# Patient Record
Sex: Female | Born: 1935 | Race: White | Hispanic: No | Smoking: Former smoker
Health system: Southern US, Community
[De-identification: ages and names within clinical notes are randomized; demographics above are authoritative.]

## PROBLEM LIST (undated history)

## (undated) DIAGNOSIS — Z860100 Personal history of colon polyps, unspecified: Secondary | ICD-10-CM

## (undated) DIAGNOSIS — I25810 Atherosclerosis of coronary artery bypass graft(s) without angina pectoris: Secondary | ICD-10-CM

## (undated) DIAGNOSIS — I10 Essential (primary) hypertension: Secondary | ICD-10-CM

## (undated) DIAGNOSIS — E039 Hypothyroidism, unspecified: Secondary | ICD-10-CM

## (undated) DIAGNOSIS — E785 Hyperlipidemia, unspecified: Secondary | ICD-10-CM

## (undated) DIAGNOSIS — K644 Residual hemorrhoidal skin tags: Secondary | ICD-10-CM

## (undated) DIAGNOSIS — K449 Diaphragmatic hernia without obstruction or gangrene: Secondary | ICD-10-CM

## (undated) DIAGNOSIS — K219 Gastro-esophageal reflux disease without esophagitis: Secondary | ICD-10-CM

## (undated) DIAGNOSIS — I4891 Unspecified atrial fibrillation: Secondary | ICD-10-CM

## (undated) DIAGNOSIS — F419 Anxiety disorder, unspecified: Secondary | ICD-10-CM

## (undated) DIAGNOSIS — G4701 Insomnia due to medical condition: Secondary | ICD-10-CM

## (undated) DIAGNOSIS — K2270 Barrett's esophagus without dysplasia: Secondary | ICD-10-CM

## (undated) HISTORY — DX: Essential (primary) hypertension: I10

## (undated) HISTORY — DX: Atherosclerosis of coronary artery bypass graft(s) without angina pectoris: I25.810

## (undated) HISTORY — PX: CAROTID ENDARTERECTOMY: SUR193

## (undated) HISTORY — DX: Unspecified atrial fibrillation: I48.91

## (undated) HISTORY — DX: Gastro-esophageal reflux disease without esophagitis: K21.9

## (undated) HISTORY — DX: Hyperlipidemia, unspecified: E78.5

## (undated) HISTORY — DX: Personal history of colonic polyps: Z86.010

## (undated) HISTORY — DX: Diaphragmatic hernia without obstruction or gangrene: K44.9

## (undated) HISTORY — DX: Insomnia due to medical condition: G47.01

## (undated) HISTORY — DX: Personal history of colon polyps, unspecified: Z86.0100

## (undated) HISTORY — DX: Hypothyroidism, unspecified: E03.9

## (undated) HISTORY — PX: CATARACT EXTRACTION: SUR2

## (undated) HISTORY — DX: Barrett's esophagus without dysplasia: K22.70

## (undated) HISTORY — DX: Anxiety disorder, unspecified: F41.9

## (undated) HISTORY — DX: Residual hemorrhoidal skin tags: K64.4

---

## 1993-04-20 DIAGNOSIS — I25810 Atherosclerosis of coronary artery bypass graft(s) without angina pectoris: Secondary | ICD-10-CM

## 1993-04-20 HISTORY — DX: Atherosclerosis of coronary artery bypass graft(s) without angina pectoris: I25.810

## 1994-01-05 ENCOUNTER — Encounter: Payer: Self-pay | Admitting: Gastroenterology

## 1995-04-21 HISTORY — PX: CORONARY ARTERY BYPASS GRAFT: SHX141

## 1996-02-25 HISTORY — PX: CARDIAC CATHETERIZATION: SHX172

## 1997-12-19 ENCOUNTER — Other Ambulatory Visit: Admission: RE | Admit: 1997-12-19 | Discharge: 1997-12-19 | Payer: Self-pay | Admitting: Gastroenterology

## 1998-04-20 ENCOUNTER — Encounter: Payer: Self-pay | Admitting: Emergency Medicine

## 1998-04-20 ENCOUNTER — Inpatient Hospital Stay (HOSPITAL_COMMUNITY): Admission: EM | Admit: 1998-04-20 | Discharge: 1998-04-26 | Payer: Self-pay | Admitting: Emergency Medicine

## 1998-04-20 ENCOUNTER — Encounter: Payer: Self-pay | Admitting: Internal Medicine

## 1998-10-08 ENCOUNTER — Encounter: Admission: RE | Admit: 1998-10-08 | Discharge: 1998-10-28 | Payer: Self-pay | Admitting: Internal Medicine

## 1998-11-26 ENCOUNTER — Ambulatory Visit (HOSPITAL_COMMUNITY): Admission: RE | Admit: 1998-11-26 | Discharge: 1998-11-26 | Payer: Self-pay | Admitting: Internal Medicine

## 2000-05-14 ENCOUNTER — Emergency Department (HOSPITAL_COMMUNITY): Admission: EM | Admit: 2000-05-14 | Discharge: 2000-05-15 | Payer: Self-pay | Admitting: Internal Medicine

## 2000-06-05 ENCOUNTER — Encounter: Payer: Self-pay | Admitting: Emergency Medicine

## 2000-06-05 ENCOUNTER — Inpatient Hospital Stay (HOSPITAL_COMMUNITY): Admission: EM | Admit: 2000-06-05 | Discharge: 2000-06-09 | Payer: Self-pay | Admitting: Emergency Medicine

## 2000-06-06 ENCOUNTER — Encounter: Payer: Self-pay | Admitting: Pulmonary Disease

## 2000-06-08 ENCOUNTER — Encounter: Payer: Self-pay | Admitting: Endocrinology

## 2000-12-23 ENCOUNTER — Encounter: Admission: RE | Admit: 2000-12-23 | Discharge: 2000-12-23 | Payer: Self-pay | Admitting: Family Medicine

## 2000-12-23 ENCOUNTER — Encounter: Payer: Self-pay | Admitting: Family Medicine

## 2002-06-26 ENCOUNTER — Encounter: Payer: Self-pay | Admitting: Internal Medicine

## 2002-06-26 ENCOUNTER — Encounter: Admission: RE | Admit: 2002-06-26 | Discharge: 2002-06-26 | Payer: Self-pay | Admitting: Internal Medicine

## 2003-05-11 ENCOUNTER — Encounter: Admission: RE | Admit: 2003-05-11 | Discharge: 2003-05-11 | Payer: Self-pay | Admitting: Internal Medicine

## 2003-05-21 ENCOUNTER — Inpatient Hospital Stay (HOSPITAL_COMMUNITY): Admission: EM | Admit: 2003-05-21 | Discharge: 2003-05-22 | Payer: Self-pay | Admitting: Emergency Medicine

## 2003-05-21 HISTORY — PX: CARDIAC CATHETERIZATION: SHX172

## 2004-02-19 ENCOUNTER — Ambulatory Visit: Payer: Self-pay | Admitting: Internal Medicine

## 2004-03-12 ENCOUNTER — Ambulatory Visit: Payer: Self-pay | Admitting: Internal Medicine

## 2004-04-04 ENCOUNTER — Ambulatory Visit: Payer: Self-pay | Admitting: Internal Medicine

## 2004-05-09 ENCOUNTER — Ambulatory Visit: Payer: Self-pay | Admitting: Internal Medicine

## 2004-08-11 ENCOUNTER — Ambulatory Visit: Payer: Self-pay | Admitting: Internal Medicine

## 2004-08-22 ENCOUNTER — Ambulatory Visit: Payer: Self-pay | Admitting: Internal Medicine

## 2004-11-20 ENCOUNTER — Emergency Department (HOSPITAL_COMMUNITY): Admission: EM | Admit: 2004-11-20 | Discharge: 2004-11-20 | Payer: Self-pay | Admitting: Emergency Medicine

## 2004-12-01 ENCOUNTER — Ambulatory Visit: Payer: Self-pay | Admitting: Internal Medicine

## 2005-02-26 ENCOUNTER — Ambulatory Visit: Payer: Self-pay | Admitting: Internal Medicine

## 2005-03-03 ENCOUNTER — Ambulatory Visit: Payer: Self-pay | Admitting: Internal Medicine

## 2005-05-14 ENCOUNTER — Ambulatory Visit: Payer: Self-pay | Admitting: Internal Medicine

## 2005-05-28 ENCOUNTER — Emergency Department (HOSPITAL_COMMUNITY): Admission: EM | Admit: 2005-05-28 | Discharge: 2005-05-28 | Payer: Self-pay | Admitting: Emergency Medicine

## 2005-06-18 HISTORY — PX: COLONOSCOPY W/ POLYPECTOMY: SHX1380

## 2005-06-22 ENCOUNTER — Ambulatory Visit: Payer: Self-pay | Admitting: Gastroenterology

## 2005-07-13 ENCOUNTER — Ambulatory Visit: Payer: Self-pay | Admitting: Gastroenterology

## 2005-07-13 ENCOUNTER — Encounter (INDEPENDENT_AMBULATORY_CARE_PROVIDER_SITE_OTHER): Payer: Self-pay | Admitting: *Deleted

## 2005-08-13 ENCOUNTER — Ambulatory Visit: Payer: Self-pay | Admitting: Internal Medicine

## 2005-11-12 ENCOUNTER — Ambulatory Visit: Payer: Self-pay | Admitting: Internal Medicine

## 2006-01-01 ENCOUNTER — Ambulatory Visit: Payer: Self-pay | Admitting: Internal Medicine

## 2006-01-08 ENCOUNTER — Ambulatory Visit: Payer: Self-pay | Admitting: Internal Medicine

## 2006-02-11 ENCOUNTER — Ambulatory Visit: Payer: Self-pay | Admitting: Internal Medicine

## 2006-03-15 ENCOUNTER — Ambulatory Visit: Payer: Self-pay | Admitting: Internal Medicine

## 2006-04-08 ENCOUNTER — Ambulatory Visit: Payer: Self-pay | Admitting: Internal Medicine

## 2006-05-13 ENCOUNTER — Ambulatory Visit: Payer: Self-pay | Admitting: Internal Medicine

## 2006-05-17 ENCOUNTER — Emergency Department (HOSPITAL_COMMUNITY): Admission: EM | Admit: 2006-05-17 | Discharge: 2006-05-18 | Payer: Self-pay | Admitting: Emergency Medicine

## 2006-06-15 ENCOUNTER — Ambulatory Visit: Payer: Self-pay | Admitting: Internal Medicine

## 2006-08-09 ENCOUNTER — Ambulatory Visit: Payer: Self-pay | Admitting: Internal Medicine

## 2006-08-09 LAB — CONVERTED CEMR LAB
Total Bilirubin: 0.8 mg/dL (ref 0.3–1.2)
Triglycerides: 122 mg/dL (ref 0–149)
VLDL: 24 mg/dL (ref 0–40)

## 2006-08-16 ENCOUNTER — Ambulatory Visit: Payer: Self-pay | Admitting: Internal Medicine

## 2006-10-18 ENCOUNTER — Ambulatory Visit: Payer: Self-pay | Admitting: Internal Medicine

## 2007-01-12 DIAGNOSIS — F411 Generalized anxiety disorder: Secondary | ICD-10-CM | POA: Insufficient documentation

## 2007-01-12 DIAGNOSIS — E785 Hyperlipidemia, unspecified: Secondary | ICD-10-CM | POA: Insufficient documentation

## 2007-01-12 DIAGNOSIS — E039 Hypothyroidism, unspecified: Secondary | ICD-10-CM | POA: Insufficient documentation

## 2007-01-17 ENCOUNTER — Ambulatory Visit: Payer: Self-pay | Admitting: Internal Medicine

## 2007-01-17 DIAGNOSIS — G4700 Insomnia, unspecified: Secondary | ICD-10-CM | POA: Insufficient documentation

## 2007-01-17 DIAGNOSIS — M542 Cervicalgia: Secondary | ICD-10-CM

## 2007-02-16 ENCOUNTER — Ambulatory Visit: Payer: Self-pay | Admitting: Internal Medicine

## 2007-03-15 ENCOUNTER — Ambulatory Visit: Payer: Self-pay | Admitting: Internal Medicine

## 2007-03-15 DIAGNOSIS — M719 Bursopathy, unspecified: Secondary | ICD-10-CM

## 2007-03-15 DIAGNOSIS — M67919 Unspecified disorder of synovium and tendon, unspecified shoulder: Secondary | ICD-10-CM | POA: Insufficient documentation

## 2007-04-05 ENCOUNTER — Encounter: Payer: Self-pay | Admitting: Internal Medicine

## 2007-04-08 ENCOUNTER — Telehealth: Payer: Self-pay | Admitting: Internal Medicine

## 2007-04-22 ENCOUNTER — Encounter: Payer: Self-pay | Admitting: Internal Medicine

## 2007-04-27 ENCOUNTER — Encounter: Payer: Self-pay | Admitting: Internal Medicine

## 2007-05-04 ENCOUNTER — Encounter: Payer: Self-pay | Admitting: Internal Medicine

## 2007-06-13 ENCOUNTER — Ambulatory Visit: Payer: Self-pay | Admitting: Internal Medicine

## 2007-06-13 DIAGNOSIS — M545 Low back pain: Secondary | ICD-10-CM

## 2007-06-22 ENCOUNTER — Encounter: Payer: Self-pay | Admitting: Internal Medicine

## 2007-08-01 ENCOUNTER — Ambulatory Visit: Payer: Self-pay | Admitting: Gastroenterology

## 2007-08-12 ENCOUNTER — Telehealth: Payer: Self-pay | Admitting: Internal Medicine

## 2007-09-08 ENCOUNTER — Ambulatory Visit: Payer: Self-pay | Admitting: Internal Medicine

## 2007-09-08 LAB — CONVERTED CEMR LAB
Free T4: 1.2 ng/dL (ref 0.6–1.6)
T3 Uptake Ratio: 39.1 % — ABNORMAL HIGH (ref 22.5–37.0)
TSH: 0.71 microintl units/mL (ref 0.35–5.50)

## 2007-09-19 ENCOUNTER — Encounter: Payer: Self-pay | Admitting: Internal Medicine

## 2007-09-22 ENCOUNTER — Encounter: Payer: Self-pay | Admitting: Internal Medicine

## 2007-10-05 ENCOUNTER — Telehealth: Payer: Self-pay | Admitting: *Deleted

## 2007-10-20 ENCOUNTER — Encounter: Payer: Self-pay | Admitting: Internal Medicine

## 2007-11-30 ENCOUNTER — Telehealth: Payer: Self-pay | Admitting: Internal Medicine

## 2007-12-08 ENCOUNTER — Ambulatory Visit: Payer: Self-pay | Admitting: Internal Medicine

## 2008-01-20 ENCOUNTER — Ambulatory Visit: Payer: Self-pay | Admitting: Internal Medicine

## 2008-02-12 ENCOUNTER — Emergency Department (HOSPITAL_COMMUNITY): Admission: EM | Admit: 2008-02-12 | Discharge: 2008-02-12 | Payer: Self-pay | Admitting: Emergency Medicine

## 2008-03-07 ENCOUNTER — Ambulatory Visit: Payer: Self-pay | Admitting: Internal Medicine

## 2008-03-07 DIAGNOSIS — S42209A Unspecified fracture of upper end of unspecified humerus, initial encounter for closed fracture: Secondary | ICD-10-CM | POA: Insufficient documentation

## 2008-04-03 ENCOUNTER — Inpatient Hospital Stay (HOSPITAL_COMMUNITY): Admission: EM | Admit: 2008-04-03 | Discharge: 2008-04-06 | Payer: Self-pay | Admitting: Emergency Medicine

## 2008-04-03 ENCOUNTER — Ambulatory Visit: Payer: Self-pay | Admitting: Internal Medicine

## 2008-04-26 ENCOUNTER — Ambulatory Visit: Payer: Self-pay | Admitting: Internal Medicine

## 2008-05-01 ENCOUNTER — Ambulatory Visit: Payer: Self-pay | Admitting: Internal Medicine

## 2008-05-01 DIAGNOSIS — R300 Dysuria: Secondary | ICD-10-CM

## 2008-05-03 ENCOUNTER — Encounter: Payer: Self-pay | Admitting: Internal Medicine

## 2008-05-07 LAB — CONVERTED CEMR LAB
T3, Free: 2.8 pg/mL (ref 2.3–4.2)
TSH: 30.37 microintl units/mL — ABNORMAL HIGH (ref 0.35–5.50)

## 2008-05-15 ENCOUNTER — Encounter: Payer: Self-pay | Admitting: Internal Medicine

## 2008-05-30 ENCOUNTER — Ambulatory Visit: Payer: Self-pay | Admitting: Internal Medicine

## 2008-05-30 LAB — CONVERTED CEMR LAB
Free T4: 0.9 ng/dL (ref 0.6–1.6)
T3, Free: 3.5 pg/mL (ref 2.3–4.2)

## 2008-06-13 ENCOUNTER — Ambulatory Visit: Payer: Self-pay | Admitting: Gastroenterology

## 2008-06-13 DIAGNOSIS — K2270 Barrett's esophagus without dysplasia: Secondary | ICD-10-CM

## 2008-06-20 ENCOUNTER — Ambulatory Visit: Payer: Self-pay | Admitting: Gastroenterology

## 2008-06-20 ENCOUNTER — Encounter: Payer: Self-pay | Admitting: Gastroenterology

## 2008-06-24 ENCOUNTER — Encounter: Payer: Self-pay | Admitting: Gastroenterology

## 2008-06-27 ENCOUNTER — Telehealth: Payer: Self-pay | Admitting: Gastroenterology

## 2008-08-01 ENCOUNTER — Ambulatory Visit: Payer: Self-pay | Admitting: Internal Medicine

## 2008-09-07 ENCOUNTER — Telehealth: Payer: Self-pay | Admitting: Internal Medicine

## 2008-10-29 ENCOUNTER — Ambulatory Visit: Payer: Self-pay | Admitting: Internal Medicine

## 2008-10-29 DIAGNOSIS — R000 Tachycardia, unspecified: Secondary | ICD-10-CM

## 2008-11-16 ENCOUNTER — Ambulatory Visit: Payer: Self-pay | Admitting: Family Medicine

## 2008-11-22 ENCOUNTER — Encounter: Payer: Self-pay | Admitting: Internal Medicine

## 2009-01-02 ENCOUNTER — Encounter: Payer: Self-pay | Admitting: Internal Medicine

## 2009-01-10 ENCOUNTER — Encounter: Payer: Self-pay | Admitting: Internal Medicine

## 2009-01-29 ENCOUNTER — Ambulatory Visit: Payer: Self-pay | Admitting: Internal Medicine

## 2009-01-31 ENCOUNTER — Telehealth: Payer: Self-pay | Admitting: Internal Medicine

## 2009-02-20 ENCOUNTER — Telehealth: Payer: Self-pay | Admitting: Internal Medicine

## 2009-02-25 ENCOUNTER — Ambulatory Visit: Payer: Self-pay | Admitting: Internal Medicine

## 2009-03-11 ENCOUNTER — Telehealth: Payer: Self-pay | Admitting: Internal Medicine

## 2009-04-08 ENCOUNTER — Ambulatory Visit: Payer: Self-pay | Admitting: Internal Medicine

## 2009-05-10 ENCOUNTER — Telehealth: Payer: Self-pay | Admitting: Internal Medicine

## 2009-05-13 ENCOUNTER — Encounter: Payer: Self-pay | Admitting: Internal Medicine

## 2009-06-04 ENCOUNTER — Encounter: Payer: Self-pay | Admitting: Internal Medicine

## 2009-07-08 ENCOUNTER — Ambulatory Visit: Payer: Self-pay | Admitting: Internal Medicine

## 2009-07-08 DIAGNOSIS — B029 Zoster without complications: Secondary | ICD-10-CM | POA: Insufficient documentation

## 2009-08-08 ENCOUNTER — Emergency Department (HOSPITAL_COMMUNITY): Admission: EM | Admit: 2009-08-08 | Discharge: 2009-08-08 | Payer: Self-pay | Admitting: Family Medicine

## 2009-08-08 ENCOUNTER — Telehealth: Payer: Self-pay | Admitting: Internal Medicine

## 2009-08-23 ENCOUNTER — Telehealth: Payer: Self-pay | Admitting: Internal Medicine

## 2009-10-07 ENCOUNTER — Ambulatory Visit: Payer: Self-pay | Admitting: Internal Medicine

## 2009-10-07 DIAGNOSIS — R350 Frequency of micturition: Secondary | ICD-10-CM

## 2009-10-07 LAB — CONVERTED CEMR LAB
Cholesterol, target level: 200 mg/dL
Cholesterol: 139 mg/dL (ref 0–200)
LDL Goal: 100 mg/dL

## 2009-10-08 ENCOUNTER — Encounter: Payer: Self-pay | Admitting: Internal Medicine

## 2009-10-11 ENCOUNTER — Telehealth: Payer: Self-pay | Admitting: Internal Medicine

## 2009-10-14 ENCOUNTER — Encounter: Payer: Self-pay | Admitting: Internal Medicine

## 2009-10-17 ENCOUNTER — Telehealth: Payer: Self-pay | Admitting: Internal Medicine

## 2009-11-14 ENCOUNTER — Telehealth: Payer: Self-pay | Admitting: Internal Medicine

## 2009-12-04 ENCOUNTER — Encounter: Payer: Self-pay | Admitting: Internal Medicine

## 2009-12-11 ENCOUNTER — Telehealth: Payer: Self-pay | Admitting: Internal Medicine

## 2010-01-08 ENCOUNTER — Ambulatory Visit: Payer: Self-pay | Admitting: Internal Medicine

## 2010-01-16 ENCOUNTER — Telehealth: Payer: Self-pay | Admitting: Internal Medicine

## 2010-01-23 ENCOUNTER — Telehealth: Payer: Self-pay | Admitting: Internal Medicine

## 2010-01-27 ENCOUNTER — Encounter: Payer: Self-pay | Admitting: Internal Medicine

## 2010-01-28 ENCOUNTER — Ambulatory Visit: Payer: Self-pay | Admitting: Family Medicine

## 2010-01-28 DIAGNOSIS — R05 Cough: Secondary | ICD-10-CM | POA: Insufficient documentation

## 2010-01-28 DIAGNOSIS — R059 Cough, unspecified: Secondary | ICD-10-CM | POA: Insufficient documentation

## 2010-01-28 DIAGNOSIS — J209 Acute bronchitis, unspecified: Secondary | ICD-10-CM | POA: Insufficient documentation

## 2010-01-29 ENCOUNTER — Telehealth (INDEPENDENT_AMBULATORY_CARE_PROVIDER_SITE_OTHER): Payer: Self-pay | Admitting: *Deleted

## 2010-05-06 ENCOUNTER — Ambulatory Visit
Admission: RE | Admit: 2010-05-06 | Discharge: 2010-05-06 | Payer: Self-pay | Source: Home / Self Care | Attending: Internal Medicine | Admitting: Internal Medicine

## 2010-05-06 ENCOUNTER — Encounter: Payer: Self-pay | Admitting: Internal Medicine

## 2010-05-06 ENCOUNTER — Telehealth: Payer: Self-pay | Admitting: Internal Medicine

## 2010-05-06 DIAGNOSIS — N390 Urinary tract infection, site not specified: Secondary | ICD-10-CM | POA: Insufficient documentation

## 2010-05-06 LAB — CONVERTED CEMR LAB
Bilirubin Urine: NEGATIVE
Ketones, urine, test strip: NEGATIVE
Protein, U semiquant: NEGATIVE
pH: 6

## 2010-05-07 ENCOUNTER — Encounter: Payer: Self-pay | Admitting: Internal Medicine

## 2010-05-12 ENCOUNTER — Ambulatory Visit
Admission: RE | Admit: 2010-05-12 | Discharge: 2010-05-12 | Payer: Self-pay | Source: Home / Self Care | Attending: Internal Medicine | Admitting: Internal Medicine

## 2010-05-15 ENCOUNTER — Encounter: Payer: Self-pay | Admitting: Internal Medicine

## 2010-05-15 HISTORY — PX: OTHER SURGICAL HISTORY: SHX169

## 2010-05-22 NOTE — Assessment & Plan Note (Signed)
Summary: 3 month fup/ccm   Vital Signs:  Patient profile:   77 year old female Height:      64 inches Weight:      150 pounds BMI:     25.84 Temp:     98.2 degrees F oral Pulse rate:   72 / minute Resp:     14 per minute BP sitting:   134 / 80  (left arm)  Vitals Entered By: Willy Eddy, LPN (October 07, 2009 2:58 PM) CC: roa, Hypertension Management, Lipid Management   Primary Care Provider:  Darryll Capers, MD  CC:  roa, Hypertension Management, and Lipid Management.  History of Present Illness: no chest pains or dizziness blood pressure is stable arthritis is stable but has chronic low back pain mild abdominal pain with bend increased urgency and nocturia 2-3 times at night  Hypertension History:      She denies headache, chest pain, palpitations, dyspnea with exertion, orthopnea, PND, peripheral edema, visual symptoms, neurologic problems, syncope, and side effects from treatment.        Positive major cardiovascular risk factors include female age 21 years old or older, hyperlipidemia, and hypertension.  Negative major cardiovascular risk factors include non-tobacco-user status.        Positive history for target organ damage include ASHD (either angina/prior MI/prior CABG).  Further assessment for target organ damage reveals no history of stroke/TIA or peripheral vascular disease.    Lipid Management History:      Positive NCEP/ATP III risk factors include female age 65 years old or older, HDL cholesterol less than 40, hypertension, and ASHD (either angina/prior MI/prior CABG).  Negative NCEP/ATP III risk factors include non-tobacco-user status, no prior stroke/TIA, no peripheral vascular disease, and no history of aortic aneurysm.      Preventive Screening-Counseling & Management  Alcohol-Tobacco     Smoking Status: never  Problems Prior to Update: 1)  Shingles, Recurrent  (ICD-053.9) 2)  Tachycardia  (ICD-785.0) 3)  Barrett's Esophagus  (ICD-530.85) 4)   Dysuria  (ICD-788.1) 5)  Closed Fracture Unspec Part Upper End Humerus  (ICD-812.00) 6)  Low Back Pain  (ICD-724.2) 7)  Bursitis, Acromioclavicular, Right  (ICD-726.10) 8)  Insomnia, Chronic  (ICD-307.42) 9)  Neck Pain, Right  (ICD-723.1) 10)  Hypothyroidism  (ICD-244.9) 11)  Hypertension  (ICD-401.9) 12)  Hyperlipidemia  (ICD-272.4) 13)  Anxiety  (ICD-300.00)  Current Problems (verified): 1)  Shingles, Recurrent  (ICD-053.9) 2)  Tachycardia  (ICD-785.0) 3)  Barrett's Esophagus  (ICD-530.85) 4)  Dysuria  (ICD-788.1) 5)  Closed Fracture Unspec Part Upper End Humerus  (ICD-812.00) 6)  Low Back Pain  (ICD-724.2) 7)  Bursitis, Acromioclavicular, Right  (ICD-726.10) 8)  Insomnia, Chronic  (ICD-307.42) 9)  Neck Pain, Right  (ICD-723.1) 10)  Hypothyroidism  (ICD-244.9) 11)  Hypertension  (ICD-401.9) 12)  Hyperlipidemia  (ICD-272.4) 13)  Anxiety  (ICD-300.00)  Medications Prior to Update: 1)  Prilosec 20 Mg  Cpdr (Omeprazole) .... Once Daily 2)  Lipitor 80 Mg  Tabs (Atorvastatin Calcium) .... At Bedtime 3)  Synthroid 88 Mcg  Tabs (Levothyroxine Sodium) .Marland Kitchen.. 1 Tab Before Breakfast 4)  Adult Aspirin Ec Low Strength 81 Mg  Tbec (Aspirin) .... Once Daily 5)  Klonopin 1 Mg  Tabs (Clonazepam) .... 1/2 Po Q 6 Pm and Then One Whole Klonopin At Bedtime 6)  Azor 10-40 Mg  Tabs (Amlodipine-Olmesartan) .... 1/2 By Mouth Daily 7)  Calcium 600/vitamin D 600-200 Mg-Unit  Tabs (Calcium Carbonate-Vitamin D) .... Once Daily 8)  Vitamin B-12  500 Mcg  Tabs (Cyanocobalamin) .... Once Daily 9)  Bl Vitamin E 400 Unit  Caps (Vitamin E) .... Once Daily 10)  Therapeutic Multivitamin   Tabs (Multiple Vitamin) .... Once Daily 11)  Remeron 30 Mg Tabs (Mirtazapine) .... One By Mouth Q Hs 12)  Bisoprolol Fumarate 5 Mg Tabs (Bisoprolol Fumarate) .... One By Mouth Daily 13)  Seroquel 50 Mg Tabs (Quetiapine Fumarate) .... One By Mouth Q Hs  Current Medications (verified): 1)  Prilosec 20 Mg  Cpdr (Omeprazole) ....  Once Daily 2)  Lipitor 80 Mg  Tabs (Atorvastatin Calcium) .... At Bedtime 3)  Synthroid 88 Mcg  Tabs (Levothyroxine Sodium) .Marland Kitchen.. 1 Tab Before Breakfast 4)  Adult Aspirin Ec Low Strength 81 Mg  Tbec (Aspirin) .... Once Daily 5)  Klonopin 1 Mg  Tabs (Clonazepam) .... 1/2 Po Q 6 Pm and Then One Whole Klonopin At Bedtime 6)  Azor 10-40 Mg  Tabs (Amlodipine-Olmesartan) .... 1/2 By Mouth Daily 7)  Calcium 600/vitamin D 600-200 Mg-Unit  Tabs (Calcium Carbonate-Vitamin D) .... Once Daily 8)  Vitamin B-12 500 Mcg  Tabs (Cyanocobalamin) .... Once Daily 9)  Bl Vitamin E 400 Unit  Caps (Vitamin E) .... Once Daily 10)  Therapeutic Multivitamin   Tabs (Multiple Vitamin) .... Once Daily 11)  Remeron 30 Mg Tabs (Mirtazapine) .... One By Mouth Q Hs 12)  Bisoprolol Fumarate 5 Mg Tabs (Bisoprolol Fumarate) .... One By Mouth Daily 13)  Seroquel 50 Mg Tabs (Quetiapine Fumarate) .... One By Mouth Q Hs  Rx Number [id number]  Allergies (verified): 1)  ! Demerol 2)  ! Tylox 3)  ! Codeine  Past History:  Family History: Last updated: 06/13/2008 Family History High cholesterol Family History Hypertension No FH of Colon Cancer: Family History of Diabetes: Son  Social History: Last updated: 06/13/2008 Married Never Smoked widowed Alcohol Use - no Daily Caffeine Use 1 can Pepsi in AM Illicit Drug Use - no  Risk Factors: Smoking Status: never (10/07/2009)  Past medical, surgical, family and social histories (including risk factors) reviewed, and no changes noted (except as noted below).  Past Medical History: Reviewed history from 06/13/2008 and no changes required. Insomnia Anxiety Hyperlipidemia Hypertension Hypothyroidism Low back pain L wrist fracture, 09/2007 R shoulder fracture, 02/2008 GERD Barretts Esophagus Hemorrhoids Coronary Artery Disease Hiatal hernia Hyperplastic colon polyps 06/2005  Past Surgical History: Reviewed history from 01/17/2007 and no changes required. Coronary  artery bypass graft  1995 Carotid endarterectomy Cataract extraction  Family History: Reviewed history from 06/13/2008 and no changes required. Family History High cholesterol Family History Hypertension No FH of Colon Cancer: Family History of Diabetes: Son  Social History: Reviewed history from 06/13/2008 and no changes required. Married Never Smoked widowed Alcohol Use - no Daily Caffeine Use 1 can Pepsi in AM Illicit Drug Use - no  Review of Systems  The patient denies anorexia, fever, weight loss, weight gain, vision loss, decreased hearing, hoarseness, chest pain, syncope, dyspnea on exertion, peripheral edema, prolonged cough, headaches, hemoptysis, abdominal pain, melena, hematochezia, severe indigestion/heartburn, hematuria, incontinence, genital sores, muscle weakness, suspicious skin lesions, transient blindness, difficulty walking, depression, unusual weight change, abnormal bleeding, enlarged lymph nodes, angioedema, and breast masses.    Physical Exam  General:  patient is alert and cooperative and in no distress Head:  Normocephalic and atraumatic. Eyes:  pupils equal and pupils round.   Ears:  R ear normal and L ear normal.   Nose:  minimal clear rhinorrhea Mouth:  Oral mucosa and oropharynx without  lesions or exudates.  Teeth in good repair. Neck:  supple Lungs:  clear to auscultationno wheezes.   Heart:  normal rate and regular rhythm.   Abdomen:  soft and non-tender.     Impression & Recommendations:  Problem # 1:  DYSURIA (ICD-788.1)  Encouraged to push clear liquids, get enough rest, and take acetaminophen as needed. To be seen in 10 days if no improvement, sooner if worse.  Orders: Specimen Handling (16109) UA Dipstick w/o Micro (automated)  (81003)  Problem # 2:  SHINGLES, RECURRENT (ICD-053.9) review of blood work  Problem # 3:  HYPERTENSION (ICD-401.9)  Her updated medication list for this problem includes:    Azor 10-40 Mg Tabs  (Amlodipine-olmesartan) .Marland Kitchen... 1/2 by mouth daily    Bisoprolol Fumarate 5 Mg Tabs (Bisoprolol fumarate) ..... One by mouth daily  BP today: 134/80 Prior BP: 130/64 (07/08/2009)  Prior 10 Yr Risk Heart Disease: N/A (08/01/2008)  Labs Reviewed: Chol: 124 (08/09/2006)   HDL: 36.5 (08/09/2006)   LDL: 63 (08/09/2006)   TG: 122 (08/09/2006)  Problem # 4:  HYPOTHYROIDISM (ICD-244.9)  Her updated medication list for this problem includes:    Synthroid 88 Mcg Tabs (Levothyroxine sodium) .Marland Kitchen... 1 tab before breakfast  Labs Reviewed: TSH: 5.62 (05/30/2008)   Free T4: 0.9 (05/30/2008)    Chol: 124 (08/09/2006)   HDL: 36.5 (08/09/2006)   LDL: 63 (08/09/2006)   TG: 122 (08/09/2006)  Orders: TLB-TSH (Thyroid Stimulating Hormone) (84443-TSH) Venipuncture (60454)  Problem # 5:  HYPERLIPIDEMIA (ICD-272.4) Assessment: Unchanged  Her updated medication list for this problem includes:    Lipitor 80 Mg Tabs (Atorvastatin calcium) .Marland Kitchen... At bedtime  Orders: TLB-Cholesterol, HDL (83718-HDL) TLB-Cholesterol, Direct LDL (83721-DIRLDL) TLB-Cholesterol, Total (82465-CHO)  Labs Reviewed: SGOT: 26 (08/09/2006)   SGPT: 12 (08/09/2006)  Lipid Goals: Chol Goal: 200 (10/07/2009)   HDL Goal: 40 (10/07/2009)   LDL Goal: 100 (10/07/2009)   TG Goal: 150 (10/07/2009)  Prior 10 Yr Risk Heart Disease: N/A (08/01/2008)   HDL:36.5 (08/09/2006)  LDL:63 (08/09/2006)  Chol:124 (08/09/2006)  Trig:122 (08/09/2006)  Problem # 6:  INSOMNIA, CHRONIC (ICD-307.42)  Complete Medication List: 1)  Prilosec 20 Mg Cpdr (Omeprazole) .... Once daily 2)  Lipitor 80 Mg Tabs (Atorvastatin calcium) .... At bedtime 3)  Synthroid 88 Mcg Tabs (Levothyroxine sodium) .Marland Kitchen.. 1 tab before breakfast 4)  Adult Aspirin Ec Low Strength 81 Mg Tbec (Aspirin) .... Once daily 5)  Klonopin 1 Mg Tabs (Clonazepam) .... 1/2 po q 6 pm and then one whole klonopin at bedtime 6)  Azor 10-40 Mg Tabs (Amlodipine-olmesartan) .... 1/2 by mouth daily 7)   Calcium 600/vitamin D 600-200 Mg-unit Tabs (Calcium carbonate-vitamin d) .... Once daily 8)  Vitamin B-12 500 Mcg Tabs (Cyanocobalamin) .... Once daily 9)  Bl Vitamin E 400 Unit Caps (Vitamin e) .... Once daily 10)  Therapeutic Multivitamin Tabs (Multiple vitamin) .... Once daily 11)  Remeron 30 Mg Tabs (Mirtazapine) .... One by mouth q hs 12)  Bisoprolol Fumarate 5 Mg Tabs (Bisoprolol fumarate) .... One by mouth daily 13)  Seroquel 50 Mg Tabs (Quetiapine fumarate) .... One by mouth q hs  rx number [id number]  Hypertension Assessment/Plan:      The patient's hypertensive risk group is category C: Target organ damage and/or diabetes.  Today's blood pressure is 134/80.  Her blood pressure goal is < 140/90.  Lipid Assessment/Plan:      Based on NCEP/ATP III, the patient's risk factor category is "history of coronary disease, peripheral vascular disease, cerebrovascular disease,  or aortic aneurysm".  The patient's lipid goals are as follows: Total cholesterol goal is 200; LDL cholesterol goal is 100; HDL cholesterol goal is 40; Triglyceride goal is 150.    Patient Instructions: 1)  Please schedule a follow-up appointment in 3 months. Prescriptions: SEROQUEL 50 MG TABS (QUETIAPINE FUMARATE) one by mouth q HS  rx number [id number]  #90 x 3   Entered and Authorized by:   Stacie Glaze MD   Signed by:   Stacie Glaze MD on 10/07/2009   Method used:   Faxed to ...       [facility])       [address] [address]       [place], [place]  [address]       Ph: [phone]       Fax: [phone]   RxID:   [id number]   Appended Document: Orders Update    Clinical Lists Changes  Medications: Added new medication of CIPRO 250 MG TABS (CIPROFLOXACIN HCL) 1 two times a day for 10 days - Signed Rx of CIPRO 250 MG TABS (CIPROFLOXACIN HCL) 1 two times a day for 10 days;  #20 x 0;  Signed;  Entered by: Willy Eddy, LPN;  Authorized by: Stacie Glaze MD;  Method used: Electronically to [facility]  [facility] [id number] [place], [place], [place]  [address], Ph: [phone], Fax: [phone] Orders: Added new Test order of T-Culture, Urine [id number]) - Signed Observations: Added new observation of COMMENTS: Wynona Canes, CMA  October 07, 2009 3:42 PM  (10/07/2009 15:41) Added new observation of PH URINE: 5.0  (10/07/2009 15:41) Added new observation of SPEC GR URIN: <1.005  (10/07/2009 15:41) Added new observation of APPEARANCE U: Clear  (10/07/2009 15:41) Added new observation of UA COLOR: yellow  (10/07/2009 15:41) Added new observation of WBC DIPSTK U: 2+  (10/07/2009 15:41) Added new observation of NITRITE URN: positive  (10/07/2009 15:41) Added new observation of UROBILINOGEN: 0.2  (10/07/2009 15:41) Added new observation of PROTEIN, URN: negative  (10/07/2009 15:41) Added new observation of BLOOD UR DIP: trace-lysed  (10/07/2009 15:41) Added new observation of KETONES URN: negative  (10/07/2009 15:41) Added new observation of BILIRUBIN UR: negative  (10/07/2009 15:41) Added new observation of GLUCOSE, URN: negative  (10/07/2009 15:41)    Prescriptions: CIPRO 250 MG TABS (CIPROFLOXACIN HCL) 1 two times a day for 10 days  #20 x 0   Entered by:   Willy Eddy, LPN   Authorized by:   Stacie Glaze MD   Signed by:   Willy Eddy, LPN on 01/60/1093   Method used:   Electronically to        [name] [id number] (retail)       [address]       [place], [place]  [address]       Ph: [phone]       Fax: [phone]   RxID:   [id number]    Laboratory Results   Urine Tests  Date/Time Recieved: October 07, 2009 3:42 PM  Date/Time Reported: October 07, 2009 3:42 PM   Routine Urinalysis   Color: yellow Appearance: Clear Glucose: negative   (Normal Range: Negative) Bilirubin: negative   (Normal Range: Negative) Ketone: negative   (Normal Range: Negative) Spec. Gravity: <1.005   (Normal Range:  1.003-1.035) Blood: trace-lysed   (Normal Range: Negative) pH: 5.0   (Normal Range:  5.0-8.0) Protein: negative   (Normal Range: Negative) Urobilinogen: 0.2   (Normal Range: 0-1) Nitrite: positive   (Normal Range: Negative) Leukocyte Esterace: 2+   (Normal Range: Negative)    Comments: Wynona Canes, CMA  October 07, 2009 3:42 PM

## 2010-05-22 NOTE — Progress Notes (Signed)
Summary: refill  Phone Note Refill Request Call back at Home Phone [phone] Message from:  daughter---live call  Refills Requested: Medication #1:  AZOR 10-40 MG  TABS 1/2 by mouth daily [facility]  Initial call taken by: Warnell Forester,  January 23, 2010 1:02 PM    Prescriptions: AZOR 10-40 MG  TABS (AMLODIPINE-OLMESARTAN) 1/2 by mouth daily  #45 x 3   Entered by:   Willy Eddy, LPN   Authorized by:   Stacie Glaze MD   Signed by:   Willy Eddy, LPN on 14/78/2956   Method used:   Electronically to        [facility] [address] (mail-order)       [facility]       [address]       [place], [place]  [address]       Ph: [phone]       Fax: [phone]   RxID:   [id number]

## 2010-05-22 NOTE — Medication Information (Signed)
Summary: Formulary Alternative Request Form-Azor  Formulary Alternative Request Form-Azor   Imported By: Maryln Gottron 01/30/2010 10:58:17  _____________________________________________________________________  External Attachment:    Type:   Image     Comment:   External Document

## 2010-05-22 NOTE — Progress Notes (Signed)
Summary: refill today please  Phone Note Refill Request Call back at Home Phone [phone] Message from:  Patient--live call  Refills Requested: Medication #1:  KLONOPIN 1 MG  TABS 1/2 po q 6 pm and then one whole klonopin at bedtime   Brand Name Necessary? No Need 30 day supply until mail order comes in. [name]  Initial call taken by: Warnell Forester,  October 17, 2009 2:31 PM    Prescriptions: KLONOPIN 1 MG  TABS (CLONAZEPAM) 1/2 po q 6 pm and then one whole klonopin at bedtime  #45 x 3   Entered by:   Willy Eddy, LPN   Authorized by:   Stacie Glaze MD   Signed by:   Willy Eddy, LPN on 09/81/1914   Method used:   Telephoned to ...       [name] [id number] (retail)       [address] [place], [place]  [address]       Ph: [phone]       Fax: [phone]   RxID:   [id number]

## 2010-05-22 NOTE — Progress Notes (Signed)
Summary: refill  Phone Note Call from Patient Call back at Home Phone [phone]   Caller: Patient-live call Reason for Call: Refill Medication Complaint: Breathing Problems Summary of Call: refill generic remeron. [facility]. Initial call taken by: Warnell Forester,  May 10, 2009 2:48 PM    Prescriptions: REMERON 30 MG TABS (MIRTAZAPINE) one by mouth q HS  #90 x 3   Entered by:   Willy Eddy, LPN   Authorized by:   Stacie Glaze MD   Signed by:   Willy Eddy, LPN on 09/81/1914   Method used:   Faxed to ...       [facility]             , [place]         Ph: [phone]       Fax: [phone]   RxID:   [id number]

## 2010-05-22 NOTE — Medication Information (Signed)
Summary: Clonazepam Approved  Clonazepam Approved   Imported By: Maryln Gottron 10/24/2009 15:46:27  _____________________________________________________________________  External Attachment:    Type:   Image     Comment:   External Document

## 2010-05-22 NOTE — Assessment & Plan Note (Signed)
Summary: 3 MTH ROV // RS   Vital Signs:  Patient profile:   77 year old female Height:      64 inches Weight:      152 pounds BMI:     26.19 Temp:     98.2 degrees F oral Pulse rate:   721 / minute Resp:     14 per minute BP sitting:   122 / 74  (left arm)  Vitals Entered By: Willy Eddy, LPN (January 08, 2010 2:22 PM) CC: roa, Hypertension Management, Lipid Management Is Patient Diabetic? No   Primary Care Provider:  Darryll Capers, MD  CC:  roa, Hypertension Management, and Lipid Management.  History of Present Illness: the pt was seen by cardiology at [facility] and has an EKG and review of medications  Got flu shot and pneumonia shot today review of HTN and Cad AS WELL AS LIPIDS lIPIDS ARE AT GOAL  Hypertension History:      She denies headache, chest pain, palpitations, dyspnea with exertion, orthopnea, PND, peripheral edema, visual symptoms, neurologic problems, syncope, and side effects from treatment.        Positive major cardiovascular risk factors include female age 15 years old or older, hyperlipidemia, and hypertension.  Negative major cardiovascular risk factors include non-tobacco-user status.        Positive history for target organ damage include ASHD (either angina/prior MI/prior CABG).  Further assessment for target organ damage reveals no history of stroke/TIA or peripheral vascular disease.    Lipid Management History:      Positive NCEP/ATP III risk factors include female age 15 years old or older, hypertension, and ASHD (either angina/prior MI/prior CABG).  Negative NCEP/ATP III risk factors include non-tobacco-user status, no prior stroke/TIA, no peripheral vascular disease, and no history of aortic aneurysm.      Preventive Screening-Counseling & Management  Alcohol-Tobacco     Smoking Status: never     Tobacco Counseling: not indicated; no tobacco use  Problems Prior to Update: 1)  Frequency, Urinary  (ICD-788.41) 2)  Shingles,  Recurrent  (ICD-053.9) 3)  Tachycardia  (ICD-785.0) 4)  Barrett's Esophagus  (ICD-530.85) 5)  Dysuria  (ICD-788.1) 6)  Closed Fracture Unspec Part Upper End Humerus  (ICD-812.00) 7)  Low Back Pain  (ICD-724.2) 8)  Bursitis, Acromioclavicular, Right  (ICD-726.10) 9)  Insomnia, Chronic  (ICD-307.42) 10)  Neck Pain, Right  (ICD-723.1) 11)  Hypothyroidism  (ICD-244.9) 12)  Hypertension  (ICD-401.9) 13)  Hyperlipidemia  (ICD-272.4) 14)  Anxiety  (ICD-300.00)  Current Problems (verified): 1)  Frequency, Urinary  (ICD-788.41) 2)  Shingles, Recurrent  (ICD-053.9) 3)  Tachycardia  (ICD-785.0) 4)  Barrett's Esophagus  (ICD-530.85) 5)  Dysuria  (ICD-788.1) 6)  Closed Fracture Unspec Part Upper End Humerus  (ICD-812.00) 7)  Low Back Pain  (ICD-724.2) 8)  Bursitis, Acromioclavicular, Right  (ICD-726.10) 9)  Insomnia, Chronic  (ICD-307.42) 10)  Neck Pain, Right  (ICD-723.1) 11)  Hypothyroidism  (ICD-244.9) 12)  Hypertension  (ICD-401.9) 13)  Hyperlipidemia  (ICD-272.4) 14)  Anxiety  (ICD-300.00)  Medications Prior to Update: 1)  Prilosec 20 Mg  Cpdr (Omeprazole) .... Once Daily 2)  Lipitor 80 Mg  Tabs (Atorvastatin Calcium) .... At Bedtime 3)  Synthroid 88 Mcg  Tabs (Levothyroxine Sodium) .Marland Kitchen.. 1 Tab Before Breakfast 4)  Adult Aspirin Ec Low Strength 81 Mg  Tbec (Aspirin) .... Once Daily 5)  Klonopin 1 Mg  Tabs (Clonazepam) .... 1/2 Po Q 6 Pm and Then One Whole Klonopin At Bedtime 6)  Azor 10-40 Mg  Tabs (Amlodipine-Olmesartan) .... 1/2 By Mouth Daily 7)  Calcium 600/vitamin D 600-200 Mg-Unit  Tabs (Calcium Carbonate-Vitamin D) .... Once Daily 8)  Vitamin B-12 500 Mcg  Tabs (Cyanocobalamin) .... Once Daily 9)  Bl Vitamin E 400 Unit  Caps (Vitamin E) .... Once Daily 10)  Therapeutic Multivitamin   Tabs (Multiple Vitamin) .... Once Daily 11)  Remeron 30 Mg Tabs (Mirtazapine) .... One By Mouth Q Hs 12)  Bisoprolol Fumarate 5 Mg Tabs (Bisoprolol Fumarate) .... One By Mouth Daily 13)  Seroquel  50 Mg Tabs (Quetiapine Fumarate) .... One By Mouth Q Hs  Rx Number [id number] 14)  Cipro 250 Mg Tabs (Ciprofloxacin Hcl) .Marland Kitchen.. 1 Once Daily  For 30 Days 15)  Zithromax Z-Pak 250 Mg Tabs (Azithromycin) .... Take As Directed 16)  Atuss Ds 30-4-30 Mg/83ml Susp (Pseudoephed Hcl-Cpm-Dm Hbr Tan) .... 2 Tsp Every 12 Hours  Current Medications (verified): 1)  Prilosec 20 Mg  Cpdr (Omeprazole) .... Once Daily 2)  Lipitor 80 Mg  Tabs (Atorvastatin Calcium) .... At Bedtime 3)  Synthroid 88 Mcg  Tabs (Levothyroxine Sodium) .Marland Kitchen.. 1 Tab Before Breakfast 4)  Adult Aspirin Ec Low Strength 81 Mg  Tbec (Aspirin) .... Once Daily 5)  Klonopin 1 Mg  Tabs (Clonazepam) .... 1/2 Po Q 6 Pm and Then One Whole Klonopin At Bedtime 6)  Azor 10-40 Mg  Tabs (Amlodipine-Olmesartan) .... 1/2 By Mouth Daily 7)  Calcium 600/vitamin D 600-200 Mg-Unit  Tabs (Calcium Carbonate-Vitamin D) .... Once Daily 8)  Vitamin B-12 500 Mcg  Tabs (Cyanocobalamin) .... Once Daily 9)  Bl Vitamin E 400 Unit  Caps (Vitamin E) .... Once Daily 10)  Therapeutic Multivitamin   Tabs (Multiple Vitamin) .... Once Daily 11)  Remeron 30 Mg Tabs (Mirtazapine) .... One By Mouth Q Hs 12)  Bisoprolol Fumarate 5 Mg Tabs (Bisoprolol Fumarate) .... One By Mouth Daily 13)  Seroquel 50 Mg Tabs (Quetiapine Fumarate) .... One By Mouth Q Hs  Rx Number [id number]  Allergies (verified): 1)  ! Demerol 2)  ! Tylox 3)  ! Codeine  Past History:  Family History: Last updated: 06/13/2008 Family History High cholesterol Family History Hypertension No FH of Colon Cancer: Family History of Diabetes: Son  Social History: Last updated: 06/13/2008 Married Never Smoked widowed Alcohol Use - no Daily Caffeine Use 1 can Pepsi in AM Illicit Drug Use - no  Risk Factors: Smoking Status: never (01/08/2010)  Past medical, surgical, family and social histories (including risk factors) reviewed, and no changes noted (except as noted below).  Past Medical  History: Reviewed history from 06/13/2008 and no changes required. Insomnia Anxiety Hyperlipidemia Hypertension Hypothyroidism Low back pain L wrist fracture, 09/2007 R shoulder fracture, 02/2008 GERD Barretts Esophagus Hemorrhoids Coronary Artery Disease Hiatal hernia Hyperplastic colon polyps 06/2005  Past Surgical History: Reviewed history from 01/17/2007 and no changes required. Coronary artery bypass graft  1995 Carotid endarterectomy Cataract extraction  Family History: Reviewed history from 06/13/2008 and no changes required. Family History High cholesterol Family History Hypertension No FH of Colon Cancer: Family History of Diabetes: Son  Social History: Reviewed history from 06/13/2008 and no changes required. Married Never Smoked widowed Alcohol Use - no Daily Caffeine Use 1 can Pepsi in AM Illicit Drug Use - no  Review of Systems  The patient denies anorexia, fever, weight loss, weight gain, vision loss, decreased hearing, hoarseness, chest pain, syncope, dyspnea on exertion, peripheral edema, prolonged cough, headaches, hemoptysis, abdominal pain, melena, hematochezia, severe indigestion/heartburn, hematuria,  incontinence, genital sores, muscle weakness, suspicious skin lesions, transient blindness, difficulty walking, depression, unusual weight change, abnormal bleeding, enlarged lymph nodes, angioedema, and breast masses.         Flu Vaccine Consent Questions     Do you have a history of severe allergic reactions to this vaccine? no    Any prior history of allergic reactions to egg and/or gelatin? no    Do you have a sensitivity to the preservative Thimersol? no    Do you have a past history of Guillan-Barre Syndrome? no    Do you currently have an acute febrile illness? no    Have you ever had a severe reaction to latex? no    Vaccine information given and explained to patient? yes    Are you currently pregnant? no    Lot Number:[id number]   Exp  Date:10/18/2010   Site Given  Left Deltoid IM   Physical Exam  General:  patient is alert and cooperative and in no distress Head:  Normocephalic and atraumatic. Eyes:  pupils equal and pupils round.   Ears:  R ear normal and L ear normal.   Nose:  minimal clear rhinorrhea Mouth:  Oral mucosa and oropharynx without lesions or exudates.  Teeth in good repair. Neck:  supple Lungs:  clear to auscultationno wheezes.   Heart:  normal rate and regular rhythm.   Abdomen:  soft and non-tender.   Msk:  decreased ROM, joint tenderness, and joint swelling.   Extremities:  No clubbing, cyanosis, edema, or deformity noted with normal full range of motion of all joints.   Neurologic:  alert & oriented X3 and gait normal.     Impression & Recommendations:  Problem # 1:  BARRETT'S ESOPHAGUS (ICD-530.85) Assessment Unchanged stable no dyspahgia  Problem # 2:  HYPOTHYROIDISM (ICD-244.9) Assessment: Unchanged  Her updated medication list for this problem includes:    Synthroid 88 Mcg Tabs (Levothyroxine sodium) .Marland Kitchen... 1 tab before breakfast  Labs Reviewed: TSH: 1.28 (10/07/2009)   Free T4: 0.9 (05/30/2008)    Chol: 139 (10/07/2009)   HDL: 46.50 (10/07/2009)   LDL: 63 (08/09/2006)   TG: 122 (08/09/2006)  Problem # 3:  HYPERTENSION (ICD-401.9) Assessment: Unchanged  Her updated medication list for this problem includes:    Azor 10-40 Mg Tabs (Amlodipine-olmesartan) .Marland Kitchen... 1/2 by mouth daily    Bisoprolol Fumarate 5 Mg Tabs (Bisoprolol fumarate) ..... One by mouth daily  BP today: 122/74 Prior BP: 134/80 (10/07/2009)  Prior 10 Yr Risk Heart Disease: N/A (08/01/2008)  Labs Reviewed: Chol: 139 (10/07/2009)   HDL: 46.50 (10/07/2009)   LDL: 63 (08/09/2006)   TG: 122 (08/09/2006)  Problem # 4:  HYPERLIPIDEMIA (ICD-272.4) Assessment: Unchanged  Her updated medication list for this problem includes:    Lipitor 80 Mg Tabs (Atorvastatin calcium) .Marland Kitchen... At bedtime  Labs Reviewed: SGOT: 26  (08/09/2006)   SGPT: 12 (08/09/2006)  Lipid Goals: Chol Goal: 200 (10/07/2009)   HDL Goal: 40 (10/07/2009)   LDL Goal: 100 (10/07/2009)   TG Goal: 150 (10/07/2009)  Prior 10 Yr Risk Heart Disease: N/A (08/01/2008)   HDL:46.50 (10/07/2009), 36.5 (08/09/2006)  LDL:63 (08/09/2006)  Chol:139 (10/07/2009), 124 (08/09/2006)  Trig:122 (08/09/2006)  Complete Medication List: 1)  Prilosec 20 Mg Cpdr (Omeprazole) .... Once daily 2)  Lipitor 80 Mg Tabs (Atorvastatin calcium) .... At bedtime 3)  Synthroid 88 Mcg Tabs (Levothyroxine sodium) .Marland Kitchen.. 1 tab before breakfast 4)  Adult Aspirin Ec Low Strength 81 Mg Tbec (Aspirin) .... Once daily 5)  Klonopin 1 Mg Tabs (Clonazepam) .... 1/2 po q 6 pm and then one whole klonopin at bedtime 6)  Azor 10-40 Mg Tabs (Amlodipine-olmesartan) .... 1/2 by mouth daily 7)  Calcium 600/vitamin D 600-200 Mg-unit Tabs (Calcium carbonate-vitamin d) .... Once daily 8)  Vitamin B-12 500 Mcg Tabs (Cyanocobalamin) .... Once daily 9)  Bl Vitamin E 400 Unit Caps (Vitamin e) .... Once daily 10)  Therapeutic Multivitamin Tabs (Multiple vitamin) .... Once daily 11)  Remeron 30 Mg Tabs (Mirtazapine) .... One by mouth q hs 12)  Bisoprolol Fumarate 5 Mg Tabs (Bisoprolol fumarate) .... One by mouth daily 13)  Seroquel 50 Mg Tabs (Quetiapine fumarate) .... One by mouth q hs  rx number [id number]  Other Orders: Flu Vaccine 58yrs + MEDICARE PATIENTS [phone]) Administration Flu vaccine - MCR (D6644) Pneumococcal Vaccine (03474) Admin 1st Vaccine (25956)  Hypertension Assessment/Plan:      The patient's hypertensive risk group is category C: Target organ damage and/or diabetes.  Today's blood pressure is 122/74.  Her blood pressure goal is < 140/90.  Lipid Assessment/Plan:      Based on NCEP/ATP III, the patient's risk factor category is "history of coronary disease, peripheral vascular disease, cerebrovascular disease, or aortic aneurysm".  The patient's lipid goals are as follows: Total  cholesterol goal is 200; LDL cholesterol goal is 100; HDL cholesterol goal is 40; Triglyceride goal is 150.  Her LDL cholesterol goal has been met.    Patient Instructions: 1)  Please schedule a follow-up appointment in 4 months. Fasting   Immunizations Administered:  Pneumonia Vaccine:    Vaccine Type: Pneumovax (Medicare)    Site: right deltoid    Mfr: Merck    Dose: 0.5 ml    Route: IM    Given by: Willy Eddy, LPN    Exp. Date: 05/11/2011    Lot #: [id number]    VIS given: 03/25/09 version given January 08, 2010.

## 2010-05-22 NOTE — Assessment & Plan Note (Signed)
Summary: possible uti/bmw   Primary Care Provider:  Darryll Capers, MD   History of Present Illness: 77  -year-old patient with a two day history of burning dysuria and urinary frequency ; denies any fever or chills.  She has a history of hypertension and dyslipidemia which have been stable.  No other concerns or complaints.  Denies any flank pain  Allergies: 1)  ! Demerol 2)  ! Tylox 3)  ! Codeine  Past History:  Past Medical History: Reviewed history from 06/13/2008 and no changes required. Insomnia Anxiety Hyperlipidemia Hypertension Hypothyroidism Low back pain L wrist fracture, 09/2007 R shoulder fracture, 02/2008 GERD Barretts Esophagus Hemorrhoids Coronary Artery Disease Hiatal hernia Hyperplastic colon polyps 06/2005  Review of Systems       The patient complains of anorexia and abdominal pain.  The patient denies fever, weight loss, weight gain, vision loss, decreased hearing, hoarseness, chest pain, syncope, dyspnea on exertion, peripheral edema, prolonged cough, headaches, hemoptysis, melena, hematochezia, severe indigestion/heartburn, hematuria, incontinence, genital sores, muscle weakness, suspicious skin lesions, transient blindness, difficulty walking, depression, unusual weight change, abnormal bleeding, enlarged lymph nodes, angioedema, and breast masses.    Physical Exam  General:  Well-developed,well-nourished,in no acute distress; alert,appropriate and cooperative throughout examination Head:  Normocephalic and atraumatic without obvious abnormalities. No apparent alopecia or balding. Ears:  External ear exam shows no significant lesions or deformities.  Otoscopic examination reveals clear canals, tympanic membranes are intact bilaterally without bulging, retraction, inflammation or discharge. Hearing is grossly normal bilaterally. Mouth:  Oral mucosa and oropharynx without lesions or exudates.  Teeth in good repair. Neck:  No deformities, masses, or  tenderness noted. Lungs:  Normal respiratory effort, chest expands symmetrically. Lungs are clear to auscultation, no crackles or wheezes. Heart:  Normal rate and regular rhythm. S1 and S2 normal without gallop, murmur, click, rub or other extra sounds. Abdomen:  Bowel sounds positive,abdomen soft and non-tender without masses, organomegaly or hernias noted.   Impression & Recommendations:  Problem # 1:  FREQUENCY, URINARY (ICD-788.41)  Problem # 2:  DYSURIA (ICD-788.1)  The following medications were removed from the medication list:    Ceftin 250 Mg Tabs (Cefuroxime axetil) .Marland Kitchen... 1 tab by mouth two times a day x 10 days Her updated medication list for this problem includes:    Ciprofloxacin Hcl 500 Mg Tabs (Ciprofloxacin hcl) ..... One twice daily  Orders: UA Dipstick w/o Micro (automated)  (81003) T-Culture, Urine ([id number]) Specimen Handling (09811)  Problem # 3:  HYPERTENSION (ICD-401.9)  Her updated medication list for this problem includes:    Azor 10-40 Mg Tabs (Amlodipine-olmesartan) .Marland Kitchen... 1/2 by mouth daily    Bisoprolol Fumarate 5 Mg Tabs (Bisoprolol fumarate) ..... One by mouth daily  Problem # 4:  UTI (ICD-599.0)  The following medications were removed from the medication list:    Ceftin 250 Mg Tabs (Cefuroxime axetil) .Marland Kitchen... 1 tab by mouth two times a day x 10 days Her updated medication list for this problem includes:    Ciprofloxacin Hcl 500 Mg Tabs (Ciprofloxacin hcl) ..... One twice daily  Complete Medication List: 1)  Prilosec 20 Mg Cpdr (Omeprazole) .... Once daily 2)  Lipitor 80 Mg Tabs (Atorvastatin calcium) .... At bedtime 3)  Synthroid 88 Mcg Tabs (Levothyroxine sodium) .Marland Kitchen.. 1 tab before breakfast 4)  Adult Aspirin Ec Low Strength 81 Mg Tbec (Aspirin) .... Once daily 5)  Klonopin 1 Mg Tabs (Clonazepam) .... 1/2 po q 6 pm and then one whole klonopin at bedtime 6)  Azor  10-40 Mg Tabs (Amlodipine-olmesartan) .... 1/2 by mouth daily 7)  Calcium  600/vitamin D 600-200 Mg-unit Tabs (Calcium carbonate-vitamin d) .... Once daily 8)  Vitamin B-12 500 Mcg Tabs (Cyanocobalamin) .... Once daily 9)  Bl Vitamin E 400 Unit Caps (Vitamin e) .... Once daily 10)  Therapeutic Multivitamin Tabs (Multiple vitamin) .... Once daily 11)  Remeron 30 Mg Tabs (Mirtazapine) .... One by mouth q hs 12)  Bisoprolol Fumarate 5 Mg Tabs (Bisoprolol fumarate) .... One by mouth daily 13)  Seroquel 50 Mg Tabs (Quetiapine fumarate) .... One by mouth q hs  rx number [id number] 14)  Qvar 80 Mcg/act Aers (Beclomethasone dipropionate) .... 2 puffs by mouth two times a day 15)  Proventil Hfa 108 (90 Base) Mcg/act Aers (Albuterol sulfate) .Marland Kitchen.. 1-2 puffs by mouth q 6 hours as needed sob/cough/wheeze 16)  Tessalon Perles 100 Mg Caps (Benzonatate) .Marland Kitchen.. 1 cap by mouth three times a day as needed cough 17)  Align 4 Mg Caps (Probiotic product) .Marland Kitchen.. 1 cap by mouth once daily for 30 days 18)  Ciprofloxacin Hcl 500 Mg Tabs (Ciprofloxacin hcl) .... One twice daily  Patient Instructions: 1)  Please schedule a follow-up appointment as needed. 2)  Drink as much fluid as you can tolerate for the next few days. 3)  Take your antibiotic as prescribed until ALL of it is gone, but stop if you develop a rash or swelling and contact our office as soon as possible. Prescriptions: CIPROFLOXACIN HCL 500 MG TABS (CIPROFLOXACIN HCL) one twice daily  #14 x 0   Entered and Authorized by:   Gordy Savers  MD   Signed by:   Gordy Savers  MD on 05/06/2010   Method used:   Print then Give to Patient   RxID:   [id number]    Orders Added: 1)  UA Dipstick w/o Micro (automated)  [81003] 2)  T-Culture, Urine [[id number]] 3)  Specimen Handling [99000] 4)  Est. Patient Level III [99213]    Laboratory Results   Urine Tests  Date/Time Recieved: May 06, 2010 3:35 PM  Date/Time Reported: May 06, 2010 3:35 PM   Routine Urinalysis   Color: yellow Appearance:  Clear Glucose: negative   (Normal Range: Negative) Bilirubin: negative   (Normal Range: Negative) Ketone: negative   (Normal Range: Negative) Spec. Gravity: 1.010   (Normal Range: 1.003-1.035) Blood: 1+   (Normal Range: Negative) pH: 6.0   (Normal Range: 5.0-8.0) Protein: negative   (Normal Range: Negative) Urobilinogen: 0.2   (Normal Range: 0-1) Nitrite: negative   (Normal Range: Negative) Leukocyte Esterace: 3+   (Normal Range: Negative)    Comments: Wynona Canes, CMA  May 06, 2010 3:35 PM

## 2010-05-22 NOTE — Assessment & Plan Note (Signed)
Summary: COUGH/CONGESTION/NJR   Vital Signs:  Patient profile:   77 year old female Height:      64 inches (162.56 cm) Weight:      146 pounds (66.36 kg) O2 Sat:      94 % on Room air Temp:     97.5 degrees F (36.39 degrees C) oral Pulse rate:   92 / minute BP sitting:   120 / 78  (left arm) Cuff size:   regular  Vitals Entered By: Josph Macho RMA (January 28, 2010 3:16 PM)  O2 Flow:  Room air  Serial Vital Signs/Assessments:                                PEF    PreRx  PostRx Time      O2 Sat  O2 Type     L/min  L/min  L/min   By           96  %   Room air                          Danise Edge MD  CC: Cough and congestion X2 weeks/ pt took zpak on 01/16/10 so should have been done on the 3rd of October/ CF Is Patient Diabetic? No   History of Present Illness: Patient has been struggling with coughing congestion for several weeks now. And for a routine evaluation in early September received her flu and pneumonia shots and within a few days was struggling with nasal congestion cough malaise fevers and started a Z-Pak on 929 2011 to a 5 day course and the fevers and chills resolved. The rhinorrhea lessened but did not completely resolve it is usually clear and occasionally green. On her cough is persisting keeping her up at night and causing her difficulty throughout the day it is wet and productive. She is wheezing on occasion presently no fevers,struggling with excessive fatigue and weakness no chills chills no edema, heartburn, nausea, vomiting, diarrhea, palpitations, headache. She is  Current Medications (verified): 1)  Prilosec 20 Mg  Cpdr (Omeprazole) .... Once Daily 2)  Lipitor 80 Mg  Tabs (Atorvastatin Calcium) .... At Bedtime 3)  Synthroid 88 Mcg  Tabs (Levothyroxine Sodium) .Marland Kitchen.. 1 Tab Before Breakfast 4)  Adult Aspirin Ec Low Strength 81 Mg  Tbec (Aspirin) .... Once Daily 5)  Klonopin 1 Mg  Tabs (Clonazepam) .... 1/2 Po Q 6 Pm and Then One Whole Klonopin At Bedtime 6)   Azor 10-40 Mg  Tabs (Amlodipine-Olmesartan) .... 1/2 By Mouth Daily 7)  Calcium 600/vitamin D 600-200 Mg-Unit  Tabs (Calcium Carbonate-Vitamin D) .... Once Daily 8)  Vitamin B-12 500 Mcg  Tabs (Cyanocobalamin) .... Once Daily 9)  Bl Vitamin E 400 Unit  Caps (Vitamin E) .... Once Daily 10)  Therapeutic Multivitamin   Tabs (Multiple Vitamin) .... Once Daily 11)  Remeron 30 Mg Tabs (Mirtazapine) .... One By Mouth Q Hs 12)  Bisoprolol Fumarate 5 Mg Tabs (Bisoprolol Fumarate) .... One By Mouth Daily 13)  Seroquel 50 Mg Tabs (Quetiapine Fumarate) .... One By Mouth Q Hs  Rx Number [id number]  Allergies (verified): 1)  ! Demerol 2)  ! Tylox 3)  ! Codeine  Past History:  Past medical history reviewed for relevance to current acute and chronic problems. Social history (including risk factors) reviewed for relevance to current acute and chronic problems.  Past Medical History: Reviewed  history from 06/13/2008 and no changes required. Insomnia Anxiety Hyperlipidemia Hypertension Hypothyroidism Low back pain L wrist fracture, 09/2007 R shoulder fracture, 02/2008 GERD Barretts Esophagus Hemorrhoids Coronary Artery Disease Hiatal hernia Hyperplastic colon polyps 06/2005  Social History: Reviewed history from 06/13/2008 and no changes required. Married Never Smoked widowed Alcohol Use - no Daily Caffeine Use 1 can Pepsi in AM Illicit Drug Use - no  Review of Systems      See HPI  Physical Exam  General:  Well-developed,well-nourished,in no acute distress; alert,appropriate and cooperative throughout examination Head:  Normocephalic and atraumatic without obvious abnormalities. No apparent alopecia or balding. Ears:  External ear exam shows no significant lesions or deformities.  Otoscopic examination reveals clear canals, tympanic membranes are intact bilaterally without bulging, retraction, inflammation or discharge. Hearing is grossly normal bilaterally.  Nose:  mucosal erythema  and mucosal edema.   Mouth:  Oral mucosa and oropharynx without lesions or exudates.  Teeth in good repair. Neck:  No deformities, masses, or tenderness noted. Lungs:  Normal respiratory effort, chest expands symmetrically. Lungs are clear to auscultation, no  wheezes. Slightly decreased BS b/l bases Heart:  Normal rate and regular rhythm. S1 and S2 normal without gallop, murmur, click, rub or other extra sounds. Abdomen:  Bowel sounds positive,abdomen soft and non-tender without masses, organomegaly or hernias noted. Msk:  No deformity or scoliosis noted of thoracic or lumbar spine.   Extremities:  No clubbing, cyanosis, edema, or deformity noted with normal full range of motion of all joints.   Neurologic:  No cranial nerve deficits noted. Station and gait are normal. Plantar reflexes are down-going bilaterally. DTRs are symmetrical throughout. Sensory, motor and coordinative functions appear intact. Cervical Nodes:  No lymphadenopathy noted Psych:  Cognition and judgment appear intact. Alert and cooperative with normal attention span and concentration. No apparent delusions, illusions, hallucinations   Impression & Recommendations:  Problem # 1:  ACUTE BRONCHITIS (ICD-466.0)  The following medications were removed from the medication list:    Zithromax Z-pak 250 Mg Tabs (Azithromycin) .Marland Kitchen... Take as directed    Atuss Ds 30-4-30 Mg/36ml Susp (Pseudoephed hcl-cpm-dm hbr tan) .Marland Kitchen... 2 tsp every 12 horus Her updated medication list for this problem includes:    Qvar 80 Mcg/act Aers (Beclomethasone dipropionate) .Marland Kitchen... 2 puffs by mouth two times a day    Proventil Hfa 108 (90 Base) Mcg/act Aers (Albuterol sulfate) .Marland Kitchen... 1-2 puffs by mouth q 6 hours as needed sob/cough/wheeze    Tessalon Perles 100 Mg Caps (Benzonatate) .Marland Kitchen... 1 cap by mouth three times a day as needed cough    Ceftin 250 Mg Tabs (Cefuroxime axetil) .Marland Kitchen... 1 tab by mouth two times a day x 10 days Report persistent symptoms She does  have a distant h/o 2ppd habit and is exposed to some 2nd hand smoke  Problem # 2:  HYPERTENSION (ICD-401.9)  Her updated medication list for this problem includes:    Azor 10-40 Mg Tabs (Amlodipine-olmesartan) .Marland Kitchen... 1/2 by mouth daily    Bisoprolol Fumarate 5 Mg Tabs (Bisoprolol fumarate) ..... One by mouth daily Well controlled no change in therapy today  Problem # 3:  HYPERLIPIDEMIA (ICD-272.4)  Her updated medication list for this problem includes:    Lipitor 80 Mg Tabs (Atorvastatin calcium) .Marland Kitchen... At bedtime Tolerating meds no change in therapy  Complete Medication List: 1)  Prilosec 20 Mg Cpdr (Omeprazole) .... Once daily 2)  Lipitor 80 Mg Tabs (Atorvastatin calcium) .... At bedtime 3)  Synthroid 88 Mcg Tabs (Levothyroxine  sodium) .Marland Kitchen.. 1 tab before breakfast 4)  Adult Aspirin Ec Low Strength 81 Mg Tbec (Aspirin) .... Once daily 5)  Klonopin 1 Mg Tabs (Clonazepam) .... 1/2 po q 6 pm and then one whole klonopin at bedtime 6)  Azor 10-40 Mg Tabs (Amlodipine-olmesartan) .... 1/2 by mouth daily 7)  Calcium 600/vitamin D 600-200 Mg-unit Tabs (Calcium carbonate-vitamin d) .... Once daily 8)  Vitamin B-12 500 Mcg Tabs (Cyanocobalamin) .... Once daily 9)  Bl Vitamin E 400 Unit Caps (Vitamin e) .... Once daily 10)  Therapeutic Multivitamin Tabs (Multiple vitamin) .... Once daily 11)  Remeron 30 Mg Tabs (Mirtazapine) .... One by mouth q hs 12)  Bisoprolol Fumarate 5 Mg Tabs (Bisoprolol fumarate) .... One by mouth daily 13)  Seroquel 50 Mg Tabs (Quetiapine fumarate) .... One by mouth q hs  rx number [id number] 14)  Qvar 80 Mcg/act Aers (Beclomethasone dipropionate) .... 2 puffs by mouth two times a day 15)  Proventil Hfa 108 (90 Base) Mcg/act Aers (Albuterol sulfate) .Marland Kitchen.. 1-2 puffs by mouth q 6 hours as needed sob/cough/wheeze 16)  Tessalon Perles 100 Mg Caps (Benzonatate) .Marland Kitchen.. 1 cap by mouth three times a day as needed cough 17)  Ceftin 250 Mg Tabs (Cefuroxime axetil) .Marland Kitchen.. 1 tab by mouth two  times a day x 10 days 18)  Align 4 Mg Caps (Probiotic product) .Marland Kitchen.. 1 cap by mouth once daily for 30 days  Other Orders: T-2 View CXR (71020TC) Prescriptions: CEFTIN 250 MG TABS (CEFUROXIME AXETIL) 1 tab by mouth two times a day x 10 days  #20 x 0   Entered and Authorized by:   Danise Edge MD   Signed by:   Danise Edge MD on 01/28/2010   Method used:   Electronically to        [name] [id number] (retail)       [address]       [place], [place]  [address]       Ph: [phone]       Fax: [phone]   RxID:   [id number] TESSALON PERLES 100 MG CAPS (BENZONATATE) 1 cap by mouth three times a day as needed cough  #30 x 0   Entered and Authorized by:   Danise Edge MD   Signed by:   Danise Edge MD on 01/28/2010   Method used:   Electronically to        [name] [address] (retail)       [address] [place], [place]  [address]       Ph: [phone]       Fax: [phone]   RxID:   [id number] PROVENTIL HFA 108 (90 BASE) MCG/ACT AERS (ALBUTEROL SULFATE) 1-2 puffs by mouth q 6 hours as needed sob/cough/wheeze  #1 hfa x 2   Entered and Authorized by:   Danise Edge MD   Signed by:   Danise Edge MD on 01/28/2010   Method used:   Electronically to        [name] [address] (retail)       [address] [place], [place]  [address]       Ph: [phone]       Fax: [phone]   RxID:   [id number] QVAR  80 MCG/ACT AERS (BECLOMETHASONE DIPROPIONATE) 2 puffs by mouth two times a day  #1 hfa x 0   Entered and Authorized by:   Danise Edge MD   Signed by:   Danise Edge MD on 01/28/2010   Method used:   Samples Given   RxID:   [id number]

## 2010-05-22 NOTE — Progress Notes (Signed)
Summary: refill  Phone Note Refill Request Call back at Home Phone [phone] Message from:  Patient--live call  Refills Requested: Medication #1:  KLONOPIN 1 MG  TABS 1/2 po q 6 pm and then one whole klonopin at bedtime   Brand Name Necessary? No Call in #[facility] to [name] [address] until mail arrive.  Initial call taken by: Warnell Forester,  Aug 23, 2009 2:41 PM    Prescriptions: KLONOPIN 1 MG  TABS (CLONAZEPAM) 1/2 po q 6 pm and then one whole klonopin at bedtime  #10 x 0   Entered by:   Willy Eddy, LPN   Authorized by:   Stacie Glaze MD   Signed by:   Willy Eddy, LPN on 14/78/2956   Method used:   Telephoned to ...       [name] [id number] (retail)       [address] [place], [place]  [address]       Ph: [phone]       Fax: [phone]   RxID:   [id number]

## 2010-05-22 NOTE — Progress Notes (Signed)
Summary: cough  Phone Note Call from Patient Call back at Home Phone [phone]   Caller: Patient Call For: Stacie Glaze MD Summary of Call: Pt has cough started last night no fever and sore throat. Please call  cough med and abx  into [facility] [phone] Initial call taken by: Heron Sabins,  January 16, 2010 12:26 PM  Follow-up for Phone Call        per dr Lovell Sheehan- m ay have z pack and atuss- pt informed and sent in Follow-up by: Willy Eddy, LPN,  January 16, 2010 1:30 PM    New/Updated Medications: ZITHROMAX Z-PAK 250 MG TABS (AZITHROMYCIN) take as directed ATUSS DS 30-4-30 MG/5ML SUSP (PSEUDOEPHED HCL-CPM-DM HBR TAN) 2 tsp every 12 horus Prescriptions: ATUSS DS 30-4-30 MG/5ML SUSP (PSEUDOEPHED HCL-CPM-DM HBR TAN) 2 tsp every 12 horus  #6oz x 0   Entered by:   Willy Eddy, LPN   Authorized by:   Stacie Glaze MD   Signed by:   Willy Eddy, LPN on 47/82/9562   Method used:   Electronically to        [name] [address] (retail)       [address]       [place], [place]  [address]       Ph: [phone]       Fax: [phone]   RxID:   [id number] ZITHROMAX Z-PAK 250 MG TABS (AZITHROMYCIN) take as directed  #1 x 0   Entered by:   Willy Eddy, LPN   Authorized by:   Stacie Glaze MD   Signed by:   Willy Eddy, LPN on 59/56/3875   Method used:   Electronically to        [name] [address] (retail)       [address]       [place], [place]  [address]       Ph: [phone]       Fax: [phone]   RxID:   [id number]

## 2010-05-22 NOTE — Letter (Signed)
Summary: [facility]   Imported By: Maryln Gottron 06/11/2009 15:18:27  _____________________________________________________________________  External Attachment:    Type:   Image     Comment:   External Document

## 2010-05-22 NOTE — Assessment & Plan Note (Signed)
Summary: 3 month rov/njr   Vital Signs:  Patient profile:   77 year old female Height:      64 inches Weight:      150 pounds Temp:     98.2 degrees F oral Pulse rate:   76 / minute BP sitting:   130 / 64  (left arm)  Vitals Entered By: Willy Eddy, LPN (July 08, 2009 2:46 PM) CC: roa, Hypertension Management   Primary Care Provider:  Darryll Capers, MD  CC:  roa and Hypertension Management.  History of Present Illness: the pt has blood work at [facility] and we will need to call to see hwha was drawn beore we moniter blood Had shingles in 1995 and has concerns about a shingle vaccine we reviewed the reports from [facility] cardiology and will request the labs that were drawn last month she reports that her lipids were "good" but does not have a copy she has not complaints from her reflus as long as she takes the ppi  Hypertension History:      She denies headache, chest pain, palpitations, dyspnea with exertion, orthopnea, PND, peripheral edema, visual symptoms, neurologic problems, syncope, and side effects from treatment.        Positive major cardiovascular risk factors include female age 71 years old or older, hyperlipidemia, and hypertension.  Negative major cardiovascular risk factors include non-tobacco-user status.        Positive history for target organ damage include ASHD (either angina/prior MI/prior CABG).     Preventive Screening-Counseling & Management  Alcohol-Tobacco     Smoking Status: never  Problems Prior to Update: 1)  Shingles, Recurrent  (ICD-053.9) 2)  Tachycardia  (ICD-785.0) 3)  Barrett's Esophagus  (ICD-530.85) 4)  Dysuria  (ICD-788.1) 5)  Closed Fracture Unspec Part Upper End Humerus  (ICD-812.00) 6)  Low Back Pain  (ICD-724.2) 7)  Bursitis, Acromioclavicular, Right  (ICD-726.10) 8)  Insomnia, Chronic  (ICD-307.42) 9)  Neck Pain, Right  (ICD-723.1) 10)  Hypothyroidism  (ICD-244.9) 11)  Hypertension  (ICD-401.9) 12)  Hyperlipidemia   (ICD-272.4) 13)  Anxiety  (ICD-300.00)  Current Problems (verified): 1)  Tachycardia  (ICD-785.0) 2)  Barrett's Esophagus  (ICD-530.85) 3)  Dysuria  (ICD-788.1) 4)  Closed Fracture Unspec Part Upper End Humerus  (ICD-812.00) 5)  Low Back Pain  (ICD-724.2) 6)  Bursitis, Acromioclavicular, Right  (ICD-726.10) 7)  Insomnia, Chronic  (ICD-307.42) 8)  Neck Pain, Right  (ICD-723.1) 9)  Hypothyroidism  (ICD-244.9) 10)  Hypertension  (ICD-401.9) 11)  Hyperlipidemia  (ICD-272.4) 12)  Anxiety  (ICD-300.00)  Medications Prior to Update: 1)  Prilosec 20 Mg  Cpdr (Omeprazole) .... Once Daily 2)  Lipitor 80 Mg  Tabs (Atorvastatin Calcium) .... At Bedtime 3)  Synthroid 88 Mcg  Tabs (Levothyroxine Sodium) .Marland Kitchen.. 1 Tab Before Breakfast 4)  Adult Aspirin Ec Low Strength 81 Mg  Tbec (Aspirin) .... Once Daily 5)  Klonopin 1 Mg  Tabs (Clonazepam) .... 1/2 Po Q 6 Pm and Then One Whole Klonopin At Bedtime 6)  Azor 10-40 Mg  Tabs (Amlodipine-Olmesartan) .... 1/2 By Mouth Daily 7)  Calcium 600/vitamin D 600-200 Mg-Unit  Tabs (Calcium Carbonate-Vitamin D) .... Once Daily 8)  Vitamin B-12 500 Mcg  Tabs (Cyanocobalamin) .... Once Daily 9)  Bl Vitamin E 400 Unit  Caps (Vitamin E) .... Once Daily 10)  Therapeutic Multivitamin   Tabs (Multiple Vitamin) .... Once Daily 11)  Remeron 30 Mg Tabs (Mirtazapine) .... One By Mouth Q Hs 12)  Bisoprolol Fumarate 5 Mg  Tabs (Bisoprolol Fumarate) .... One By Mouth Daily 13)  Seroquel 50 Mg Tabs (Quetiapine Fumarate) .... One By Mouth Q Hs  Current Medications (verified): 1)  Prilosec 20 Mg  Cpdr (Omeprazole) .... Once Daily 2)  Lipitor 80 Mg  Tabs (Atorvastatin Calcium) .... At Bedtime 3)  Synthroid 88 Mcg  Tabs (Levothyroxine Sodium) .Marland Kitchen.. 1 Tab Before Breakfast 4)  Adult Aspirin Ec Low Strength 81 Mg  Tbec (Aspirin) .... Once Daily 5)  Klonopin 1 Mg  Tabs (Clonazepam) .... 1/2 Po Q 6 Pm and Then One Whole Klonopin At Bedtime 6)  Azor 10-40 Mg  Tabs (Amlodipine-Olmesartan)  .... 1/2 By Mouth Daily 7)  Calcium 600/vitamin D 600-200 Mg-Unit  Tabs (Calcium Carbonate-Vitamin D) .... Once Daily 8)  Vitamin B-12 500 Mcg  Tabs (Cyanocobalamin) .... Once Daily 9)  Bl Vitamin E 400 Unit  Caps (Vitamin E) .... Once Daily 10)  Therapeutic Multivitamin   Tabs (Multiple Vitamin) .... Once Daily 11)  Remeron 30 Mg Tabs (Mirtazapine) .... One By Mouth Q Hs 12)  Bisoprolol Fumarate 5 Mg Tabs (Bisoprolol Fumarate) .... One By Mouth Daily 13)  Seroquel 50 Mg Tabs (Quetiapine Fumarate) .... One By Mouth Q Hs  Allergies (verified): 1)  ! Demerol 2)  ! Tylox 3)  ! Codeine  Past History:  Family History: Last updated: 06/13/2008 Family History High cholesterol Family History Hypertension No FH of Colon Cancer: Family History of Diabetes: Son  Social History: Last updated: 06/13/2008 Married Never Smoked widowed Alcohol Use - no Daily Caffeine Use 1 can Pepsi in AM Illicit Drug Use - no  Risk Factors: Smoking Status: never (07/08/2009)  Past medical, surgical, family and social histories (including risk factors) reviewed, and no changes noted (except as noted below).  Past Medical History: Reviewed history from 06/13/2008 and no changes required. Insomnia Anxiety Hyperlipidemia Hypertension Hypothyroidism Low back pain L wrist fracture, 09/2007 R shoulder fracture, 02/2008 GERD Barretts Esophagus Hemorrhoids Coronary Artery Disease Hiatal hernia Hyperplastic colon polyps 06/2005  Past Surgical History: Reviewed history from 01/17/2007 and no changes required. Coronary artery bypass graft  1995 Carotid endarterectomy Cataract extraction  Family History: Reviewed history from 06/13/2008 and no changes required. Family History High cholesterol Family History Hypertension No FH of Colon Cancer: Family History of Diabetes: Son  Social History: Reviewed history from 06/13/2008 and no changes required. Married Never Smoked widowed Alcohol Use -  no Daily Caffeine Use 1 can Pepsi in AM Illicit Drug Use - no  Review of Systems  The patient denies anorexia, fever, weight loss, weight gain, vision loss, decreased hearing, hoarseness, chest pain, syncope, dyspnea on exertion, peripheral edema, prolonged cough, headaches, hemoptysis, abdominal pain, melena, hematochezia, severe indigestion/heartburn, hematuria, incontinence, genital sores, muscle weakness, suspicious skin lesions, transient blindness, difficulty walking, depression, unusual weight change, abnormal bleeding, enlarged lymph nodes, angioedema, and breast masses.    Physical Exam  General:  patient is alert and cooperative and in no distress Head:  Normocephalic and atraumatic. Eyes:  pupils equal and pupils round.   Ears:  R ear normal and L ear normal.   Neck:  supple Lungs:  clear to auscultationno wheezes.   Heart:  normal rate and regular rhythm.   Abdomen:  soft and non-tender.   Extremities:  No clubbing, cyanosis, edema, or deformity noted with normal full range of motion of all joints.   Neurologic:  alert & oriented X3 and gait normal.   Psych:  normally interactive and slightly anxious.  Impression & Recommendations:  Problem # 1:  HYPERTENSION (ICD-401.9)  Her updated medication list for this problem includes:    Azor 10-40 Mg Tabs (Amlodipine-olmesartan) .Marland Kitchen... 1/2 by mouth daily    Bisoprolol Fumarate 5 Mg Tabs (Bisoprolol fumarate) ..... One by mouth daily  BP today: 130/64 Prior BP: 130/80 (04/08/2009)  Prior 10 Yr Risk Heart Disease: N/A (08/01/2008)  Labs Reviewed: Chol: 124 (08/09/2006)   HDL: 36.5 (08/09/2006)   LDL: 63 (08/09/2006)   TG: 122 (08/09/2006)  Problem # 2:  HYPERLIPIDEMIA (ICD-272.4) Assessment: Unchanged  Her updated medication list for this problem includes:    Lipitor 80 Mg Tabs (Atorvastatin calcium) .Marland Kitchen... At bedtime  Labs Reviewed: SGOT: 26 (08/09/2006)   SGPT: 12 (08/09/2006)  Prior 10 Yr Risk Heart Disease: N/A  (08/01/2008)   HDL:36.5 (08/09/2006)  LDL:63 (08/09/2006)  Chol:124 (08/09/2006)  Trig:122 (08/09/2006)  Problem # 3:  HYPOTHYROIDISM (ICD-244.9)  Her updated medication list for this problem includes:    Synthroid 88 Mcg Tabs (Levothyroxine sodium) .Marland Kitchen... 1 tab before breakfast  Labs Reviewed: TSH: 5.62 (05/30/2008)   Free T4: 0.9 (05/30/2008)    Chol: 124 (08/09/2006)   HDL: 36.5 (08/09/2006)   LDL: 63 (08/09/2006)   TG: 122 (08/09/2006)  Problem # 4:  SHINGLES, RECURRENT (ICD-053.9)  Orders: Venipuncture ([address]) T- * Misc. Laboratory test [id number])  Problem # 5:  BARRETT'S ESOPHAGUS (ICD-530.85) ON ppi  Complete Medication List: 1)  Prilosec 20 Mg Cpdr (Omeprazole) .... Once daily 2)  Lipitor 80 Mg Tabs (Atorvastatin calcium) .... At bedtime 3)  Synthroid 88 Mcg Tabs (Levothyroxine sodium) .Marland Kitchen.. 1 tab before breakfast 4)  Adult Aspirin Ec Low Strength 81 Mg Tbec (Aspirin) .... Once daily 5)  Klonopin 1 Mg Tabs (Clonazepam) .... 1/2 po q 6 pm and then one whole klonopin at bedtime 6)  Azor 10-40 Mg Tabs (Amlodipine-olmesartan) .... 1/2 by mouth daily 7)  Calcium 600/vitamin D 600-200 Mg-unit Tabs (Calcium carbonate-vitamin d) .... Once daily 8)  Vitamin B-12 500 Mcg Tabs (Cyanocobalamin) .... Once daily 9)  Bl Vitamin E 400 Unit Caps (Vitamin e) .... Once daily 10)  Therapeutic Multivitamin Tabs (Multiple vitamin) .... Once daily 11)  Remeron 30 Mg Tabs (Mirtazapine) .... One by mouth q hs 12)  Bisoprolol Fumarate 5 Mg Tabs (Bisoprolol fumarate) .... One by mouth daily 13)  Seroquel 50 Mg Tabs (Quetiapine fumarate) .... One by mouth q hs  Hypertension Assessment/Plan:      The patient's hypertensive risk group is category C: Target organ damage and/or diabetes.  Today's blood pressure is 130/64.  Her blood pressure goal is < 140/90.  Patient Instructions: 1)  Please schedule a follow-up appointment in 3 months. Prescriptions: SYNTHROID 88 MCG  TABS (LEVOTHYROXINE SODIUM) 1 tab  before breakfast  #90 x 3   Entered and Authorized by:   Stacie Glaze MD   Signed by:   Stacie Glaze MD on 07/08/2009   Method used:   Faxed to ...       [facility])       [address] [address]       [place], [place]  [address]       Ph: [phone]       Fax: [phone]   RxID:   [id number] KLONOPIN 1 MG  TABS (CLONAZEPAM) 1/2 po q 6 pm and then one whole klonopin at bedtime  #145 x 3   Entered and Authorized by:   Stacie Glaze MD   Signed by:  Stacie Glaze MD on 07/08/2009   Method used:   Print then Give to Patient   RxID:   [id number] PRILOSEC 20 MG  CPDR (OMEPRAZOLE) once daily  #90 x 3   Entered and Authorized by:   Stacie Glaze MD   Signed by:   Stacie Glaze MD on 07/08/2009   Method used:   Faxed to ...       [facility])       [address] [address]       [place], [place]  [address]       Ph: [phone]       Fax: [phone]   RxID:   [id number] BISOPROLOL FUMARATE 5 MG TABS (BISOPROLOL FUMARATE) one by mouth daily  #90 x 3   Entered and Authorized by:   Stacie Glaze MD   Signed by:   Stacie Glaze MD on 07/08/2009   Method used:   Faxed to ...       [facility])       [address] [address]       [place], [place]  [address]       Ph: [phone]       Fax: [phone]   RxID:   [id number] LIPITOR 80 MG  TABS (ATORVASTATIN CALCIUM) at bedtime  #90 x 3   Entered and Authorized by:   Stacie Glaze MD   Signed by:   Stacie Glaze MD on 07/08/2009   Method used:   Faxed to ...       [facility] education [facility])       [address] [address]       [place], [place]  [address]       Ph: [phone]       Fax: [phone]   RxID:   [id number]

## 2010-05-22 NOTE — Progress Notes (Signed)
Summary: Pt req refill of Clonazepam 1mg  to [facility]  Phone Note Refill Request   Refills Requested: Medication #1:  KLONOPIN 1 MG  TABS 1/2 po q 6 pm and then one whole klonopin at bedtime   Dosage confirmed as above?Dosage Confirmed   Supply Requested: 3 months Pls call in to [facility] mail order pharmacy.    Method Requested: Telephone to [facility] Initial call taken by: Lucy Antigua,  December 11, 2009 12:02 PM    Prescriptions: KLONOPIN 1 MG  TABS (CLONAZEPAM) 1/2 po q 6 pm and then one whole klonopin at bedtime  #45 x 3   Entered by:   Willy Eddy, LPN   Authorized by:   Stacie Glaze MD   Signed by:   Willy Eddy, LPN on 36/64/4034   Method used:   Samples Given   RxID:   [id number]   Appended Document: Pt req refill of Clonazepam 1mg  to [facility] called pharmacy andsent in meds

## 2010-05-22 NOTE — Progress Notes (Signed)
Summary: rtc  Phone Note Call from Patient Call back at Home Phone [phone]   Caller: Daughter Call For: Stacie Glaze MD Summary of Call: Daughter is returning christy call she will get pt to phone. Initial call taken by: Heron Sabins,  January 29, 2010 9:29 AM  Follow-up for Phone Call        Spoke with pt. Follow-up by: Josph Macho RMA,  January 29, 2010 9:45 AM

## 2010-05-22 NOTE — Progress Notes (Signed)
  Phone Note Call from Patient   Caller: Daughter Call For: Stacie Glaze MD Summary of Call: Pt is complaining of UTI, dysuria and urgency. No fever. Needs RX at [facility] [id number] [phone] Initial call taken by: Lynann Beaver CMA AAMA,  May 06, 2010 1:23 PM  Follow-up for Phone Call        appointment with dr Amador Cunas Follow-up by: Willy Eddy, LPN,  May 06, 2010 1:42 PM

## 2010-05-22 NOTE — Progress Notes (Signed)
Summary: early refill request  Phone Note Refill Request Call back at [phone] Message from:  Pharmacy---Jill @ [facility]  Refills Requested: Medication #1:  KLONOPIN 1 MG  TABS 1/2 po q 6 pm and then one whole klonopin at bedtime   Brand Name Necessary? No rx lost in mail. Ok to fill early? please send new rx to [facility]. was shipped out on 08-21-2009  Initial call taken by: Warnell Forester,  October 11, 2009 12:43 PM    Prescriptions: KLONOPIN 1 MG  TABS (CLONAZEPAM) 1/2 po q 6 pm and then one whole klonopin at bedtime  #145 x 3   Entered by:   Willy Eddy, LPN   Authorized by:   Stacie Glaze MD   Signed by:   Willy Eddy, LPN on 57/84/6962   Method used:   Telephoned to ...       [facility])       [address] [address]       [place], [place]  [address]       Ph: [phone]       Fax: [phone]   RxID:   [id number]

## 2010-05-22 NOTE — Letter (Signed)
Summary: [facility]  [facility]   Imported By: Maryln Gottron 12/13/2009 09:41:13  _____________________________________________________________________  External Attachment:    Type:   Image     Comment:   External Document

## 2010-05-22 NOTE — Progress Notes (Signed)
Summary: cough  Phone Note Call from Patient   Caller: Patient Call For: Stacie Glaze MD Summary of Call: Pt's daughter is calling to let us know her Mom is complaining of sore throat with productive cough (clear).  Coughs all night. Wheezing today.  No fever.  Cough x 3 days. [name]) [phone] Initial call taken by: Lynann Beaver CMA,  November 14, 2009 10:03 AM  Follow-up for Phone Call        per d rjenkins- may have z pack and atuss12hrs 2 every 12 hours     New/Updated Medications: ZITHROMAX Z-PAK 250 MG TABS (AZITHROMYCIN) take as directed ATUSS DS 30-4-30 MG/5ML SUSP (PSEUDOEPHED HCL-CPM-DM HBR TAN) 2 tsp every 12 hours Prescriptions: ATUSS DS 30-4-30 MG/5ML SUSP (PSEUDOEPHED HCL-CPM-DM HBR TAN) 2 tsp every 12 hours  #6oz x 0   Entered by:   Willy Eddy, LPN   Authorized by:   Stacie Glaze MD   Signed by:   Willy Eddy, LPN on 45/40/9811   Method used:   Electronically to        [name] [id number] (retail)       [address]       [place], [place]  [address]       Ph: [phone]       Fax: [phone]   RxID:   [id number] ZITHROMAX Z-PAK 250 MG TABS (AZITHROMYCIN) take as directed  #1 x 0   Entered by:   Willy Eddy, LPN   Authorized by:   Stacie Glaze MD   Signed by:   Willy Eddy, LPN on 66/44/0347   Method used:   Electronically to        [name] [address] (retail)       [address]       [place], [place]  [address]       Ph: [phone]       Fax: [phone]   RxID:   [id number]

## 2010-05-22 NOTE — Progress Notes (Signed)
Summary: wheezing  Phone Note Call from Patient   Caller: Patient Call For: Stacie Glaze MD Summary of Call: Pt calls complaining of wheezing and URI symptoms.  Cannot get here today before we close.  Randa S[facility] tonight. Initial call taken by: Lynann Beaver CMA,  August 08, 2009 3:45 PM

## 2010-05-24 ENCOUNTER — Emergency Department (HOSPITAL_COMMUNITY): Payer: Medicare Other

## 2010-05-24 ENCOUNTER — Inpatient Hospital Stay (HOSPITAL_COMMUNITY)
Admission: EM | Admit: 2010-05-24 | Discharge: 2010-05-26 | DRG: 195 | Disposition: A | Discharge status: Home / Self Care | Payer: Medicare Other | Attending: Internal Medicine | Admitting: Internal Medicine

## 2010-05-24 DIAGNOSIS — J189 Pneumonia, unspecified organism: Principal | ICD-10-CM | POA: Diagnosis present

## 2010-05-24 DIAGNOSIS — R319 Hematuria, unspecified: Secondary | ICD-10-CM | POA: Diagnosis present

## 2010-05-24 DIAGNOSIS — E039 Hypothyroidism, unspecified: Secondary | ICD-10-CM | POA: Diagnosis present

## 2010-05-24 DIAGNOSIS — I2510 Atherosclerotic heart disease of native coronary artery without angina pectoris: Secondary | ICD-10-CM | POA: Diagnosis present

## 2010-05-24 DIAGNOSIS — I10 Essential (primary) hypertension: Secondary | ICD-10-CM | POA: Diagnosis present

## 2010-05-24 DIAGNOSIS — Z7982 Long term (current) use of aspirin: Secondary | ICD-10-CM

## 2010-05-24 DIAGNOSIS — F411 Generalized anxiety disorder: Secondary | ICD-10-CM | POA: Diagnosis present

## 2010-05-24 DIAGNOSIS — Z66 Do not resuscitate: Secondary | ICD-10-CM | POA: Diagnosis present

## 2010-05-24 DIAGNOSIS — Z87891 Personal history of nicotine dependence: Secondary | ICD-10-CM

## 2010-05-24 LAB — DIFFERENTIAL
Basophils Absolute: 0 K/uL (ref 0.0–0.1)
Basophils Relative: 0 % (ref 0–1)
Eosinophils Absolute: 0 K/uL (ref 0.0–0.7)
Eosinophils Relative: 0 % (ref 0–5)
Lymphocytes Relative: 7 % — ABNORMAL LOW (ref 12–46)
Lymphs Abs: 0.6 10*3/uL — ABNORMAL LOW (ref 0.7–4.0)
Monocytes Absolute: 1.1 10*3/uL — ABNORMAL HIGH (ref 0.1–1.0)
Monocytes Relative: 13 % — ABNORMAL HIGH (ref 3–12)
Neutro Abs: 7 K/uL (ref 1.7–7.7)
Neutrophils Relative %: 80 % — ABNORMAL HIGH (ref 43–77)

## 2010-05-24 LAB — URINE MICROSCOPIC-ADD ON

## 2010-05-24 LAB — URINALYSIS, ROUTINE W REFLEX MICROSCOPIC
Bilirubin Urine: NEGATIVE
Ketones, ur: NEGATIVE mg/dL
Nitrite: NEGATIVE
Protein, ur: NEGATIVE mg/dL
Specific Gravity, Urine: 1.016 (ref 1.005–1.030)
Urine Glucose, Fasting: NEGATIVE mg/dL
Urobilinogen, UA: 1 mg/dL (ref 0.0–1.0)
pH: 6.5 (ref 5.0–8.0)

## 2010-05-24 LAB — BASIC METABOLIC PANEL
BUN: 12 mg/dL (ref 6–23)
CO2: 26 mEq/L (ref 19–32)
Calcium: 8.3 mg/dL — ABNORMAL LOW (ref 8.4–10.5)
Creatinine, Ser: 1.07 mg/dL (ref 0.4–1.2)
Glucose, Bld: 131 mg/dL — ABNORMAL HIGH (ref 70–99)

## 2010-05-24 LAB — BASIC METABOLIC PANEL WITH GFR
Chloride: 99 meq/L (ref 96–112)
GFR calc Af Amer: 59 mL/min — ABNORMAL LOW (ref >60)
GFR calc non Af Amer: 49 mL/min — ABNORMAL LOW (ref >60)
Potassium: 4.1 meq/L (ref 3.5–5.1)
Sodium: 133 meq/L — ABNORMAL LOW (ref 135–145)

## 2010-05-24 LAB — CBC
HCT: 40.8 % (ref 36.0–46.0)
Hemoglobin: 13.5 g/dL (ref 12.0–15.0)
MCH: 30.4 pg (ref 26.0–34.0)
MCHC: 33.1 g/dL (ref 30.0–36.0)
MCV: 91.9 fL (ref 78.0–100.0)
Platelets: 135 K/uL — ABNORMAL LOW (ref 150–400)
RBC: 4.44 MIL/uL (ref 3.87–5.11)
RDW: 13 % (ref 11.5–15.5)
WBC: 8.7 K/uL (ref 4.0–10.5)

## 2010-05-25 LAB — CARDIAC PANEL(CRET KIN+CKTOT+MB+TROPI)
CK, MB: 2.6 ng/mL (ref 0.3–4.0)
CK, MB: 3.7 ng/mL (ref 0.3–4.0)
Relative Index: 2 (ref 0.0–2.5)
Total CK: 128 U/L (ref 7–177)
Troponin I: 0.01 ng/mL (ref 0.00–0.06)
Troponin I: 0.01 ng/mL (ref 0.00–0.06)

## 2010-05-25 LAB — DIFFERENTIAL
Eosinophils Absolute: 0 10*3/uL (ref 0.0–0.7)
Lymphocytes Relative: 11 % — ABNORMAL LOW (ref 12–46)
Lymphs Abs: 1.1 10*3/uL (ref 0.7–4.0)
Neutrophils Relative %: 78 % — ABNORMAL HIGH (ref 43–77)

## 2010-05-25 LAB — COMPREHENSIVE METABOLIC PANEL
Albumin: 2.9 g/dL — ABNORMAL LOW (ref 3.5–5.2)
BUN: 7 mg/dL (ref 6–23)
Creatinine, Ser: 0.93 mg/dL (ref 0.4–1.2)
Potassium: 3.7 mEq/L (ref 3.5–5.1)
Total Protein: 5.8 g/dL — ABNORMAL LOW (ref 6.0–8.3)

## 2010-05-25 LAB — CBC
MCV: 89.5 fL (ref 78.0–100.0)
Platelets: 138 10*3/uL — ABNORMAL LOW (ref 150–400)
RBC: 4.19 MIL/uL (ref 3.87–5.11)
WBC: 9.4 10*3/uL (ref 4.0–10.5)

## 2010-05-25 LAB — PHOSPHORUS
Phosphorus: 2.5 mg/dL (ref 2.3–4.6)
Phosphorus: 3.3 mg/dL (ref 2.3–4.6)

## 2010-05-25 LAB — CK TOTAL AND CKMB (NOT AT ARMC)
CK, MB: 1.8 ng/mL (ref 0.3–4.0)
Relative Index: INVALID (ref 0.0–2.5)
Total CK: 93 U/L (ref 7–177)

## 2010-05-25 LAB — TSH: TSH: 2.851 u[IU]/mL (ref 0.350–4.500)

## 2010-05-25 LAB — TROPONIN I: Troponin I: <0.01 ng/mL (ref 0.00–0.06)

## 2010-05-25 LAB — MAGNESIUM: Magnesium: 1.8 mg/dL (ref 1.5–2.5)

## 2010-05-31 LAB — CULTURE, BLOOD (ROUTINE X 2)
Culture  Setup Time: 201202051118
Culture: NO GROWTH

## 2010-06-01 NOTE — H&P (Signed)
NAMECORINDA, AMMON                 ACCOUNT NO.:  [id number]  MEDICAL RECORD NO.:  [id number]           PATIENT TYPE:  E  LOCATION:  [facility]                         FACILITY:  [facility]  PHYSICIAN:  Michiel Cowboy, MDDATE OF BIRTH:  May 31, 1922  DATE OF ADMISSION:  05/24/2010 DATE OF DISCHARGE:                             HISTORY & PHYSICAL   PRIMARY CARE PHYSICIAN:  Valerie A. Felicity Coyer, MD  CARDIOLOGIST:  Nanetta Batty, MD  CHIEF COMPLAINT:  Cough, fever, and some shortness of breath.  HISTORY OF PRESENT ILLNESS:  The patient is an 77 year old female with history of coronary artery disease and hypertension.  Since Tuesday she has been having chills, low-grade fever, mild cough, and overall feeling unwell with some worsening shortness of breath lately which brought into the emergency department.  In ER, she was found to have right upper lobe pneumonia and was found to be also febrile at which point triad hospitalist was called for an admission.  The patient otherwise denies any chest pain.  No lower extremity edema.  No nausea.  No vomiting.  No diarrhea.  REVIEW OF SYSTEMS:  Otherwise review of systems is negative.  PAST MEDICAL HISTORY: 1. Significant for coronary artery disease. 2. Hypertension. 3. Hypothyroidism. 4. Anxiety.  SOCIAL HISTORY:  The patient used to be a smoker, but quit about 30 years ago.  Does not drink or abuse drugs.  Family is helpful and support if at the bedside.  FAMILY HISTORY:  Noncontributory.  ALLERGIES:  CODEINE, DEMEROL, and PERCOCET.  MEDICATIONS: 1. Aspirin 81 mg daily. 2. Azor 10/40 mg the patient takes only one half a tablet daily. 3. Klonopin 0.5 mg q.6 h. as needed for anxiety and 1 mg at bedtime     scheduled. 4. Lipitor 80 mg daily. 5. Prilosec 20 mg daily. 6. Synthroid 88 mcg daily. 7. Bisoprolol 5 mg daily. 8. Remeron 30 mg daily. 9. Seroquel 50 mg daily.  PHYSICAL EXAMINATION:  VITAL SIGNS:  Temperature, T max 102.4,  blood pressure 156/62, pulse 107, respirations 27, 92% on room air. GENERAL:  The patient appears to be in no acute distress. HEENT:  Nontraumatic.  Slightly dry mucous membranes. LUNGS:  Clear to auscultation on the left, but on the right I do appreciate bronchial sounds.  No wheezes. GI:  Abdomen is soft, nontender, and nondistended. EXTREMITIES:  lower extremities without clubbing, cyanosis, or edema. SKIN: Clean, dry, and intact. NEUROLOGIC:  Grossly intact.  LABORATORY DATA:  White blood cell count 8.1, hemoglobin is 13.5, sodium 133, potassium 4.1, creatinine of 1.27.  UA showing slight hematuria, but no evidence of infection.  Chest x-ray showing acute right upper lobe pneumonia.  EKG not having some nonspecific changes and particularly T-wave inversion in lead V2.  ASSESSMENT/PLAN:  This is an 77 year old female with community-acquired pneumonia, right upper lobe.  We will start on Rocephin and Zithromax. She has already blood cultures drawn.  Given Mucinex as needed.  Give IV fluids, manage her fever.  History of coronary artery disease as some minor changes on EKG, but for completion we will cycle cardiac enzymes, especially since she has  had some shortness of breath, although I suspect strongly that this is pneumonia related diagnosis.  Hypertension.  Continue bisoprolol and Azor.  Mild dehydration based on her labs.  We will give gentle IV fluids.  Mild hematuria, we will repeat urinalysis.  Prophylaxis, Protonix subcutaneous.  CODE STATUS:  The patient wished to be do not resuscitate, do not intubate.     Michiel Cowboy, MD     AVD/MEDQ  D:  05/24/2010  T:  05/25/2010  Job:  [id number]  cc:   Vikki Ports A. Felicity Coyer, MD  Electronically Signed by Therisa Doyne MD on 05/31/2010 07:26:02 PM

## 2010-06-01 NOTE — Discharge Summary (Signed)
  [name], [name]                 ACCOUNT NO.:  [id number]  MEDICAL RECORD NO.:  [id number]           PATIENT TYPE:  I  LOCATION:  2502                         FACILITY:  [facility]  PHYSICIAN:  Charlestine Massed, MDDATE OF BIRTH:  1922/12/07  DATE OF ADMISSION:  05/24/2010 DATE OF DISCHARGE:  05/26/2010                        DISCHARGE SUMMARY - REFERRING   PRIMARY CARE PHYSICIAN:  Dr. Darryll Capers of [facility].  CARDIOLOGY:  Dr. Nanetta Batty.  REASON FOR ADMISSION:  Cough and fever.  DISCHARGE DIAGNOSES: 1. Right upper lobe community-acquired pneumonia. 2. Shortness of breath secondary to pneumonia, currently resolved. 3. History of coronary artery disease. 4. Hypertension. 5. Hypothyroidism.  DISCHARGE MEDICATIONS: 1. Robitussin-DM syrup 5 mL p.o. every 4 hourly p.r.n. for dry cough. 2. Levofloxacin 500 mg p.o. daily for five more days, last date     May 31, 2010. 3. Enteric coated aspirin 81 mg per daily. 4. Azor 10/40 half tablet p.o. at bedtime. 5. Bisoprolol 5 mg p.o. bedtime. 6. Calcium and vitamin D 1 tablet p.o. daily in a.m. 7. Klonopin 1 mg tablet, half tablet at 6:00 p.m. and 1 tablet at     bedtime. 8. Lipitor 80 mg p.o. at bedtime. 9. Multivitamins 1 tablet every a.m. 10.Prilosec 20 mg p.o. every a.m. 11.Remeron 30 mg p.o. at bedtime. 12.Seroquel 50 mg p.o. at bedtime. 13.Synthroid 88 mcg p.o. every a.m. 14.Vitamin B12, 500 mcg 1 tablet p.o. daily next. 15.Vitamin E 400 units p.o. every a.m.  HOSPITAL COURSE: 1. Pneumonia.  Ms. [name] is an 77 year old [place] Caucasian     female, who is mostly dependent in ADL and AADLs wise and who also     comes to the hospital, brought in by her latest for cough and     shortness of breath.  She did not make much of sputum.  Chest x-ray     revealed a significant right upper lobe pneumonia on the peripheral     aspect.  She was started on antibiotics and improved considerably     within 24  hours.  She is now ambulating without any oxygen support.     In view of this she is being discharged home with p.o. medications     to follow up with Dr. Darryll Capers on Friday or next Monday. 2. Cardiac status, currently stable.  Blood pressures were controlled.     We will continue the same medications at this time.  Labs done during this hospital stay: 1. Blood cultures no growth so far. 2. Cardiac enzymes are negative.  DISPOSITION:  Discharged back home.  FOLLOW UP: 1. Follow up with Dr. Darryll Capers within this week. 2. Follow up with Dr. Nanetta Batty as per prior appointment.  A total of 45 minutes spent on the discharge.     Charlestine Massed, MD     UT/MEDQ  D:  05/26/2010  T:  05/26/2010  Job:  [id number]  cc:   Stacie Glaze, MD Nanetta Batty, M.D.  Electronically Signed by Charlestine Massed MD on 05/31/2010 11:45:34 AM

## 2010-06-05 NOTE — Assessment & Plan Note (Signed)
Summary: 4 mo rov/mm   Vital Signs:  Patient profile:   77 year old female Height:      64 inches Weight:      150 pounds BMI:     25.84 Temp:     98.2 degrees F oral Pulse rate:   80 / minute Resp:     14 per minute BP sitting:   120 / 70  (left arm)  Vitals Entered By: Willy Eddy, LPN (May 12, 2010 3:22 PM) CC: roa- compeltes cipro tomorrow for uti, Hypertension Management Is Patient Diabetic? No   Primary Care Provider:  Darryll Capers, MD  CC:  roa- compeltes cipro tomorrow for uti and Hypertension Management.  History of Present Illness: The pt had a recent uti and the infection is ecoli ( sensitive to  cipro) the pt's symptoms have improved still feels week... no fever but has noted back pain Was treated  for 7 days She  had uri and was given a MDI and she did not use it  Hypertension History:      She denies headache, chest pain, palpitations, dyspnea with exertion, orthopnea, PND, peripheral edema, visual symptoms, neurologic problems, syncope, and side effects from treatment.        Positive major cardiovascular risk factors include female age 28 years old or older, hyperlipidemia, and hypertension.  Negative major cardiovascular risk factors include non-tobacco-user status.        Positive history for target organ damage include ASHD (either angina/prior MI/prior CABG).  Further assessment for target organ damage reveals no history of stroke/TIA or peripheral vascular disease.     Preventive Screening-Counseling & Management  Alcohol-Tobacco     Smoking Status: never     Tobacco Counseling: not indicated; no tobacco use  Problems Prior to Update: 1)  Uti  (ICD-599.0) 2)  Acute Bronchitis  (ICD-466.0) 3)  Cough  (ICD-786.2) 4)  Frequency, Urinary  (ICD-788.41) 5)  Shingles, Recurrent  (ICD-053.9) 6)  Tachycardia  (ICD-785.0) 7)  Barrett's Esophagus  (ICD-530.85) 8)  Dysuria  (ICD-788.1) 9)  Closed Fracture Unspec Part Upper End Humerus   (ICD-812.00) 10)  Low Back Pain  (ICD-724.2) 11)  Bursitis, Acromioclavicular, Right  (ICD-726.10) 12)  Insomnia, Chronic  (ICD-307.42) 13)  Neck Pain, Right  (ICD-723.1) 14)  Hypothyroidism  (ICD-244.9) 15)  Hypertension  (ICD-401.9) 16)  Hyperlipidemia  (ICD-272.4) 17)  Anxiety  (ICD-300.00)  Current Problems (verified): 1)  Uti  (ICD-599.0) 2)  Acute Bronchitis  (ICD-466.0) 3)  Cough  (ICD-786.2) 4)  Frequency, Urinary  (ICD-788.41) 5)  Shingles, Recurrent  (ICD-053.9) 6)  Tachycardia  (ICD-785.0) 7)  Barrett's Esophagus  (ICD-530.85) 8)  Dysuria  (ICD-788.1) 9)  Closed Fracture Unspec Part Upper End Humerus  (ICD-812.00) 10)  Low Back Pain  (ICD-724.2) 11)  Bursitis, Acromioclavicular, Right  (ICD-726.10) 12)  Insomnia, Chronic  (ICD-307.42) 13)  Neck Pain, Right  (ICD-723.1) 14)  Hypothyroidism  (ICD-244.9) 15)  Hypertension  (ICD-401.9) 16)  Hyperlipidemia  (ICD-272.4) 17)  Anxiety  (ICD-300.00)  Medications Prior to Update: 1)  Prilosec 20 Mg  Cpdr (Omeprazole) .... Once Daily 2)  Lipitor 80 Mg  Tabs (Atorvastatin Calcium) .... At Bedtime 3)  Synthroid 88 Mcg  Tabs (Levothyroxine Sodium) .Marland Kitchen.. 1 Tab Before Breakfast 4)  Adult Aspirin Ec Low Strength 81 Mg  Tbec (Aspirin) .... Once Daily 5)  Klonopin 1 Mg  Tabs (Clonazepam) .... 1/2 Po Q 6 Pm and Then One Whole Klonopin At Bedtime 6)  Azor 10-40 Mg  Tabs (Amlodipine-Olmesartan) .... 1/2 By Mouth Daily 7)  Calcium 600/vitamin D 600-200 Mg-Unit  Tabs (Calcium Carbonate-Vitamin D) .... Once Daily 8)  Vitamin B-12 500 Mcg  Tabs (Cyanocobalamin) .... Once Daily 9)  Bl Vitamin E 400 Unit  Caps (Vitamin E) .... Once Daily 10)  Therapeutic Multivitamin   Tabs (Multiple Vitamin) .... Once Daily 11)  Remeron 30 Mg Tabs (Mirtazapine) .... One By Mouth Q Hs 12)  Bisoprolol Fumarate 5 Mg Tabs (Bisoprolol Fumarate) .... One By Mouth Daily 13)  Seroquel 50 Mg Tabs (Quetiapine Fumarate) .... One By Mouth Q Hs  Rx Number [id number] 14)   Qvar 80 Mcg/act Aers (Beclomethasone Dipropionate) .... 2 Puffs By Mouth Two Times A Day 15)  Proventil Hfa 108 (90 Base) Mcg/act Aers (Albuterol Sulfate) .Marland Kitchen.. 1-2 Puffs By Mouth Q 6 Hours As Needed Sob/cough/wheeze 16)  Tessalon Perles 100 Mg Caps (Benzonatate) .Marland Kitchen.. 1 Cap By Mouth Three Times A Day As Needed Cough 17)  Align 4 Mg Caps (Probiotic Product) .Marland Kitchen.. 1 Cap By Mouth Once Daily For 30 Days 18)  Ciprofloxacin Hcl 500 Mg Tabs (Ciprofloxacin Hcl) .... One Twice Daily  Current Medications (verified): 1)  Prilosec 20 Mg  Cpdr (Omeprazole) .... Once Daily 2)  Lipitor 80 Mg  Tabs (Atorvastatin Calcium) .... At Bedtime 3)  Synthroid 88 Mcg  Tabs (Levothyroxine Sodium) .Marland Kitchen.. 1 Tab Before Breakfast 4)  Adult Aspirin Ec Low Strength 81 Mg  Tbec (Aspirin) .... Once Daily 5)  Klonopin 1 Mg  Tabs (Clonazepam) .... 1/2 Po Q 6 Pm and Then One Whole Klonopin At Bedtime 6)  Azor 10-40 Mg  Tabs (Amlodipine-Olmesartan) .... 1/2 By Mouth Daily 7)  Calcium 600/vitamin D 600-200 Mg-Unit  Tabs (Calcium Carbonate-Vitamin D) .... Once Daily 8)  Vitamin B-12 500 Mcg  Tabs (Cyanocobalamin) .... Once Daily 9)  Bl Vitamin E 400 Unit  Caps (Vitamin E) .... Once Daily 10)  Therapeutic Multivitamin   Tabs (Multiple Vitamin) .... Once Daily 11)  Remeron 30 Mg Tabs (Mirtazapine) .... One By Mouth Q Hs 12)  Bisoprolol Fumarate 5 Mg Tabs (Bisoprolol Fumarate) .... One By Mouth Daily 13)  Seroquel 50 Mg Tabs (Quetiapine Fumarate) .... One By Mouth Q Hs  Rx Number [id number]  Allergies (verified): 1)  ! Demerol 2)  ! Tylox 3)  ! Codeine  Past History:  Family History: Last updated: 06/13/2008 Family History High cholesterol Family History Hypertension No FH of Colon Cancer: Family History of Diabetes: Son  Social History: Last updated: 06/13/2008 Married Never Smoked widowed Alcohol Use - no Daily Caffeine Use 1 can Pepsi in AM Illicit Drug Use - no  Risk Factors: Smoking Status: never  (05/12/2010)  Past medical, surgical, family and social histories (including risk factors) reviewed, and no changes noted (except as noted below).  Past Medical History: Reviewed history from 06/13/2008 and no changes required. Insomnia Anxiety Hyperlipidemia Hypertension Hypothyroidism Low back pain L wrist fracture, 09/2007 R shoulder fracture, 02/2008 GERD Barretts Esophagus Hemorrhoids Coronary Artery Disease Hiatal hernia Hyperplastic colon polyps 06/2005  Past Surgical History: Reviewed history from 01/17/2007 and no changes required. Coronary artery bypass graft  1995 Carotid endarterectomy Cataract extraction  Family History: Reviewed history from 06/13/2008 and no changes required. Family History High cholesterol Family History Hypertension No FH of Colon Cancer: Family History of Diabetes: Son  Social History: Reviewed history from 06/13/2008 and no changes required. Married Never Smoked widowed Alcohol Use - no Daily Caffeine Use 1 can Pepsi in AM Illicit Drug  Use - no  Review of Systems  The patient denies anorexia, fever, weight loss, weight gain, vision loss, decreased hearing, hoarseness, chest pain, syncope, dyspnea on exertion, peripheral edema, prolonged cough, headaches, hemoptysis, abdominal pain, melena, hematochezia, severe indigestion/heartburn, hematuria, incontinence, genital sores, muscle weakness, suspicious skin lesions, transient blindness, difficulty walking, depression, unusual weight change, abnormal bleeding, enlarged lymph nodes, angioedema, and breast masses.    Physical Exam  General:  Well-developed,well-nourished,in no acute distress; alert,appropriate and cooperative throughout examination Head:  Normocephalic and atraumatic without obvious abnormalities. No apparent alopecia or balding. Eyes:  pupils equal and pupils round.   Ears:  External ear exam shows no significant lesions or deformities.  Otoscopic examination reveals  clear canals, tympanic membranes are intact bilaterally without bulging, retraction, inflammation or discharge. Hearing is grossly normal bilaterally. Nose:  mucosal erythema and mucosal edema.   Mouth:  Oral mucosa and oropharynx without lesions or exudates.  Teeth in good repair. Neck:  No deformities, masses, or tenderness noted. Lungs:  Normal respiratory effort, chest expands symmetrically. Lungs are clear to auscultation, no crackles or wheezes. Heart:  Normal rate and regular rhythm. S1 and S2 normal without gallop, murmur, click, rub or other extra sounds. Abdomen:  Bowel sounds positive,abdomen soft and non-tender without masses, organomegaly or hernias noted. Msk:  No deformity or scoliosis noted of thoracic or lumbar spine.   Extremities:  No clubbing, cyanosis, edema, or deformity noted with normal full range of motion of all joints.   Neurologic:  No cranial nerve deficits noted. Station and gait are normal. Plantar reflexes are down-going bilaterally. DTRs are symmetrical throughout. Sensory, motor and coordinative functions appear intact.   Impression & Recommendations:  Problem # 1:  FREQUENCY, URINARY (ICD-788.41) Assessment Unchanged  Discussed use of medication.   Problem # 2:  HYPERTENSION (ICD-401.9) Assessment: Improved  Her updated medication list for this problem includes:    Azor 10-40 Mg Tabs (Amlodipine-olmesartan) .Marland Kitchen... 1/2 by mouth daily    Bisoprolol Fumarate 5 Mg Tabs (Bisoprolol fumarate) ..... One by mouth daily  BP today: 120/70 Prior BP: 120/78 (01/28/2010)  Prior 10 Yr Risk Heart Disease: N/A (08/01/2008)  Labs Reviewed: Chol: 139 (10/07/2009)   HDL: 46.50 (10/07/2009)   LDL: 63 (08/09/2006)   TG: 122 (08/09/2006)  Problem # 3:  LOW BACK PAIN (ICD-724.2) Assessment: Unchanged  Her updated medication list for this problem includes:    Adult Aspirin Ec Low Strength 81 Mg Tbec (Aspirin) ..... Once daily  Problem # 4:  HYPOTHYROIDISM  (ICD-244.9)  Her updated medication list for this problem includes:    Synthroid 88 Mcg Tabs (Levothyroxine sodium) .Marland Kitchen... 1 tab before breakfast  Labs Reviewed: TSH: 1.28 (10/07/2009)   Free T4: 0.9 (05/30/2008)    Chol: 139 (10/07/2009)   HDL: 46.50 (10/07/2009)   LDL: 63 (08/09/2006)   TG: 122 (08/09/2006)  Complete Medication List: 1)  Prilosec 20 Mg Cpdr (Omeprazole) .... Once daily 2)  Lipitor 80 Mg Tabs (Atorvastatin calcium) .... At bedtime 3)  Synthroid 88 Mcg Tabs (Levothyroxine sodium) .Marland Kitchen.. 1 tab before breakfast 4)  Adult Aspirin Ec Low Strength 81 Mg Tbec (Aspirin) .... Once daily 5)  Klonopin 1 Mg Tabs (Clonazepam) .... 1/2 po q 6 pm and then one whole klonopin at bedtime 6)  Azor 10-40 Mg Tabs (Amlodipine-olmesartan) .... 1/2 by mouth daily 7)  Calcium 600/vitamin D 600-200 Mg-unit Tabs (Calcium carbonate-vitamin d) .... Once daily 8)  Vitamin B-12 500 Mcg Tabs (Cyanocobalamin) .... Once daily 9)  Bl  Vitamin E 400 Unit Caps (Vitamin e) .... Once daily 10)  Therapeutic Multivitamin Tabs (Multiple vitamin) .... Once daily 11)  Remeron 30 Mg Tabs (Mirtazapine) .... One by mouth q hs 12)  Bisoprolol Fumarate 5 Mg Tabs (Bisoprolol fumarate) .... One by mouth daily 13)  Seroquel 50 Mg Tabs (Quetiapine fumarate) .... One by mouth q hs  rx number [id number]  Hypertension Assessment/Plan:      The patient's hypertensive risk group is category C: Target organ damage and/or diabetes.  Today's blood pressure is 120/70.  Her blood pressure goal is < 140/90.  Patient Instructions: 1)  Please schedule a follow-up appointment in 3 months. Prescriptions: KLONOPIN 1 MG  TABS (CLONAZEPAM) 1/2 po q 6 pm and then one whole klonopin at bedtime  #45 x 5   Entered and Authorized by:   Stacie Glaze MD   Signed by:   Stacie Glaze MD on 05/12/2010   Method used:   Print then Give to Patient   RxID:   [id number] LIPITOR 80 MG  TABS (ATORVASTATIN CALCIUM) at bedtime  #90 x 3   Entered  by:   Willy Eddy, LPN   Authorized by:   Stacie Glaze MD   Signed by:   Willy Eddy, LPN on 60/73/7106   Method used:   Print then Give to Patient   RxID:   [id number] REMERON 30 MG TABS (MIRTAZAPINE) one by mouth q HS  #90 x 3   Entered by:   Willy Eddy, LPN   Authorized by:   Stacie Glaze MD   Signed by:   Willy Eddy, LPN on 38/18/2993   Method used:   Print then Give to Patient   RxID:   [id number] BISOPROLOL FUMARATE 5 MG TABS (BISOPROLOL FUMARATE) one by mouth daily  #90 x 3   Entered by:   Willy Eddy, LPN   Authorized by:   Stacie Glaze MD   Signed by:   Willy Eddy, LPN on 02/58/5277   Method used:   Print then Give to Patient   RxID:   [id number]    Orders Added: 1)  Est. Patient Level IV [08676]

## 2010-06-06 ENCOUNTER — Ambulatory Visit (INDEPENDENT_AMBULATORY_CARE_PROVIDER_SITE_OTHER): Payer: Medicare Other | Admitting: Internal Medicine

## 2010-06-06 ENCOUNTER — Encounter: Payer: Self-pay | Admitting: Internal Medicine

## 2010-06-06 VITALS — BP 130/80 | HR 76 | Temp 98.4°F | Resp 14 | Ht 64.0 in | Wt 148.0 lb

## 2010-06-06 DIAGNOSIS — J159 Unspecified bacterial pneumonia: Secondary | ICD-10-CM

## 2010-06-06 DIAGNOSIS — B999 Unspecified infectious disease: Secondary | ICD-10-CM

## 2010-06-06 DIAGNOSIS — I10 Essential (primary) hypertension: Secondary | ICD-10-CM

## 2010-06-06 DIAGNOSIS — R5381 Other malaise: Secondary | ICD-10-CM

## 2010-06-06 DIAGNOSIS — R5383 Other fatigue: Secondary | ICD-10-CM

## 2010-06-06 DIAGNOSIS — J189 Pneumonia, unspecified organism: Secondary | ICD-10-CM

## 2010-06-06 LAB — CBC WITH DIFFERENTIAL/PLATELET
Basophils Absolute: 0.1 10*3/uL (ref 0.0–0.1)
HCT: 45.3 % (ref 36.0–46.0)
Hemoglobin: 14.4 g/dL (ref 12.0–15.0)
Lymphocytes Relative: 32 % (ref 12–46)
Monocytes Absolute: 0.5 10*3/uL (ref 0.1–1.0)
Monocytes Relative: 9 % (ref 3–12)
Neutro Abs: 3.2 10*3/uL (ref 1.7–7.7)
WBC: 5.6 10*3/uL (ref 4.0–10.5)

## 2010-06-06 MED ORDER — MOXIFLOXACIN HCL 400 MG PO TABS: 400.0000 mg | ORAL_TABLET | Freq: Every day | ORAL | Status: AC

## 2010-06-06 NOTE — Patient Instructions (Signed)
Obtained some Mucinex fast max liguid  Cough and cold  One Full 3 times a day for the next 5 days

## 2010-06-06 NOTE — Progress Notes (Signed)
Subjective:    Patient ID: Jasmine [name], female    DOB: 1923-02-26, 77 y.o.   MRN: [id number]  HPI patient is an 77 year old white female who was recently admitted to the hospital for community-acquired pneumonia and discharged on a course of Avelox she presents today for a post followup visit since her discharge from the hospital she has seen her cardiologist and had a stress test because of some chest pain at that time for pneumonia at stress test did not show any progression of disease today she feels fine but feels weak some weakness is progressive she has no cough or shortness of breath at this time and denies any fever or chills.   her hospital course was reviewed and her condition was reviewed with her daughter.   Her allergies and medications were reviewed with her as well as her chronic problems of coronary artery disease hypertension and gastroesophageal reflux  Past Medical History  Diagnosis Date  . Insomnia due to medical condition   . Anxiety   . Hyperlipidemia   . Hypertension   . Hypothyroidism   . Low back pain   . Left wrist fracture 09/2007  . Fracture of right shoulder 02/2008  . GERD (gastroesophageal reflux disease)   . Barrett's esophagus   . Hemorrhoids, external   . CAD (coronary artery disease) of artery bypass graft 1995  . Hiatal hernia   . History of colon polyps    Past Surgical History  Procedure Date  . Colonoscopy w/ polypectomy 06/2005  . Carotid endarterectomy   . Cataract extraction     reports that she has quit smoking. She has never used smokeless tobacco. She reports that she does not drink alcohol or use illicit drugs. family history includes Diabetes in her son.    I have reviewed this patient's past medical history surgical history social history current medication list and current allergy list and have documented appropriate changes as necessary    Review of Systems     On review of systems she is a pleasant elderly white female in  no apparent distress she does appear pale she is moderately weak  She has no complaints of her upper respiratory tract or her lungs she has no complaints of her GI tract other than her chronic constipation she has no chest pain shortness of breath she denies any rashes she does complain of constitutional weakness she is alert and oriented without any neurologic or psychological complaints she has no rashes the rest of the review of systems is negative Objective:   Physical Exam  on physical examination she is a pale elderly white female who appears her stated age his vital signs included a temperature of 98 for a blood pressure of 130/80 HEENT reveal pupils are equal round reactive to light and accommodation her oropharynx was clear with some postnasal drip her neck was supple without adenopathy her lung fields were clear to auscultation to percussion slight dullness at the bases greater on the left than the right and a slight expiratory wheeze on the left base her heart examination revealed a regular rate and rhythm her abdomen is soft extremity examination revealed trace edema neuro neurological exam showed equal grips and normal gait       Assessment & Plan:   the patient presents for post fall up visit for community-acquired pneumonia she completed her antibiotic course but has some residual symptoms that suggest that the pneumonia may not be completely cleared specifically she does have some  dullness at her left base extreme fatigue no cough no fever at this time we will extend the Avelox for 5 additional days and monitor her we will check a CBC differential a day Cipro white count cell count is elevated consider a chest x-ray in the next 2 weeks should her symptoms not completely abate she was given guidance as to her fatigue at that pneumonias a serious infection in the elderly and the fatigue may persist for up to a month following this infection she is to perform gated exercise and monitor her  recovery and contact us should she seemed to decline in functioning.  Her blood pressure is stable her CAD is stable per her cardiologist we reviewed the results of her recent cardiology testing with her she returned to the office in one month for her scheduled appointment

## 2010-08-11 ENCOUNTER — Encounter: Payer: Self-pay | Admitting: Internal Medicine

## 2010-08-11 ENCOUNTER — Ambulatory Visit (INDEPENDENT_AMBULATORY_CARE_PROVIDER_SITE_OTHER): Payer: Medicare Other | Admitting: Internal Medicine

## 2010-08-11 VITALS — BP 150/80 | HR 76 | Temp 98.8°F | Resp 16 | Ht 64.0 in | Wt 150.0 lb

## 2010-08-11 DIAGNOSIS — R531 Weakness: Secondary | ICD-10-CM

## 2010-08-11 DIAGNOSIS — R5381 Other malaise: Secondary | ICD-10-CM

## 2010-08-11 DIAGNOSIS — I10 Essential (primary) hypertension: Secondary | ICD-10-CM

## 2010-08-11 MED ORDER — OMEPRAZOLE 20 MG PO CPDR: 20.0000 mg | DELAYED_RELEASE_CAPSULE | Freq: Every day | ORAL | Status: DC

## 2010-08-11 MED ORDER — LEVOTHYROXINE SODIUM 88 MCG PO TABS: 88.0000 ug | ORAL_TABLET | Freq: Every day | ORAL | Status: DC

## 2010-08-11 MED ORDER — QUETIAPINE FUMARATE 50 MG PO TABS: 50.0000 mg | ORAL_TABLET | Freq: Every day | ORAL | Status: DC

## 2010-08-11 NOTE — Progress Notes (Signed)
Subjective:    Patient ID: Jasmine [name], female    DOB: 1922/11/06, 77 y.o.   MRN: [id number]  HPI The patient presents for return office visit she states that she has a sense of unwellness today some nausea no fever or chills she has not been constipated she has no recent weight loss. Her symptoms are nonspecific but she does have a sense of ill health. She states that she has felt this way since she had pneumonia in January and February.   Review of Systems  Constitutional: Negative for activity change, appetite change and fatigue.  HENT: Negative for ear pain, congestion, neck pain, postnasal drip and sinus pressure.   Eyes: Negative for redness and visual disturbance.  Respiratory: Negative for cough, shortness of breath and wheezing.   Gastrointestinal: Positive for abdominal pain and abdominal distention.  Genitourinary: Negative for dysuria, frequency and menstrual problem.  Musculoskeletal: Negative for myalgias, joint swelling and arthralgias.  Skin: Negative for rash and wound.  Neurological: Negative for dizziness, weakness and headaches.  Hematological: Negative for adenopathy. Does not bruise/bleed easily.  Psychiatric/Behavioral: Negative for sleep disturbance and decreased concentration.       Past Medical History  Diagnosis Date  . Insomnia due to medical condition   . Anxiety   . Hyperlipidemia   . Hypertension   . Hypothyroidism   . Low back pain   . Left wrist fracture 09/2007  . Fracture of right shoulder 02/2008  . GERD (gastroesophageal reflux disease)   . Barrett's esophagus   . Hemorrhoids, external   . CAD (coronary artery disease) of artery bypass graft 1995  . Hiatal hernia   . History of colon polyps    Past Surgical History  Procedure Date  . Colonoscopy w/ polypectomy 06/2005  . Carotid endarterectomy   . Cataract extraction     reports that she quit smoking about 42 years ago. She has never used smokeless tobacco. She reports that she does  not drink alcohol or use illicit drugs. family history includes Diabetes in her son. Allergies  Allergen Reactions  . Codeine   . Meperidine Hcl   . Oxycodone-Acetaminophen     Objective:   Physical Exam  Constitutional: She is oriented to person, place, and time. She appears well-developed and well-nourished. No distress.  HENT:  Head: Normocephalic and atraumatic.  Right Ear: External ear normal.  Left Ear: External ear normal.  Nose: Nose normal.  Mouth/Throat: Oropharynx is clear and moist.  Eyes: Conjunctivae and EOM are normal. Pupils are equal, round, and reactive to light.  Neck: Normal range of motion. Neck supple. No JVD present. No tracheal deviation present. No thyromegaly present.  Cardiovascular: Normal rate, regular rhythm, normal heart sounds and intact distal pulses.   No murmur heard. Pulmonary/Chest: Effort normal and breath sounds normal. She has no wheezes. She exhibits no tenderness.  Abdominal: Soft. Bowel sounds are normal. She exhibits no mass. There is tenderness. There is no guarding.  Musculoskeletal: Normal range of motion. She exhibits no edema and no tenderness.  Lymphadenopathy:    She has no cervical adenopathy.  Neurological: She is alert and oriented to person, place, and time. She has normal reflexes. No cranial nerve deficit.  Skin: Skin is warm and dry. She is not diaphoretic.  Psychiatric: She has a normal mood and affect. Her behavior is normal.          Assessment & Plan:  The patient has had a CT is recently and has been eating  strawberries I would suggest to her that consumption of too many strawberries without food will cause acidity in the stomach and irritation I think that her recovery from the pneumonia has been slow but is consistent with her age she will need to be patient exercise eat correctly and she should recover fully from her pneumonia.  Her blood pressure slightly elevated today I would suggest that this is associated  with a sense of ill feeling. Her blood pressure repeat was 130/80 and her pulse decreased to 66.  I think this represents deconditioning and the patient needs to begin a regular exercise program she needs to understand that mobility is the key to preventing falls and prolonging life

## 2010-08-19 ENCOUNTER — Encounter: Payer: Self-pay | Admitting: Internal Medicine

## 2010-09-02 NOTE — Assessment & Plan Note (Signed)
[facility]                         GASTROENTEROLOGY OFFICE NOTE   NAME:Depuy, Arvella A                        MRN:          [id number]  DATE:08/01/2007                            DOB:          [birth year]    Mrs.  Dapper had an episode of chest pain for approximately two days in  mid March.  Her symptoms improved with the use of Maalox and Tums.  She  was concerned about a cardiac etiology and she was evaluated by a  Kaydan Wilhoite at Dr. Clayborne Dana office.  Apparently, they did not feel  that her symptoms were cardiac related.  She states she had a negative  stress test in December 2008.  She has had no recurrent chest pain  symptoms since that time and she feels here symptoms were most likely  related to indigestion.  She has rarely had flares of GERD and her  symptoms have remained under good control on Protonix.  She has a  history of GERD.  A subtle area of Barrett's has been noted on prior  endoscopy.  She has no dysphagia, odynophagia, abdominal pain, nausea,  vomiting, change in bowel habits, melena or hematochezia.  She denies  shortness of breath, exertional chest pain or radiation of the chest  pain to her neck, jaw or arms.   CURRENT MEDICATIONS:  Listed on the chart, updated and reviewed.   ALLERGIES:  CODEINE.   PHYSICAL EXAMINATION:  VITAL SIGNS:  Weight 148.2 pounds, blood pressure  98/54, pulse 84 and regular.  GENERAL APPEARANCE:  No acute distress.  HEENT:  Sclerae are anicteric.  Oropharynx clear.  CHEST:  Clear to auscultation bilaterally with no chest wall tenderness.  CARDIOVASCULAR:  Regular rate and rhythm without murmurs appreciated.  ABDOMEN:  Soft, nontender, nondistended, normal active bowel sounds.  No  palpable organomegaly, masses or hernias.   ASSESSMENT/PLAN:  Episode of chest pain.  Possible flare of  gastroesophageal reflux disease. Barrett's esophagus.  She is advised to  maintain a proton pump inhibitor on a  daily basis indefinitely.  She  states that she will be changed to omeprazole 20 mg p.o. q.a.m. due to  insurance coverage and cost reasons.  She is to maintain all standard  antireflux measures.  She may use Tums, Rolaids or Maalox on a p.r.n.  basis for breakthrough symptoms.  Given her possible history of  Barrett's noted on prior biopsies, we will plan to proceed with upper  endoscopy and possible biopsy in March 2010.  Ongoing follow-up with Dr.  Darryll Capers.     Venita Lick. Russella Dar, MD, [facility]  Electronically Signed    MTS/MedQ  DD: 08/01/2007  DT: 08/01/2007  Job #: [id number]   cc:   Stacie Glaze, MD

## 2010-09-02 NOTE — Discharge Summary (Signed)
NAMEJANNINE, Jasmine [name]                 ACCOUNT NO.:  [id number]   MEDICAL RECORD NO.:  [id number]           PATIENT TYPE:   LOCATION:                                 FACILITY:   PHYSICIAN:  Georgina Quint. Plotnikov, MDDATE OF BIRTH:  1922/04/30   DATE OF ADMISSION:  DATE OF DISCHARGE:                               DISCHARGE SUMMARY   DISCHARGE MEDICATIONS:  1. Zithromax 250 mg once daily for 2 days.  2. Ceftin 250 mg 1 twice a day for 10 days and resume other home      medicines.   FOLLOWUP APPOINTMENTS:  Dr. Lovell Sheehan in 2 weeks, Dr. Allyson Sabal as scheduled.  Continue physical therapy at Dr. Ollen Gross office.   Resume activities slowly.   DIET:  Start previous.   SPECIAL INSTRUCTIONS:  Call if problems.   CONSULTATIONS:  Cardiology, Nanetta Batty, MD   SPECIAL TESTS:  CT of the chest.   DISCHARGE DIAGNOSES:  1. Right upper lobe pneumonia.  2. Urinary tract infection.  3. Dark stool which was tested guaiac negative.  4. The urine culture with Escherichia coli.  5. Gastroesophageal reflux disease.  6. Dyslipidemia.  7. Humeral fracture on the right in October 2009.  8. Coronary artery disease.   HISTORY:  The patient is an 77 year old female who was admitted by Dr.  Adela Glimpse on April 03, 2008, with shortness of breath and urinary tract  infection.  For the details, please address to her history and physical  on April 03, 2008.   HOSPITAL COURSE:  During the course of her hospitalization, the patient  was treated with IV antibiotics.  Her condition has gradually improved.  Her CT scan did not show PE.  EKG was normal.  On the day of discharge,  she was feeling well.  No active complaints.   PHYSICAL EXAMINATION:  VITAL SIGNS:  Blood pressure 108/60, heart rate  77, respirations 16, temperature 97.2, O2 sat 94% on room air.  GENERAL:  She is awake, alert, and cooperative, in no acute distress.  Right shoulder in a sling.  HEART:  Regular with occasional irregular beats.  LUNGS:   Clear.  No wheezes.  ABDOMEN:  Soft, nontender.  LOWER EXTREMITIES:  Without edema.  Pulses normal.  SKIN:  Clear.   LABORATORY DATA:  Stool guaiac negative.  Urine culture with E. coli.  Other labs as above.  Her EKG with normal sinus rhythm, low voltage,  nonspecific T-wave abnormality.      Georgina Quint. Plotnikov, MD  Electronically Signed     AVP/MEDQ  D:  04/06/2008  T:  04/07/2008  Job:  [id number]   cc:   Dr. Lovell Sheehan

## 2010-09-02 NOTE — H&P (Signed)
Jasmine [name], Jasmine [name]                 ACCOUNT NO.:  [id number]   MEDICAL RECORD NO.:  [id number]          PATIENT TYPE:  INP   LOCATION:  1824                         FACILITY:  [facility]   PHYSICIAN:  Michiel Cowboy, MDDATE OF BIRTH:  02-22-1923   DATE OF ADMISSION:  04/03/2008  DATE OF DISCHARGE:                              HISTORY & PHYSICAL   PRIMARY CARE Parilee Hally:  Dr. Lovell Sheehan of .   HISTORY OF PRESENT ILLNESS:  The patient is an 77 year old female with a  history of coronary artery disease who was at her baseline of health up  until this morning when she woke up and was not feeling quite well.  She  still could not quite put a finger on it.  Of note, yesterday her  primary care physician's office called her to let her know that she will  need to have a stress test done next week.  That got her very upset and  anxious, and she was having a lot of anxiety since then.  Today  throughout the day, she had been having occasional episodes of shortness  of breath that would come and go, usually occurring at rest, lasting  about 5 minutes each.  She had a few episodes.  No chest pain, just  shortness of breath.  In the past, she had a history of MI with also a  similar sensation but more severe.   REVIEW OF SYSTEMS:  Otherwise, review of systems unremarkable.  No  fevers.  She did suffer a fracture of her arm some time ago and is  supposed to be either a splint or sling; she is not quite sure.  No  diarrhea.  No nausea or vomiting.  No constipation.  No diaphoresis.  She did state that there was a little chill.  Otherwise unremarkable.   SOCIAL HISTORY:  The patient does not smoke or drink and does not use  drugs.  Lives at home.   FAMILY HISTORY:  Noncontributory.   PAST MEDICAL HISTORY:  1. Coronary artery disease.  2. Hypertension.  3. Hypothyroidism.  4. GERD.  5. Anxiety disorder.   ALLERGIES:  1. CODEINE.  2. DEMEROL.  3. PERCOCET.   MEDICATIONS:  1. Lexapro  20 mg per day.  2. Lipitor 80 mg daily.  3. Synthroid 88 mcg p.o. daily.  4. Prilosec 20 mg daily.  5. Klonopin 1.5 mg at night.  6. Azor 10/40 mg daily.  7. Seroquel 50 mg daily.  8. Calcium.  9. Vitamin B complex.  10.Halcion 0.25 mg q.h.s.  11.Aspirin 81 mg a day.  12.Vitamin E.  13.Multivitamins.  14.Hydrocodone/acetaminophen 5/325 mg every 4 hours as needed for      pain.   PHYSICAL EXAMINATION:  VITAL SIGNS:  Temperature 97.9, blood pressure  141/64, pulse 74, respirations 16, satting 97% room air.  GENERAL:  The patient appears to be in no acute distress, pleasant  female sitting in bed.  HEENT:  Head nontraumatic.  Somewhat dryish mucous membranes.  LUNGS:  Clear to auscultation bilaterally.  HEART:  Regular rate and rhythm.  No murmurs,  rubs or gallops.  ABDOMEN:  Soft, nontender, nondistended.  EXTREMITIES:  Lower extremities without clubbing, cyanosis or edema.  NEUROLOGIC:  Strength 5/5 in all four extremities.  Cranial nerves  intact.   LABORATORY DATA:  White blood cell count 5.3, hemoglobin 15, sodium 141,  potassium 3.7, creatinine 1.3.  Cardiac markers - i-STAT within normal  limits.  UA showing numerous white blood cell count and bacteria.  EKG  showed normal sinus rhythm, although the read is atrial fibrillation,  but there is a regular rhythm.  There are small P waves noted.  There is  an overall decreased QRS.  Chest x-ray within normal limits.   ASSESSMENT AND PLAN:  This is an 77 year old female presenting with  transient shortness of breath and urinary tract infection, status post  fracture and also in a situation of increased stress.  1. Shortness of breath.  It could be potentially anginal equivalent.      She was supposed to have a stress done as an outpatient.  Perhaps      we can get it done sooner as an inpatient.  Meanwhile, will cycle      cardiac enzymes, admit to telemetry, obtain serial ECGs, check TSH,      fasting lipid panel, hemoglobin  A1c.  2. Urinary tract infection.  Will cover with Cipro, awaiting results      of urine culture.  3. History of hypertension.  Continue Azor.  4. Hyperlipidemia.  Continue statin.  5. History of gastroesophageal reflux disease.  Will continue      Protonix.  6. Recently arm fracture.  Will need to clarify in the morning who the      patient has been seeing for this and perhaps what are the      continuous goals of care.  Will continue pain management for now      and PT/OT if okayed by orthopedics.  7. Slightly elevated creatinine.  Will see if it improves with gentle      hydration.  8. Prophylaxis with Protonix plus Lovenox.  9. Code status.  The patient wished to be DNR/DNI which was discussed      with her.   Dr. Lovell Sheehan and partners to assume care in the morning.      Michiel Cowboy, MD  Electronically Signed     AVD/MEDQ  D:  04/03/2008  T:  04/03/2008  Job:  [id number]   cc:   Dr. Lovell Sheehan of [facility]

## 2010-09-05 NOTE — H&P (Signed)
[place]. [facility]  Patient:    Jasmine [name], Jasmine [name]                          MRN: [id number] Adm. Date:  06/05/00 Attending:  Justine Null, M.D. [facility]                         History and Physical  REASON FOR ADMISSION:  Angina.  HISTORY OF PRESENT ILLNESS:  The patient is a 77 year old woman who for the past two weeks every morning has had an episode of moderate intensity excessive diaphoresis, associated with nausea and a productive cough.  She is unable to cite a precipitating or a relieving factor.  The symptoms last for about 30 minutes at a time.  Today, 911 was called, and the patient was transported to [facility] Emergency Department.  PAST MEDICAL HISTORY: 1. Coronary artery bypass grafting in 1997. 2. Hypertension. 3. Depression. 4. Hypothyroidism. 5. Anxiety. 6. Gastroesophageal reflux disease. 7. Osteoarthritis of the neck.  CURRENT MEDICATIONS: 1. Prilosec. 2. Norvasc. 3. Xanax. 4. Synthroid. 5. Celexa. 6. Tenormin, all at uncertain dosages.  ALLERGIES:  CODEINE.  SOCIAL HISTORY:  The patient is married.  She is retired.  Her husband and daughter are with her.  FAMILY HISTORY:  No one else at home is ill.  REVIEW OF SYSTEMS:  Denies hematuria, bright red blood per rectum, chest pain, shortness of breath, fever, vomiting, neck pain, abdominal pain, and dysuria.  PHYSICAL EXAMINATION:  VITAL SIGNS:  Blood pressure 176/99, heart rate 63, respirations 12, temperature 97.3 degrees.  GENERAL:  In no distress.  SKIN:  Not diaphoretic.  There is no skin rash.  NODES:  No palpable lymphadenopathy.  HEENT:  Head atraumatic.  Sclerae anicteric.  Pharynx clear.  NECK:  Supple.  CHEST:  Clear to auscultation.  CARDIOVASCULAR:  No jugular venous distention or edema.  A regular rate and rhythm, no murmur.  Pedal pulses intact, and carotids show no bruits.  ABDOMEN:  Soft, nontender.  No hepatosplenomegaly, no  mass.  BREASTS/GYNECOLOGIC/RECTAL:  Examinations not done at this time, due to the patients condition.  EXTREMITIES:  No obvious deformities of the joints.  The extremities are warm to touch.  NEUROLOGIC:  Alert, well-oriented.  Moves all fours.  Cranial nerves are grossly intact.  LABORATORY DATA:  Pending.  Electrocardiogram:  Shows inverted T-waves in some anterior leads.  Chest x-ray:  No acute disease.  IMPRESSION: 1. Possible angina. 2. Hypertension. 3. Other chronic medical problems as noted above.  PLAN: 1. Check CPKs. 2. Cardiolyte. 3. Add ACE inhibitor. 4. Aspirin. 5. Full code, at the request of the patient and her family. DD:  06/05/00 TD:  06/05/00 Job: [id number]

## 2010-09-05 NOTE — Discharge Summary (Signed)
[place]. [facility]  Patient:    Jasmine [name], Jasmine [name]                        MRN: [id number] Adm. Date:  [id number] Disc. Date: [id number] Attending:  Justine Null Dictator:   Cornell Barman, P.A. CC:         Justine Null, M.D. [facility]  Runell Gess, M.D.   Discharge Summary  DISCHARGE DIAGNOSIS:  Atypical chest pain.  BRIEF ADMISSION HISTORY:  [name] is a 77 year old white female who was admitted with complaints of chest discomfort for two weeks prior to this admission.  She had had associated diaphoresis, nausea, and burning in both arms and hands.  She also complained of dizziness.  PAST MEDICAL HISTORY: 1. Coronary artery disease, status post CABG in 1997. 2. Peripheral vascular disease, status post right carotid endarterectomy in    1997. 3. Hypertension. 4. Hypothyroidism. 5. Depression and anxiety. 6. Gastroesophageal reflux disease. 7. Osteoarthritis. 8. Hyperlipidemia.  LABORATORY DATA ON ADMISSION:  Cardiac enzymes were negative.  Hemoglobin 15. CMET was essentially normal.  Chest x-ray showed no active disease.  HOSPITAL COURSE: #1 - CARDIOVASCULAR:  The patient has a history of coronary artery disease. She was admitted to rule out myocardial infarction.  MI was ruled out.  We did ask for cardiology to see the patient and they recommended a treadmill stress test.  This was negative for ischemia and they did not feel that any further work-up was indicated.  No cardiac catheterization was indicated at this time.  #2 - HYPERTENSION:  The patients blood pressure was difficult to control. Her Norvasc has been increased.  We will have her follow up with her primary physician for further monitoring.  #3 - HYPERCHOLESTEROLEMIA:  We will have her resume her Lipitor at discharge.  #4 - HYPOTHYROIDISM:  The patients TSH was normal.  #5 - UPPER RESPIRATORY TRACT INFECTION:  This is likely viral.  As noted, the chest x-ray did  not show any active disease.  She has remained afebrile.  The white count is normal.  She has been instructed to treat her viral infection with Tylenol and over-the-counter medications as needed.  We will also give her some Guaifenesin at discharge.  LABORATORY DATA AT DISCHARGE:  BMET was normal.  Hepatic function profile was normal.  The total cholesterol was elevated at 226, triglycerides 134, HDL 52, and LDL 147.  The TSH was 2.211.  MEDICATIONS AT DISCHARGE: 1. Prilosec 20 mg q.d. 2. Lipitor 20 mg q.d. 3. Synthroid 50 mcg one half tablet q.d. 4. Aspirin 81 mg q.d. 5. Xanax 0.25 mg q.12h. p.r.n. 6. Norvasc 10 mg q.d.  Notice the increased dose. 7. Atenolol 25 mg q.d. 8. Guaifenesin 600 mg two tablets b.i.d.  FOLLOW-UP:  The patient is to follow up with Stacie Glaze, M.D., in two to three weeks and Runell Gess, M.D., as needed. DD:  06/09/00 TD:  06/10/00 Job: [id number]

## 2010-09-05 NOTE — Cardiovascular Report (Signed)
NAMEANAKA, BEAZER                           ACCOUNT NO.:  [id number]   MEDICAL RECORD NO.:  [id number]                   PATIENT TYPE:  INP   LOCATION:  3732                                 FACILITY:  [facility]   PHYSICIAN:  Nanetta Batty, M.D.                DATE OF BIRTH:  [birth year]   DATE OF PROCEDURE:  05/21/2003  DATE OF DISCHARGE:                              CARDIAC CATHETERIZATION   PROCEDURE:  Cardiac catheterization.   CARDIOLOGIST:  Nanetta Batty, M.D.   INDICATIONS:  Ms. Stopher is a 77 year old female who is 8 years status post  coronary artery bypass grafting.  She also has PVOD.  She has complained of  several days of chest pain radiating to her left upper extremity with  nausea.  Her other problems include hypertension and hyperlipidemia.  She  presented to the [facility] with chest pain.  There are no  acute EKG changes.  Her first set of cardiac markers were negative.  She  presented for diagnostic coronary arteriography.   DESCRIPTION OF PROCEDURE:  The patient was brought to the second floor [facility] in the postabsorptive state.  She was  premedicated with p.o. Valium and IV Nubain.  Her right groin was prepped  and shaved in the usual sterile fashion.  Then 1% Xylocaine was used for  local anesthesia.  A  6 French sheath was inserted into the right femoral  artery using standard Seldinger technique.  The 6 French left and right  Judkins diagnostic catheters as well as a 6 French pigtail catheter were  used for selective coronary angiography, left ventriculography, subselective  IMA angiography, selective vein graft angiography, and distal abdominal  aortography.  Omnipaque dye was used for the entirety of the case.  Retrograde aorta, left ventricular and pullback pressures were recorded.   HEMODYNAMICS:  1. Aortic systolic pressure 144, diastolic pressure 61.  2. Left ventricular systolic pressure 145.   End-diastolic pressure of 28.   SELECTIVE CORONARY ANGIOGRAPHY:  1. Left main:  Normal.  2. Left anterior descending:  The LAD was totally occluded at its ostium.  3. Left circumflex:  The left circumflex had a 90% segmental lesion in its     ostium/proximal segment.  The first marginal branch was large and     exhibited competitive flow with retrograde fill of the vein graft.  The     proximal segment of the OM was 80% stenosed.  4. Right coronary artery:  This is a dominant vessel with a 90% ostial     stenosis followed by long 95% stenosis in the mid and distal portion with     competitive flow.  There is a large acute marginal branch noted.  5. Vein graft to the distal right:  There is a patent vein graft to the     circumflex-obtuse marginal branch; widely patent.  6. Vein  graft to the diagonal branch:  Widely patent.  There was     approximately 60% lesion in the diagonal branch just distal to the vein     graft insertion.  The vein graft filled the left anterior descending via     retrograde flow.  7. Left internal mammary artery to the left anterior descending:  Widely     patent.   LEFT VENTRICULOGRAPHY:  RAO left ventriculogram was performed using 20 mL of  Omnipaque dye at 10 mL per second.  The overall LVEF was estimated at  greater than 60% without focal wall motion abnormalities.   DISTAL ABDOMINAL AORTOGRAPHY:  The distal abdominal aortogram was performed  using 20 mL of Omnipaque at 20 mL per second.  The renal arteries were  widely patent.  The infrarenal abdominal aorta and iliac bifurcation  appeared free of significant atherosclerotic changes.   IMPRESSION AND PLAN:  Ms. Graefe has widely patent grafts with normal left  ventricular function.  I believe her chest pain is noncardiac.  IV heparin  was discontinued, and the ACT was measured at greater than 200.  The sheaths  were sewed securely in place. Nitroglycerin was discontinued.  Plans will be  to remove the  sheaths once the ACT falls below 150.  Plans will be to obtain  a gallbladder ultrasound in the morning prior to her discharge.  The patient  left the lab in stable condition.                                               Nanetta Batty, M.D.    Cordelia Pen  D:  05/21/2003  T:  05/22/2003  Job:  [id number]   cc:   [facility] - 2nd floor

## 2010-09-16 ENCOUNTER — Other Ambulatory Visit: Payer: Self-pay | Admitting: Internal Medicine

## 2010-09-16 MED ORDER — MIRTAZAPINE 30 MG PO TABS: 30.0000 mg | ORAL_TABLET | Freq: Every day | ORAL | Status: DC

## 2010-09-16 NOTE — Telephone Encounter (Signed)
Pt needs 30day of mirtazapine 30mg   call into [employer] [phone]

## 2010-09-16 NOTE — Telephone Encounter (Signed)
sent 

## 2010-11-12 ENCOUNTER — Encounter: Payer: Self-pay | Admitting: Internal Medicine

## 2010-11-12 ENCOUNTER — Ambulatory Visit (INDEPENDENT_AMBULATORY_CARE_PROVIDER_SITE_OTHER): Payer: Medicare Other | Admitting: Internal Medicine

## 2010-11-12 DIAGNOSIS — F411 Generalized anxiety disorder: Secondary | ICD-10-CM

## 2010-11-12 DIAGNOSIS — R5383 Other fatigue: Secondary | ICD-10-CM

## 2010-11-12 DIAGNOSIS — E785 Hyperlipidemia, unspecified: Secondary | ICD-10-CM

## 2010-11-12 DIAGNOSIS — E039 Hypothyroidism, unspecified: Secondary | ICD-10-CM

## 2010-11-12 DIAGNOSIS — G8929 Other chronic pain: Secondary | ICD-10-CM

## 2010-11-12 DIAGNOSIS — M549 Dorsalgia, unspecified: Secondary | ICD-10-CM

## 2010-11-12 DIAGNOSIS — R531 Weakness: Secondary | ICD-10-CM

## 2010-11-12 DIAGNOSIS — I10 Essential (primary) hypertension: Secondary | ICD-10-CM

## 2010-11-12 LAB — T4, FREE: Free T4: 1.12 ng/dL (ref 0.60–1.60)

## 2010-11-12 LAB — T3, FREE: T3, Free: 2.4 pg/mL (ref 2.3–4.2)

## 2010-11-12 NOTE — [facility] Notes (Signed)
  Subjective:    Patient ID: Jasmine [name], female    DOB: [birth year], 77 y.o.   MRN: [id number]  HPI patient presents for followup of her hypertension hypothyroidism and hyperlipidemia.  Since her last office visit she has complained of cold or a sense of cold even when the weather has been warm and "stays bubbled out" and sits in her chair most of the day she is inactive but she is mentally alert oriented and shows no other sign of dementia She has some constipation and back pain. The patient states that when she rides in a [place] or sits in a upright position such as sitting at church that her back bothers her.    Review of Systems  Constitutional: Negative for activity change, appetite change and fatigue.  HENT: Negative for ear pain, congestion, neck pain, postnasal drip and sinus pressure.   Eyes: Negative for redness and visual disturbance.  Respiratory: Negative for cough, shortness of breath and wheezing.   Gastrointestinal: Negative for abdominal pain and abdominal distention.  Genitourinary: Negative for dysuria, frequency and menstrual problem.  Musculoskeletal: Negative for myalgias, joint swelling and arthralgias.  Skin: Negative for rash and wound.  Neurological: Negative for dizziness, weakness and headaches.  Hematological: Negative for adenopathy. Does not bruise/bleed easily.  Psychiatric/Behavioral: Negative for sleep disturbance and decreased concentration.       Objective:   Physical Exam  Constitutional: She is oriented to person, place, and time. She appears well-developed and well-nourished. No distress.  HENT:  Head: Normocephalic and atraumatic.  Right Ear: External ear normal.  Left Ear: External ear normal.  Nose: Nose normal.  Mouth/Throat: Oropharynx is clear and moist.  Eyes: Conjunctivae and EOM are normal. Pupils are equal, round, and reactive to light.  Neck: Normal range of motion. Neck supple. No JVD present. No tracheal deviation present. No  thyromegaly present.  Cardiovascular: Normal rate, regular rhythm, normal heart sounds and intact distal pulses.   No murmur heard. Pulmonary/Chest: Effort normal and breath sounds normal. She has no wheezes. She exhibits no tenderness.  Abdominal: Soft. Bowel sounds are normal.  Musculoskeletal: Normal range of motion. She exhibits no edema and no tenderness.  Lymphadenopathy:    She has no cervical adenopathy.  Neurological: She is alert and oriented to person, place, and time. She has normal reflexes. No cranial nerve deficit.  Skin: Skin is warm and dry. She is not diaphoretic.  Psychiatric: She has a normal mood and affect. Her behavior is normal.          Assessment & Plan:  Patient's blood pressure is stable I do believe that we should measure her thyroid doesn't T3 free and a T4 free as well as a TSH because of her temperature intolerance to make sure that her thyroid replacement is inadequate dose. 4 low back pain I am recommending strengthening exercises and I will give her a sheet of these exercises as well as walking on a regular basis

## 2010-11-12 NOTE — Patient Instructions (Signed)
Back Exercises Back exercises help treat and prevent back injuries. The goal of back exercises is to increase the strength of your abdominal and back muscles and the flexibility of your back. These exercises should be started when you no longer have back pain. Back exercises include: 1. Pelvic Tilt - Lie on your back with your knees bent. Tilt your pelvis until the lower part of your back is against the floor. Hold this position 5-10 sec and repeat 5-10 times.  2. Knee to Chest - Pull first one knee up against your chest and hold for 20-30 seconds, repeat this with the other knee, and then both knees. This may be done with the other leg straight or bent, whichever feels better.  3. Sit-Ups or Curl-Ups - Bend your knees 90 degrees. Start with tilting your pelvis, and do a partial, slow sit-up, lifting your trunk only 30-45 degrees off the floor. Take at least 2-3 sec for each sit-up. Do not do sit-ups with your knees out straight. If partial sit-ups are difficult, simply do the above but with only tightening your abdominal muscles and holding it as directed.  4. Hip-Lift - Lie on your back with your knees flexed 90 degrees. Push down with your feet and shoulders as you raise your hips a couple inches off the floor; hold for 10 sec, repeat 5-10 times.  5. Back arches - Lie on your stomach, propping yourself up on bent elbows. Slowly press on your hands, causing an arch in your low back. Repeat 3-5 times. Any initial stiffness and discomfort should lessen with repetition over time.  6. Shoulder-Lifts - Lie face down with arms beside your body. Keep hips and torso pressed to floor as you slowly lift your head and shoulders off the floor.  Do not overdo your exercises, especially in the beginning. Exercises may cause you some mild back discomfort which lasts for a few minutes; however, if the pain is more severe, or lasts for more than 15 minutes, do not continue exercises until you see your caregiver.  Improvement with exercise therapy for back problems is slow.  See your caregivers for assistance with developing a proper back exercise program. Document Released: 05/14/2004 Document Re-Released: 07/03/2008 [employer] Patient Information 2011 [employer]. 

## 2010-11-27 ENCOUNTER — Other Ambulatory Visit: Payer: Self-pay | Admitting: *Deleted

## 2010-11-27 MED ORDER — CLONAZEPAM 1 MG PO TABS: 1.0000 mg | ORAL_TABLET | Freq: Two times a day (BID) | ORAL | Status: DC | PRN

## 2010-12-16 ENCOUNTER — Telehealth: Payer: Self-pay | Admitting: Internal Medicine

## 2010-12-16 NOTE — Telephone Encounter (Signed)
Message on triage line. Would like a Rx for a bladder infection called in for her. She would prefer not to come in for office visit. There was no other info on the message.

## 2010-12-17 NOTE — Telephone Encounter (Signed)
Please advise 

## 2010-12-18 MED ORDER — CIPROFLOXACIN HCL 250 MG PO TABS: 250.0000 mg | ORAL_TABLET | Freq: Two times a day (BID) | ORAL | Status: AC

## 2010-12-18 NOTE — Telephone Encounter (Signed)
Per Dr. Lovell Sheehan Cipro 250 mg. Bid x 3 days.  Notified pt.

## 2010-12-18 NOTE — Telephone Encounter (Signed)
Please ask dr j to address before he leaves

## 2010-12-18 NOTE — Telephone Encounter (Signed)
Pt called back to chek on status of getting rx for bladder inf. Pls call in to [facility]. Pt is req to get a returned phone call.

## 2010-12-29 ENCOUNTER — Other Ambulatory Visit: Payer: Self-pay | Admitting: Internal Medicine

## 2010-12-29 MED ORDER — AMLODIPINE-OLMESARTAN 10-40 MG PO TABS: ORAL_TABLET | ORAL | Status: DC

## 2010-12-29 NOTE — Telephone Encounter (Signed)
Pt req refill of amLODipine-olmesartan (AZOR) 10-40. Pt taking 1/2 tab a day. Need written script to pick up.

## 2010-12-29 NOTE — Telephone Encounter (Signed)
rx up front ready for p/u, pt aware 

## 2011-01-08 HISTORY — PX: TRANSTHORACIC ECHOCARDIOGRAM: SHX275

## 2011-01-22 LAB — URINALYSIS, ROUTINE W REFLEX MICROSCOPIC
Glucose, UA: NEGATIVE mg/dL
Ketones, ur: 15 mg/dL — AB
Nitrite: NEGATIVE
Protein, ur: 30 mg/dL — AB
Specific Gravity, Urine: 1.017 (ref 1.005–1.030)
Urobilinogen, UA: 1 mg/dL (ref 0.0–1.0)
pH: 5.5 (ref 5.0–8.0)

## 2011-01-22 LAB — URINE MICROSCOPIC-ADD ON

## 2011-01-22 LAB — COMPREHENSIVE METABOLIC PANEL
ALT: 16 U/L (ref 0–35)
Alkaline Phosphatase: 68 U/L (ref 39–117)
BUN: 7 mg/dL (ref 6–23)
CO2: 25 mEq/L (ref 19–32)
Chloride: 102 mEq/L (ref 96–112)
Glucose, Bld: 139 mg/dL — ABNORMAL HIGH (ref 70–99)
Potassium: 3.6 mEq/L (ref 3.5–5.1)
Sodium: 136 mEq/L (ref 135–145)
Total Bilirubin: 0.8 mg/dL (ref 0.3–1.2)
Total Protein: 6 g/dL (ref 6.0–8.3)

## 2011-01-22 LAB — CARDIAC PANEL(CRET KIN+CKTOT+MB+TROPI)
CK, MB: 3.6 ng/mL (ref 0.3–4.0)
CK, MB: 4.3 ng/mL — ABNORMAL HIGH (ref 0.3–4.0)
Relative Index: 1.7 (ref 0.0–2.5)
Total CK: 218 U/L — ABNORMAL HIGH (ref 7–177)

## 2011-01-22 LAB — CBC
HCT: 42.3 % (ref 36.0–46.0)
Hemoglobin: 13.5 g/dL (ref 12.0–15.0)
Hemoglobin: 14 g/dL (ref 12.0–15.0)
Hemoglobin: 14.3 g/dL (ref 12.0–15.0)
MCHC: 33.4 g/dL (ref 30.0–36.0)
MCHC: 33.7 g/dL (ref 30.0–36.0)
MCHC: 33.8 g/dL (ref 30.0–36.0)
MCV: 93.7 fL (ref 78.0–100.0)
Platelets: 148 10*3/uL — ABNORMAL LOW (ref 150–400)
Platelets: 170 10*3/uL (ref 150–400)
Platelets: 176 K/uL (ref 150–400)
RBC: 4.51 MIL/uL (ref 3.87–5.11)
RDW: 14.4 % (ref 11.5–15.5)
RDW: 14.6 % (ref 11.5–15.5)
RDW: 14.8 % (ref 11.5–15.5)
WBC: 5.3 K/uL (ref 4.0–10.5)

## 2011-01-22 LAB — OCCULT BLOOD X 1 CARD TO LAB, STOOL: Fecal Occult Bld: NEGATIVE

## 2011-01-22 LAB — DIFFERENTIAL
Basophils Absolute: 0 10*3/uL (ref 0.0–0.1)
Basophils Relative: 0 % (ref 0–1)
Eosinophils Absolute: 0.1 K/uL (ref 0.0–0.7)
Eosinophils Relative: 1 % (ref 0–5)
Lymphocytes Relative: 26 % (ref 12–46)
Lymphs Abs: 1.4 K/uL (ref 0.7–4.0)
Monocytes Absolute: 0.4 10*3/uL (ref 0.1–1.0)
Monocytes Relative: 7 % (ref 3–12)
Neutro Abs: 3.5 10*3/uL (ref 1.7–7.7)
Neutrophils Relative %: 66 % (ref 43–77)

## 2011-01-22 LAB — CK TOTAL AND CKMB (NOT AT ARMC)
CK, MB: 1 ng/mL (ref 0.3–4.0)
CK, MB: 2.1 ng/mL (ref 0.3–4.0)
Relative Index: 1.8 (ref 0.0–2.5)
Relative Index: INVALID (ref 0.0–2.5)
Total CK: 115 U/L (ref 7–177)
Total CK: 58 U/L (ref 7–177)

## 2011-01-22 LAB — PROTIME-INR
INR: 1 (ref 0.00–1.49)
Prothrombin Time: 13.9 seconds (ref 11.6–15.2)

## 2011-01-22 LAB — URINE CULTURE

## 2011-01-22 LAB — POCT I-STAT, CHEM 8
BUN: 8 mg/dL (ref 6–23)
Calcium, Ion: 1.17 mmol/L (ref 1.12–1.32)
Chloride: 103 mEq/L (ref 96–112)
Creatinine, Ser: 1.3 mg/dL — ABNORMAL HIGH (ref 0.4–1.2)
Glucose, Bld: 155 mg/dL — ABNORMAL HIGH (ref 70–99)
HCT: 44 % (ref 36.0–46.0)
Hemoglobin: 15 g/dL (ref 12.0–15.0)
Potassium: 3.7 mEq/L (ref 3.5–5.1)
Sodium: 141 meq/L (ref 135–145)
TCO2: 26 mmol/L (ref 0–100)

## 2011-01-22 LAB — APTT: aPTT: 32 seconds (ref 24–37)

## 2011-01-22 LAB — TROPONIN I
Troponin I: <0.01 ng/mL (ref 0.00–0.06)
Troponin I: <0.01 ng/mL (ref 0.00–0.06)

## 2011-01-22 LAB — POCT CARDIAC MARKERS
CKMB, poc: <1 ng/mL — ABNORMAL LOW (ref 1.0–8.0)
Myoglobin, poc: 133 ng/mL (ref 12–200)
Troponin i, poc: <0.05 ng/mL (ref 0.00–0.09)

## 2011-01-22 LAB — B-NATRIURETIC PEPTIDE (CONVERTED LAB)
Pro B Natriuretic peptide (BNP): 148 pg/mL — ABNORMAL HIGH (ref 0.0–100.0)
Pro B Natriuretic peptide (BNP): 62 pg/mL (ref 0.0–100.0)

## 2011-01-22 LAB — D-DIMER, QUANTITATIVE: D-Dimer, Quant: 0.76 ug/mL-FEU — ABNORMAL HIGH (ref 0.00–0.48)

## 2011-02-13 ENCOUNTER — Ambulatory Visit (INDEPENDENT_AMBULATORY_CARE_PROVIDER_SITE_OTHER): Payer: Medicare Other | Admitting: Internal Medicine

## 2011-02-13 ENCOUNTER — Encounter: Payer: Self-pay | Admitting: Internal Medicine

## 2011-02-13 VITALS — BP 160/80 | HR 76 | Temp 98.6°F | Resp 16 | Ht 64.0 in | Wt 154.0 lb

## 2011-02-13 DIAGNOSIS — Z23 Encounter for immunization: Secondary | ICD-10-CM

## 2011-02-13 DIAGNOSIS — I10 Essential (primary) hypertension: Secondary | ICD-10-CM

## 2011-02-13 MED ORDER — LOSARTAN POTASSIUM 100 MG PO TABS: 100.0000 mg | ORAL_TABLET | Freq: Every day | ORAL | Status: DC

## 2011-02-13 NOTE — Patient Instructions (Signed)
Please take the Cozaar 50 mg 2 daily for a total dose of 100 mg of Cozaar until you see Dr. Allyson Sabal.

## 2011-02-13 NOTE — [facility] Notes (Signed)
Subjective:    Patient ID: Jasmine [name], female    DOB: 1922-11-14, 77 y.o.   MRN: [id number]  HPI patient has a history of bypass surgery.  She presented her cardiologist with elevated heart rate atrial fibrillation was diagnosed the combination amlodipine and Avapro was stopped and a combination of Cardizem and a similar ARB-type drug Cozaar was then instituted for control of blood pressure as well as anticoagulation with new oral class   She presents today tolerating anticoagulant with elevated blood pressure pulse appears to be steady Rare chest pain. No abnormal shortness of breath but has developed a cough. Vessels noticed increased frequency of urination. Moderate swelling in her feet.    Review of Systems  Respiratory: Positive for shortness of breath.   Cardiovascular: Negative for chest pain.  Gastrointestinal: Negative for abdominal distention.   Past Medical History  Diagnosis Date  . Insomnia due to medical condition   . Anxiety   . Hyperlipidemia   . Hypertension   . Hypothyroidism   . Low back pain   . Left wrist fracture 09/2007  . Fracture of right shoulder 02/2008  . GERD (gastroesophageal reflux disease)   . Barrett's esophagus   . Hemorrhoids, external   . CAD (coronary artery disease) of artery bypass graft 1995  . Hiatal hernia   . History of colon polyps     History   Social History  . Marital Status: Widowed    Spouse Name: N/A    Number of Children: N/A  . Years of Education: N/A   Occupational History  . Not on file.   Social History Main Topics  . Smoking status: Former Smoker    Quit date: 04/20/1968  . Smokeless tobacco: Never Used   Comment: QUIT IN 1970  . Alcohol Use: No     STOPPED IN 1970  . Drug Use: No  . Sexually Active: Not Currently   Other Topics Concern  . Not on file   Social History Narrative  . No narrative on file    Past Surgical History  Procedure Date  . Colonoscopy w/ polypectomy 06/2005  . Carotid  endarterectomy   . Cataract extraction     Family History  Problem Relation Age of Onset  . Diabetes Son   . Heart disease Mother   . Heart disease Father     Allergies  Allergen Reactions  . Codeine   . Meperidine Hcl   . Oxycodone-Acetaminophen     Current Outpatient Prescriptions on File Prior to Visit  Medication Sig Dispense Refill  . diltiazem (CARDIZEM CD) 120 MG 24 hr capsule Take 120 mg by mouth daily.        Marland Kitchen aspirin 81 MG tablet Take 81 mg by mouth daily.        Marland Kitchen atorvastatin (LIPITOR) 80 MG tablet Take 1 tablet (80 mg total) by mouth daily.  90 tablet  3  . bisoprolol (ZEBETA) 5 MG tablet Take 1.5 tablets (7.5 mg total) by mouth daily.  140 tablet  3  . Calcium Carbonate-Vitamin D (CALCIUM 600-D) 600-400 MG-UNIT per tablet Take 1 tablet by mouth 2 (two) times daily.        . clonazePAM (KLONOPIN) 1 MG tablet Take a half tablet when you awaken in the morning take a half tablet at 6:00 PM and take a whole tablet right before that  90 tablet  3  . cyanocobalamin 500 MCG tablet Take 500 mcg by mouth daily.        Marland Kitchen  levothyroxine (SYNTHROID, LEVOTHROID) 88 MCG tablet Take 1 tablet (88 mcg total) by mouth daily.  90 tablet  3  . losartan (COZAAR) 100 MG tablet Take 1 tablet (100 mg total) by mouth daily.  90 tablet  3  . mirtazapine (REMERON) 45 MG tablet Take 1 tablet (45 mg total) by mouth at bedtime.  90 tablet  3  . Multiple Vitamin (MULTIVITAMIN) capsule Take 1 capsule by mouth daily.        Marland Kitchen omeprazole (PRILOSEC) 20 MG capsule Take 1 capsule (20 mg total) by mouth daily.  90 capsule  3  . QUEtiapine (SEROQUEL) 50 MG tablet Take 1 tablet (50 mg total) by mouth at bedtime.  90 tablet  3  . Rivaroxaban (XARELTO) 20 MG TABS Take 1 tablet (20 mg total) by mouth daily.  90 tablet  3  . vitamin E 400 UNIT capsule Take 400 Units by mouth daily.          BP 160/80  Pulse 76  Temp 98.6 F (37 C)  Resp 16  Ht 5\' 4"  (1.626 m)  Wt 154 lb (69.854 kg)  BMI 26.43  kg/m2       Objective:   Physical Exam  Nursing note reviewed. Cardiovascular: Regular rhythm.   Murmur heard. Pulmonary/Chest: Effort normal and breath sounds normal.  Abdominal: Soft. Bowel sounds are normal.    monitoring of blood pressures      Assessment & Plan:  She is on a combination of Cardizem and Cozaar.  Her heart rate is well controlled with Cardizem and I do not want to make her bradycardic however blood pressures not at goal therefore we will titrate Cozaar from 50-100.  I asked her to double the Cozaar she has currently until she sees her cardiologist in 2 weeks at that time he may prescribe the 100 mg Cozaar tablets. I agree with the use of xaralto

## 2011-02-20 ENCOUNTER — Telehealth: Payer: Self-pay | Admitting: *Deleted

## 2011-02-20 MED ORDER — MUPIROCIN 2 % EX OINT: TOPICAL_OINTMENT | CUTANEOUS | Status: DC

## 2011-02-20 NOTE — Telephone Encounter (Signed)
Addition to first note today- per dr Lovell Sheehan also call in mupirocin   i

## 2011-02-20 NOTE — Telephone Encounter (Signed)
Had itched and pt made it have whelpswhen itching-- instructed to use a cool wet compress and benadryl50 tid for 2 days=-call back on Monday if not better

## 2011-02-20 NOTE — Telephone Encounter (Signed)
Raised red papules at site of flu vaccine, and wants Dr. Lovell Sheehan to see her today.

## 2011-03-04 ENCOUNTER — Telehealth: Payer: Self-pay | Admitting: *Deleted

## 2011-03-04 NOTE — Telephone Encounter (Signed)
Productive cough with brown mucus.  No fever.

## 2011-03-04 NOTE — Telephone Encounter (Signed)
**  voicemail**  Pt has been fighting a cold for the past five days, requesting to speak with triage.

## 2011-03-05 MED ORDER — AZITHROMYCIN 250 MG PO TABS: ORAL_TABLET | ORAL | Status: AC

## 2011-03-05 NOTE — Telephone Encounter (Signed)
Per dr jenkins-may have z pack 

## 2011-03-05 NOTE — Telephone Encounter (Signed)
Notified pt. 

## 2011-03-26 ENCOUNTER — Other Ambulatory Visit: Payer: Self-pay

## 2011-03-26 ENCOUNTER — Encounter (HOSPITAL_COMMUNITY): Payer: Self-pay | Admitting: Emergency Medicine

## 2011-03-26 ENCOUNTER — Emergency Department (HOSPITAL_COMMUNITY): Payer: Medicare Other

## 2011-03-26 ENCOUNTER — Emergency Department (HOSPITAL_COMMUNITY)
Admission: EM | Admit: 2011-03-26 | Discharge: 2011-03-26 | Disposition: A | Discharge status: Home / Self Care | Payer: Medicare Other | Attending: Cardiology | Admitting: Cardiology

## 2011-03-26 DIAGNOSIS — K219 Gastro-esophageal reflux disease without esophagitis: Secondary | ICD-10-CM | POA: Insufficient documentation

## 2011-03-26 DIAGNOSIS — E039 Hypothyroidism, unspecified: Secondary | ICD-10-CM | POA: Insufficient documentation

## 2011-03-26 DIAGNOSIS — Z951 Presence of aortocoronary bypass graft: Secondary | ICD-10-CM | POA: Insufficient documentation

## 2011-03-26 DIAGNOSIS — Z9889 Other specified postprocedural states: Secondary | ICD-10-CM | POA: Insufficient documentation

## 2011-03-26 DIAGNOSIS — F411 Generalized anxiety disorder: Secondary | ICD-10-CM | POA: Insufficient documentation

## 2011-03-26 DIAGNOSIS — I2510 Atherosclerotic heart disease of native coronary artery without angina pectoris: Secondary | ICD-10-CM | POA: Insufficient documentation

## 2011-03-26 DIAGNOSIS — R079 Chest pain, unspecified: Secondary | ICD-10-CM | POA: Insufficient documentation

## 2011-03-26 DIAGNOSIS — E785 Hyperlipidemia, unspecified: Secondary | ICD-10-CM | POA: Insufficient documentation

## 2011-03-26 DIAGNOSIS — Z79899 Other long term (current) drug therapy: Secondary | ICD-10-CM | POA: Insufficient documentation

## 2011-03-26 DIAGNOSIS — Z7982 Long term (current) use of aspirin: Secondary | ICD-10-CM | POA: Insufficient documentation

## 2011-03-26 LAB — URINALYSIS, ROUTINE W REFLEX MICROSCOPIC
Bilirubin Urine: NEGATIVE
Specific Gravity, Urine: 1.007 (ref 1.005–1.030)
Urobilinogen, UA: 1 mg/dL (ref 0.0–1.0)

## 2011-03-26 LAB — CARDIAC PANEL(CRET KIN+CKTOT+MB+TROPI)
CK, MB: 2.3 ng/mL (ref 0.3–4.0)
Relative Index: INVALID (ref 0.0–2.5)
Total CK: 62 U/L (ref 7–177)
Troponin I: <0.3 ng/mL (ref <0.30)
Troponin I: <0.3 ng/mL (ref <0.30)

## 2011-03-26 LAB — COMPREHENSIVE METABOLIC PANEL
ALT: 17 U/L (ref 0–35)
AST: 28 U/L (ref 0–37)
CO2: 31 mEq/L (ref 19–32)
Chloride: 104 mEq/L (ref 96–112)
GFR calc non Af Amer: 49 mL/min — ABNORMAL LOW (ref >90)
Sodium: 142 mEq/L (ref 135–145)
Total Bilirubin: 0.4 mg/dL (ref 0.3–1.2)

## 2011-03-26 LAB — DIFFERENTIAL
Basophils Absolute: 0 10*3/uL (ref 0.0–0.1)
Lymphocytes Relative: 36 % (ref 12–46)
Neutro Abs: 2 10*3/uL (ref 1.7–7.7)
Neutrophils Relative %: 52 % (ref 43–77)

## 2011-03-26 LAB — CBC
Platelets: 107 10*3/uL — ABNORMAL LOW (ref 150–400)
RDW: 13.6 % (ref 11.5–15.5)
WBC: 3.8 10*3/uL — ABNORMAL LOW (ref 4.0–10.5)

## 2011-03-26 LAB — URINE MICROSCOPIC-ADD ON

## 2011-03-26 NOTE — ED Notes (Signed)
Daughter has returned, pt states will let me know when she feels like she can urinate.

## 2011-03-26 NOTE — H&P (Signed)
Jasmine [name] is an 77 y.o. female.   Chief Complaint: chest pain HPI: Patient is an 77 year old Caucasian female with history of coronary artery disease status post coronary artery bypass grafting in 1997 as was carotid endarterectomy at that time. Last cardiac catheterization was 12/09/2003 revealing patent grafts and normal LV function. She had a Myoview stress test in January 2012 which was normal. Chest has a history of peripheral vascular obstructive disease hypertension hyperlipidemia. This past October she developed atrial fibrillation and was subsequently started on Zaroxolyn to her she was back in sinus rhythm on subsequent office visit November 12 is currently in sinus rhythm.  Patient presents today with chest pain which he describes as a tight pressure 7/10 in intensity substernal and under her left breast. It started approximately 0630 hrs. This morning would come and go she hadn't taken any of her medicines. Upon presenting to [facility] ER the chest pain didn't completely resolve.  Patient reports recent chest cold and finished a round of antibiotics approximately 5 days ago.  The troponin and CK-MB are negative.  Past Medical History  Diagnosis Date  . Insomnia due to medical condition   . Anxiety   . Hyperlipidemia   . Hypertension   . Hypothyroidism   . Low back pain   . Left wrist fracture 09/2007  . Fracture of right shoulder 02/2008  . GERD (gastroesophageal reflux disease)   . Barrett's esophagus   . Hemorrhoids, external   . CAD (coronary artery disease) of artery bypass graft 1995  . Hiatal hernia   . History of colon polyps     Past Surgical History  Procedure Date  . Colonoscopy w/ polypectomy 06/2005  . Carotid endarterectomy   . Cataract extraction     Family History  Problem Relation Age of Onset  . Diabetes Son   . Heart disease Mother   . Heart disease Father    Social History:  reports that she quit smoking about 42 years ago. She has never used  smokeless tobacco. She reports that she does not drink alcohol or use illicit drugs.  Allergies:  Allergies  Allergen Reactions  . Codeine   . Meperidine Hcl   . Oxycodone-Acetaminophen     No current facility-administered medications on file as of 03/26/2011.   Medications Prior to Admission  Medication Sig Dispense Refill  . aspirin 81 MG tablet Take 81 mg by mouth daily.        Marland Kitchen atorvastatin (LIPITOR) 80 MG tablet Take 80 mg by mouth daily.        . bisoprolol (ZEBETA) 5 MG tablet Take 7.5 mg by mouth daily.       . Calcium Carbonate-Vitamin D (CALCIUM 600-D) 600-400 MG-UNIT per tablet Take 1 tablet by mouth 2 (two) times daily.        . clonazePAM (KLONOPIN) 1 MG tablet Take 0.5-1 mg by mouth 3 (three) times daily as needed. 1 tablet twice a day if needed and 1/2 tablet at bedtime       . cyanocobalamin 500 MCG tablet Take 500 mcg by mouth daily.        Marland Kitchen diltiazem (CARDIZEM CD) 120 MG 24 hr capsule Take 120 mg by mouth daily.        Marland Kitchen levothyroxine (SYNTHROID, LEVOTHROID) 88 MCG tablet Take 1 tablet (88 mcg total) by mouth daily.  90 tablet  3  . losartan (COZAAR) 100 MG tablet Take 1 tablet (100 mg total) by mouth daily.  30  tablet  3  . mirtazapine (REMERON) 30 MG tablet Take 1 tablet (30 mg total) by mouth at bedtime.  30 tablet  1  . Multiple Vitamin (MULTIVITAMIN) capsule Take 1 capsule by mouth daily.        Marland Kitchen omeprazole (PRILOSEC) 20 MG capsule Take 1 capsule (20 mg total) by mouth daily.  90 capsule  3  . QUEtiapine (SEROQUEL) 50 MG tablet Take 1 tablet (50 mg total) by mouth at bedtime.  90 tablet  3  . Rivaroxaban (XARELTO) 20 MG TABS Take by mouth.        . vitamin E 400 UNIT capsule Take 400 Units by mouth daily.          Results for orders placed during the hospital encounter of 03/26/11 (from the past 48 hour(s))  CBC     Status: Abnormal   Collection Time   03/26/11  1:14 PM      Component Value Range Comment   WBC 3.8 (*) 4.0 - 10.5 (K/uL)    RBC 4.76  3.87 -  5.11 (MIL/uL)    Hemoglobin 14.2  12.0 - 15.0 (g/dL)    HCT 91.4  78.2 - 95.6 (%)    MCV 91.4  78.0 - 100.0 (fL)    MCH 29.8  26.0 - 34.0 (pg)    MCHC 32.6  30.0 - 36.0 (g/dL)    RDW 21.3  08.6 - 57.8 (%)    Platelets 107 (*) 150 - 400 (K/uL) PLATELET COUNT CONFIRMED BY SMEAR  DIFFERENTIAL     Status: Normal   Collection Time   03/26/11  1:14 PM      Component Value Range Comment   Neutrophils Relative 52  43 - 77 (%)    Neutro Abs 2.0  1.7 - 7.7 (K/uL)    Lymphocytes Relative 36  12 - 46 (%)    Lymphs Abs 1.4  0.7 - 4.0 (K/uL)    Monocytes Relative 9  3 - 12 (%)    Monocytes Absolute 0.4  0.1 - 1.0 (K/uL)    Eosinophils Relative 3  0 - 5 (%)    Eosinophils Absolute 0.1  0.0 - 0.7 (K/uL)    Basophils Relative 1  0 - 1 (%)    Basophils Absolute 0.0  0.0 - 0.1 (K/uL)   COMPREHENSIVE METABOLIC PANEL     Status: Abnormal   Collection Time   03/26/11  1:14 PM      Component Value Range Comment   Sodium 142  135 - 145 (mEq/L)    Potassium 4.1  3.5 - 5.1 (mEq/L)    Chloride 104  96 - 112 (mEq/L)    CO2 31  19 - 32 (mEq/L)    Glucose, Bld 100 (*) 70 - 99 (mg/dL)    BUN 10  6 - 23 (mg/dL)    Creatinine, Ser 4.69  0.50 - 1.10 (mg/dL)    Calcium 9.0  8.4 - 10.5 (mg/dL)    Total Protein 6.3  6.0 - 8.3 (g/dL)    Albumin 3.3 (*) 3.5 - 5.2 (g/dL)    AST 28  0 - 37 (U/L)    ALT 17  0 - 35 (U/L)    Alkaline Phosphatase 99  39 - 117 (U/L)    Total Bilirubin 0.4  0.3 - 1.2 (mg/dL)    GFR calc non Af Amer 49 (*) >90 (mL/min)    GFR calc Af Amer 57 (*) >90 (mL/min)   CARDIAC PANEL(CRET KIN+CKTOT+MB+TROPI)  Status: Normal   Collection Time   03/26/11  1:20 PM      Component Value Range Comment   Total CK 59  7 - 177 (U/L)    CK, MB 2.3  0.3 - 4.0 (ng/mL)    Troponin I <0.30  <0.30 (ng/mL)    Relative Index RELATIVE INDEX IS INVALID  0.0 - 2.5    URINALYSIS, ROUTINE W REFLEX MICROSCOPIC     Status: Abnormal   Collection Time   03/26/11  1:33 PM      Component Value Range Comment    Color, Urine YELLOW  YELLOW     APPearance CLEAR  CLEAR     Specific Gravity, Urine 1.007  1.005 - 1.030     pH 7.0  5.0 - 8.0     Glucose, UA NEGATIVE  NEGATIVE (mg/dL)    Hgb urine dipstick TRACE (*) NEGATIVE     Bilirubin Urine NEGATIVE  NEGATIVE     Ketones, ur NEGATIVE  NEGATIVE (mg/dL)    Protein, ur NEGATIVE  NEGATIVE (mg/dL)    Urobilinogen, UA 1.0  0.0 - 1.0 (mg/dL)    Nitrite NEGATIVE  NEGATIVE     Leukocytes, UA SMALL (*) NEGATIVE    URINE MICROSCOPIC-ADD ON     Status: Normal   Collection Time   03/26/11  1:33 PM      Component Value Range Comment   WBC, UA 3-6  <3 (WBC/hpf)    RBC / HPF 0-2  <3 (RBC/hpf)    Bacteria, UA RARE  RARE     Dg Chest Port 1 View  03/26/2011  *RADIOLOGY REPORT*  Clinical Data: Chest pain, history CABG  PORTABLE CHEST - 1 VIEW  Comparison: Chest x-ray of 05/24/2010  Findings: The lungs are clear.  The heart is mildly enlarged and stable.  Median sternotomy sutures are noted.  No acute bony abnormality is seen.  IMPRESSION: No active lung disease.  Borderline cardiomegaly.  Original Report Authenticated By: Juline Patch, M.D.    Review of Systems  Constitutional: Negative for diaphoresis.  HENT: Negative for congestion.   Eyes: Negative for blurred vision.  Respiratory: Negative for shortness of breath and wheezing.   Cardiovascular: Positive for chest pain. Negative for palpitations, orthopnea, leg swelling and PND.       "tightness/pressure" 7/10. SS and under left breast.  Gastrointestinal: Negative for nausea, vomiting, abdominal pain and diarrhea.  Neurological: Negative for dizziness and weakness.  All other systems reviewed and are negative.    Blood pressure 142/68, pulse 58, temperature 97.6 F (36.4 C), temperature source Oral, resp. rate 13, height 5\' 5"  (1.651 m), weight 68.04 kg (150 lb), SpO2 98.00%. Physical Exam  Constitutional: She is oriented to person, place, and time. No distress.  HENT:  Head: Normocephalic and  atraumatic.  Eyes: EOM are normal. Pupils are equal, round, and reactive to light. No scleral icterus.  Neck: No JVD present.  Cardiovascular: Normal rate and regular rhythm.   No murmur heard. Respiratory: Effort normal and breath sounds normal. She has no wheezes.       Mild crackles right base   GI: Soft. Bowel sounds are normal. There is no tenderness.  Musculoskeletal: She exhibits no edema.  Lymphadenopathy:    She has no cervical adenopathy.  Neurological: She is alert and oriented to person, place, and time.       Strength 4/5 equal bilateral upper and lower extremities.  Skin: She is not diaphoretic.  Multiple Seborrheic keratoses on the patient's back     Assessment/Plan 1 chest pain 2 coronary artery disease status post coronary artery bypass grafting 1997 3 hyperlipidemia 4 hypertension 5 paroxysmal atrial fibrillation  Plan:  His chest pain is likely noncardiac in nature and resolved prior to arrival at the ER.   Chest x-ray is negative for acute process.  She had a Myoview stress test in January of this year showed normal LV function and was negative for ischemia.  Cardiac enzymes have been negative. She is due for a 2-D echocardiogram next month at the [facility].  Patient will likely be discharged home and will keep appointment for 2-D echocardiogram next month.  Dwana Melena 03/26/2011, 3:14 PM  This 77 year old female had a history of bypass surgery approximately 15 years ago. She had a nuclear study done within the last 12 months it was normal with him no ischemia and a normal EF. She awoke about 6:30 this morning the bathroom and after she laid back down she had some bubbly sensation underneath her left breast this is different from what she had felt in the past it concerned her and she came to the emergency room for evaluation her cardiac markers are those far negative. He's had no reoccurrence of any discomfort and the pain  actually went away prior to her arrival in the emergency room.  Her lungs were clear with no wheezes rales Cardiac exam reveals a regular heart rhythm I did not appreciate a murmur Normal bowel sounds no epigastric tenderness liver is not palpable. No tenderness [place] resides good pulses no carotid bruits and no obvious right carotid endarterectomy scar although she had had has had this done.  Assessment #1 atypical chest discomfort probably GI in nature nothing at this point to suggest cardiac if her second set of cardiac markers are unremarkable I see no reason she will need to be admitted. He can followup with Dr. Allyson Sabal as an outpatient. Most likely explanation for her symptoms is GI and in retrospect she did eat 3 alarm chili last night. EKG shows a leftward axis and loss of R. forces in the 3

## 2011-03-26 NOTE — ED Provider Notes (Cosign Needed Addendum)
History     CSN: [id number] Arrival date & time: 03/26/2011 11:58 AM   None     Chief Complaint  Patient presents with  . Chest Pain    (Consider location/radiation/quality/duration/timing/severity/associated sxs/prior treatment) Patient is a 77 y.o. female presenting with chest pain. The history is provided by the patient and medical records. No language interpreter was used.  Chest Pain The chest pain began 3 - 5 hours ago. Episode Length: She woke up at 6:30 A.M. with substernal chest pain, liike a bubble, that radiated to under the left breast. Chest pain occurs intermittently. The chest pain is unchanged. At its most intense, the pain is at 7/10. The pain is currently at 7/10. The severity of the pain is moderate. Quality: Like a bubble. Radiates to: It radiates to the left chest under her breast. Pertinent negatives for primary symptoms include no fever, no syncope, no shortness of breath, no cough, no abdominal pain, no nausea, no vomiting and no dizziness. She tried nothing for the symptoms. Risk factors include being elderly, sedentary lifestyle and post-menopausal (Prior CABG in 1997.  Followed by Dr. Allyson Sabal at [facility].).  Her past medical history is significant for CAD. Procedure history comments: Had myoview earlier this year that was negative..     Past Medical History  Diagnosis Date  . Insomnia due to medical condition   . Anxiety   . Hyperlipidemia   . Hypertension   . Hypothyroidism   . Low back pain   . Left wrist fracture 09/2007  . Fracture of right shoulder 02/2008  . GERD (gastroesophageal reflux disease)   . Barrett's esophagus   . Hemorrhoids, external   . CAD (coronary artery disease) of artery bypass graft 1995  . Hiatal hernia   . History of colon polyps     Past Surgical History  Procedure Date  . Colonoscopy w/ polypectomy 06/2005  . Carotid endarterectomy   . Cataract extraction     Family History  Problem Relation Age  of Onset  . Diabetes Son   . Heart disease Mother   . Heart disease Father     History  Substance Use Topics  . Smoking status: Former Smoker    Quit date: 04/20/1968  . Smokeless tobacco: Never Used   Comment: QUIT IN 1970  . Alcohol Use: No     STOPPED IN 1970    OB History    Grav Para Term Preterm Abortions TAB SAB Ect Mult Living                  Review of Systems  Constitutional: Negative.  Negative for fever and chills.  HENT: Negative.   Eyes: Negative.   Respiratory: Negative for cough and shortness of breath.   Cardiovascular: Positive for chest pain. Negative for leg swelling and syncope.  Gastrointestinal: Negative for nausea, vomiting and abdominal pain.  Genitourinary: Negative.   Skin: Negative.   Neurological: Negative.  Negative for dizziness.  Psychiatric/Behavioral: Negative.     Allergies  Codeine; Meperidine hcl; and Oxycodone-acetaminophen  Home Medications   Current Outpatient Rx  Name Route Sig Dispense Refill  . ASPIRIN 81 MG PO TABS Oral Take 81 mg by mouth daily.      . ATORVASTATIN CALCIUM 80 MG PO TABS Oral Take 80 mg by mouth daily.      Marland Kitchen BISOPROLOL FUMARATE 5 MG PO TABS Oral Take 7.5 mg by mouth daily.     Marland Kitchen CALCIUM CARBONATE-VITAMIN D 600-400 MG-UNIT  PO TABS Oral Take 1 tablet by mouth 2 (two) times daily.      Marland Kitchen CLONAZEPAM 1 MG PO TABS Oral Take 0.5-1 mg by mouth 3 (three) times daily as needed. 1 tablet twice a day if needed and 1/2 tablet at bedtime     . CYANOCOBALAMIN 500 MCG PO TABS Oral Take 500 mcg by mouth daily.      Marland Kitchen DILTIAZEM HCL ER COATED BEADS 120 MG PO CP24 Oral Take 120 mg by mouth daily.      Marland Kitchen LEVOTHYROXINE SODIUM 88 MCG PO TABS Oral Take 1 tablet (88 mcg total) by mouth daily. 90 tablet 3  . LOSARTAN POTASSIUM 100 MG PO TABS Oral Take 1 tablet (100 mg total) by mouth daily. 30 tablet 3  . MIRTAZAPINE 30 MG PO TABS Oral Take 1 tablet (30 mg total) by mouth at bedtime. 30 tablet 1  . MULTIVITAMINS PO CAPS Oral Take  1 capsule by mouth daily.      Marland Kitchen OMEPRAZOLE 20 MG PO CPDR Oral Take 1 capsule (20 mg total) by mouth daily. 90 capsule 3  . QUETIAPINE FUMARATE 50 MG PO TABS Oral Take 1 tablet (50 mg total) by mouth at bedtime. 90 tablet 3  . RIVAROXABAN 20 MG PO TABS Oral Take by mouth.      Marland Kitchen VITAMIN E 400 UNITS PO CAPS Oral Take 400 Units by mouth daily.        BP 142/68  Pulse 58  Temp(Src) 97.6 F (36.4 C) (Oral)  Resp 13  Ht 5\' 5"  (1.651 m)  Wt 150 lb (68.04 kg)  BMI 24.96 kg/m2  SpO2 98%  Physical Exam  Nursing note and vitals reviewed. Constitutional: She is oriented to person, place, and time.       Pleasant elderly woman, mild distress, concerned about chest pain.  HENT:  Head: Normocephalic and atraumatic.  Right Ear: External ear normal.  Left Ear: External ear normal.  Mouth/Throat: Oropharynx is clear and moist.  Eyes: Conjunctivae and EOM are normal. Pupils are equal, round, and reactive to light.  Neck: Normal range of motion. Neck supple.  Cardiovascular: Normal rate, regular rhythm and normal heart sounds.   Pulmonary/Chest: Effort normal and breath sounds normal. She exhibits no tenderness.  Abdominal: Soft. Bowel sounds are normal.  Musculoskeletal: Normal range of motion. She exhibits no edema and no tenderness.  Neurological: She is alert and oriented to person, place, and time.       No sensory or motor deficit.  Skin: Skin is warm and dry.  Psychiatric: She has a normal mood and affect. Her behavior is normal.    ED Course  Procedures (including critical care time)  3:39 PM  Date: 03/26/2011  Rate: 61  Rhythm: normal sinus rhythm and sinus arrhythmia  QRS Axis: left  Intervals: normal QRS:  Low voltage QRS in limb leads.  Poor R wave progression in precordial leads suggests possible old anterior myocardial infarction.  ST/T Wave abnormalities: normal  Conduction Disutrbances:none  Narrative Interpretation: Abnormal EKG  Old EKG Reviewed: none  available  Results for orders placed during the hospital encounter of 03/26/11  CBC      Component Value Range   WBC 3.8 (*) 4.0 - 10.5 (K/uL)   RBC 4.76  3.87 - 5.11 (MIL/uL)   Hemoglobin 14.2  12.0 - 15.0 (g/dL)   HCT 91.4  78.2 - 95.6 (%)   MCV 91.4  78.0 - 100.0 (fL)   MCH 29.8  26.0 -  34.0 (pg)   MCHC 32.6  30.0 - 36.0 (g/dL)   RDW 16.1  09.6 - 04.5 (%)   Platelets 107 (*) 150 - 400 (K/uL)  DIFFERENTIAL      Component Value Range   Neutrophils Relative 52  43 - 77 (%)   Neutro Abs 2.0  1.7 - 7.7 (K/uL)   Lymphocytes Relative 36  12 - 46 (%)   Lymphs Abs 1.4  0.7 - 4.0 (K/uL)   Monocytes Relative 9  3 - 12 (%)   Monocytes Absolute 0.4  0.1 - 1.0 (K/uL)   Eosinophils Relative 3  0 - 5 (%)   Eosinophils Absolute 0.1  0.0 - 0.7 (K/uL)   Basophils Relative 1  0 - 1 (%)   Basophils Absolute 0.0  0.0 - 0.1 (K/uL)  COMPREHENSIVE METABOLIC PANEL      Component Value Range   Sodium 142  135 - 145 (mEq/L)   Potassium 4.1  3.5 - 5.1 (mEq/L)   Chloride 104  96 - 112 (mEq/L)   CO2 31  19 - 32 (mEq/L)   Glucose, Bld 100 (*) 70 - 99 (mg/dL)   BUN 10  6 - 23 (mg/dL)   Creatinine, Ser 4.09  0.50 - 1.10 (mg/dL)   Calcium 9.0  8.4 - 81.1 (mg/dL)   Total Protein 6.3  6.0 - 8.3 (g/dL)   Albumin 3.3 (*) 3.5 - 5.2 (g/dL)   AST 28  0 - 37 (U/L)   ALT 17  0 - 35 (U/L)   Alkaline Phosphatase 99  39 - 117 (U/L)   Total Bilirubin 0.4  0.3 - 1.2 (mg/dL)   GFR calc non Af Amer 49 (*) >90 (mL/min)   GFR calc Af Amer 57 (*) >90 (mL/min)  URINALYSIS, ROUTINE W REFLEX MICROSCOPIC      Component Value Range   Color, Urine YELLOW  YELLOW    APPearance CLEAR  CLEAR    Specific Gravity, Urine 1.007  1.005 - 1.030    pH 7.0  5.0 - 8.0    Glucose, UA NEGATIVE  NEGATIVE (mg/dL)   Hgb urine dipstick TRACE (*) NEGATIVE    Bilirubin Urine NEGATIVE  NEGATIVE    Ketones, ur NEGATIVE  NEGATIVE (mg/dL)   Protein, ur NEGATIVE  NEGATIVE (mg/dL)   Urobilinogen, UA 1.0  0.0 - 1.0 (mg/dL)   Nitrite NEGATIVE   NEGATIVE    Leukocytes, UA SMALL (*) NEGATIVE   CARDIAC PANEL(CRET KIN+CKTOT+MB+TROPI)      Component Value Range   Total CK 59  7 - 177 (U/L)   CK, MB 2.3  0.3 - 4.0 (ng/mL)   Troponin I <0.30  <0.30 (ng/mL)   Relative Index RELATIVE INDEX IS INVALID  0.0 - 2.5   URINE MICROSCOPIC-ADD ON      Component Value Range   WBC, UA 3-6  <3 (WBC/hpf)   RBC / HPF 0-2  <3 (RBC/hpf)   Bacteria, UA RARE  RARE    Dg Chest Port 1 View  03/26/2011  *RADIOLOGY REPORT*  Clinical Data: Chest pain, history CABG  PORTABLE CHEST - 1 VIEW  Comparison: Chest x-ray of 05/24/2010  Findings: The lungs are clear.  The heart is mildly enlarged and stable.  Median sternotomy sutures are noted.  No acute bony abnormality is seen.  IMPRESSION: No active lung disease.  Borderline cardiomegaly.  Original Report Authenticated By: Juline Patch, M.D.      3:39 PM Lab workup is negative.  Call to [facility]; they have seen her.  If her second troponin is negative they will release her.   1. Chest pain     4:18 PM Second set of cardiac markers is negative.  Released, with followup with Dr. Nanetta Batty, her cardiologist.         Carleene Cooper III, MD 03/26/11 1619  Carleene Cooper III, MD 07/01/11 [phone]

## 2011-03-26 NOTE — ED Notes (Signed)
Pt c/o left sided CP with radiation under left breast starting this am; pt denies SOB, N/V or diaphoresis; pt with hx of CABG

## 2011-03-27 LAB — URINE CULTURE
Colony Count: NO GROWTH
Culture: NO GROWTH

## 2011-04-03 HISTORY — PX: OTHER SURGICAL HISTORY: SHX169

## 2011-04-13 ENCOUNTER — Encounter: Payer: Self-pay | Admitting: Internal Medicine

## 2011-04-24 ENCOUNTER — Ambulatory Visit (INDEPENDENT_AMBULATORY_CARE_PROVIDER_SITE_OTHER): Payer: Medicare Other | Admitting: Internal Medicine

## 2011-04-24 ENCOUNTER — Encounter: Payer: Self-pay | Admitting: Internal Medicine

## 2011-04-24 VITALS — BP 150/80 | HR 72 | Temp 98.2°F | Resp 16 | Ht 64.0 in | Wt 149.0 lb

## 2011-04-24 DIAGNOSIS — T887XXA Unspecified adverse effect of drug or medicament, initial encounter: Secondary | ICD-10-CM

## 2011-04-24 DIAGNOSIS — D649 Anemia, unspecified: Secondary | ICD-10-CM

## 2011-04-24 DIAGNOSIS — E785 Hyperlipidemia, unspecified: Secondary | ICD-10-CM | POA: Diagnosis not present

## 2011-04-24 DIAGNOSIS — I10 Essential (primary) hypertension: Secondary | ICD-10-CM

## 2011-04-24 NOTE — Progress Notes (Signed)
Subjective:    Patient ID: Jasmine [name], female    DOB: 05-28-1922, 78 y.o.   MRN: [id number]  HPI Patient is an 78 year old white female who is followed for hyperlipidemia hypertension history of gastroesophageal reflux and known coronary artery disease who is followed by Dr. York Ram at [facility] cardiology. She was seen by Dr. Clarene Duke as a consultant in the emergency room in early December because of the presentation of chest pain that was felt to be noncardiac in etiology.  She was sent home and since has done well without recurrent chest pain   Review of Systems  Constitutional: Negative for activity change, appetite change and fatigue.  HENT: Negative for ear pain, congestion, neck pain, postnasal drip and sinus pressure.   Eyes: Negative for redness and visual disturbance.  Respiratory: Negative for cough, shortness of breath and wheezing.   Gastrointestinal: Negative for abdominal pain and abdominal distention.  Genitourinary: Negative for dysuria, frequency and menstrual problem.  Musculoskeletal: Negative for myalgias, joint swelling and arthralgias.  Skin: Negative for rash and wound.  Neurological: Negative for dizziness, weakness and headaches.  Hematological: Negative for adenopathy. Does not bruise/bleed easily.  Psychiatric/Behavioral: Negative for sleep disturbance and decreased concentration.   Past Medical History  Diagnosis Date  . Insomnia due to medical condition   . Anxiety   . Hyperlipidemia   . Hypertension   . Hypothyroidism   . Low back pain   . Left wrist fracture 09/2007  . Fracture of right shoulder 02/2008  . GERD (gastroesophageal reflux disease)   . Barrett's esophagus   . Hemorrhoids, external   . CAD (coronary artery disease) of artery bypass graft 1995  . Hiatal hernia   . History of colon polyps     History   Social History  . Marital Status: Widowed    Spouse Name: N/A    Number of Children: N/A  . Years of Education: N/A    Occupational History  . Not on file.   Social History Main Topics  . Smoking status: Former Smoker    Quit date: 04/20/1968  . Smokeless tobacco: Never Used   Comment: QUIT IN 1970  . Alcohol Use: No     STOPPED IN 1970  . Drug Use: No  . Sexually Active: Not Currently   Other Topics Concern  . Not on file   Social History Narrative  . No narrative on file    Past Surgical History  Procedure Date  . Colonoscopy w/ polypectomy 06/2005  . Carotid endarterectomy   . Cataract extraction     Family History  Problem Relation Age of Onset  . Diabetes Son   . Heart disease Mother   . Heart disease Father     Allergies  Allergen Reactions  . Codeine   . Meperidine Hcl   . Oxycodone-Acetaminophen     Current Outpatient Prescriptions on File Prior to Visit  Medication Sig Dispense Refill  . aspirin 81 MG tablet Take 81 mg by mouth daily.        Marland Kitchen atorvastatin (LIPITOR) 80 MG tablet Take 80 mg by mouth daily.        . bisoprolol (ZEBETA) 5 MG tablet Take 7.5 mg by mouth daily.       . Calcium Carbonate-Vitamin D (CALCIUM 600-D) 600-400 MG-UNIT per tablet Take 1 tablet by mouth 2 (two) times daily.        . clonazePAM (KLONOPIN) 1 MG tablet Take 0.5-1 mg by mouth 3 (three) times  daily as needed. 1 tablet twice a day if needed and 1/2 tablet at bedtime       . cyanocobalamin 500 MCG tablet Take 500 mcg by mouth daily.        Marland Kitchen diltiazem (CARDIZEM CD) 120 MG 24 hr capsule Take 120 mg by mouth daily.        Marland Kitchen levothyroxine (SYNTHROID, LEVOTHROID) 88 MCG tablet Take 1 tablet (88 mcg total) by mouth daily.  90 tablet  3  . losartan (COZAAR) 100 MG tablet Take 1 tablet (100 mg total) by mouth daily.  30 tablet  3  . mirtazapine (REMERON) 30 MG tablet Take 1 tablet (30 mg total) by mouth at bedtime.  30 tablet  1  . Multiple Vitamin (MULTIVITAMIN) capsule Take 1 capsule by mouth daily.        Marland Kitchen omeprazole (PRILOSEC) 20 MG capsule Take 1 capsule (20 mg total) by mouth daily.  90  capsule  3  . QUEtiapine (SEROQUEL) 50 MG tablet Take 1 tablet (50 mg total) by mouth at bedtime.  90 tablet  3  . Rivaroxaban (XARELTO) 20 MG TABS Take by mouth.        . vitamin E 400 UNIT capsule Take 400 Units by mouth daily.          BP 150/80  Pulse 72  Temp 98.2 F (36.8 C)  Resp 16  Ht 5\' 4"  (1.626 m)  Wt 149 lb (67.586 kg)  BMI 25.58 kg/m2       Objective:   Physical Exam  Constitutional: She is oriented to person, place, and time. She appears well-developed and well-nourished. No distress.  HENT:  Head: Normocephalic and atraumatic.  Right Ear: External ear normal.  Left Ear: External ear normal.  Nose: Nose normal.  Mouth/Throat: Oropharynx is clear and moist.  Eyes: Conjunctivae and EOM are normal. Pupils are equal, round, and reactive to light.  Neck: Normal range of motion. Neck supple. No JVD present. No tracheal deviation present. No thyromegaly present.  Cardiovascular: Normal rate, regular rhythm, normal heart sounds and intact distal pulses.   No murmur heard. Pulmonary/Chest: Effort normal and breath sounds normal. She has no wheezes. She exhibits no tenderness.  Abdominal: Soft. Bowel sounds are normal.  Musculoskeletal: Normal range of motion. She exhibits no edema and no tenderness.  Lymphadenopathy:    She has no cervical adenopathy.  Neurological: She is alert and oriented to person, place, and time. She has normal reflexes. No cranial nerve deficit.  Skin: Skin is warm and dry. She is not diaphoretic.  Psychiatric: She has a normal mood and affect. Her behavior is normal.          Assessment & Plan:  Is doing well but requires a lipid and a liver for monitoring of control of her hyperlipidemia.  From the standpoint of her coronary artery disease she has had a recent stress test done at [facility] cardiology which was reported to be normal and had a rule out in the emergency room.  She has had now controlled reflux since that episode and  has had no further chest pain.  Her blood pressure is moderately well-controlled after rest we rechecked her blood pressure today at 130/80

## 2011-04-24 NOTE — Patient Instructions (Signed)
The patient is instructed to continue all medications as prescribed. Schedule followup with check out clerk upon leaving the clinic  

## 2011-04-24 NOTE — Progress Notes (Signed)
Addended by: Rita Ohara R on: 04/24/2011 04:24 PM   Modules accepted: Orders

## 2011-04-25 LAB — CBC WITH DIFFERENTIAL/PLATELET
Hemoglobin: 13.5 g/dL (ref 12.0–15.0)
Lymphocytes Relative: 35 % (ref 12–46)
Lymphs Abs: 1.3 10*3/uL (ref 0.7–4.0)
Monocytes Relative: 11 % (ref 3–12)
Neutro Abs: 2 10*3/uL (ref 1.7–7.7)
Neutrophils Relative %: 53 % (ref 43–77)
Platelets: 131 10*3/uL — ABNORMAL LOW (ref 150–400)
RBC: 4.66 MIL/uL (ref 3.87–5.11)
WBC: 3.7 10*3/uL — ABNORMAL LOW (ref 4.0–10.5)

## 2011-04-25 LAB — HEPATIC FUNCTION PANEL
AST: 24 U/L (ref 0–37)
Alkaline Phosphatase: 87 U/L (ref 39–117)
Bilirubin, Direct: 0.2 mg/dL (ref 0.0–0.3)
Total Bilirubin: 0.7 mg/dL (ref 0.3–1.2)

## 2011-04-25 LAB — BASIC METABOLIC PANEL
Calcium: 9.3 mg/dL (ref 8.4–10.5)
Chloride: 106 mEq/L (ref 96–112)
Creat: 1.18 mg/dL — ABNORMAL HIGH (ref 0.50–1.10)

## 2011-04-25 LAB — LIPID PANEL
HDL: 44 mg/dL (ref >39)
Total CHOL/HDL Ratio: 3.3 Ratio
VLDL: 22 mg/dL (ref 0–40)

## 2011-04-25 LAB — TSH: TSH: 4.663 u[IU]/mL — ABNORMAL HIGH (ref 0.350–4.500)

## 2011-05-26 ENCOUNTER — Other Ambulatory Visit: Payer: Self-pay | Admitting: *Deleted

## 2011-05-26 MED ORDER — CLONAZEPAM 1 MG PO TABS: 0.5000 mg | ORAL_TABLET | Freq: Three times a day (TID) | ORAL | Status: DC | PRN

## 2011-05-28 ENCOUNTER — Emergency Department (HOSPITAL_COMMUNITY): Payer: Medicare Other

## 2011-05-28 ENCOUNTER — Emergency Department (HOSPITAL_COMMUNITY)
Admission: EM | Admit: 2011-05-28 | Discharge: 2011-05-28 | Disposition: A | Discharge status: Home / Self Care | Payer: Medicare Other | Attending: Emergency Medicine | Admitting: Emergency Medicine

## 2011-05-28 ENCOUNTER — Encounter (HOSPITAL_COMMUNITY): Payer: Self-pay | Admitting: *Deleted

## 2011-05-28 DIAGNOSIS — F411 Generalized anxiety disorder: Secondary | ICD-10-CM | POA: Insufficient documentation

## 2011-05-28 DIAGNOSIS — M79609 Pain in unspecified limb: Secondary | ICD-10-CM | POA: Diagnosis not present

## 2011-05-28 DIAGNOSIS — Y9229 Other specified public building as the place of occurrence of the external cause: Secondary | ICD-10-CM | POA: Insufficient documentation

## 2011-05-28 DIAGNOSIS — Z79899 Other long term (current) drug therapy: Secondary | ICD-10-CM | POA: Diagnosis not present

## 2011-05-28 DIAGNOSIS — W010XXA Fall on same level from slipping, tripping and stumbling without subsequent striking against object, initial encounter: Secondary | ICD-10-CM | POA: Insufficient documentation

## 2011-05-28 DIAGNOSIS — M7989 Other specified soft tissue disorders: Secondary | ICD-10-CM | POA: Diagnosis not present

## 2011-05-28 DIAGNOSIS — Z7982 Long term (current) use of aspirin: Secondary | ICD-10-CM | POA: Insufficient documentation

## 2011-05-28 DIAGNOSIS — E785 Hyperlipidemia, unspecified: Secondary | ICD-10-CM | POA: Insufficient documentation

## 2011-05-28 DIAGNOSIS — I2510 Atherosclerotic heart disease of native coronary artery without angina pectoris: Secondary | ICD-10-CM | POA: Insufficient documentation

## 2011-05-28 DIAGNOSIS — M25529 Pain in unspecified elbow: Secondary | ICD-10-CM | POA: Diagnosis not present

## 2011-05-28 DIAGNOSIS — IMO0001 Reserved for inherently not codable concepts without codable children: Secondary | ICD-10-CM | POA: Insufficient documentation

## 2011-05-28 DIAGNOSIS — M25579 Pain in unspecified ankle and joints of unspecified foot: Secondary | ICD-10-CM | POA: Diagnosis not present

## 2011-05-28 DIAGNOSIS — S93409A Sprain of unspecified ligament of unspecified ankle, initial encounter: Secondary | ICD-10-CM | POA: Diagnosis not present

## 2011-05-28 DIAGNOSIS — S99929A Unspecified injury of unspecified foot, initial encounter: Secondary | ICD-10-CM | POA: Diagnosis not present

## 2011-05-28 DIAGNOSIS — M25473 Effusion, unspecified ankle: Secondary | ICD-10-CM | POA: Insufficient documentation

## 2011-05-28 DIAGNOSIS — E039 Hypothyroidism, unspecified: Secondary | ICD-10-CM | POA: Insufficient documentation

## 2011-05-28 DIAGNOSIS — S9030XA Contusion of unspecified foot, initial encounter: Secondary | ICD-10-CM | POA: Diagnosis not present

## 2011-05-28 DIAGNOSIS — I10 Essential (primary) hypertension: Secondary | ICD-10-CM | POA: Diagnosis not present

## 2011-05-28 DIAGNOSIS — K219 Gastro-esophageal reflux disease without esophagitis: Secondary | ICD-10-CM | POA: Insufficient documentation

## 2011-05-28 DIAGNOSIS — S93401A Sprain of unspecified ligament of right ankle, initial encounter: Secondary | ICD-10-CM

## 2011-05-28 DIAGNOSIS — T148XXA Other injury of unspecified body region, initial encounter: Secondary | ICD-10-CM | POA: Diagnosis not present

## 2011-05-28 DIAGNOSIS — W19XXXA Unspecified fall, initial encounter: Secondary | ICD-10-CM

## 2011-05-28 DIAGNOSIS — M25476 Effusion, unspecified foot: Secondary | ICD-10-CM | POA: Insufficient documentation

## 2011-05-28 DIAGNOSIS — M7918 Myalgia, other site: Secondary | ICD-10-CM

## 2011-05-28 LAB — CBC
MCH: 30.8 pg (ref 26.0–34.0)
MCV: 91.8 fL (ref 78.0–100.0)
Platelets: 111 10*3/uL — ABNORMAL LOW (ref 150–400)
RBC: 4.39 MIL/uL (ref 3.87–5.11)
RDW: 13.8 % (ref 11.5–15.5)

## 2011-05-28 LAB — BASIC METABOLIC PANEL
CO2: 28 mEq/L (ref 19–32)
Calcium: 9.2 mg/dL (ref 8.4–10.5)
Creatinine, Ser: 1.03 mg/dL (ref 0.50–1.10)
GFR calc non Af Amer: 48 mL/min — ABNORMAL LOW (ref >90)
Glucose, Bld: 137 mg/dL — ABNORMAL HIGH (ref 70–99)
Sodium: 138 mEq/L (ref 135–145)

## 2011-05-28 LAB — DIFFERENTIAL
Basophils Absolute: 0 10*3/uL (ref 0.0–0.1)
Eosinophils Absolute: 0 10*3/uL (ref 0.0–0.7)
Eosinophils Relative: 0 % (ref 0–5)
Lymphs Abs: 0.8 10*3/uL (ref 0.7–4.0)
Monocytes Absolute: 0.6 10*3/uL (ref 0.1–1.0)
Neutrophils Relative %: 82 % — ABNORMAL HIGH (ref 43–77)

## 2011-05-28 MED ORDER — TRAMADOL HCL 50 MG PO TABS: 50.0000 mg | ORAL_TABLET | Freq: Four times a day (QID) | ORAL | Status: DC | PRN

## 2011-05-28 MED ORDER — ACETAMINOPHEN 325 MG PO TABS
650.0000 mg | ORAL_TABLET | Freq: Once | ORAL | Status: AC
  Administered 2011-05-28: 650 mg via ORAL
  Filled 2011-05-28: qty 2

## 2011-05-28 MED ORDER — TRAMADOL HCL 50 MG PO TABS
50.0000 mg | ORAL_TABLET | Freq: Once | ORAL | Status: AC
  Administered 2011-05-28: 50 mg via ORAL
  Filled 2011-05-28: qty 1

## 2011-05-28 NOTE — ED Notes (Signed)
Here by EMS from home, family to follow, here s/p fall around 1800, [place], no LOC, declined transport or medical care at that time, "wasn't that bad", "pain gradually progressively worse", c/o pain to R ankle & R elbow, bruising noted to R knee, R shin, R leg larger than L d/t swelling (R leg donor for CABG). (Denies: LOC, vomiting, visual changes, head neck or back pain), mentions some old bruising (face) from previous fall this month. Denies weakness or dizziness, states, "just wasn't watching where I was going".

## 2011-05-28 NOTE — ED Provider Notes (Signed)
History     CSN: [id number]  Arrival date & time 05/28/11  0157   First MD Initiated Contact with Patient 05/28/11 0209      Chief Complaint  Patient presents with  . Fall  . Ankle Pain  . Elbow Pain    (Consider location/radiation/quality/duration/timing/severity/associated sxs/prior treatment) HPI Patient is an 67 roll female who presents today after having a mechanical [place] the mall [place] 6 PM. Patient said she stumbled and fell. Family was with her and agrees that this was a purely mechanical fall. Patient complains of right upper extremity pain particularly around the elbow as well as right ankle and foot pain. She did not have any head injury or loss of consciousness. She has some bruising noted over the right cheek which was from a previous fall this month. Patient did not have loss of consciousness today or with prior fall. She's had no neurologic symptoms including nausea or vomiting. Patient initially refused medical evaluation following her fall but hers pain has gotten worse. Patient took Tylenol without resolution. Here patient only wants Tylenol and initially. There are no gross deformities. There are no other associated or modifying factors.  Past Medical History  Diagnosis Date  . Insomnia due to medical condition   . Anxiety   . Hyperlipidemia   . Hypertension   . Hypothyroidism   . Low back pain   . Left wrist fracture 09/2007  . Fracture of right shoulder 02/2008  . GERD (gastroesophageal reflux disease)   . Barrett's esophagus   . Hemorrhoids, external   . CAD (coronary artery disease) of artery bypass graft 1995  . Hiatal hernia   . History of colon polyps     Past Surgical History  Procedure Date  . Colonoscopy w/ polypectomy 06/2005  . Carotid endarterectomy   . Cataract extraction     Family History  Problem Relation Age of Onset  . Diabetes Son   . Heart disease Mother   . Heart disease Father     History  Substance Use Topics  . Smoking  status: Former Smoker    Quit date: 04/20/1968  . Smokeless tobacco: Never Used   Comment: QUIT IN 1970  . Alcohol Use: No     STOPPED IN 1970    OB History    Grav Para Term Preterm Abortions TAB SAB Ect Mult Living                  Review of Systems  Constitutional: Negative.   HENT: Negative.   Eyes: Negative.   Respiratory: Negative.   Cardiovascular: Negative.   Gastrointestinal: Negative.   Genitourinary: Negative.   Musculoskeletal:       See history of present illness  Skin:       Bruising  Neurological: Negative.   Hematological: Negative.   Psychiatric/Behavioral: Negative.   All other systems reviewed and are negative.    Allergies  Codeine; Meperidine hcl; and Oxycodone-acetaminophen  Home Medications   Current Outpatient Rx  Name Route Sig Dispense Refill  . ACETAMINOPHEN 500 MG PO TABS Oral Take by mouth every 6 (six) hours as needed. For pain    . ASPIRIN 81 MG PO TABS Oral Take 81 mg by mouth daily.      . ATORVASTATIN CALCIUM 80 MG PO TABS Oral Take 80 mg by mouth daily.      Marland Kitchen BISOPROLOL FUMARATE 5 MG PO TABS Oral Take 7.5 mg by mouth daily.     Marland Kitchen CALCIUM  CARBONATE-VITAMIN D 600-400 MG-UNIT PO TABS Oral Take 1 tablet by mouth 2 (two) times daily.      Marland Kitchen CLONAZEPAM 1 MG PO TABS Oral Take 0.5-1 tablets (0.5-1 mg total) by mouth 3 (three) times daily as needed. 1 tablet twice a day if needed and 1/2 tablet at bedtime 90 tablet 3  . CYANOCOBALAMIN 500 MCG PO TABS Oral Take 500 mcg by mouth daily.      Marland Kitchen DILTIAZEM HCL ER COATED BEADS 120 MG PO CP24 Oral Take 120 mg by mouth daily.      Marland Kitchen LEVOTHYROXINE SODIUM 88 MCG PO TABS Oral Take 1 tablet (88 mcg total) by mouth daily. 90 tablet 3  . LOSARTAN POTASSIUM 100 MG PO TABS Oral Take 1 tablet (100 mg total) by mouth daily. 30 tablet 3  . MIRTAZAPINE 30 MG PO TABS Oral Take 1 tablet (30 mg total) by mouth at bedtime. 30 tablet 1  . MULTIVITAMINS PO CAPS Oral Take 1 capsule by mouth daily.      Marland Kitchen OMEPRAZOLE  20 MG PO CPDR Oral Take 1 capsule (20 mg total) by mouth daily. 90 capsule 3  . QUETIAPINE FUMARATE 50 MG PO TABS Oral Take 1 tablet (50 mg total) by mouth at bedtime. 90 tablet 3  . RIVAROXABAN 20 MG PO TABS Oral Take by mouth.      Marland Kitchen VITAMIN E 400 UNITS PO CAPS Oral Take 400 Units by mouth daily.        BP 149/66  Pulse 83  Temp(Src) 98.3 F (36.8 C) (Oral)  Resp 16  SpO2 95%  Physical Exam  Nursing note and vitals reviewed. GEN: Well-developed, well-nourished female in no distress HEENT: Atraumatic, normocephalic. Oropharynx clear without erythema EYES: PERRLA BL, no scleral icterus. NECK: Trachea midline, no meningismus CV: regular rate and rhythm. No murmurs, rubs, or gallops PULM: No respiratory distress.  No crackles, wheezes, or rales. GI: soft, non-tender. No guarding, rebound, or tenderness. + bowel sounds  Neuro: cranial nerves 2-12 intact, no abnormalities of strength or sensation, A and O x 3 MSK: Patient complains of tenderness to palpation over the right distal humerus, elbow, and right proximal radius and ulna as well as over the right ankle and foot. There is swelling noted over the right ankle and foot with no obvious deformity. Patient is able to move all 4 shared these. Baseline patient has swelling of the right lower extremity from prior surgery with harvesting of veins for CABG. Psych: no abnormality of mood  ED Course  Procedures (including critical care time)  Labs Reviewed  BASIC METABOLIC PANEL - Abnormal; Notable for the following:    Glucose, Bld 137 (*)    GFR calc non Af Amer 48 (*)    GFR calc Af Amer 55 (*)    All other components within normal limits  CBC - Abnormal; Notable for the following:    Platelets 111 (*) CONSISTENT WITH PREVIOUS RESULT   All other components within normal limits  DIFFERENTIAL - Abnormal; Notable for the following:    Neutrophils Relative 82 (*)    Lymphocytes Relative 10 (*)    All other components within normal  limits   Dg Elbow Complete Right  05/28/2011  *RADIOLOGY REPORT*  Clinical Data: Right elbow pain status post fall  RIGHT ELBOW - COMPLETE 3+ VIEW  Comparison: None.  Findings: Osteopenia. No displaced fracture or dislocation identified.  No joint effusion.  IMPRESSION: No acute osseous abnormality.  Given the osteopenia, if clinical  concern for a nondisplaced fracture persist, recommend a follow-up radiograph in 5-10 days.  Original Report Authenticated By: Waneta Martins, M.D.   Dg Forearm Right  05/28/2011  *RADIOLOGY REPORT*  Clinical Data: Right elbow pain status post fall  RIGHT FOREARM - 2 VIEW  Comparison: None.  Findings: No acute fracture or aggressive osseous lesion identified.  IMPRESSION: No acute fracture of the right forearm.  Original Report Authenticated By: Waneta Martins, M.D.   Dg Ankle Complete Right  05/28/2011  *RADIOLOGY REPORT*  Clinical Data: Right ankle pain status post fall  RIGHT ANKLE - COMPLETE 3+ VIEW  Comparison: None.  Findings: Osteopenia.  Surgical clips project along the medial soft tissues.  No displaced fracture identified.  No dislocation.  IMPRESSION: No acute osseous abnormality identified.  Given the osteopenia, if clinical concern for a fracture persists, recommend a repeat radiograph in 5-10 days to evaluate for interval change or callus formation.  Original Report Authenticated By: Waneta Martins, M.D.   Dg Humerus Right  05/28/2011  *RADIOLOGY REPORT*  Clinical Data: Right arm/elbow pain status post fall.  RIGHT HUMERUS - 2+ VIEW  Comparison: 02/12/2008  Findings: Contour deformity to the right humeral neck is in keeping with prior fracture that has healed.  There is calcification along the expected course of the rotator cuff tendons versus loose bodies.  No acute humeral fracture identified.  IMPRESSION: Deformity of the right humeral neck appears similar to 2009, therefore likely sequelae of remote injury.  Original Report Authenticated By: Waneta Martins, M.D.   Dg Foot Complete Right  05/28/2011  *RADIOLOGY REPORT*  Clinical Data: Right foot pain status post fall.  Bruising.  RIGHT FOOT COMPLETE - 3+ VIEW  Comparison: None.  Findings: No displaced acute fracture or dislocation identified. No aggressive appearing osseous lesion.  IMPRESSION: No acute osseous abnormality identified.  Given the osteopenia, if clinical concern for a fracture persists, recommend a repeat radiograph in 5-10 days to evaluate for interval change or callus formation.  Original Report Authenticated By: Waneta Martins, M.D.     1. Right ankle sprain   2. Fall   3. Musculoskeletal pain       MDM  Patient was evaluated by myself and was him dynamically stable. She had a purely mechanical fall with right upper and lower journey pain. Patient was given Tylenol initially for her pain. Appropriate radiographic studies were performed and reviewed. Patient had no acute fractures. She continued to have pain and was given tramadol. Patient also had an ankle brace placed. She was then ambulated with assistance to evaluate her ambulatory status.  Patient was unable to ambulate secondary to pain. She was able to stand at the bedside. I spoke at length with the patient and her daughter. She did have some improvement in her pain the tramadol. A second doses given. Daughter was comfortable taking the patient home and maintain her at home. If they're having significant difficulty in think any home assistance they will contact their primary care doctor prior to the weekend. Patient is discharged home in good condition with prescription for tramadol.        Cyndra Numbers, MD 05/28/11 [phone]

## 2011-05-28 NOTE — ED Notes (Signed)
Pt unable to ambulate due to pain.  Pt able to stand with assistance, but unable to take a step.  All VS wnl.  No complaints of dizziness.  Pt did complain of sob

## 2011-05-28 NOTE — ED Notes (Signed)
Patient is AOx4 and comfortable with her discharge instructions. 

## 2011-05-29 ENCOUNTER — Telehealth: Payer: Self-pay | Admitting: *Deleted

## 2011-05-29 ENCOUNTER — Other Ambulatory Visit: Payer: Self-pay | Admitting: Internal Medicine

## 2011-05-29 DIAGNOSIS — W19XXXA Unspecified fall, initial encounter: Secondary | ICD-10-CM

## 2011-05-29 DIAGNOSIS — T148XXA Other injury of unspecified body region, initial encounter: Secondary | ICD-10-CM

## 2011-05-29 NOTE — Telephone Encounter (Signed)
Ok for rehab per dr Lovell Sheehan

## 2011-05-29 NOTE — Telephone Encounter (Signed)
Order sent.

## 2011-05-29 NOTE — Telephone Encounter (Signed)
Pt fell at the [facility] Wed night and went to the ER.  No fractures, but badly bruised and will not get up and down, and daughter cannot get her up and down.  Ankle has a brace and is bruised, hip is bruised and right arm is sore, and will not use it.  Daughter cannot handle  Her and thinks she may need a Rehab, etc.

## 2011-06-02 NOTE — Telephone Encounter (Signed)
Please confirm that home health care has been contacted

## 2011-06-02 NOTE — Progress Notes (Signed)
Addended by: Noralee Stain D on: 06/02/2011 01:35 PM   Modules accepted: Orders

## 2011-06-02 NOTE — Telephone Encounter (Signed)
Pt is calling back stating there has been no help that has come to the house and if something isnt' done, it will be no need to come. Please have someone call from Home Health or Dr. Lovell Sheehan nurse.

## 2011-06-03 DIAGNOSIS — R269 Unspecified abnormalities of gait and mobility: Secondary | ICD-10-CM | POA: Diagnosis not present

## 2011-06-03 DIAGNOSIS — M25519 Pain in unspecified shoulder: Secondary | ICD-10-CM | POA: Diagnosis not present

## 2011-06-03 DIAGNOSIS — IMO0001 Reserved for inherently not codable concepts without codable children: Secondary | ICD-10-CM | POA: Diagnosis not present

## 2011-06-03 DIAGNOSIS — S93609A Unspecified sprain of unspecified foot, initial encounter: Secondary | ICD-10-CM | POA: Diagnosis not present

## 2011-06-03 DIAGNOSIS — Z9181 History of falling: Secondary | ICD-10-CM | POA: Diagnosis not present

## 2011-06-03 DIAGNOSIS — I10 Essential (primary) hypertension: Secondary | ICD-10-CM | POA: Diagnosis not present

## 2011-06-04 DIAGNOSIS — S42253A Displaced fracture of greater tuberosity of unspecified humerus, initial encounter for closed fracture: Secondary | ICD-10-CM | POA: Diagnosis not present

## 2011-06-04 DIAGNOSIS — S93419A Sprain of calcaneofibular ligament of unspecified ankle, initial encounter: Secondary | ICD-10-CM | POA: Diagnosis not present

## 2011-06-05 DIAGNOSIS — Z9181 History of falling: Secondary | ICD-10-CM | POA: Diagnosis not present

## 2011-06-05 DIAGNOSIS — M25519 Pain in unspecified shoulder: Secondary | ICD-10-CM | POA: Diagnosis not present

## 2011-06-05 DIAGNOSIS — S93609A Unspecified sprain of unspecified foot, initial encounter: Secondary | ICD-10-CM | POA: Diagnosis not present

## 2011-06-05 DIAGNOSIS — I10 Essential (primary) hypertension: Secondary | ICD-10-CM | POA: Diagnosis not present

## 2011-06-05 DIAGNOSIS — R269 Unspecified abnormalities of gait and mobility: Secondary | ICD-10-CM | POA: Diagnosis not present

## 2011-06-05 DIAGNOSIS — IMO0001 Reserved for inherently not codable concepts without codable children: Secondary | ICD-10-CM | POA: Diagnosis not present

## 2011-06-08 DIAGNOSIS — Z9181 History of falling: Secondary | ICD-10-CM | POA: Diagnosis not present

## 2011-06-08 DIAGNOSIS — R269 Unspecified abnormalities of gait and mobility: Secondary | ICD-10-CM | POA: Diagnosis not present

## 2011-06-08 DIAGNOSIS — S93609A Unspecified sprain of unspecified foot, initial encounter: Secondary | ICD-10-CM | POA: Diagnosis not present

## 2011-06-08 DIAGNOSIS — IMO0001 Reserved for inherently not codable concepts without codable children: Secondary | ICD-10-CM | POA: Diagnosis not present

## 2011-06-08 DIAGNOSIS — I10 Essential (primary) hypertension: Secondary | ICD-10-CM | POA: Diagnosis not present

## 2011-06-08 DIAGNOSIS — M25519 Pain in unspecified shoulder: Secondary | ICD-10-CM | POA: Diagnosis not present

## 2011-06-10 ENCOUNTER — Telehealth: Payer: Self-pay | Admitting: Internal Medicine

## 2011-06-10 DIAGNOSIS — R269 Unspecified abnormalities of gait and mobility: Secondary | ICD-10-CM | POA: Diagnosis not present

## 2011-06-10 DIAGNOSIS — S93609A Unspecified sprain of unspecified foot, initial encounter: Secondary | ICD-10-CM | POA: Diagnosis not present

## 2011-06-10 DIAGNOSIS — M25519 Pain in unspecified shoulder: Secondary | ICD-10-CM | POA: Diagnosis not present

## 2011-06-10 DIAGNOSIS — I10 Essential (primary) hypertension: Secondary | ICD-10-CM | POA: Diagnosis not present

## 2011-06-10 DIAGNOSIS — IMO0001 Reserved for inherently not codable concepts without codable children: Secondary | ICD-10-CM | POA: Diagnosis not present

## 2011-06-10 DIAGNOSIS — Z9181 History of falling: Secondary | ICD-10-CM | POA: Diagnosis not present

## 2011-06-10 NOTE — Telephone Encounter (Signed)
given

## 2011-06-10 NOTE — Telephone Encounter (Signed)
Jasmine [name] from [facility] needs a verbal order for a bedside commode. [phone] ext. [phone] Please assist.

## 2011-06-12 DIAGNOSIS — I10 Essential (primary) hypertension: Secondary | ICD-10-CM | POA: Diagnosis not present

## 2011-06-12 DIAGNOSIS — S93609A Unspecified sprain of unspecified foot, initial encounter: Secondary | ICD-10-CM | POA: Diagnosis not present

## 2011-06-12 DIAGNOSIS — Z9181 History of falling: Secondary | ICD-10-CM | POA: Diagnosis not present

## 2011-06-12 DIAGNOSIS — R269 Unspecified abnormalities of gait and mobility: Secondary | ICD-10-CM | POA: Diagnosis not present

## 2011-06-12 DIAGNOSIS — IMO0001 Reserved for inherently not codable concepts without codable children: Secondary | ICD-10-CM | POA: Diagnosis not present

## 2011-06-12 DIAGNOSIS — M25519 Pain in unspecified shoulder: Secondary | ICD-10-CM | POA: Diagnosis not present

## 2011-06-15 DIAGNOSIS — I10 Essential (primary) hypertension: Secondary | ICD-10-CM | POA: Diagnosis not present

## 2011-06-15 DIAGNOSIS — M25519 Pain in unspecified shoulder: Secondary | ICD-10-CM | POA: Diagnosis not present

## 2011-06-15 DIAGNOSIS — IMO0001 Reserved for inherently not codable concepts without codable children: Secondary | ICD-10-CM | POA: Diagnosis not present

## 2011-06-15 DIAGNOSIS — R269 Unspecified abnormalities of gait and mobility: Secondary | ICD-10-CM | POA: Diagnosis not present

## 2011-06-15 DIAGNOSIS — Z9181 History of falling: Secondary | ICD-10-CM | POA: Diagnosis not present

## 2011-06-15 DIAGNOSIS — S93609A Unspecified sprain of unspecified foot, initial encounter: Secondary | ICD-10-CM | POA: Diagnosis not present

## 2011-06-17 DIAGNOSIS — I10 Essential (primary) hypertension: Secondary | ICD-10-CM | POA: Diagnosis not present

## 2011-06-17 DIAGNOSIS — R269 Unspecified abnormalities of gait and mobility: Secondary | ICD-10-CM | POA: Diagnosis not present

## 2011-06-17 DIAGNOSIS — S93609A Unspecified sprain of unspecified foot, initial encounter: Secondary | ICD-10-CM | POA: Diagnosis not present

## 2011-06-17 DIAGNOSIS — IMO0001 Reserved for inherently not codable concepts without codable children: Secondary | ICD-10-CM | POA: Diagnosis not present

## 2011-06-17 DIAGNOSIS — Z9181 History of falling: Secondary | ICD-10-CM | POA: Diagnosis not present

## 2011-06-17 DIAGNOSIS — M25519 Pain in unspecified shoulder: Secondary | ICD-10-CM | POA: Diagnosis not present

## 2011-06-18 ENCOUNTER — Encounter: Payer: Self-pay | Admitting: Gastroenterology

## 2011-06-18 DIAGNOSIS — S42253A Displaced fracture of greater tuberosity of unspecified humerus, initial encounter for closed fracture: Secondary | ICD-10-CM | POA: Diagnosis not present

## 2011-06-19 DIAGNOSIS — M25519 Pain in unspecified shoulder: Secondary | ICD-10-CM | POA: Diagnosis not present

## 2011-06-19 DIAGNOSIS — S93609A Unspecified sprain of unspecified foot, initial encounter: Secondary | ICD-10-CM | POA: Diagnosis not present

## 2011-06-19 DIAGNOSIS — Z9181 History of falling: Secondary | ICD-10-CM | POA: Diagnosis not present

## 2011-06-19 DIAGNOSIS — IMO0001 Reserved for inherently not codable concepts without codable children: Secondary | ICD-10-CM | POA: Diagnosis not present

## 2011-06-19 DIAGNOSIS — I10 Essential (primary) hypertension: Secondary | ICD-10-CM | POA: Diagnosis not present

## 2011-06-19 DIAGNOSIS — R269 Unspecified abnormalities of gait and mobility: Secondary | ICD-10-CM | POA: Diagnosis not present

## 2011-06-22 DIAGNOSIS — M25519 Pain in unspecified shoulder: Secondary | ICD-10-CM | POA: Diagnosis not present

## 2011-06-22 DIAGNOSIS — R269 Unspecified abnormalities of gait and mobility: Secondary | ICD-10-CM | POA: Diagnosis not present

## 2011-06-22 DIAGNOSIS — S93609A Unspecified sprain of unspecified foot, initial encounter: Secondary | ICD-10-CM | POA: Diagnosis not present

## 2011-06-22 DIAGNOSIS — Z9181 History of falling: Secondary | ICD-10-CM | POA: Diagnosis not present

## 2011-06-22 DIAGNOSIS — IMO0001 Reserved for inherently not codable concepts without codable children: Secondary | ICD-10-CM | POA: Diagnosis not present

## 2011-06-22 DIAGNOSIS — I10 Essential (primary) hypertension: Secondary | ICD-10-CM | POA: Diagnosis not present

## 2011-06-25 DIAGNOSIS — M25519 Pain in unspecified shoulder: Secondary | ICD-10-CM | POA: Diagnosis not present

## 2011-06-25 DIAGNOSIS — R269 Unspecified abnormalities of gait and mobility: Secondary | ICD-10-CM | POA: Diagnosis not present

## 2011-06-25 DIAGNOSIS — I10 Essential (primary) hypertension: Secondary | ICD-10-CM | POA: Diagnosis not present

## 2011-06-25 DIAGNOSIS — S93609A Unspecified sprain of unspecified foot, initial encounter: Secondary | ICD-10-CM | POA: Diagnosis not present

## 2011-06-25 DIAGNOSIS — Z9181 History of falling: Secondary | ICD-10-CM | POA: Diagnosis not present

## 2011-06-25 DIAGNOSIS — IMO0001 Reserved for inherently not codable concepts without codable children: Secondary | ICD-10-CM | POA: Diagnosis not present

## 2011-07-02 DIAGNOSIS — M25559 Pain in unspecified hip: Secondary | ICD-10-CM | POA: Diagnosis not present

## 2011-07-02 DIAGNOSIS — S42253A Displaced fracture of greater tuberosity of unspecified humerus, initial encounter for closed fracture: Secondary | ICD-10-CM | POA: Diagnosis not present

## 2011-07-08 DIAGNOSIS — S42253A Displaced fracture of greater tuberosity of unspecified humerus, initial encounter for closed fracture: Secondary | ICD-10-CM | POA: Diagnosis not present

## 2011-07-09 ENCOUNTER — Telehealth: Payer: Self-pay | Admitting: Internal Medicine

## 2011-07-09 DIAGNOSIS — S42253A Displaced fracture of greater tuberosity of unspecified humerus, initial encounter for closed fracture: Secondary | ICD-10-CM | POA: Diagnosis not present

## 2011-07-09 DIAGNOSIS — M25579 Pain in unspecified ankle and joints of unspecified foot: Secondary | ICD-10-CM | POA: Diagnosis not present

## 2011-07-09 MED ORDER — ATORVASTATIN CALCIUM 80 MG PO TABS: 80.0000 mg | ORAL_TABLET | Freq: Every day | ORAL | Status: DC

## 2011-07-09 MED ORDER — MIRTAZAPINE 30 MG PO TABS: 30.0000 mg | ORAL_TABLET | Freq: Every day | ORAL | Status: DC

## 2011-07-09 NOTE — Telephone Encounter (Signed)
Pt requesting one refill on atorvastatin (LIPITOR) 80 MG, tablet mirtazapine (REMERON) 30 MG tablet   [name] [employer]

## 2011-07-13 DIAGNOSIS — M25579 Pain in unspecified ankle and joints of unspecified foot: Secondary | ICD-10-CM | POA: Diagnosis not present

## 2011-07-16 DIAGNOSIS — S42253A Displaced fracture of greater tuberosity of unspecified humerus, initial encounter for closed fracture: Secondary | ICD-10-CM | POA: Diagnosis not present

## 2011-07-22 DIAGNOSIS — S42253A Displaced fracture of greater tuberosity of unspecified humerus, initial encounter for closed fracture: Secondary | ICD-10-CM | POA: Diagnosis not present

## 2011-07-24 ENCOUNTER — Ambulatory Visit (INDEPENDENT_AMBULATORY_CARE_PROVIDER_SITE_OTHER): Payer: Medicare Other | Admitting: Internal Medicine

## 2011-07-24 ENCOUNTER — Encounter: Payer: Self-pay | Admitting: Internal Medicine

## 2011-07-24 VITALS — BP 130/72 | HR 72 | Temp 98.0°F | Resp 16 | Ht 64.0 in | Wt 143.0 lb

## 2011-07-24 DIAGNOSIS — K219 Gastro-esophageal reflux disease without esophagitis: Secondary | ICD-10-CM

## 2011-07-24 DIAGNOSIS — F329 Major depressive disorder, single episode, unspecified: Secondary | ICD-10-CM | POA: Diagnosis not present

## 2011-07-24 DIAGNOSIS — E785 Hyperlipidemia, unspecified: Secondary | ICD-10-CM

## 2011-07-24 MED ORDER — OMEPRAZOLE 20 MG PO CPDR: 20.0000 mg | DELAYED_RELEASE_CAPSULE | Freq: Every day | ORAL | Status: DC

## 2011-07-24 MED ORDER — MIRTAZAPINE 30 MG PO TABS: 30.0000 mg | ORAL_TABLET | Freq: Every day | ORAL | Status: DC

## 2011-07-24 MED ORDER — ATORVASTATIN CALCIUM 80 MG PO TABS: 80.0000 mg | ORAL_TABLET | Freq: Every day | ORAL | Status: DC

## 2011-07-24 MED ORDER — MIRTAZAPINE 45 MG PO TABS: 45.0000 mg | ORAL_TABLET | Freq: Every day | ORAL | Status: DC

## 2011-07-24 NOTE — Progress Notes (Signed)
Subjective:    Patient ID: Jasmine [name], female    DOB: 1923/02/27, 78 y.o.   MRN: [id number]  HPI patient is an 78 year old female with a history of hypertension hyperlipidemia gastroesophageal reflux and mild to moderate depression who presents with increased frequency of falling of unknown etiology over the past month she's had 2 falls with significant bruising one was an injury to her foot and one was an injury to her forearm.  She saw the orthopedist who diagnosed her with a stress fracture to her forearm and she is a healing fracture to her foot she states that she is not warm walking she is lethargic she is sleeping too much staying in bed and is obviously depressed.  Her daughter is with her confirms this diagnosis    Review of Systems  Constitutional: Positive for fatigue and unexpected weight change. Negative for activity change and appetite change.  HENT: Negative for ear pain, congestion, neck pain, postnasal drip and sinus pressure.   Eyes: Negative for redness and visual disturbance.  Respiratory: Negative for cough, shortness of breath and wheezing.   Cardiovascular: Positive for palpitations and leg swelling.  Gastrointestinal: Negative for abdominal pain and abdominal distention.  Genitourinary: Negative for dysuria, frequency and menstrual problem.  Musculoskeletal: Negative for myalgias, joint swelling and arthralgias.  Skin: Negative for rash and wound.  Neurological: Positive for weakness. Negative for dizziness and headaches.  Hematological: Negative for adenopathy. Does not bruise/bleed easily.  Psychiatric/Behavioral: Negative for sleep disturbance and decreased concentration.   Past Medical History  Diagnosis Date  . Insomnia due to medical condition   . Anxiety   . Hyperlipidemia   . Hypertension   . Hypothyroidism   . Low back pain   . Left wrist fracture 09/2007  . Fracture of right shoulder 02/2008  . GERD (gastroesophageal reflux disease)   .  Barrett's esophagus   . Hemorrhoids, external   . CAD (coronary artery disease) of artery bypass graft 1995  . Hiatal hernia   . History of colon polyps     History   Social History  . Marital Status: Widowed    Spouse Name: N/A    Number of Children: N/A  . Years of Education: N/A   Occupational History  . Not on file.   Social History Main Topics  . Smoking status: Former Smoker    Quit date: 04/20/1968  . Smokeless tobacco: Never Used   Comment: QUIT IN 1970  . Alcohol Use: No     STOPPED IN 1970  . Drug Use: No  . Sexually Active: Not Currently   Other Topics Concern  . Not on file   Social History Narrative  . No narrative on file    Past Surgical History  Procedure Date  . Colonoscopy w/ polypectomy 06/2005  . Carotid endarterectomy   . Cataract extraction     Family History  Problem Relation Age of Onset  . Diabetes Son   . Heart disease Mother   . Heart disease Father     Allergies  Allergen Reactions  . Codeine   . Meperidine Hcl   . Oxycodone-Acetaminophen     Current Outpatient Prescriptions on File Prior to Visit  Medication Sig Dispense Refill  . acetaminophen (TYLENOL) 500 MG tablet Take by mouth every 6 (six) hours as needed. For pain      . aspirin 81 MG tablet Take 81 mg by mouth daily.        . bisoprolol (ZEBETA) 5  MG tablet Take 7.5 mg by mouth daily.       . Calcium Carbonate-Vitamin D (CALCIUM 600-D) 600-400 MG-UNIT per tablet Take 1 tablet by mouth 2 (two) times daily.        . clonazePAM (KLONOPIN) 1 MG tablet Take 0.5-1 tablets (0.5-1 mg total) by mouth 3 (three) times daily as needed. 1 tablet twice a day if needed and 1/2 tablet at bedtime  90 tablet  3  . cyanocobalamin 500 MCG tablet Take 500 mcg by mouth daily.        Marland Kitchen diltiazem (CARDIZEM CD) 120 MG 24 hr capsule Take 120 mg by mouth daily.        Marland Kitchen levothyroxine (SYNTHROID, LEVOTHROID) 88 MCG tablet Take 1 tablet (88 mcg total) by mouth daily.  90 tablet  3  . losartan  (COZAAR) 100 MG tablet Take 1 tablet (100 mg total) by mouth daily.  30 tablet  3  . Multiple Vitamin (MULTIVITAMIN) capsule Take 1 capsule by mouth daily.        . QUEtiapine (SEROQUEL) 50 MG tablet Take 1 tablet (50 mg total) by mouth at bedtime.  90 tablet  3  . Rivaroxaban (XARELTO) 20 MG TABS Take by mouth.        . vitamin E 400 UNIT capsule Take 400 Units by mouth daily.        Marland Kitchen DISCONTD: atorvastatin (LIPITOR) 80 MG tablet Take 1 tablet (80 mg total) by mouth daily.  90 tablet  3  . DISCONTD: mirtazapine (REMERON) 30 MG tablet Take 1 tablet (30 mg total) by mouth at bedtime.  90 tablet  3  . DISCONTD: omeprazole (PRILOSEC) 20 MG capsule Take 1 capsule (20 mg total) by mouth daily.  90 capsule  3    BP 130/72  Pulse 72  Temp 98 F (36.7 C)  Resp 16  Ht 5\' 4"  (1.626 m)  Wt 143 lb (64.864 kg)  BMI 24.55 kg/m2       Objective:   Physical Exam  Nursing note and vitals reviewed. Constitutional: She is oriented to person, place, and time. She appears well-developed and well-nourished. No distress.  HENT:  Head: Normocephalic and atraumatic.  Right Ear: External ear normal.  Left Ear: External ear normal.  Nose: Nose normal.  Mouth/Throat: Oropharynx is clear and moist.  Eyes: Conjunctivae and EOM are normal. Pupils are equal, round, and reactive to light.  Neck: Normal range of motion. Neck supple. No JVD present. No tracheal deviation present. No thyromegaly present.  Cardiovascular: Normal rate, regular rhythm, normal heart sounds and intact distal pulses.   No murmur heard. Pulmonary/Chest: Effort normal and breath sounds normal. She has no wheezes. She exhibits no tenderness.  Abdominal: Soft. Bowel sounds are normal.  Musculoskeletal: Normal range of motion. She exhibits no edema and no tenderness.  Lymphadenopathy:    She has no cervical adenopathy.  Neurological: She is alert and oriented to person, place, and time. She has normal reflexes. No cranial nerve deficit.    Skin: Skin is warm and dry. She is not diaphoretic.  Psychiatric: She has a normal mood and affect. Her behavior is normal.          Assessment & Plan:  Patient is an 78 year old female with history of hyperlipidemia and hypertension these are stable but has D. stabilized as her balance strength and her fall risk is significantly increased we discussed physical therapy and exercise and the role that strengthening and gait safety plays in her  longevity  Dr. states that she sits in a lawn chair all day and is not exercising we discussed the probability that if this continues she will have increasing falls, cases from these falls and possibly the need institutional care  We will increase her Remeron from 30-45 to aid her depression and we have set a goal of 15 minutes of walking or exercise every day as a starting point.

## 2011-07-24 NOTE — Patient Instructions (Addendum)
You need to plan to walk a little bit every day start off with 10 minutes every day without any excuse if it's cold outside and you walk around in circles for 15 minutes  Date 1-1/2 of Remeron or 45 mg of Remeron until the new prescription gets her

## 2011-07-27 DIAGNOSIS — S42253A Displaced fracture of greater tuberosity of unspecified humerus, initial encounter for closed fracture: Secondary | ICD-10-CM | POA: Diagnosis not present

## 2011-07-29 DIAGNOSIS — S42253A Displaced fracture of greater tuberosity of unspecified humerus, initial encounter for closed fracture: Secondary | ICD-10-CM | POA: Diagnosis not present

## 2011-08-03 DIAGNOSIS — S42253A Displaced fracture of greater tuberosity of unspecified humerus, initial encounter for closed fracture: Secondary | ICD-10-CM | POA: Diagnosis not present

## 2011-08-05 DIAGNOSIS — S42253A Displaced fracture of greater tuberosity of unspecified humerus, initial encounter for closed fracture: Secondary | ICD-10-CM | POA: Diagnosis not present

## 2011-08-06 DIAGNOSIS — H5200 Hypermetropia, unspecified eye: Secondary | ICD-10-CM | POA: Diagnosis not present

## 2011-08-06 DIAGNOSIS — H43399 Other vitreous opacities, unspecified eye: Secondary | ICD-10-CM | POA: Diagnosis not present

## 2011-08-06 DIAGNOSIS — H52229 Regular astigmatism, unspecified eye: Secondary | ICD-10-CM | POA: Diagnosis not present

## 2011-08-06 DIAGNOSIS — Z961 Presence of intraocular lens: Secondary | ICD-10-CM | POA: Diagnosis not present

## 2011-08-07 DIAGNOSIS — I2510 Atherosclerotic heart disease of native coronary artery without angina pectoris: Secondary | ICD-10-CM | POA: Diagnosis not present

## 2011-08-07 DIAGNOSIS — I4891 Unspecified atrial fibrillation: Secondary | ICD-10-CM | POA: Diagnosis not present

## 2011-08-07 DIAGNOSIS — I10 Essential (primary) hypertension: Secondary | ICD-10-CM | POA: Diagnosis not present

## 2011-08-10 DIAGNOSIS — M25579 Pain in unspecified ankle and joints of unspecified foot: Secondary | ICD-10-CM | POA: Diagnosis not present

## 2011-08-10 DIAGNOSIS — S42253A Displaced fracture of greater tuberosity of unspecified humerus, initial encounter for closed fracture: Secondary | ICD-10-CM | POA: Diagnosis not present

## 2011-08-12 DIAGNOSIS — M25579 Pain in unspecified ankle and joints of unspecified foot: Secondary | ICD-10-CM | POA: Diagnosis not present

## 2011-08-12 DIAGNOSIS — S42253A Displaced fracture of greater tuberosity of unspecified humerus, initial encounter for closed fracture: Secondary | ICD-10-CM | POA: Diagnosis not present

## 2011-08-17 DIAGNOSIS — M25579 Pain in unspecified ankle and joints of unspecified foot: Secondary | ICD-10-CM | POA: Diagnosis not present

## 2011-08-17 DIAGNOSIS — S42253A Displaced fracture of greater tuberosity of unspecified humerus, initial encounter for closed fracture: Secondary | ICD-10-CM | POA: Diagnosis not present

## 2011-08-19 DIAGNOSIS — S42253A Displaced fracture of greater tuberosity of unspecified humerus, initial encounter for closed fracture: Secondary | ICD-10-CM | POA: Diagnosis not present

## 2011-08-24 DIAGNOSIS — M75120 Complete rotator cuff tear or rupture of unspecified shoulder, not specified as traumatic: Secondary | ICD-10-CM | POA: Diagnosis not present

## 2011-08-26 DIAGNOSIS — M75120 Complete rotator cuff tear or rupture of unspecified shoulder, not specified as traumatic: Secondary | ICD-10-CM | POA: Diagnosis not present

## 2011-08-26 DIAGNOSIS — S42253A Displaced fracture of greater tuberosity of unspecified humerus, initial encounter for closed fracture: Secondary | ICD-10-CM | POA: Diagnosis not present

## 2011-08-31 DIAGNOSIS — S42253A Displaced fracture of greater tuberosity of unspecified humerus, initial encounter for closed fracture: Secondary | ICD-10-CM | POA: Diagnosis not present

## 2011-08-31 DIAGNOSIS — M75120 Complete rotator cuff tear or rupture of unspecified shoulder, not specified as traumatic: Secondary | ICD-10-CM | POA: Diagnosis not present

## 2011-09-02 DIAGNOSIS — S42253A Displaced fracture of greater tuberosity of unspecified humerus, initial encounter for closed fracture: Secondary | ICD-10-CM | POA: Diagnosis not present

## 2011-09-21 DIAGNOSIS — M25519 Pain in unspecified shoulder: Secondary | ICD-10-CM | POA: Diagnosis not present

## 2011-09-30 ENCOUNTER — Encounter: Payer: Self-pay | Admitting: Internal Medicine

## 2011-09-30 ENCOUNTER — Ambulatory Visit (INDEPENDENT_AMBULATORY_CARE_PROVIDER_SITE_OTHER): Payer: Medicare Other | Admitting: Internal Medicine

## 2011-09-30 VITALS — BP 130/76 | HR 72 | Temp 98.2°F | Resp 16 | Ht 64.0 in | Wt 142.0 lb

## 2011-09-30 DIAGNOSIS — E785 Hyperlipidemia, unspecified: Secondary | ICD-10-CM | POA: Diagnosis not present

## 2011-09-30 DIAGNOSIS — E039 Hypothyroidism, unspecified: Secondary | ICD-10-CM

## 2011-09-30 DIAGNOSIS — F419 Anxiety disorder, unspecified: Secondary | ICD-10-CM

## 2011-09-30 DIAGNOSIS — F411 Generalized anxiety disorder: Secondary | ICD-10-CM | POA: Diagnosis not present

## 2011-09-30 DIAGNOSIS — I10 Essential (primary) hypertension: Secondary | ICD-10-CM | POA: Diagnosis not present

## 2011-09-30 MED ORDER — QUETIAPINE FUMARATE 50 MG PO TABS: 50.0000 mg | ORAL_TABLET | Freq: Every day | ORAL | Status: DC

## 2011-09-30 MED ORDER — LEVOTHYROXINE SODIUM 88 MCG PO TABS: 88.0000 ug | ORAL_TABLET | Freq: Every day | ORAL | Status: DC

## 2011-09-30 MED ORDER — CLONAZEPAM 1 MG PO TABS: ORAL_TABLET | ORAL | Status: DC

## 2011-09-30 NOTE — Progress Notes (Signed)
Subjective:    Patient ID: Jasmine [name], female    DOB: 08-17-1922, 78 y.o.   MRN: [id number]  HPI Increased activity that has resulting in patient being home bound Has noted increased hard "pellet" stools that require straining. She is not on fiber or prune juice. Blood pressure is stable and she report no SOB or chest pain    Review of Systems  Constitutional: Negative for activity change, appetite change and fatigue.  HENT: Negative for ear pain, congestion, neck pain, postnasal drip and sinus pressure.   Eyes: Negative for redness and visual disturbance.  Respiratory: Negative for cough, shortness of breath and wheezing.   Gastrointestinal: Negative for abdominal pain and abdominal distention.  Genitourinary: Negative for dysuria, frequency and menstrual problem.  Musculoskeletal: Negative for myalgias, joint swelling and arthralgias.  Skin: Negative for rash and wound.  Neurological: Negative for dizziness, weakness and headaches.  Hematological: Negative for adenopathy. Does not bruise/bleed easily.  Psychiatric/Behavioral: Negative for disturbed wake/sleep cycle and decreased concentration.   Past Medical History  Diagnosis Date  . Insomnia due to medical condition   . Anxiety   . Hyperlipidemia   . Hypertension   . Hypothyroidism   . Low back pain   . Left wrist fracture 09/2007  . Fracture of right shoulder 02/2008  . GERD (gastroesophageal reflux disease)   . Barrett's esophagus   . Hemorrhoids, external   . CAD (coronary artery disease) of artery bypass graft 1995  . Hiatal hernia   . History of colon polyps     History   Social History  . Marital Status: Widowed    Spouse Name: N/A    Number of Children: N/A  . Years of Education: N/A   Occupational History  . Not on file.   Social History Main Topics  . Smoking status: Former Smoker    Quit date: 04/20/1968  . Smokeless tobacco: Never Used   Comment: QUIT IN 1970  . Alcohol Use: No     STOPPED  IN 1970  . Drug Use: No  . Sexually Active: Not Currently   Other Topics Concern  . Not on file   Social History Narrative  . No narrative on file    Past Surgical History  Procedure Date  . Colonoscopy w/ polypectomy 06/2005  . Carotid endarterectomy   . Cataract extraction     Family History  Problem Relation Age of Onset  . Diabetes Son   . Heart disease Mother   . Heart disease Father     Allergies  Allergen Reactions  . Codeine   . Meperidine Hcl   . Oxycodone-Acetaminophen     Current Outpatient Prescriptions on File Prior to Visit  Medication Sig Dispense Refill  . acetaminophen (TYLENOL) 500 MG tablet Take by mouth every 6 (six) hours as needed. For pain      . aspirin 81 MG tablet Take 81 mg by mouth daily.        Marland Kitchen atorvastatin (LIPITOR) 80 MG tablet Take 1 tablet (80 mg total) by mouth daily.  90 tablet  3  . bisoprolol (ZEBETA) 5 MG tablet Take 7.5 mg by mouth daily.       . Calcium Carbonate-Vitamin D (CALCIUM 600-D) 600-400 MG-UNIT per tablet Take 1 tablet by mouth 2 (two) times daily.        . clonazePAM (KLONOPIN) 1 MG tablet Take 0.5-1 tablets (0.5-1 mg total) by mouth 3 (three) times daily as needed. 1 tablet twice a day  if needed and 1/2 tablet at bedtime  90 tablet  3  . cyanocobalamin 500 MCG tablet Take 500 mcg by mouth daily.        Marland Kitchen diltiazem (CARDIZEM CD) 120 MG 24 hr capsule Take 120 mg by mouth daily.        Marland Kitchen losartan (COZAAR) 100 MG tablet Take 1 tablet (100 mg total) by mouth daily.  30 tablet  3  . mirtazapine (REMERON) 45 MG tablet Take 1 tablet (45 mg total) by mouth at bedtime.  90 tablet  3  . Multiple Vitamin (MULTIVITAMIN) capsule Take 1 capsule by mouth daily.        Marland Kitchen omeprazole (PRILOSEC) 20 MG capsule Take 1 capsule (20 mg total) by mouth daily.  90 capsule  3  . Rivaroxaban (XARELTO) 20 MG TABS Take by mouth.        . vitamin E 400 UNIT capsule Take 400 Units by mouth daily.        Marland Kitchen DISCONTD: levothyroxine (SYNTHROID,  LEVOTHROID) 88 MCG tablet Take 1 tablet (88 mcg total) by mouth daily.  90 tablet  3  . DISCONTD: QUEtiapine (SEROQUEL) 50 MG tablet Take 1 tablet (50 mg total) by mouth at bedtime.  90 tablet  3    BP 130/76  Pulse 72  Temp 98.2 F (36.8 C)  Resp 16  Ht 5\' 4"  (1.626 m)  Wt 142 lb (64.411 kg)  BMI 24.37 kg/m2        Objective:   Physical Exam  Nursing note and vitals reviewed. Constitutional: She is oriented to person, place, and time. She appears well-developed and well-nourished. No distress.  HENT:  Head: Normocephalic and atraumatic.  Right Ear: External ear normal.  Left Ear: External ear normal.  Nose: Nose normal.  Mouth/Throat: Oropharynx is clear and moist.  Eyes: Conjunctivae and EOM are normal. Pupils are equal, round, and reactive to light.  Neck: Normal range of motion. Neck supple. No JVD present. No tracheal deviation present. No thyromegaly present.  Cardiovascular: Normal rate, regular rhythm, normal heart sounds and intact distal pulses.   No murmur heard. Pulmonary/Chest: Effort normal and breath sounds normal. She has no wheezes. She exhibits no tenderness.  Abdominal: Soft. Bowel sounds are normal.  Musculoskeletal: Normal range of motion. She exhibits no edema and no tenderness.  Lymphadenopathy:    She has no cervical adenopathy.  Neurological: She is alert and oriented to person, place, and time. She has normal reflexes. No cranial nerve deficit.  Skin: Skin is warm and dry. She is not diaphoretic.  Psychiatric: She has a normal mood and affect. Her behavior is normal.          Assessment & Plan:  Increased anxiety so we'll titrate the Klonopin slightly to assist her with activities of daily living she is stable from the standpoint of her heart and blood pressure  Monitoring of her cholesterol her basic metabolic panel and liver functions as well as her thyroid was accomplished in January and a repeat thyroid study should be done today

## 2011-09-30 NOTE — Patient Instructions (Addendum)
We are increasing the klonopin to help you be less anxious and get out more!  At 78 years of age of believe that alternative means to screening her for appropriate for you than colonoscopy at this time

## 2011-12-02 DIAGNOSIS — I4891 Unspecified atrial fibrillation: Secondary | ICD-10-CM | POA: Diagnosis not present

## 2011-12-02 DIAGNOSIS — I10 Essential (primary) hypertension: Secondary | ICD-10-CM | POA: Diagnosis not present

## 2011-12-02 DIAGNOSIS — I2510 Atherosclerotic heart disease of native coronary artery without angina pectoris: Secondary | ICD-10-CM | POA: Diagnosis not present

## 2011-12-30 ENCOUNTER — Telehealth: Payer: Self-pay | Admitting: Internal Medicine

## 2011-12-30 NOTE — Telephone Encounter (Signed)
Caller: [name]/Child; Patient Name: Jasmine [name]; PCP: Darryll Capers (Adults only); Best Callback Phone Number: [phone]; Reason for call: Pt. reported to her daughter that she didn't feel well, but was unable to articulate how.  [name] called the triage nurse and left her number 2 hours ago.  This triage nurse called and spoke to both Eunice Blase (daughter) and the pt. and she said that when she woke up from her nap, she felt nauseated, but that it passed and she feels completely normal now.  Instructed both Pt. and Daughter to call back if pt. develops symptoms that need triaging.  CAN/db.

## 2012-01-11 ENCOUNTER — Ambulatory Visit: Payer: Medicare Other | Admitting: Internal Medicine

## 2012-01-11 ENCOUNTER — Telehealth: Payer: Self-pay | Admitting: Internal Medicine

## 2012-01-11 DIAGNOSIS — L723 Sebaceous cyst: Secondary | ICD-10-CM | POA: Diagnosis not present

## 2012-01-11 MED ORDER — LEVOTHYROXINE SODIUM 88 MCG PO TABS: 88.0000 ug | ORAL_TABLET | Freq: Every day | ORAL | Status: DC

## 2012-01-11 NOTE — Telephone Encounter (Signed)
Pt is waiting for mail order med to come in and wants it sent to [facility], rx sent in electronically

## 2012-01-11 NOTE — Telephone Encounter (Signed)
Patient's daughter called stating that her mom need a refill of [facility]. Please assist and inform patient's daughter when done.

## 2012-01-12 ENCOUNTER — Ambulatory Visit: Payer: Medicare Other | Admitting: Internal Medicine

## 2012-01-26 ENCOUNTER — Encounter: Payer: Self-pay | Admitting: Internal Medicine

## 2012-01-26 ENCOUNTER — Ambulatory Visit (INDEPENDENT_AMBULATORY_CARE_PROVIDER_SITE_OTHER): Payer: Medicare Other | Admitting: Internal Medicine

## 2012-01-26 VITALS — BP 130/78 | HR 72 | Temp 98.6°F | Resp 16 | Ht 64.0 in | Wt 140.0 lb

## 2012-01-26 DIAGNOSIS — I10 Essential (primary) hypertension: Secondary | ICD-10-CM | POA: Diagnosis not present

## 2012-01-26 DIAGNOSIS — M545 Low back pain: Secondary | ICD-10-CM

## 2012-01-26 DIAGNOSIS — E785 Hyperlipidemia, unspecified: Secondary | ICD-10-CM

## 2012-01-26 MED ORDER — RIVAROXABAN 20 MG PO TABS: 20.0000 mg | ORAL_TABLET | Freq: Every day | ORAL | Status: DC

## 2012-01-26 MED ORDER — LEVOTHYROXINE SODIUM 88 MCG PO TABS: 88.0000 ug | ORAL_TABLET | Freq: Every day | ORAL | Status: DC

## 2012-01-26 MED ORDER — LOSARTAN POTASSIUM 100 MG PO TABS: 100.0000 mg | ORAL_TABLET | Freq: Every day | ORAL | Status: DC

## 2012-01-26 MED ORDER — BISOPROLOL FUMARATE 5 MG PO TABS: 7.5000 mg | ORAL_TABLET | Freq: Every day | ORAL | Status: DC

## 2012-01-26 NOTE — Progress Notes (Signed)
Subjective:    Patient ID: Jasmine [name], female    DOB: 1922/07/23, 78 y.o.   MRN: [id number]  HPI Pt presents for follow up Has not been walking Note decline in her strength and mobility   Review of Systems  Constitutional: Positive for unexpected weight change. Negative for activity change, appetite change and fatigue.  HENT: Negative for ear pain, congestion, neck pain, postnasal drip and sinus pressure.   Eyes: Negative for redness and visual disturbance.  Respiratory: Positive for shortness of breath. Negative for cough and wheezing.   Cardiovascular: Positive for palpitations.  Gastrointestinal: Negative for abdominal pain and abdominal distention.  Genitourinary: Negative for dysuria, frequency and menstrual problem.  Musculoskeletal: Negative for myalgias, joint swelling and arthralgias.  Skin: Negative for rash and wound.  Neurological: Positive for weakness. Negative for dizziness and headaches.  Hematological: Negative for adenopathy. Does not bruise/bleed easily.  Psychiatric/Behavioral: Positive for dysphoric mood and decreased concentration. Negative for disturbed wake/sleep cycle.   Past Medical History  Diagnosis Date  . Insomnia due to medical condition   . Anxiety   . Hyperlipidemia   . Hypertension   . Hypothyroidism   . Low back pain   . Left wrist fracture 09/2007  . Fracture of right shoulder 02/2008  . GERD (gastroesophageal reflux disease)   . Barrett's esophagus   . Hemorrhoids, external   . CAD (coronary artery disease) of artery bypass graft 1995  . Hiatal hernia   . History of colon polyps     History   Social History  . Marital Status: Widowed    Spouse Name: N/A    Number of Children: N/A  . Years of Education: N/A   Occupational History  . Not on file.   Social History Main Topics  . Smoking status: Former Smoker    Quit date: 04/20/1968  . Smokeless tobacco: Never Used   Comment: QUIT IN 1970  . Alcohol Use: No     STOPPED IN  1970  . Drug Use: No  . Sexually Active: Not Currently   Other Topics Concern  . Not on file   Social History Narrative  . No narrative on file    Past Surgical History  Procedure Date  . Colonoscopy w/ polypectomy 06/2005  . Carotid endarterectomy   . Cataract extraction     Family History  Problem Relation Age of Onset  . Diabetes Son   . Heart disease Mother   . Heart disease Father     Allergies  Allergen Reactions  . Codeine   . Meperidine Hcl   . Oxycodone-Acetaminophen     Current Outpatient Prescriptions on File Prior to Visit  Medication Sig Dispense Refill  . acetaminophen (TYLENOL) 500 MG tablet Take by mouth every 6 (six) hours as needed. For pain      . aspirin 81 MG tablet Take 81 mg by mouth daily.        Marland Kitchen atorvastatin (LIPITOR) 80 MG tablet Take 1 tablet (80 mg total) by mouth daily.  90 tablet  3  . Calcium Carbonate-Vitamin D (CALCIUM 600-D) 600-400 MG-UNIT per tablet Take 1 tablet by mouth 2 (two) times daily.        . clonazePAM (KLONOPIN) 1 MG tablet Take a half tablet when you awaken in the morning take a half tablet at 6:00 PM and take a whole tablet right before that  90 tablet  3  . cyanocobalamin 500 MCG tablet Take 500 mcg by mouth daily.        Marland Kitchen  diltiazem (CARDIZEM CD) 120 MG 24 hr capsule Take 120 mg by mouth daily.        . mirtazapine (REMERON) 45 MG tablet Take 1 tablet (45 mg total) by mouth at bedtime.  90 tablet  3  . Multiple Vitamin (MULTIVITAMIN) capsule Take 1 capsule by mouth daily.        Marland Kitchen omeprazole (PRILOSEC) 20 MG capsule Take 1 capsule (20 mg total) by mouth daily.  90 capsule  3  . QUEtiapine (SEROQUEL) 50 MG tablet Take 1 tablet (50 mg total) by mouth at bedtime.  90 tablet  3  . vitamin E 400 UNIT capsule Take 400 Units by mouth daily.        Marland Kitchen DISCONTD: bisoprolol (ZEBETA) 5 MG tablet Take 7.5 mg by mouth daily.       Marland Kitchen DISCONTD: levothyroxine (SYNTHROID, LEVOTHROID) 88 MCG tablet Take 1 tablet (88 mcg total) by mouth  daily.  30 tablet  0  . DISCONTD: losartan (COZAAR) 100 MG tablet Take 1 tablet (100 mg total) by mouth daily.  30 tablet  3  . DISCONTD: Rivaroxaban (XARELTO) 20 MG TABS Take by mouth.          BP 130/78  Pulse 72  Temp 98.6 F (37 C)  Resp 16  Ht 5\' 4"  (1.626 m)  Wt 140 lb (63.504 kg)  BMI 24.03 kg/m2       Objective:   Physical Exam  Nursing note and vitals reviewed. Constitutional: She is oriented to person, place, and time. She appears well-developed and well-nourished. No distress.  HENT:  Head: Normocephalic and atraumatic.  Right Ear: External ear normal.  Left Ear: External ear normal.  Nose: Nose normal.  Mouth/Throat: Oropharynx is clear and moist.  Eyes: Conjunctivae normal and EOM are normal. Pupils are equal, round, and reactive to light.  Neck: Normal range of motion. Neck supple. No JVD present. No tracheal deviation present. No thyromegaly present.  Cardiovascular: Normal rate and regular rhythm.   Murmur heard. Pulmonary/Chest: Effort normal and breath sounds normal. She has no wheezes. She exhibits no tenderness.  Abdominal: Soft. Bowel sounds are normal.  Musculoskeletal: Normal range of motion. She exhibits no edema and no tenderness.  Lymphadenopathy:    She has no cervical adenopathy.  Neurological: She is alert and oriented to person, place, and time. She has normal reflexes. No cranial nerve deficit.  Skin: Skin is warm and dry. She is not diaphoretic.  Psychiatric: She has a normal mood and affect. Her behavior is normal.          Assessment & Plan:  The patient is stable from the standpoint of her hypertension her thyroid has been monitored and lipids have been monitored.  I am concerned that she is not exercising on a regular basis as she should and a lack of exercise on a daily basis will lead and loss of mobility.  This has been in the literature shown to be an indication of decline. I've encouraged her to start exercising on a daily  basis

## 2012-01-26 NOTE — Patient Instructions (Addendum)
Jan for medicare physical

## 2012-02-09 ENCOUNTER — Encounter: Payer: Self-pay | Admitting: Gastroenterology

## 2012-02-09 DIAGNOSIS — H16109 Unspecified superficial keratitis, unspecified eye: Secondary | ICD-10-CM | POA: Diagnosis not present

## 2012-04-14 ENCOUNTER — Other Ambulatory Visit: Payer: Self-pay | Admitting: *Deleted

## 2012-04-14 DIAGNOSIS — F419 Anxiety disorder, unspecified: Secondary | ICD-10-CM

## 2012-04-14 MED ORDER — CLONAZEPAM 1 MG PO TABS: ORAL_TABLET | ORAL | Status: DC

## 2012-04-19 ENCOUNTER — Other Ambulatory Visit: Payer: Self-pay | Admitting: *Deleted

## 2012-04-19 DIAGNOSIS — F419 Anxiety disorder, unspecified: Secondary | ICD-10-CM

## 2012-04-19 MED ORDER — CLONAZEPAM 1 MG PO TABS: ORAL_TABLET | ORAL | Status: DC

## 2012-04-29 ENCOUNTER — Other Ambulatory Visit: Payer: Self-pay | Admitting: *Deleted

## 2012-04-29 ENCOUNTER — Ambulatory Visit (INDEPENDENT_AMBULATORY_CARE_PROVIDER_SITE_OTHER): Payer: Medicare Other | Admitting: Internal Medicine

## 2012-04-29 ENCOUNTER — Encounter: Payer: Self-pay | Admitting: Internal Medicine

## 2012-04-29 VITALS — BP 116/70 | HR 76 | Temp 98.6°F | Resp 16 | Ht 64.0 in | Wt 130.0 lb

## 2012-04-29 DIAGNOSIS — R5383 Other fatigue: Secondary | ICD-10-CM

## 2012-04-29 DIAGNOSIS — E785 Hyperlipidemia, unspecified: Secondary | ICD-10-CM

## 2012-04-29 DIAGNOSIS — R5381 Other malaise: Secondary | ICD-10-CM

## 2012-04-29 DIAGNOSIS — Y92009 Unspecified place in unspecified non-institutional (private) residence as the place of occurrence of the external cause: Secondary | ICD-10-CM

## 2012-04-29 DIAGNOSIS — T148XXA Other injury of unspecified body region, initial encounter: Secondary | ICD-10-CM

## 2012-04-29 DIAGNOSIS — W19XXXA Unspecified fall, initial encounter: Secondary | ICD-10-CM

## 2012-04-29 DIAGNOSIS — I10 Essential (primary) hypertension: Secondary | ICD-10-CM | POA: Diagnosis not present

## 2012-04-29 LAB — HEPATIC FUNCTION PANEL
ALT: 17 U/L (ref 0–35)
Albumin: 3.7 g/dL (ref 3.5–5.2)
Alkaline Phosphatase: 80 U/L (ref 39–117)
Bilirubin, Direct: 0.1 mg/dL (ref 0.0–0.3)
Total Protein: 6.5 g/dL (ref 6.0–8.3)

## 2012-04-29 LAB — BASIC METABOLIC PANEL
CO2: 30 mEq/L (ref 19–32)
Chloride: 103 mEq/L (ref 96–112)
Creatinine, Ser: 1.2 mg/dL (ref 0.4–1.2)
Glucose, Bld: 99 mg/dL (ref 70–99)
Sodium: 139 mEq/L (ref 135–145)

## 2012-04-29 LAB — CBC WITH DIFFERENTIAL/PLATELET
Basophils Absolute: 0 10*3/uL (ref 0.0–0.1)
Eosinophils Relative: 1.2 % (ref 0.0–5.0)
HCT: 42.6 % (ref 36.0–46.0)
Hemoglobin: 14.3 g/dL (ref 12.0–15.0)
Lymphocytes Relative: 34.9 % (ref 12.0–46.0)
Monocytes Relative: 8.8 % (ref 3.0–12.0)
Neutro Abs: 2.6 10*3/uL (ref 1.4–7.7)
RDW: 13.5 % (ref 11.5–14.6)
WBC: 4.7 10*3/uL (ref 4.5–10.5)

## 2012-04-29 LAB — LIPID PANEL: Total CHOL/HDL Ratio: 2

## 2012-04-29 LAB — TSH: TSH: 2.6 u[IU]/mL (ref 0.35–5.50)

## 2012-04-29 MED ORDER — TRAMADOL HCL 50 MG PO TABS: 50.0000 mg | ORAL_TABLET | Freq: Three times a day (TID) | ORAL | Status: DC | PRN

## 2012-04-29 NOTE — Patient Instructions (Signed)
The patient is instructed to continue all medications as prescribed. Schedule followup with check out clerk upon leaving the clinic  

## 2012-04-29 NOTE — Progress Notes (Signed)
  Subjective:    Patient ID: Jasmine [name], female    DOB: Mar 18, 1923, 79 y.o.   MRN: [id number]  HPI Patient is an 79 year old female with hypertension hyperlipidemia and hypothyroidism who recently had a fall at home in which she had a significant bruise to her left arm.  She is not certain if this was a syncopal event or just a trip which she was alert and oriented which was discovered by her daughter immediately after the fall. She is noted to be developing progressive weakness. She is not exercising. She has been losing weight due to decreased appetite   Review of Systems  Constitutional: Positive for fatigue. Negative for activity change and appetite change.  HENT: Positive for neck stiffness and ear discharge. Negative for ear pain, congestion, neck pain, postnasal drip and sinus pressure.   Eyes: Negative for redness and visual disturbance.  Respiratory: Positive for shortness of breath. Negative for cough and wheezing.   Gastrointestinal: Negative for abdominal pain and abdominal distention.  Genitourinary: Negative for dysuria, frequency and menstrual problem.  Musculoskeletal: Positive for back pain, joint swelling and gait problem. Negative for myalgias and arthralgias.  Skin: Negative for rash and wound.  Neurological: Positive for weakness. Negative for dizziness and headaches.  Hematological: Negative for adenopathy. Does not bruise/bleed easily.  Psychiatric/Behavioral: Negative for sleep disturbance and decreased concentration.       Objective:   Physical Exam  Nursing note and vitals reviewed. Constitutional: She is oriented to person, place, and time. She appears well-developed and well-nourished. No distress.  HENT:  Head: Normocephalic and atraumatic.  Right Ear: External ear normal.  Left Ear: External ear normal.  Nose: Nose normal.  Mouth/Throat: Oropharynx is clear and moist.  Eyes: Conjunctivae normal and EOM are normal. Pupils are equal, round, and  reactive to light.  Neck: Normal range of motion. Neck supple. No JVD present. No tracheal deviation present. No thyromegaly present.  Cardiovascular: Normal rate, regular rhythm and intact distal pulses.   Murmur heard. Pulmonary/Chest: Effort normal and breath sounds normal. She has no wheezes. She exhibits no tenderness.  Abdominal: Soft. Bowel sounds are normal.  Musculoskeletal: She exhibits edema and tenderness.  Lymphadenopathy:    She has no cervical adenopathy.  Neurological: She is alert and oriented to person, place, and time. She displays abnormal reflex. No cranial nerve deficit. Coordination abnormal.  Skin: Skin is warm and dry. She is not diaphoretic.  Psychiatric: She has a normal mood and affect. Her behavior is normal.          Assessment & Plan:  Fatigue and depression Progressive weakness Progressive  Decline monitoring hypothyroidism, CBC Lipids  I have spent more than 30 minutes examining this patient face-to-face of which over half was spent in counseling Discussed decline with daughter

## 2012-05-17 ENCOUNTER — Ambulatory Visit: Payer: Medicare Other | Attending: Internal Medicine | Admitting: Physical Therapy

## 2012-05-17 DIAGNOSIS — R269 Unspecified abnormalities of gait and mobility: Secondary | ICD-10-CM | POA: Insufficient documentation

## 2012-05-17 DIAGNOSIS — IMO0001 Reserved for inherently not codable concepts without codable children: Secondary | ICD-10-CM | POA: Insufficient documentation

## 2012-05-17 DIAGNOSIS — M6281 Muscle weakness (generalized): Secondary | ICD-10-CM | POA: Diagnosis not present

## 2012-05-23 ENCOUNTER — Ambulatory Visit: Payer: Medicare Other | Attending: Internal Medicine | Admitting: Physical Therapy

## 2012-05-23 DIAGNOSIS — IMO0001 Reserved for inherently not codable concepts without codable children: Secondary | ICD-10-CM | POA: Insufficient documentation

## 2012-05-23 DIAGNOSIS — M6281 Muscle weakness (generalized): Secondary | ICD-10-CM | POA: Insufficient documentation

## 2012-05-23 DIAGNOSIS — R269 Unspecified abnormalities of gait and mobility: Secondary | ICD-10-CM | POA: Diagnosis not present

## 2012-05-30 ENCOUNTER — Ambulatory Visit: Payer: Medicare Other | Admitting: Physical Therapy

## 2012-06-02 ENCOUNTER — Other Ambulatory Visit: Payer: Self-pay | Admitting: Internal Medicine

## 2012-06-06 ENCOUNTER — Ambulatory Visit: Payer: Medicare Other | Admitting: Physical Therapy

## 2012-06-13 ENCOUNTER — Ambulatory Visit: Payer: Medicare Other | Admitting: Physical Therapy

## 2012-06-20 ENCOUNTER — Ambulatory Visit: Payer: Medicare Other | Admitting: Physical Therapy

## 2012-06-24 ENCOUNTER — Other Ambulatory Visit: Payer: Self-pay | Admitting: Internal Medicine

## 2012-06-27 ENCOUNTER — Ambulatory Visit: Payer: Medicare Other | Attending: Internal Medicine | Admitting: Physical Therapy

## 2012-06-27 DIAGNOSIS — M6281 Muscle weakness (generalized): Secondary | ICD-10-CM | POA: Diagnosis not present

## 2012-06-27 DIAGNOSIS — IMO0001 Reserved for inherently not codable concepts without codable children: Secondary | ICD-10-CM | POA: Insufficient documentation

## 2012-06-27 DIAGNOSIS — R269 Unspecified abnormalities of gait and mobility: Secondary | ICD-10-CM | POA: Diagnosis not present

## 2012-06-28 DIAGNOSIS — I10 Essential (primary) hypertension: Secondary | ICD-10-CM | POA: Diagnosis not present

## 2012-06-28 DIAGNOSIS — E782 Mixed hyperlipidemia: Secondary | ICD-10-CM | POA: Diagnosis not present

## 2012-06-28 DIAGNOSIS — I2510 Atherosclerotic heart disease of native coronary artery without angina pectoris: Secondary | ICD-10-CM | POA: Diagnosis not present

## 2012-06-28 DIAGNOSIS — I4891 Unspecified atrial fibrillation: Secondary | ICD-10-CM | POA: Diagnosis not present

## 2012-07-04 ENCOUNTER — Ambulatory Visit: Payer: Medicare Other | Admitting: Physical Therapy

## 2012-07-09 ENCOUNTER — Other Ambulatory Visit: Payer: Self-pay | Admitting: Internal Medicine

## 2012-07-11 ENCOUNTER — Ambulatory Visit: Payer: Medicare Other | Admitting: Physical Therapy

## 2012-07-11 DIAGNOSIS — M6281 Muscle weakness (generalized): Secondary | ICD-10-CM | POA: Diagnosis not present

## 2012-07-11 DIAGNOSIS — R269 Unspecified abnormalities of gait and mobility: Secondary | ICD-10-CM | POA: Diagnosis not present

## 2012-07-11 DIAGNOSIS — IMO0001 Reserved for inherently not codable concepts without codable children: Secondary | ICD-10-CM | POA: Diagnosis not present

## 2012-07-16 ENCOUNTER — Other Ambulatory Visit: Payer: Self-pay | Admitting: Internal Medicine

## 2012-08-01 ENCOUNTER — Ambulatory Visit (INDEPENDENT_AMBULATORY_CARE_PROVIDER_SITE_OTHER): Payer: Medicare Other | Admitting: Internal Medicine

## 2012-08-01 ENCOUNTER — Encounter: Payer: Self-pay | Admitting: Internal Medicine

## 2012-08-01 VITALS — BP 140/74 | HR 72 | Temp 98.2°F | Resp 16 | Ht 64.0 in | Wt 134.0 lb

## 2012-08-01 DIAGNOSIS — E039 Hypothyroidism, unspecified: Secondary | ICD-10-CM | POA: Diagnosis not present

## 2012-08-01 DIAGNOSIS — E785 Hyperlipidemia, unspecified: Secondary | ICD-10-CM | POA: Diagnosis not present

## 2012-08-01 DIAGNOSIS — F419 Anxiety disorder, unspecified: Secondary | ICD-10-CM

## 2012-08-01 DIAGNOSIS — I10 Essential (primary) hypertension: Secondary | ICD-10-CM | POA: Diagnosis not present

## 2012-08-01 DIAGNOSIS — F411 Generalized anxiety disorder: Secondary | ICD-10-CM

## 2012-08-01 DIAGNOSIS — Z9181 History of falling: Secondary | ICD-10-CM

## 2012-08-01 MED ORDER — OMEPRAZOLE 20 MG PO CPDR: 20.0000 mg | DELAYED_RELEASE_CAPSULE | Freq: Every day | ORAL | Status: DC

## 2012-08-01 MED ORDER — LEVOTHYROXINE SODIUM 88 MCG PO TABS: 88.0000 ug | ORAL_TABLET | Freq: Every day | ORAL | Status: DC

## 2012-08-01 MED ORDER — CLONAZEPAM 1 MG PO TABS: ORAL_TABLET | ORAL | Status: DC

## 2012-08-01 NOTE — Patient Instructions (Addendum)
The patient is instructed to continue all medications as prescribed. Schedule followup with check out clerk upon leaving the clinic  

## 2012-08-01 NOTE — Progress Notes (Signed)
  Subjective:    Patient ID: Jasmine [name], female    DOB: 11/23/1922, 79 y.o.   MRN: [id number]  HPI The patient has not been walking or exercising    Review of Systems  Constitutional: Negative for activity change, appetite change and fatigue.  HENT: Negative for ear pain, congestion, neck pain, postnasal drip and sinus pressure.   Eyes: Negative for redness and visual disturbance.  Respiratory: Positive for shortness of breath. Negative for cough and wheezing.   Gastrointestinal: Positive for abdominal distention. Negative for abdominal pain.  Genitourinary: Negative for dysuria, frequency and menstrual problem.  Musculoskeletal: Negative for myalgias, joint swelling and arthralgias.  Skin: Negative for rash and wound.  Neurological: Negative for dizziness, weakness and headaches.  Hematological: Negative for adenopathy. Does not bruise/bleed easily.  Psychiatric/Behavioral: Negative for sleep disturbance and decreased concentration.       Objective:   Physical Exam  Nursing note and vitals reviewed. Constitutional: She is oriented to person, place, and time. She appears well-developed and well-nourished. No distress.  HENT:  Head: Normocephalic and atraumatic.  Right Ear: External ear normal.  Left Ear: External ear normal.  Nose: Nose normal.  Mouth/Throat: Oropharynx is clear and moist.  Eyes: Conjunctivae and EOM are normal. Pupils are equal, round, and reactive to light.  Neck: Normal range of motion. Neck supple. No JVD present. No tracheal deviation present. No thyromegaly present.  Cardiovascular: Normal rate, regular rhythm, normal heart sounds and intact distal pulses.   No murmur heard. Pulmonary/Chest: Effort normal and breath sounds normal. She has no wheezes. She exhibits no tenderness.  Abdominal: Soft. Bowel sounds are normal.  Musculoskeletal: Normal range of motion. She exhibits no edema and no tenderness.  Lymphadenopathy:    She has no cervical  adenopathy.  Neurological: She is alert and oriented to person, place, and time. She has normal reflexes. No cranial nerve deficit.  Skin: Skin is warm and dry. She is not diaphoretic.  Psychiatric: She has a normal mood and affect. Her behavior is normal.          Assessment & Plan:  "cold natured" Check TSH was normal Balance and strengthening Stable HTN Stab;e thyroid reviewed labs from last visit

## 2012-08-09 ENCOUNTER — Telehealth: Payer: Self-pay | Admitting: Internal Medicine

## 2012-08-09 MED ORDER — RIVAROXABAN 20 MG PO TABS: 20.0000 mg | ORAL_TABLET | Freq: Every day | ORAL | Status: DC

## 2012-08-09 NOTE — Telephone Encounter (Signed)
PT daughter called and stated that the pt needs a 2 week supply of Rivaroxaban (XARELTO) 20 MG TABS sent to [facility] in [address]. She is currently out of this medication so she will need it sent to a local pharmacy ASAP.

## 2012-08-09 NOTE — Telephone Encounter (Signed)
Sent in and pt informed. 

## 2012-10-08 ENCOUNTER — Other Ambulatory Visit: Payer: Self-pay | Admitting: Internal Medicine

## 2012-10-25 ENCOUNTER — Telehealth: Payer: Self-pay | Admitting: Internal Medicine

## 2012-10-25 DIAGNOSIS — F419 Anxiety disorder, unspecified: Secondary | ICD-10-CM

## 2012-10-25 MED ORDER — LEVOTHYROXINE SODIUM 88 MCG PO TABS: 88.0000 ug | ORAL_TABLET | Freq: Every day | ORAL | Status: DC

## 2012-10-25 MED ORDER — CLONAZEPAM 1 MG PO TABS: ORAL_TABLET | ORAL | Status: DC

## 2012-10-25 MED ORDER — DILTIAZEM HCL ER COATED BEADS 120 MG PO CP24: 120.0000 mg | ORAL_CAPSULE | Freq: Every day | ORAL | Status: DC

## 2012-10-25 NOTE — Telephone Encounter (Signed)
Opened in error

## 2012-12-02 ENCOUNTER — Encounter: Payer: Self-pay | Admitting: Internal Medicine

## 2012-12-02 ENCOUNTER — Ambulatory Visit (INDEPENDENT_AMBULATORY_CARE_PROVIDER_SITE_OTHER): Payer: Medicare Other | Admitting: Internal Medicine

## 2012-12-02 VITALS — BP 106/64 | HR 72 | Temp 98.2°F | Resp 16 | Ht 64.0 in | Wt 132.0 lb

## 2012-12-02 DIAGNOSIS — M549 Dorsalgia, unspecified: Secondary | ICD-10-CM | POA: Diagnosis not present

## 2012-12-02 DIAGNOSIS — I10 Essential (primary) hypertension: Secondary | ICD-10-CM

## 2012-12-02 MED ORDER — TRAMADOL HCL 50 MG PO TABS: 50.0000 mg | ORAL_TABLET | Freq: Three times a day (TID) | ORAL | Status: DC | PRN

## 2012-12-02 NOTE — Progress Notes (Signed)
Subjective:    Patient ID: Jasmine [name], female    DOB: 04-30-1922, 79 y.o.   MRN: [id number]  HPI Increased back pain that interfers with mobility and balance Fall and loss of balance HTN stable CAD stable Not taking the tramadol   Review of Systems  Constitutional: Positive for fatigue. Negative for activity change and appetite change.  HENT: Negative for ear pain, congestion, neck pain, postnasal drip and sinus pressure.   Eyes: Negative for redness and visual disturbance.  Respiratory: Negative for cough, shortness of breath and wheezing.   Gastrointestinal: Negative for abdominal pain and abdominal distention.  Genitourinary: Positive for frequency. Negative for dysuria and menstrual problem.  Musculoskeletal: Positive for back pain and gait problem. Negative for myalgias, joint swelling and arthralgias.  Skin: Negative for rash and wound.  Neurological: Positive for light-headedness. Negative for dizziness, weakness and headaches.  Hematological: Negative for adenopathy. Does not bruise/bleed easily.  Psychiatric/Behavioral: Negative for sleep disturbance and decreased concentration.   Past Medical History  Diagnosis Date  . Insomnia due to medical condition   . Anxiety   . Hyperlipidemia   . Hypertension   . Hypothyroidism   . Low back pain   . Left wrist fracture 09/2007  . Fracture of right shoulder 02/2008  . GERD (gastroesophageal reflux disease)   . Barrett's esophagus   . Hemorrhoids, external   . CAD (coronary artery disease) of artery bypass graft 1995  . Hiatal hernia   . History of colon polyps     History   Social History  . Marital Status: Widowed    Spouse Name: N/A    Number of Children: N/A  . Years of Education: N/A   Occupational History  . Not on file.   Social History Main Topics  . Smoking status: Former Smoker    Quit date: 04/20/1968  . Smokeless tobacco: Never Used     Comment: QUIT IN 1970  . Alcohol Use: No     Comment:  STOPPED IN 1970  . Drug Use: No  . Sexual Activity: Not Currently   Other Topics Concern  . Not on file   Social History Narrative  . No narrative on file    Past Surgical History  Procedure Laterality Date  . Colonoscopy w/ polypectomy  06/2005  . Carotid endarterectomy    . Cataract extraction      Family History  Problem Relation Age of Onset  . Diabetes Son   . Heart disease Mother   . Heart disease Father     Allergies  Allergen Reactions  . Codeine   . Meperidine Hcl   . Oxycodone-Acetaminophen     Current Outpatient Prescriptions on File Prior to Visit  Medication Sig Dispense Refill  . acetaminophen (TYLENOL) 500 MG tablet Take by mouth every 6 (six) hours as needed. For pain      . aspirin 81 MG tablet Take 81 mg by mouth daily.        . bisoprolol (ZEBETA) 5 MG tablet Take 1.5 tablets (7.5 mg total) by mouth daily.  140 tablet  3  . Calcium Carbonate-Vitamin D (CALCIUM 600-D) 600-400 MG-UNIT per tablet Take 1 tablet by mouth 2 (two) times daily.        . clonazePAM (KLONOPIN) 1 MG tablet Take a half tablet when you awaken in the morning take a half tablet at 6:00 PM and take a whole tablet right before that  60 tablet  1  . cyanocobalamin 500 MCG  tablet Take 500 mcg by mouth daily.        Marland Kitchen diltiazem (CARDIZEM CD) 120 MG 24 hr capsule Take 1 capsule (120 mg total) by mouth daily.  30 capsule  1  . levothyroxine (SYNTHROID, LEVOTHROID) 88 MCG tablet Take 1 tablet (88 mcg total) by mouth daily.  30 tablet  1  . LIPITOR 80 MG tablet TAKE 1 TABLET BY MOUTH DAILY  90 tablet  1  . losartan (COZAAR) 100 MG tablet Take 1 tablet (100 mg total) by mouth daily.  90 tablet  3  . mirtazapine (REMERON) 45 MG tablet TAKE 1 TABLET AT BEDTIME  90 tablet  3  . Multiple Vitamin (MULTIVITAMIN) capsule Take 1 capsule by mouth daily.        Marland Kitchen omeprazole (PRILOSEC) 20 MG capsule Take 1 capsule (20 mg total) by mouth daily.  90 capsule  3  . QUEtiapine (SEROQUEL) 50 MG tablet TAKE 1  TABLET BY MOUTH AT BEDTIME  90 tablet  1  . Rivaroxaban (XARELTO) 20 MG TABS Take 1 tablet (20 mg total) by mouth daily.  14 tablet  1  . vitamin E 400 UNIT capsule Take 400 Units by mouth daily.        . traMADol (ULTRAM) 50 MG tablet Take 1 tablet (50 mg total) by mouth every 6 (six) hours as needed for pain.  30 tablet  0   No current facility-administered medications on file prior to visit.    BP 106/64  Pulse 72  Temp(Src) 98.2 F (36.8 C)  Resp 16  Ht 5\' 4"  (1.626 m)  Wt 132 lb (59.875 kg)  BMI 22.65 kg/m2       Objective:   Physical Exam  Nursing note reviewed. Constitutional: She is oriented to person, place, and time. She appears well-developed and well-nourished.  HENT:  Head: Normocephalic and atraumatic.  Eyes: Conjunctivae are normal. Pupils are equal, round, and reactive to light.  Neck: Normal range of motion. Neck supple.  Cardiovascular: Normal rate.   Murmur heard. Abdominal: Soft.  Musculoskeletal: She exhibits edema and tenderness.  Neurological: She is oriented to person, place, and time.  Skin: Skin is warm and dry.  Psychiatric: She has a normal mood and affect. Her behavior is normal.          Assessment & Plan:  Tramadol for back pain Constipated and dependant of ducolax  Will try amitiza for constipation due to pain medications  Stable HTN Stable lipids

## 2012-12-02 NOTE — Patient Instructions (Signed)
Use the tramadol as needed 3 times a day for back pain

## 2012-12-08 DIAGNOSIS — H2700 Aphakia, unspecified eye: Secondary | ICD-10-CM | POA: Diagnosis not present

## 2012-12-08 DIAGNOSIS — Z961 Presence of intraocular lens: Secondary | ICD-10-CM | POA: Diagnosis not present

## 2012-12-14 ENCOUNTER — Other Ambulatory Visit: Payer: Self-pay | Admitting: Internal Medicine

## 2012-12-23 ENCOUNTER — Telehealth: Payer: Self-pay | Admitting: Internal Medicine

## 2012-12-23 MED ORDER — LUBIPROSTONE 24 MCG PO CAPS: 24.0000 ug | ORAL_CAPSULE | Freq: Two times a day (BID) | ORAL | Status: DC

## 2012-12-23 NOTE — Telephone Encounter (Signed)
Pt was given samples a AMITIZA 24 mg to see if this worked. Pt would lioke a new RX for this drug. irt works for constipation  Do not see on med list Pharm: [facility]

## 2012-12-23 NOTE — Telephone Encounter (Signed)
rx sent in electronically 

## 2012-12-25 ENCOUNTER — Other Ambulatory Visit: Payer: Self-pay | Admitting: Internal Medicine

## 2013-01-06 ENCOUNTER — Encounter: Payer: Self-pay | Admitting: Cardiology

## 2013-01-06 ENCOUNTER — Ambulatory Visit (INDEPENDENT_AMBULATORY_CARE_PROVIDER_SITE_OTHER): Payer: Medicare Other | Admitting: Cardiology

## 2013-01-06 VITALS — BP 130/70 | HR 63 | Ht 64.0 in | Wt 132.1 lb

## 2013-01-06 DIAGNOSIS — Z7901 Long term (current) use of anticoagulants: Secondary | ICD-10-CM | POA: Diagnosis not present

## 2013-01-06 DIAGNOSIS — I10 Essential (primary) hypertension: Secondary | ICD-10-CM

## 2013-01-06 DIAGNOSIS — Z9181 History of falling: Secondary | ICD-10-CM

## 2013-01-06 DIAGNOSIS — I2510 Atherosclerotic heart disease of native coronary artery without angina pectoris: Secondary | ICD-10-CM

## 2013-01-06 DIAGNOSIS — I480 Paroxysmal atrial fibrillation: Secondary | ICD-10-CM

## 2013-01-06 DIAGNOSIS — I4891 Unspecified atrial fibrillation: Secondary | ICD-10-CM

## 2013-01-06 DIAGNOSIS — I482 Chronic atrial fibrillation, unspecified: Secondary | ICD-10-CM | POA: Insufficient documentation

## 2013-01-06 LAB — BASIC METABOLIC PANEL
BUN: 16 mg/dL (ref 6–23)
CO2: 29 mEq/L (ref 19–32)
Calcium: 8.9 mg/dL (ref 8.4–10.5)
Chloride: 100 mEq/L (ref 96–112)
Creat: 1.15 mg/dL — ABNORMAL HIGH (ref 0.50–1.10)
Glucose, Bld: 86 mg/dL (ref 70–99)
Potassium: 4.3 mEq/L (ref 3.5–5.3)
Sodium: 135 mEq/L (ref 135–145)

## 2013-01-06 MED ORDER — BISOPROLOL FUMARATE 5 MG PO TABS: 5.0000 mg | ORAL_TABLET | Freq: Every day | ORAL | Status: DC

## 2013-01-06 NOTE — Progress Notes (Signed)
01/06/2013 Jasmine [name]   03-10-1923  [id number]  Primary Physicia Carrie Mew, MD Primary Cardiologist: Dr Allyson Sabal  HPI:  Jasmine [name] is a delightful 79 y/o female we have followed for years. Her husband, Jasmine [name], was a long time pt of our as well. Jasmine [name] has a history of CAD. She is S/P CABG in '97. She had a cath in Jan 2005 that revealed patent grafts (LIMA-LAD, SVG-RCA, SVG-DX, and SVG-OM). She had a low risk Myoview in Dec 2012. Echo 9/12 showed Nl LVF. She developed AF with RVR in the fall of 2012. Dr Allyson Sabal placed her on Xarelto then and by November 2012 she had converted to NSR.           She is here today for a 6 month office visit. Since we saw her last she has been doing OK. Her daughter accompanied her today to the office. Her daughter does says she has had some issues with falling. She has fallen twice in the last six months. Jasmine [name] says she just "blacks out" but the pt's daughter thinks she gets in a hurry and trips. She does not think it's from orthostatic B/P changes. Jasmine [name] denies any chest pain or palpitations. She has had no serious injuries with her falls but on one occasion she had a black eye from her glasses. She is in NSR today in the office with a rate of 60.   Current Outpatient Prescriptions  Medication Sig Dispense Refill  . acetaminophen (TYLENOL) 500 MG tablet Take by mouth every 6 (six) hours as needed. For pain      . aspirin 81 MG tablet Take 81 mg by mouth daily.        . Calcium Carbonate-Vitamin D (CALCIUM 600-D) 600-400 MG-UNIT per tablet Take 1 tablet by mouth 2 (two) times daily.        . clonazePAM (KLONOPIN) 1 MG tablet Take a half tablet when you awaken in the morning take a half tablet at 6:00 PM and take a whole tablet right before that  60 tablet  1  . cyanocobalamin 500 MCG tablet Take 500 mcg by mouth daily.        Marland Kitchen diltiazem (CARDIZEM CD) 120 MG 24 hr capsule Take 1 capsule (120 mg total) by mouth daily.  30 capsule  1  .  levothyroxine (SYNTHROID, LEVOTHROID) 88 MCG tablet Take 1 tablet (88 mcg total) by mouth daily.  30 tablet  1  . LIPITOR 80 MG tablet TAKE 1 TABLET BY MOUTH DAILY  90 tablet  1  . losartan (COZAAR) 100 MG tablet Take 1 tablet (100 mg total) by mouth daily.  90 tablet  3  . lubiprostone (AMITIZA) 24 MCG capsule Take 1 capsule (24 mcg total) by mouth 2 (two) times daily with a meal.  180 capsule  3  . mirtazapine (REMERON) 45 MG tablet TAKE 1 TABLET AT BEDTIME  90 tablet  3  . Multiple Vitamin (MULTIVITAMIN) capsule Take 1 capsule by mouth daily.        Marland Kitchen omeprazole (PRILOSEC) 20 MG capsule Take 1 capsule (20 mg total) by mouth daily.  90 capsule  3  . QUEtiapine (SEROQUEL) 50 MG tablet TAKE 1 TABLET AT BEDTIME  90 tablet  1  . Rivaroxaban (XARELTO) 20 MG TABS Take 1 tablet (20 mg total) by mouth daily.  14 tablet  1  . traMADol (ULTRAM) 50 MG tablet Take 1 tablet (50 mg total) by mouth every 8 (eight)  hours as needed for pain.  150 tablet  3  . vitamin E 400 UNIT capsule Take 400 Units by mouth daily.        . bisoprolol (ZEBETA) 5 MG tablet Take 1 tablet (5 mg total) by mouth daily.      . traMADol (ULTRAM) 50 MG tablet Take 1 tablet (50 mg total) by mouth every 6 (six) hours as needed for pain.  30 tablet  0   No current facility-administered medications for this visit.    Allergies  Allergen Reactions  . Codeine   . Meperidine Hcl   . Oxycodone-Acetaminophen     History   Social History  . Marital Status: Widowed    Spouse Name: N/A    Number of Children: N/A  . Years of Education: N/A   Occupational History  . Not on file.   Social History Main Topics  . Smoking status: Former Smoker    Quit date: 04/20/1968  . Smokeless tobacco: Never Used     Comment: QUIT IN 1970  . Alcohol Use: No     Comment: STOPPED IN 1970  . Drug Use: No  . Sexual Activity: Not Currently   Other Topics Concern  . Not on file   Social History Narrative  . No narrative on file     Review  of Systems: General: negative for chills, fever, night sweats or weight changes.  Cardiovascular: negative for chest pain, dyspnea on exertion, edema, orthopnea, palpitations, paroxysmal nocturnal dyspnea or shortness of breath Dermatological: negative for rash Respiratory: negative for cough or wheezing Urologic: negative for hematuria Abdominal: negative for nausea, vomiting, diarrhea, bright red blood per rectum, melena, or hematemesis positive for constipation Neurologic: negative for visual changes, syncope, or dizziness All other systems reviewed and are otherwise negative except as noted above.    Blood pressure 130/70, pulse 63, height 5\' 4"  (1.626 m), weight 132 lb 1.6 oz (59.92 kg).  General appearance: alert, cooperative, appears stated age and no distress Lungs: kyphosis, basilar crackles Heart: regular rate and rhythm Extremities: no edema  EKG NSR, septal Qs, TWI V4-V5 (old)  ASSESSMENT AND PLAN:   At risk for falling Larey Seat twice in last 6 months  CAD - CABG '97, cath OK 2005, Myoview low risk 12/12 No angina  HTN (hypertension) B/P controlled  PAF (paroxysmal atrial fibrillation) Now SB  Chronic anticoagulation Xarelto   PLAN  I cut Jasmine [name]'s Zebeta back to 5 mg daily. I will discuss Xarelto Rx with Dr Allyson Sabal. We will see her back in 6 months. I did order a BMP today, her last K+ was 5.3 and she is on Cozaar 100 mg.  [facility] 01/06/2013 3:42 PM

## 2013-01-06 NOTE — Assessment & Plan Note (Signed)
Now SB

## 2013-01-06 NOTE — Assessment & Plan Note (Signed)
Xarelto

## 2013-01-06 NOTE — Patient Instructions (Signed)
Your physician recommends that you schedule a follow-up appointment in: 6 months with Dr Allyson Sabal Decrease Zebeta to 5 mg daily Your physician recommends that you return for lab work today

## 2013-01-06 NOTE — Assessment & Plan Note (Signed)
No angina 

## 2013-01-06 NOTE — Assessment & Plan Note (Signed)
B/P controlled 

## 2013-01-06 NOTE — Assessment & Plan Note (Signed)
Fell twice in last 6 months

## 2013-01-09 ENCOUNTER — Encounter: Payer: Self-pay | Admitting: Cardiology

## 2013-01-10 ENCOUNTER — Encounter: Payer: Self-pay | Admitting: Cardiovascular Disease

## 2013-01-10 ENCOUNTER — Other Ambulatory Visit: Payer: Self-pay | Admitting: Internal Medicine

## 2013-01-11 ENCOUNTER — Telehealth: Payer: Self-pay | Admitting: *Deleted

## 2013-01-11 NOTE — Telephone Encounter (Signed)
I spoke with Jasmine [name] and her daughter and explained the risk of continued falling while on the blood thinner.  I also spoke about the risk of stopping the blood thinner.  Patient and daughter decided to agree with Dr Allyson Sabal and stop the blood thinner. (xarelto)

## 2013-01-11 NOTE — Telephone Encounter (Signed)
Message copied by Marella Bile on Wed Jan 11, 2013  2:08 PM ------      Message from: Runell Gess      Created: Wed Jan 11, 2013  1:13 PM      Regarding: discontinue Xarelto       2 recent episodes of falling and the patient's age I decided to stop her oral anticoagulation. ------

## 2013-01-12 ENCOUNTER — Encounter: Payer: Self-pay | Admitting: Cardiology

## 2013-01-20 ENCOUNTER — Other Ambulatory Visit: Payer: Self-pay | Admitting: Internal Medicine

## 2013-02-17 ENCOUNTER — Other Ambulatory Visit: Payer: Self-pay | Admitting: *Deleted

## 2013-02-17 ENCOUNTER — Telehealth: Payer: Self-pay | Admitting: Internal Medicine

## 2013-02-17 DIAGNOSIS — F419 Anxiety disorder, unspecified: Secondary | ICD-10-CM

## 2013-02-17 MED ORDER — ATORVASTATIN CALCIUM 80 MG PO TABS: ORAL_TABLET | ORAL | Status: DC

## 2013-02-17 MED ORDER — CLONAZEPAM 1 MG PO TABS: ORAL_TABLET | ORAL | Status: DC

## 2013-02-17 NOTE — Telephone Encounter (Signed)
faxed

## 2013-02-17 NOTE — Telephone Encounter (Signed)
Pt request refill  clonazePAM (KLONOPIN) 1 MG tablet  1/ 2 am 1/2 @6pm  and 1 @ bedtime atorvastatin (LIPITOR) 80 MG tablet  1 /day [facility]

## 2013-03-09 ENCOUNTER — Other Ambulatory Visit: Payer: Self-pay | Admitting: Cardiovascular Disease

## 2013-03-09 NOTE — Telephone Encounter (Signed)
Rx was sent to pharmacy electronically. 

## 2013-05-01 ENCOUNTER — Encounter: Payer: Self-pay | Admitting: Internal Medicine

## 2013-05-01 ENCOUNTER — Ambulatory Visit (INDEPENDENT_AMBULATORY_CARE_PROVIDER_SITE_OTHER): Payer: Medicare Other | Admitting: Internal Medicine

## 2013-05-01 VITALS — BP 124/70 | HR 72 | Temp 97.7°F | Resp 16 | Ht 64.0 in | Wt 127.0 lb

## 2013-05-01 DIAGNOSIS — I10 Essential (primary) hypertension: Secondary | ICD-10-CM | POA: Diagnosis not present

## 2013-05-01 DIAGNOSIS — Z9181 History of falling: Secondary | ICD-10-CM

## 2013-05-01 DIAGNOSIS — R296 Repeated falls: Secondary | ICD-10-CM

## 2013-05-01 DIAGNOSIS — I4891 Unspecified atrial fibrillation: Secondary | ICD-10-CM | POA: Diagnosis not present

## 2013-05-01 NOTE — Progress Notes (Signed)
   Subjective:    Patient ID: Jasmine [name], female    DOB: [birth year], 80 y.o.   MRN: [id number]  HPI Patient has been challenged with her balance and had multiple falls therefore the anticoagulant therapy was discontinued due to high risk.  She does have atrial fibrillation hypertension and hypothyroidism.  She has lost weight again and this most probably do to loss of appetite.     Review of Systems  Constitutional: Negative for activity change, appetite change and fatigue.  HENT: Negative for congestion, ear pain, postnasal drip and sinus pressure.   Eyes: Negative for redness and visual disturbance.  Respiratory: Negative for cough, shortness of breath and wheezing.   Gastrointestinal: Negative for abdominal pain and abdominal distention.  Genitourinary: Negative for dysuria, frequency and menstrual problem.  Musculoskeletal: Negative for arthralgias, joint swelling, myalgias and neck pain.  Skin: Negative for rash and wound.  Neurological: Negative for dizziness, weakness and headaches.  Hematological: Negative for adenopathy. Does not bruise/bleed easily.  Psychiatric/Behavioral: Negative for sleep disturbance and decreased concentration.  high fall risk Needs PT     Objective:   Physical Exam  Nursing note and vitals reviewed. Constitutional: She appears well-developed and well-nourished. No distress.  HENT:  Head: Normocephalic and atraumatic.  Right Ear: External ear normal.  Left Ear: External ear normal.  Nose: Nose normal.  Mouth/Throat: Oropharynx is clear and moist.  Eyes: Conjunctivae and EOM are normal. Pupils are equal, round, and reactive to light.  Neck: Normal range of motion. Neck supple. No JVD present. No tracheal deviation present. No thyromegaly present.  Cardiovascular:  No murmur heard. Irregular rate and rhythm   Pulmonary/Chest: Effort normal and breath sounds normal. She has no wheezes. She exhibits no tenderness.  Abdominal: Soft. Bowel  sounds are normal.  Musculoskeletal: Normal range of motion. She exhibits no edema and no tenderness.  Lymphadenopathy:    She has no cervical adenopathy.  Neurological: She displays abnormal reflex. No cranial nerve deficit. She exhibits abnormal muscle tone. Coordination abnormal.  Skin: Skin is warm and dry. She is not diaphoretic.  Psychiatric: She has a normal mood and affect. Her behavior is normal.          Assessment & Plan:  Stable drug related and slow transit constipation Stable AF and HTN weight loss and FTT due to depression and diet High fall risk due to balance issues Refer to PT for gate and transfer training

## 2013-05-01 NOTE — Progress Notes (Signed)
[facility] not available today  

## 2013-05-09 DIAGNOSIS — R269 Unspecified abnormalities of gait and mobility: Secondary | ICD-10-CM | POA: Diagnosis not present

## 2013-05-11 DIAGNOSIS — R269 Unspecified abnormalities of gait and mobility: Secondary | ICD-10-CM | POA: Diagnosis not present

## 2013-05-16 DIAGNOSIS — R269 Unspecified abnormalities of gait and mobility: Secondary | ICD-10-CM | POA: Diagnosis not present

## 2013-05-19 ENCOUNTER — Other Ambulatory Visit: Payer: Self-pay | Admitting: Internal Medicine

## 2013-05-19 DIAGNOSIS — R269 Unspecified abnormalities of gait and mobility: Secondary | ICD-10-CM | POA: Diagnosis not present

## 2013-05-23 ENCOUNTER — Telehealth: Payer: Self-pay | Admitting: Internal Medicine

## 2013-05-23 DIAGNOSIS — R269 Unspecified abnormalities of gait and mobility: Secondary | ICD-10-CM | POA: Diagnosis not present

## 2013-05-23 NOTE — Telephone Encounter (Signed)
Relevant patient education mailed to patient.  

## 2013-05-26 DIAGNOSIS — R269 Unspecified abnormalities of gait and mobility: Secondary | ICD-10-CM | POA: Diagnosis not present

## 2013-05-31 DIAGNOSIS — R269 Unspecified abnormalities of gait and mobility: Secondary | ICD-10-CM | POA: Diagnosis not present

## 2013-06-02 ENCOUNTER — Other Ambulatory Visit: Payer: Self-pay | Admitting: *Deleted

## 2013-06-02 ENCOUNTER — Telehealth: Payer: Self-pay | Admitting: Internal Medicine

## 2013-06-02 DIAGNOSIS — F419 Anxiety disorder, unspecified: Secondary | ICD-10-CM

## 2013-06-02 DIAGNOSIS — R269 Unspecified abnormalities of gait and mobility: Secondary | ICD-10-CM | POA: Diagnosis not present

## 2013-06-02 MED ORDER — CLONAZEPAM 1 MG PO TABS: ORAL_TABLET | ORAL | Status: DC

## 2013-06-02 NOTE — Telephone Encounter (Signed)
Pt's daughter calling requesting rx clonazePAM (KLONOPIN) 1 MG tablet for her mother, sent to [facility]. Please call to advise when available for pick up.

## 2013-06-02 NOTE — Telephone Encounter (Signed)
Called in.

## 2013-06-07 ENCOUNTER — Telehealth: Payer: Self-pay | Admitting: Pharmacist Clinician (PhC)/ Clinical Pharmacy Specialist

## 2013-06-07 NOTE — Telephone Encounter (Signed)
Received request from [facility] that diltiazem 120mg  (24hr) is currently out of stock/unavailable.  LMOM for pt dtr to call about how much they have on hand.    Dtr returned call, states they still have about 1 month supply at home.  I asked her to call us when down to 2 weeks, first check with pharmacy to see if available yet.  If not, we will try local pharmacies to refill until backorder can be resolved at [facility].    Dtr voiced understanding.

## 2013-06-12 ENCOUNTER — Other Ambulatory Visit: Payer: Self-pay | Admitting: Internal Medicine

## 2013-06-13 DIAGNOSIS — R269 Unspecified abnormalities of gait and mobility: Secondary | ICD-10-CM | POA: Diagnosis not present

## 2013-06-16 DIAGNOSIS — R269 Unspecified abnormalities of gait and mobility: Secondary | ICD-10-CM | POA: Diagnosis not present

## 2013-06-20 DIAGNOSIS — R269 Unspecified abnormalities of gait and mobility: Secondary | ICD-10-CM | POA: Diagnosis not present

## 2013-06-23 DIAGNOSIS — R269 Unspecified abnormalities of gait and mobility: Secondary | ICD-10-CM | POA: Diagnosis not present

## 2013-06-30 DIAGNOSIS — R269 Unspecified abnormalities of gait and mobility: Secondary | ICD-10-CM | POA: Diagnosis not present

## 2013-07-02 ENCOUNTER — Other Ambulatory Visit: Payer: Self-pay | Admitting: Internal Medicine

## 2013-07-04 DIAGNOSIS — R269 Unspecified abnormalities of gait and mobility: Secondary | ICD-10-CM | POA: Diagnosis not present

## 2013-07-07 DIAGNOSIS — R269 Unspecified abnormalities of gait and mobility: Secondary | ICD-10-CM | POA: Diagnosis not present

## 2013-07-11 ENCOUNTER — Ambulatory Visit: Payer: Medicare Other | Admitting: Cardiovascular Disease

## 2013-07-11 DIAGNOSIS — R269 Unspecified abnormalities of gait and mobility: Secondary | ICD-10-CM | POA: Diagnosis not present

## 2013-07-14 DIAGNOSIS — R269 Unspecified abnormalities of gait and mobility: Secondary | ICD-10-CM | POA: Diagnosis not present

## 2013-07-18 DIAGNOSIS — R269 Unspecified abnormalities of gait and mobility: Secondary | ICD-10-CM | POA: Diagnosis not present

## 2013-07-21 DIAGNOSIS — R269 Unspecified abnormalities of gait and mobility: Secondary | ICD-10-CM | POA: Diagnosis not present

## 2013-07-25 DIAGNOSIS — R269 Unspecified abnormalities of gait and mobility: Secondary | ICD-10-CM | POA: Diagnosis not present

## 2013-07-28 DIAGNOSIS — R269 Unspecified abnormalities of gait and mobility: Secondary | ICD-10-CM | POA: Diagnosis not present

## 2013-07-30 ENCOUNTER — Other Ambulatory Visit: Payer: Self-pay | Admitting: Internal Medicine

## 2013-08-01 DIAGNOSIS — R269 Unspecified abnormalities of gait and mobility: Secondary | ICD-10-CM | POA: Diagnosis not present

## 2013-08-02 ENCOUNTER — Ambulatory Visit (INDEPENDENT_AMBULATORY_CARE_PROVIDER_SITE_OTHER): Payer: Medicare Other | Admitting: Cardiovascular Disease

## 2013-08-02 ENCOUNTER — Encounter: Payer: Self-pay | Admitting: Cardiovascular Disease

## 2013-08-02 VITALS — BP 124/60 | HR 63 | Ht 64.0 in | Wt 121.9 lb

## 2013-08-02 DIAGNOSIS — E785 Hyperlipidemia, unspecified: Secondary | ICD-10-CM

## 2013-08-02 DIAGNOSIS — I2510 Atherosclerotic heart disease of native coronary artery without angina pectoris: Secondary | ICD-10-CM | POA: Diagnosis not present

## 2013-08-02 DIAGNOSIS — I10 Essential (primary) hypertension: Secondary | ICD-10-CM

## 2013-08-02 DIAGNOSIS — I480 Paroxysmal atrial fibrillation: Secondary | ICD-10-CM

## 2013-08-02 DIAGNOSIS — I4891 Unspecified atrial fibrillation: Secondary | ICD-10-CM

## 2013-08-02 DIAGNOSIS — Z79899 Other long term (current) drug therapy: Secondary | ICD-10-CM

## 2013-08-02 NOTE — Assessment & Plan Note (Signed)
Maintaining sinus rhythm. Xarelto Oral anticoagulation was stopped approximately 6 months ago because of fall risk

## 2013-08-02 NOTE — Progress Notes (Signed)
08/02/2013 Jasmine [name] A Patron   1922/08/17  [id number]  Primary Physician Carrie MewJENKINS,JOHN EDWARD, MD Primary Cardiologist: Runell GessJonathan J. Doxie Augenstein MD Roseanne RenoFACP,FACC,FAHA, FSCAI   HPI:  The patient is an 80 year old, thin and frail-appearing, widowed Caucasian female, mother of 3, grandmother to 6512 grandchildren who is accompanied by one of her daughters today. I saw her 6 months ago. She has a history of CAD and PVOD. She had coronary artery bypass grafting in 1997 as well as carotid endarterectomy at that time. I catheterized her December 09, 2003, revealing patent grafts and normal LV function. Her other problems include hypertension and hyperlipidemia. She developed A-fib with RVR and we have been adjusting her medications. I put her on Xarelto for stroke prophylaxis and she spontaneously converted to sinus rhythm. Her last stress test performed April 03, 2011, was unremarkable. She is otherwise asymptomatic except for dizziness.she saw [name] back 6 months ago. The Xarelto was discontinued because of fall risk. She is maintaining sinus rhythm.    Current Outpatient Prescriptions  Medication Sig Dispense Refill  . acetaminophen (TYLENOL) 500 MG tablet Take by mouth every 6 (six) hours as needed. For pain      . aspirin 81 MG tablet Take 81 mg by mouth daily.        Marland Kitchen. atorvastatin (LIPITOR) 80 MG tablet TAKE 1 TABLET BY MOUTH DAILY  90 tablet  3  . bisoprolol (ZEBETA) 5 MG tablet Take 1 tablet (5 mg total) by mouth daily.      . Calcium Carbonate-Vitamin D (CALCIUM 600-D) 600-400 MG-UNIT per tablet Take 1 tablet by mouth 2 (two) times daily.        . clonazePAM (KLONOPIN) 1 MG tablet Take a half tablet when you awaken in the morning take a half tablet at 6:00 PM and take a whole tablet  at bedtime  180 tablet  1  . cyanocobalamin 500 MCG tablet Take 500 mcg by mouth daily.        Marland Kitchen. diltiazem (CARDIZEM CD) 120 MG 24 hr capsule Take 1 capsule (120 mg total) by mouth daily.  30 capsule  1  .  levothyroxine (SYNTHROID, LEVOTHROID) 88 MCG tablet TAKE 1 TABLET DAILY  90 tablet  2  . losartan (COZAAR) 100 MG tablet TAKE 1 TABLET DAILY  90 tablet  2  . lubiprostone (AMITIZA) 24 MCG capsule Take 1 capsule (24 mcg total) by mouth 2 (two) times daily with a meal.  180 capsule  3  . mirtazapine (REMERON) 45 MG tablet TAKE 1 TABLET AT BEDTIME  90 tablet  3  . Multiple Vitamin (MULTIVITAMIN) capsule Take 1 capsule by mouth daily.        Marland Kitchen. omeprazole (PRILOSEC) 20 MG capsule TAKE 1 CAPSULE DAILY  90 capsule  3  . QUEtiapine (SEROQUEL) 50 MG tablet TAKE 1 TABLET AT BEDTIME  90 tablet  0  . TAZTIA XT 120 MG 24 hr capsule TAKE 1 CAPSULE DAILY  90 capsule  2  . traMADol (ULTRAM) 50 MG tablet Take 1 tablet (50 mg total) by mouth every 8 (eight) hours as needed for pain.  150 tablet  3  . vitamin E 400 UNIT capsule Take 400 Units by mouth daily.        . traMADol (ULTRAM) 50 MG tablet Take 1 tablet (50 mg total) by mouth every 6 (six) hours as needed for pain.  30 tablet  0   No current facility-administered medications for this visit.  Allergies  Allergen Reactions  . Codeine   . Meperidine Hcl   . Other     Aquatab  . Oxycodone-Acetaminophen     History   Social History  . Marital Status: Widowed    Spouse Name: N/A    Number of Children: N/A  . Years of Education: N/A   Occupational History  . Not on file.   Social History Main Topics  . Smoking status: Former Smoker    Quit date: 04/20/1968  . Smokeless tobacco: Never Used     Comment: QUIT IN 1970  . Alcohol Use: No     Comment: STOPPED IN 1970  . Drug Use: No  . Sexual Activity: Not Currently   Other Topics Concern  . Not on file   Social History Narrative  . No narrative on file     Review of Systems: General: negative for chills, fever, night sweats or weight changes.  Cardiovascular: negative for chest pain, dyspnea on exertion, edema, orthopnea, palpitations, paroxysmal nocturnal dyspnea or shortness of  breath Dermatological: negative for rash Respiratory: negative for cough or wheezing Urologic: negative for hematuria Abdominal: negative for nausea, vomiting, diarrhea, bright red blood per rectum, melena, or hematemesis Neurologic: negative for visual changes, syncope, or dizziness All other systems reviewed and are otherwise negative except as noted above.    Blood pressure 124/60, pulse 63, height 5\' 4"  (1.626 m), weight 121 lb 14.4 oz (55.293 kg).  General appearance: alert and no distress Neck: no adenopathy, no carotid bruit, no JVD, supple, symmetrical, trachea midline and thyroid not enlarged, symmetric, no tenderness/mass/nodules Lungs: clear to auscultation bilaterally Heart: regular rate and rhythm, S1, S2 normal, no murmur, click, rub or gallop Extremities: extremities normal, atraumatic, no cyanosis or edema  EKG normal sinus rhythm at 63 without ST or T wave changes  ASSESSMENT AND PLAN:   HYPERTENSION Controlled on current medications  PAF (paroxysmal atrial fibrillation) Maintaining sinus rhythm. Xarelto Oral anticoagulation was stopped approximately 6 months ago because of fall risk  CAD - CABG '97, cath OK 2005, Myoview low risk 12/12 Status post coronary artery bypass grafting in 1997. I catheterized her 12/09/03 revealing patent grafts and normal LV function. Her last Myoview performed 04/03/11 was nonischemic.      Runell GessJonathan J. Joniah Bednarski MD FACP,FACC,FAHA, [facility] 08/02/2013 2:22 PM

## 2013-08-02 NOTE — Assessment & Plan Note (Signed)
Status post coronary artery bypass grafting in 1997. I catheterized her 12/09/03 revealing patent grafts and normal LV function. Her last Myoview performed 04/03/11 was nonischemic.

## 2013-08-02 NOTE — Assessment & Plan Note (Signed)
Controlled on current medications 

## 2013-08-02 NOTE — Patient Instructions (Signed)
We request that you follow-up in: 6 months with [name] and in 1 year with Dr [name]  You will receive a reminder letter in the mail two months in advance. If you don't receive a letter, please call our office to schedule the follow-up appointment.   Your physician recommends that you return for a FASTING lipid profile:

## 2013-08-04 LAB — LIPID PANEL
CHOLESTEROL: 137 mg/dL (ref 0–200)
HDL: 48 mg/dL (ref >39)
LDL Cholesterol: 72 mg/dL (ref 0–99)
Total CHOL/HDL Ratio: 2.9 Ratio
Triglycerides: 84 mg/dL (ref <150)
VLDL: 17 mg/dL (ref 0–40)

## 2013-08-04 LAB — HEPATIC FUNCTION PANEL
ALBUMIN: 3.8 g/dL (ref 3.5–5.2)
ALT: 16 U/L (ref 0–35)
AST: 30 U/L (ref 0–37)
Alkaline Phosphatase: 92 U/L (ref 39–117)
Bilirubin, Direct: 0.2 mg/dL (ref 0.0–0.3)
Indirect Bilirubin: 0.4 mg/dL (ref 0.2–1.2)
Total Bilirubin: 0.6 mg/dL (ref 0.2–1.2)
Total Protein: 6.1 g/dL (ref 6.0–8.3)

## 2013-08-07 DIAGNOSIS — R269 Unspecified abnormalities of gait and mobility: Secondary | ICD-10-CM | POA: Diagnosis not present

## 2013-08-31 ENCOUNTER — Other Ambulatory Visit: Payer: Self-pay | Admitting: Internal Medicine

## 2013-10-04 DIAGNOSIS — S058X9A Other injuries of unspecified eye and orbit, initial encounter: Secondary | ICD-10-CM | POA: Diagnosis not present

## 2013-10-10 ENCOUNTER — Other Ambulatory Visit: Payer: Self-pay | Admitting: Internal Medicine

## 2013-11-02 ENCOUNTER — Other Ambulatory Visit: Payer: Self-pay | Admitting: Internal Medicine

## 2013-11-03 ENCOUNTER — Ambulatory Visit (INDEPENDENT_AMBULATORY_CARE_PROVIDER_SITE_OTHER): Payer: Medicare Other | Admitting: Internal Medicine

## 2013-11-03 ENCOUNTER — Encounter: Payer: Self-pay | Admitting: Internal Medicine

## 2013-11-03 VITALS — BP 124/72 | HR 65 | Temp 97.4°F | Ht 64.0 in | Wt 120.0 lb

## 2013-11-03 DIAGNOSIS — K5900 Constipation, unspecified: Secondary | ICD-10-CM | POA: Diagnosis not present

## 2013-11-03 DIAGNOSIS — I2510 Atherosclerotic heart disease of native coronary artery without angina pectoris: Secondary | ICD-10-CM

## 2013-11-03 DIAGNOSIS — I10 Essential (primary) hypertension: Secondary | ICD-10-CM | POA: Diagnosis not present

## 2013-11-03 MED ORDER — LOSARTAN POTASSIUM 100 MG PO TABS: ORAL_TABLET | ORAL | Status: DC

## 2013-11-03 MED ORDER — ATORVASTATIN CALCIUM 80 MG PO TABS: ORAL_TABLET | ORAL | Status: DC

## 2013-11-03 NOTE — Progress Notes (Signed)
Pre visit review using our clinic review tool, if applicable. No additional management support is needed unless otherwise documented below in the visit note. 

## 2013-11-03 NOTE — Progress Notes (Signed)
Subjective:    Patient ID: Jasmine DakinEmma A [name], female    DOB: [birth year], 80 y.o.   MRN: [id number]  HPI  PATIENT wit chronic anxiety, CAD, lipid and HTN with PAF Endocrine issues include hypothyroid  Needs to establish at [facility]  Stable today with better control of constipations. Needs to keep diet and fluid intake regular ( eat and drink three times a day)  Review of Systems  Constitutional: Positive for activity change, appetite change and fatigue.  HENT: Negative for congestion, ear pain, postnasal drip and sinus pressure.   Eyes: Negative for redness and visual disturbance.  Respiratory: Positive for shortness of breath. Negative for cough and wheezing.   Cardiovascular: Positive for palpitations.  Gastrointestinal: Negative for abdominal pain and abdominal distention.  Genitourinary: Positive for frequency. Negative for dysuria and menstrual problem.  Musculoskeletal: Negative for arthralgias, joint swelling, myalgias and neck pain.  Skin: Negative for rash and wound.  Neurological: Negative for dizziness, weakness and headaches.  Hematological: Negative for adenopathy. Does not bruise/bleed easily.  Psychiatric/Behavioral: Negative for sleep disturbance and decreased concentration.   Past Medical History  Diagnosis Date  . Insomnia due to medical condition   . Anxiety   . Hyperlipidemia   . Hypertension   . Hypothyroidism   . Low back pain   . Left wrist fracture 09/2007  . Fracture of right shoulder 02/2008  . GERD (gastroesophageal reflux disease)   . Barrett's esophagus   . Hemorrhoids, external   . CAD (coronary artery disease) of artery bypass graft 1995  . Hiatal hernia   . History of colon polyps   . Atrial fibrillation with RVR     History   Social History  . Marital Status: Widowed    Spouse Name: N/A    Number of Children: N/A  . Years of Education: N/A   Occupational History  . Not on file.   Social History Main Topics  . Smoking  status: Former Smoker    Quit date: 04/20/1968  . Smokeless tobacco: Never Used     Comment: QUIT IN 1970  . Alcohol Use: No     Comment: STOPPED IN 1970  . Drug Use: No  . Sexual Activity: Not Currently   Other Topics Concern  . Not on file   Social History Narrative  . No narrative on file    Past Surgical History  Procedure Laterality Date  . Colonoscopy w/ polypectomy  06/2005  . Carotid endarterectomy    . Cataract extraction    . Carotid doppler Bilateral 05/15/2010    Right Bulb/CEA-demonstrated trace irregular nonhemodynamically significant plaque 0-49%. Left Bulb/Proximal ICA-demonstrated irregular nonhemodynamically signifcant plaque 0-49%.  . Cardiac catheterization  02/25/1996    No intervention-recommend CABG. Left main-80% concentric distal stenosis, LAD-99% ostial stenosis, first diag-80% segmental proximal stenosis, Circumflex-80% ostial stenosis, RCA-90% ostial stenosis.  . Cardiac catheterization  05/21/2003    No intervention-widely patent grafts.  . Coronary artery bypass graft  1997  . Lexiscan myoview  04/03/2011    No scintigraphic evidence of inducible myocardial ischemia. No lexiscan EKG changes. Non-diagnostic for ischemia.   . Transthoracic echocardiogram  01/08/2011    EF 50-55%, LA moderately dilated, moderate tricuspid regurg, mild pulmonary hypertension.    Family History  Problem Relation Age of Onset  . Diabetes Son   . Heart disease Mother   . Heart disease Father     Allergies  Allergen Reactions  . Codeine   . Meperidine Hcl   .  Other     Aquatab  . Oxycodone-Acetaminophen     Current Outpatient Prescriptions on File Prior to Visit  Medication Sig Dispense Refill  . acetaminophen (TYLENOL) 500 MG tablet Take by mouth every 6 (six) hours as needed. For pain      . aspirin 81 MG tablet Take 81 mg by mouth daily.        . bisoprolol (ZEBETA) 5 MG tablet Take 1 tablet (5 mg total) by mouth daily.      . Calcium Carbonate-Vitamin D  (CALCIUM 600-D) 600-400 MG-UNIT per tablet Take 1 tablet by mouth 2 (two) times daily.        . clonazePAM (KLONOPIN) 1 MG tablet Take a half tablet when you awaken in the morning take a half tablet at 6:00 PM and take a whole tablet  at bedtime  180 tablet  1  . cyanocobalamin 500 MCG tablet Take 500 mcg by mouth daily.        Marland Kitchen diltiazem (CARDIZEM CD) 120 MG 24 hr capsule Take 1 capsule (120 mg total) by mouth daily.  30 capsule  1  . levothyroxine (SYNTHROID, LEVOTHROID) 88 MCG tablet TAKE 1 TABLET DAILY  90 tablet  2  . lubiprostone (AMITIZA) 24 MCG capsule Take 1 capsule (24 mcg total) by mouth 2 (two) times daily with a meal.  180 capsule  3  . mirtazapine (REMERON) 45 MG tablet TAKE 1 TABLET AT BEDTIME  90 tablet  3  . Multiple Vitamin (MULTIVITAMIN) capsule Take 1 capsule by mouth daily.        Marland Kitchen omeprazole (PRILOSEC) 20 MG capsule TAKE 1 CAPSULE DAILY  90 capsule  3  . QUEtiapine (SEROQUEL) 50 MG tablet TAKE 1 TABLET AT BEDTIME  90 tablet  0  . TAZTIA XT 120 MG 24 hr capsule TAKE 1 CAPSULE DAILY  90 capsule  2  . traMADol (ULTRAM) 50 MG tablet Take 1 tablet (50 mg total) by mouth every 8 (eight) hours as needed for pain.  150 tablet  3  . vitamin E 400 UNIT capsule Take 400 Units by mouth daily.        . traMADol (ULTRAM) 50 MG tablet Take 1 tablet (50 mg total) by mouth every 6 (six) hours as needed for pain.  30 tablet  0   No current facility-administered medications on file prior to visit.    BP 124/72  Pulse 65  Temp(Src) 97.4 F (36.3 C) (Oral)  Ht 5\' 4"  (1.626 m)  Wt 120 lb (54.432 kg)  BMI 20.59 kg/m2        Objective:   Physical Exam  Constitutional: She is oriented to person, place, and time. She appears well-developed and well-nourished.  HENT:  Head: Normocephalic and atraumatic.  Eyes: Conjunctivae are normal.  Cardiovascular: Regular rhythm.   Murmur heard. Abdominal: Soft. There is tenderness.  Neurological: She is alert and oriented to person, place,  and time.  Psychiatric: She has a normal mood and affect. Her behavior is normal.          Assessment & Plan:  Stable CV with hx of CABG greater that 10 years Stable thyroid Constipation Weight loss and nutritions issues BMI  20  But has gradual weight loss  Refer to Dr Dayton Martes

## 2013-11-03 NOTE — Patient Instructions (Signed)
The patient is instructed to continue all medications as prescribed. Schedule followup with check out clerk upon leaving the clinic  

## 2013-11-11 ENCOUNTER — Other Ambulatory Visit: Payer: Self-pay | Admitting: Cardiovascular Disease

## 2013-11-14 NOTE — Telephone Encounter (Signed)
Rx refill sent to patient pharmacy   

## 2013-11-29 ENCOUNTER — Telehealth: Payer: Self-pay | Admitting: Internal Medicine

## 2013-11-29 ENCOUNTER — Telehealth: Payer: Self-pay | Admitting: Family Medicine

## 2013-11-29 MED ORDER — CLONAZEPAM 1 MG PO TABS: ORAL_TABLET | ORAL | Status: DC

## 2013-11-29 MED ORDER — MIRTAZAPINE 45 MG PO TABS: ORAL_TABLET | ORAL | Status: DC

## 2013-11-29 NOTE — Telephone Encounter (Signed)
Thanks. Pt scheduled 02/28/14. [name] (pt daughter) aware.

## 2013-11-29 NOTE — Telephone Encounter (Signed)
Ok per Dr Lovell SheehanJenkins x5, rx's called in

## 2013-11-29 NOTE — Telephone Encounter (Signed)
Pt daughter called requesting you become her mothers PCP. Will be a transfer pt from Dr. Lovell SheehanJenkins. His patients are supposed to go in next new pt slot and since she is medicare your next appt is not until April. She needs medications re-newed now and will need to be seen in November per [name] (pt daughter). I explained to [name] she needs to call [facility] for medication refills until we can see if she can be seen earlier than April. When I mentioned to [name] about the 11 am slots she states all of her mothers appts will need to be after 12 b/c she doesn't get up until then so the appt will need to be in the afternoon. Would you be willing to accommodate Jasmine [name] for an earlier appt?

## 2013-11-29 NOTE — Telephone Encounter (Signed)
Pt's daughter states she spoke w/ someone about refill of the following meds. They received everyting else but not these.  clonazePAM (KLONOPIN) 1 MG tablet  # [facility] [address] mirtazapine (REMERON) 45 MG tablet pls #90 [facility] [facility] [address]

## 2013-11-29 NOTE — Telephone Encounter (Signed)
Yes ok to accommodate earlier.  Will make Adrienne aware.

## 2013-12-09 ENCOUNTER — Other Ambulatory Visit: Payer: Self-pay | Admitting: Internal Medicine

## 2013-12-21 ENCOUNTER — Other Ambulatory Visit: Payer: Self-pay | Admitting: Internal Medicine

## 2013-12-27 DIAGNOSIS — H43399 Other vitreous opacities, unspecified eye: Secondary | ICD-10-CM | POA: Diagnosis not present

## 2013-12-27 DIAGNOSIS — H52229 Regular astigmatism, unspecified eye: Secondary | ICD-10-CM | POA: Diagnosis not present

## 2013-12-27 DIAGNOSIS — H5200 Hypermetropia, unspecified eye: Secondary | ICD-10-CM | POA: Diagnosis not present

## 2013-12-27 DIAGNOSIS — H353190 Nonexudative age-related macular degeneration, unspecified eye, stage unspecified: Secondary | ICD-10-CM | POA: Diagnosis not present

## 2013-12-27 DIAGNOSIS — Z961 Presence of intraocular lens: Secondary | ICD-10-CM | POA: Diagnosis not present

## 2014-01-15 ENCOUNTER — Encounter: Payer: Self-pay | Admitting: Cardiology

## 2014-01-15 ENCOUNTER — Ambulatory Visit (INDEPENDENT_AMBULATORY_CARE_PROVIDER_SITE_OTHER): Payer: Medicare Other | Admitting: Cardiology

## 2014-01-15 VITALS — BP 120/62 | HR 61 | Ht 66.0 in | Wt 117.8 lb

## 2014-01-15 DIAGNOSIS — I480 Paroxysmal atrial fibrillation: Secondary | ICD-10-CM

## 2014-01-15 DIAGNOSIS — I2510 Atherosclerotic heart disease of native coronary artery without angina pectoris: Secondary | ICD-10-CM

## 2014-01-15 DIAGNOSIS — I4891 Unspecified atrial fibrillation: Secondary | ICD-10-CM

## 2014-01-15 DIAGNOSIS — E039 Hypothyroidism, unspecified: Secondary | ICD-10-CM

## 2014-01-15 NOTE — Assessment & Plan Note (Signed)
Holding NSR 

## 2014-01-15 NOTE — Assessment & Plan Note (Signed)
No angina 

## 2014-01-15 NOTE — Patient Instructions (Signed)
Lab work today  ( bmet,cbc,tsh )   Your physician wants you to follow-up in: 6 months with Dr.Berry. You will receive a reminder letter in the mail two months in advance. If you don't receive a letter, please call our office to schedule the follow-up appointment.

## 2014-01-15 NOTE — Assessment & Plan Note (Signed)
Controlled.  

## 2014-01-15 NOTE — Assessment & Plan Note (Signed)
Lipids LFTs check this spring

## 2014-01-15 NOTE — Progress Notes (Signed)
01/15/2014 Jasmine [name]   Mar 22, 1923  [id number]  Primary Physicia Carrie Mew, MD Primary Cardiologist: Dr Allyson Sabal  HPI:  80 y/o female, well known to [place], who is accompanied by one of her daughters today. She has a history of CAD and PVOD. She had coronary artery bypass grafting in 1997 as well as carotid endarterectomy at that time. She had a cath  December 09, 2003, revealing patent grafts and normal LV function. Her last stress test performed April 03, 2011, was unremarkable. Her other problems include hypertension and hyperlipidemia. She has had PAF in the past but has been holding NSR.  She has a history of following and we stopped her Xarelto a year ago.          She is here for her 6 month check. She has been doing well from a cardiac standpoint. She is in good spirits. Her daughter says she fell once since her LOV and that episode she lost her balance. Her daughter says her mother has lost 15 lbs over the past year. She no longer has a PCP.     Current Outpatient Prescriptions  Medication Sig Dispense Refill  . acetaminophen (TYLENOL) 500 MG tablet Take by mouth every 6 (six) hours as needed. For pain      . aspirin 81 MG tablet Take 81 mg by mouth daily.        Marland Kitchen atorvastatin (LIPITOR) 80 MG tablet TAKE 1 TABLET BY MOUTH DAILY  90 tablet  3  . bisoprolol (ZEBETA) 5 MG tablet Take 1 tablet (5 mg total) by mouth once daily.      . Calcium Carbonate-Vitamin D (CALCIUM 600-D) 600-400 MG-UNIT per tablet Take 1 tablet by mouth 2 (two) times daily.        . clonazePAM (KLONOPIN) 1 MG tablet Take a half tablet when you awaken in the morning take a half tablet at 6:00 PM and take a whole tablet  at bedtime  180 tablet  1  . cyanocobalamin 500 MCG tablet Take 500 mcg by mouth daily.        Marland Kitchen diltiazem (TIAZAC) 120 MG 24 hr capsule TAKE 1 CAPSULE DAILY  90 capsule  2  . levothyroxine (SYNTHROID, LEVOTHROID) 88 MCG tablet TAKE 1 TABLET DAILY  90 tablet  2  . losartan (COZAAR) 100 MG  tablet TAKE 1 TABLET DAILY  90 tablet  3  . lubiprostone (AMITIZA) 24 MCG capsule Take 1 capsule (24 mcg total) by mouth 2 (two) times daily with a meal.  180 capsule  3  . mirtazapine (REMERON) 45 MG tablet TAKE 1 TABLET AT BEDTIME  90 tablet  3  . Multiple Vitamin (MULTIVITAMIN) capsule Take 1 capsule by mouth daily.        Marland Kitchen omeprazole (PRILOSEC) 20 MG capsule TAKE 1 CAPSULE DAILY  90 capsule  3  . QUEtiapine (SEROQUEL) 50 MG tablet TAKE 1 TABLET AT BEDTIME  90 tablet  2  . traMADol (ULTRAM) 50 MG tablet Take 1 tablet (50 mg total) by mouth every 8 (eight) hours as needed for pain.  150 tablet  3  . vitamin E 400 UNIT capsule Take 400 Units by mouth daily.         No current facility-administered medications for this visit.    Allergies  Allergen Reactions  . Codeine   . Meperidine Hcl   . Other     Aquatab  . Oxycodone-Acetaminophen     History   Social History  .  Marital Status: Widowed    Spouse Name: N/A    Number of Children: N/A  . Years of Education: N/A   Occupational History  . Not on file.   Social History Main Topics  . Smoking status: Former Smoker    Quit date: 04/20/1968  . Smokeless tobacco: Never Used     Comment: QUIT IN 1970  . Alcohol Use: No     Comment: STOPPED IN 1970  . Drug Use: No  . Sexual Activity: Not Currently   Other Topics Concern  . Not on file   Social History Narrative  . No narrative on file     Review of Systems: General: negative for chills, fever, night sweats or weight changes.  Cardiovascular: negative for chest pain, dyspnea on exertion, edema, orthopnea, palpitations, paroxysmal nocturnal dyspnea or shortness of breath Dermatological: negative for rash Respiratory: negative for cough or wheezing Urologic: negative for hematuria Abdominal: negative for nausea, vomiting, diarrhea, bright red blood per rectum, melena, or hematemesis Neurologic: negative for visual changes, syncope, or dizziness All other systems  reviewed and are otherwise negative except as noted above.    Blood pressure 120/62, pulse 61, height  (1.676 m), weight 117 lb 12.8 oz (53.434 kg).  General appearance: alert, cooperative, appears stated age, no distress and kyphotic Neck: no carotid bruit and no JVD Lungs: clear to auscultation bilaterally and kyphosis Heart: regular rate and rhythm  EKG NSR   ASSESSMENT AND PLAN:   CAD - CABG '97, cath OK 2005, Myoview low risk 12/12 No angina  Chronic anticoagulation Stopped Sept 2014 secondary to falls  PAF (paroxysmal atrial fibrillation) Holding NSR  HTN (hypertension) Controlled  HYPERLIPIDEMIA Lipids LFTs check this spring  HYPOTHYROIDISM .    PLAN  I ordered labs today- CBC, BMP, TSH- these haven't been checked in more than a year. She can follow up with Dr Allyson Sabal in 6 months.   Doy Taaffe KPA-C 01/15/2014 3:24 PM

## 2014-01-15 NOTE — Assessment & Plan Note (Signed)
Stopped Sept 2014 secondary to falls

## 2014-01-16 LAB — CBC WITH DIFFERENTIAL/PLATELET
Basophils Absolute: 0 10*3/uL (ref 0.0–0.1)
Basophils Relative: 0 % (ref 0–1)
Eosinophils Absolute: 0.1 10*3/uL (ref 0.0–0.7)
Eosinophils Relative: 2 % (ref 0–5)
HCT: 41.4 % (ref 36.0–46.0)
Hemoglobin: 13.4 g/dL (ref 12.0–15.0)
Lymphocytes Relative: 45 % (ref 12–46)
Lymphs Abs: 1.7 10*3/uL (ref 0.7–4.0)
MCH: 30.2 pg (ref 26.0–34.0)
MCHC: 32.4 g/dL (ref 30.0–36.0)
MCV: 93.2 fL (ref 78.0–100.0)
Monocytes Absolute: 0.3 10*3/uL (ref 0.1–1.0)
Monocytes Relative: 7 % (ref 3–12)
Neutro Abs: 1.7 10*3/uL (ref 1.7–7.7)
Neutrophils Relative %: 46 % (ref 43–77)
Platelets: 109 10*3/uL — ABNORMAL LOW (ref 150–400)
RBC: 4.44 MIL/uL (ref 3.87–5.11)
RDW: 13.8 % (ref 11.5–15.5)
WBC: 3.7 10*3/uL — ABNORMAL LOW (ref 4.0–10.5)

## 2014-01-16 LAB — BASIC METABOLIC PANEL
BUN: 11 mg/dL (ref 6–23)
CO2: 25 mEq/L (ref 19–32)
Calcium: 9.1 mg/dL (ref 8.4–10.5)
Chloride: 98 mEq/L (ref 96–112)
Creat: 1 mg/dL (ref 0.50–1.10)
Glucose, Bld: 66 mg/dL — ABNORMAL LOW (ref 70–99)
Potassium: 4.4 mEq/L (ref 3.5–5.3)
Sodium: 136 mEq/L (ref 135–145)

## 2014-01-16 LAB — TSH: TSH: 2.196 u[IU]/mL (ref 0.350–4.500)

## 2014-02-25 ENCOUNTER — Encounter (HOSPITAL_COMMUNITY): Payer: Self-pay | Admitting: Emergency Medicine

## 2014-02-25 ENCOUNTER — Emergency Department (HOSPITAL_COMMUNITY): Payer: Medicare Other

## 2014-02-25 ENCOUNTER — Inpatient Hospital Stay (HOSPITAL_COMMUNITY)
Admission: EM | Admit: 2014-02-25 | Discharge: 2014-02-27 | DRG: 312 | Disposition: A | Discharge status: Home Health | Payer: Medicare Other | Attending: Internal Medicine | Admitting: Internal Medicine

## 2014-02-25 DIAGNOSIS — R55 Syncope and collapse: Secondary | ICD-10-CM | POA: Insufficient documentation

## 2014-02-25 DIAGNOSIS — M545 Low back pain: Secondary | ICD-10-CM | POA: Diagnosis present

## 2014-02-25 DIAGNOSIS — S92335A Nondisplaced fracture of third metatarsal bone, left foot, initial encounter for closed fracture: Secondary | ICD-10-CM | POA: Diagnosis not present

## 2014-02-25 DIAGNOSIS — M47816 Spondylosis without myelopathy or radiculopathy, lumbar region: Secondary | ICD-10-CM | POA: Diagnosis not present

## 2014-02-25 DIAGNOSIS — M79672 Pain in left foot: Secondary | ICD-10-CM | POA: Diagnosis not present

## 2014-02-25 DIAGNOSIS — D696 Thrombocytopenia, unspecified: Secondary | ICD-10-CM | POA: Diagnosis present

## 2014-02-25 DIAGNOSIS — I951 Orthostatic hypotension: Secondary | ICD-10-CM | POA: Insufficient documentation

## 2014-02-25 DIAGNOSIS — I369 Nonrheumatic tricuspid valve disorder, unspecified: Secondary | ICD-10-CM | POA: Diagnosis not present

## 2014-02-25 DIAGNOSIS — Z833 Family history of diabetes mellitus: Secondary | ICD-10-CM

## 2014-02-25 DIAGNOSIS — S92342A Displaced fracture of fourth metatarsal bone, left foot, initial encounter for closed fracture: Secondary | ICD-10-CM | POA: Diagnosis present

## 2014-02-25 DIAGNOSIS — Z8249 Family history of ischemic heart disease and other diseases of the circulatory system: Secondary | ICD-10-CM

## 2014-02-25 DIAGNOSIS — F419 Anxiety disorder, unspecified: Secondary | ICD-10-CM | POA: Diagnosis present

## 2014-02-25 DIAGNOSIS — Z885 Allergy status to narcotic agent status: Secondary | ICD-10-CM | POA: Diagnosis not present

## 2014-02-25 DIAGNOSIS — Z888 Allergy status to other drugs, medicaments and biological substances status: Secondary | ICD-10-CM | POA: Diagnosis not present

## 2014-02-25 DIAGNOSIS — S92332A Displaced fracture of third metatarsal bone, left foot, initial encounter for closed fracture: Secondary | ICD-10-CM | POA: Diagnosis present

## 2014-02-25 DIAGNOSIS — E785 Hyperlipidemia, unspecified: Secondary | ICD-10-CM | POA: Diagnosis present

## 2014-02-25 DIAGNOSIS — W19XXXA Unspecified fall, initial encounter: Secondary | ICD-10-CM

## 2014-02-25 DIAGNOSIS — I10 Essential (primary) hypertension: Secondary | ICD-10-CM | POA: Diagnosis present

## 2014-02-25 DIAGNOSIS — S8252XA Displaced fracture of medial malleolus of left tibia, initial encounter for closed fracture: Secondary | ICD-10-CM | POA: Diagnosis present

## 2014-02-25 DIAGNOSIS — S92341A Displaced fracture of fourth metatarsal bone, right foot, initial encounter for closed fracture: Secondary | ICD-10-CM | POA: Diagnosis not present

## 2014-02-25 DIAGNOSIS — S92302A Fracture of unspecified metatarsal bone(s), left foot, initial encounter for closed fracture: Secondary | ICD-10-CM

## 2014-02-25 DIAGNOSIS — R51 Headache: Secondary | ICD-10-CM | POA: Diagnosis not present

## 2014-02-25 DIAGNOSIS — Z9181 History of falling: Secondary | ICD-10-CM

## 2014-02-25 DIAGNOSIS — Z66 Do not resuscitate: Secondary | ICD-10-CM | POA: Diagnosis present

## 2014-02-25 DIAGNOSIS — E039 Hypothyroidism, unspecified: Secondary | ICD-10-CM | POA: Diagnosis present

## 2014-02-25 DIAGNOSIS — I2510 Atherosclerotic heart disease of native coronary artery without angina pectoris: Secondary | ICD-10-CM | POA: Diagnosis present

## 2014-02-25 DIAGNOSIS — S0990XA Unspecified injury of head, initial encounter: Secondary | ICD-10-CM | POA: Diagnosis not present

## 2014-02-25 DIAGNOSIS — Z87891 Personal history of nicotine dependence: Secondary | ICD-10-CM

## 2014-02-25 DIAGNOSIS — M542 Cervicalgia: Secondary | ICD-10-CM | POA: Diagnosis not present

## 2014-02-25 DIAGNOSIS — S92331A Displaced fracture of third metatarsal bone, right foot, initial encounter for closed fracture: Secondary | ICD-10-CM | POA: Diagnosis not present

## 2014-02-25 DIAGNOSIS — M4854XA Collapsed vertebra, not elsewhere classified, thoracic region, initial encounter for fracture: Secondary | ICD-10-CM | POA: Diagnosis present

## 2014-02-25 DIAGNOSIS — N390 Urinary tract infection, site not specified: Secondary | ICD-10-CM | POA: Diagnosis present

## 2014-02-25 DIAGNOSIS — I480 Paroxysmal atrial fibrillation: Secondary | ICD-10-CM | POA: Diagnosis present

## 2014-02-25 DIAGNOSIS — K219 Gastro-esophageal reflux disease without esophagitis: Secondary | ICD-10-CM | POA: Diagnosis present

## 2014-02-25 DIAGNOSIS — S199XXA Unspecified injury of neck, initial encounter: Secondary | ICD-10-CM | POA: Diagnosis not present

## 2014-02-25 DIAGNOSIS — R404 Transient alteration of awareness: Secondary | ICD-10-CM | POA: Diagnosis not present

## 2014-02-25 DIAGNOSIS — M549 Dorsalgia, unspecified: Secondary | ICD-10-CM | POA: Diagnosis not present

## 2014-02-25 DIAGNOSIS — I482 Chronic atrial fibrillation, unspecified: Secondary | ICD-10-CM | POA: Diagnosis present

## 2014-02-25 DIAGNOSIS — Z7982 Long term (current) use of aspirin: Secondary | ICD-10-CM | POA: Diagnosis not present

## 2014-02-25 DIAGNOSIS — W1830XA Fall on same level, unspecified, initial encounter: Secondary | ICD-10-CM | POA: Diagnosis present

## 2014-02-25 LAB — CBC WITH DIFFERENTIAL/PLATELET
Basophils Absolute: 0 10*3/uL (ref 0.0–0.1)
Basophils Relative: 0 % (ref 0–1)
EOS ABS: 0 10*3/uL (ref 0.0–0.7)
EOS PCT: 0 % (ref 0–5)
HEMATOCRIT: 42.9 % (ref 36.0–46.0)
Hemoglobin: 13.8 g/dL (ref 12.0–15.0)
LYMPHS ABS: 0.7 10*3/uL (ref 0.7–4.0)
Lymphocytes Relative: 14 % (ref 12–46)
MCH: 29.7 pg (ref 26.0–34.0)
MCHC: 32.2 g/dL (ref 30.0–36.0)
MCV: 92.3 fL (ref 78.0–100.0)
MONO ABS: 0.4 10*3/uL (ref 0.1–1.0)
MONOS PCT: 8 % (ref 3–12)
Neutro Abs: 4.1 10*3/uL (ref 1.7–7.7)
Neutrophils Relative %: 78 % — ABNORMAL HIGH (ref 43–77)
Platelets: 119 10*3/uL — ABNORMAL LOW (ref 150–400)
RBC: 4.65 MIL/uL (ref 3.87–5.11)
RDW: 13.6 % (ref 11.5–15.5)
WBC: 5.2 10*3/uL (ref 4.0–10.5)

## 2014-02-25 LAB — COMPREHENSIVE METABOLIC PANEL
ALT: 21 U/L (ref 0–35)
AST: 28 U/L (ref 0–37)
Albumin: 3.6 g/dL (ref 3.5–5.2)
Alkaline Phosphatase: 113 U/L (ref 39–117)
Anion gap: 13 (ref 5–15)
BUN: 12 mg/dL (ref 6–23)
CALCIUM: 9.2 mg/dL (ref 8.4–10.5)
CO2: 27 meq/L (ref 19–32)
Chloride: 97 mEq/L (ref 96–112)
Creatinine, Ser: 1.09 mg/dL (ref 0.50–1.10)
GFR calc Af Amer: 51 mL/min — ABNORMAL LOW (ref >90)
GFR, EST NON AFRICAN AMERICAN: 44 mL/min — AB (ref >90)
Glucose, Bld: 136 mg/dL — ABNORMAL HIGH (ref 70–99)
Potassium: 4.2 mEq/L (ref 3.7–5.3)
SODIUM: 137 meq/L (ref 137–147)
Total Bilirubin: 0.7 mg/dL (ref 0.3–1.2)
Total Protein: 6.6 g/dL (ref 6.0–8.3)

## 2014-02-25 LAB — URINALYSIS, ROUTINE W REFLEX MICROSCOPIC
Bilirubin Urine: NEGATIVE
GLUCOSE, UA: NEGATIVE mg/dL
Ketones, ur: NEGATIVE mg/dL
Nitrite: POSITIVE — AB
PH: 8 (ref 5.0–8.0)
Protein, ur: NEGATIVE mg/dL
SPECIFIC GRAVITY, URINE: 1.014 (ref 1.005–1.030)
Urobilinogen, UA: 0.2 mg/dL (ref 0.0–1.0)

## 2014-02-25 LAB — URINE MICROSCOPIC-ADD ON

## 2014-02-25 LAB — PROTIME-INR
INR: 1.04 (ref 0.00–1.49)
PROTHROMBIN TIME: 13.7 s (ref 11.6–15.2)

## 2014-02-25 LAB — TROPONIN I: Troponin I: <0.3 ng/mL (ref <0.30)

## 2014-02-25 MED ORDER — BISOPROLOL FUMARATE 5 MG PO TABS
5.0000 mg | ORAL_TABLET | Freq: Every day | ORAL | Status: DC
  Administered 2014-02-26 – 2014-02-27 (×2): 5 mg via ORAL
  Filled 2014-02-25 (×2): qty 1

## 2014-02-25 MED ORDER — DILTIAZEM HCL ER BEADS 120 MG PO CP24
120.0000 mg | ORAL_CAPSULE | Freq: Every day | ORAL | Status: DC
  Administered 2014-02-26 – 2014-02-27 (×2): 120 mg via ORAL
  Filled 2014-02-25 (×2): qty 1

## 2014-02-25 MED ORDER — HEPARIN SODIUM (PORCINE) 5000 UNIT/ML IJ SOLN
5000.0000 [IU] | Freq: Three times a day (TID) | INTRAMUSCULAR | Status: DC
  Administered 2014-02-25 – 2014-02-27 (×5): 5000 [IU] via SUBCUTANEOUS
  Filled 2014-02-25 (×8): qty 1

## 2014-02-25 MED ORDER — SODIUM CHLORIDE 0.9 % IV SOLN
INTRAVENOUS | Status: DC
  Administered 2014-02-25: 23:00:00 via INTRAVENOUS

## 2014-02-25 MED ORDER — CLONAZEPAM 1 MG PO TABS
1.0000 mg | ORAL_TABLET | Freq: Every morning | ORAL | Status: DC
  Administered 2014-02-26: 1 mg via ORAL
  Filled 2014-02-25: qty 1

## 2014-02-25 MED ORDER — ONDANSETRON HCL 4 MG PO TABS: 4.0000 mg | ORAL_TABLET | Freq: Four times a day (QID) | ORAL | Status: DC | PRN

## 2014-02-25 MED ORDER — DEXTROSE 5 % IV SOLN
1.0000 g | INTRAVENOUS | Status: DC
  Administered 2014-02-26 – 2014-02-27 (×2): 1 g via INTRAVENOUS
  Filled 2014-02-25 (×3): qty 10

## 2014-02-25 MED ORDER — ACETAMINOPHEN 500 MG PO TABS
500.0000 mg | ORAL_TABLET | Freq: Four times a day (QID) | ORAL | Status: DC | PRN
Start: 2014-02-25 — End: 2014-02-26

## 2014-02-25 MED ORDER — ONDANSETRON HCL 4 MG/2ML IJ SOLN: 4.0000 mg | Freq: Four times a day (QID) | INTRAMUSCULAR | Status: DC | PRN

## 2014-02-25 MED ORDER — LEVOTHYROXINE SODIUM 88 MCG PO TABS
88.0000 ug | ORAL_TABLET | Freq: Every day | ORAL | Status: DC
  Administered 2014-02-26 – 2014-02-27 (×2): 88 ug via ORAL
  Filled 2014-02-25 (×3): qty 1

## 2014-02-25 MED ORDER — LUBIPROSTONE 24 MCG PO CAPS
24.0000 ug | ORAL_CAPSULE | Freq: Two times a day (BID) | ORAL | Status: DC
  Administered 2014-02-26 – 2014-02-27 (×3): 24 ug via ORAL
  Filled 2014-02-25 (×5): qty 1

## 2014-02-25 MED ORDER — SODIUM CHLORIDE 0.9 % IV BOLUS (SEPSIS)
500.0000 mL | Freq: Once | INTRAVENOUS | Status: AC
  Administered 2014-02-25: 500 mL via INTRAVENOUS

## 2014-02-25 MED ORDER — ATORVASTATIN CALCIUM 80 MG PO TABS
80.0000 mg | ORAL_TABLET | Freq: Every day | ORAL | Status: DC
  Administered 2014-02-26 – 2014-02-27 (×2): 80 mg via ORAL
  Filled 2014-02-25 (×2): qty 1

## 2014-02-25 MED ORDER — PANTOPRAZOLE SODIUM 40 MG PO TBEC
40.0000 mg | DELAYED_RELEASE_TABLET | Freq: Every day | ORAL | Status: DC
  Administered 2014-02-26: 40 mg via ORAL
  Filled 2014-02-25: qty 1

## 2014-02-25 MED ORDER — LOSARTAN POTASSIUM 50 MG PO TABS
100.0000 mg | ORAL_TABLET | Freq: Every day | ORAL | Status: DC
  Filled 2014-02-25: qty 2

## 2014-02-25 MED ORDER — FENTANYL CITRATE 0.05 MG/ML IJ SOLN
25.0000 ug | Freq: Once | INTRAMUSCULAR | Status: AC
  Administered 2014-02-25: 25 ug via INTRAVENOUS
  Filled 2014-02-25: qty 2

## 2014-02-25 MED ORDER — SODIUM CHLORIDE 0.9 % IJ SOLN
3.0000 mL | Freq: Two times a day (BID) | INTRAMUSCULAR | Status: DC
  Administered 2014-02-25 – 2014-02-27 (×3): 3 mL via INTRAVENOUS

## 2014-02-25 MED ORDER — MIRTAZAPINE 45 MG PO TABS
45.0000 mg | ORAL_TABLET | Freq: Every day | ORAL | Status: DC
  Administered 2014-02-25 – 2014-02-26 (×2): 45 mg via ORAL
  Filled 2014-02-25 (×3): qty 1

## 2014-02-25 MED ORDER — ACETAMINOPHEN 325 MG PO TABS
650.0000 mg | ORAL_TABLET | Freq: Four times a day (QID) | ORAL | Status: DC | PRN
  Administered 2014-02-25 – 2014-02-27 (×3): 650 mg via ORAL
  Filled 2014-02-25 (×3): qty 2

## 2014-02-25 MED ORDER — QUETIAPINE FUMARATE 50 MG PO TABS
50.0000 mg | ORAL_TABLET | Freq: Every day | ORAL | Status: DC
  Administered 2014-02-25 – 2014-02-26 (×2): 50 mg via ORAL
  Filled 2014-02-25 (×3): qty 1

## 2014-02-25 MED ORDER — ASPIRIN 81 MG PO CHEW
81.0000 mg | CHEWABLE_TABLET | Freq: Every day | ORAL | Status: DC
  Administered 2014-02-26 – 2014-02-27 (×2): 81 mg via ORAL
  Filled 2014-02-25 (×3): qty 1

## 2014-02-25 MED ORDER — TRAMADOL HCL 50 MG PO TABS
50.0000 mg | ORAL_TABLET | Freq: Three times a day (TID) | ORAL | Status: DC | PRN
  Administered 2014-02-26: 50 mg via ORAL
  Filled 2014-02-25: qty 1

## 2014-02-25 MED ORDER — ACETAMINOPHEN 650 MG RE SUPP: 650.0000 mg | Freq: Four times a day (QID) | RECTAL | Status: DC | PRN

## 2014-02-25 NOTE — ED Notes (Signed)
Transporting patient to new room assignment. 

## 2014-02-25 NOTE — ED Provider Notes (Signed)
CSN: [id number]     Arrival date & time 02/25/14  1857 History   First MD Initiated Contact with Patient 02/25/14 1901     Chief Complaint  Patient presents with  . Fall  . Back Pain  . Foot Pain     (Consider location/radiation/quality/duration/timing/severity/associated sxs/prior Treatment) HPI Comments: Patient presents with low back pain and left foot pain after a syncopal episode this morning. Patient states she was getting out of bed to go to the bathroom and next thing she now she was on the bathroom floor. Does not remember feeling dizzy or lightheaded. EMS was called and patient was found to be hypotensive in the 80s. She was not transported. This evening EMS was called again when patient noticed back pain and left foot pain when trying to ambulate. She denies any new falls. She denies any chest pain or shortness of breath. She denies any head or neck pain but has bruising to her left temple. Denies any focal weakness, numbness or tingling.  Patient is a 80 y.o. female presenting with back pain and lower extremity pain. The history is provided by the patient and the EMS personnel.  Back Pain Associated symptoms: no abdominal pain, no chest pain, no dysuria, no fever, no numbness and no weakness   Foot Pain Pertinent negatives include no chest pain, no abdominal pain and no shortness of breath.    Past Medical History  Diagnosis Date  . Insomnia due to medical condition   . Anxiety   . Hyperlipidemia   . Hypertension   . Hypothyroidism   . Low back pain   . Left wrist fracture 09/2007  . Fracture of right shoulder 02/2008  . GERD (gastroesophageal reflux disease)   . Barrett's esophagus   . Hemorrhoids, external   . CAD (coronary artery disease) of artery bypass graft 1995  . Hiatal hernia   . History of colon polyps   . Atrial fibrillation with RVR    Past Surgical History  Procedure Laterality Date  . Colonoscopy w/ polypectomy  06/2005  . Carotid endarterectomy     . Cataract extraction    . Carotid doppler Bilateral 05/15/2010    Right Bulb/CEA-demonstrated trace irregular nonhemodynamically significant plaque 0-49%. Left Bulb/Proximal ICA-demonstrated irregular nonhemodynamically signifcant plaque 0-49%.  . Cardiac catheterization  02/25/1996    No intervention-recommend CABG. Left main-80% concentric distal stenosis, LAD-99% ostial stenosis, first diag-80% segmental proximal stenosis, Circumflex-80% ostial stenosis, RCA-90% ostial stenosis.  . Cardiac catheterization  05/21/2003    No intervention-widely patent grafts.  . Coronary artery bypass graft  1997  . Lexiscan myoview  04/03/2011    No scintigraphic evidence of inducible myocardial ischemia. No lexiscan EKG changes. Non-diagnostic for ischemia.   . Transthoracic echocardiogram  01/08/2011    EF 50-55%, LA moderately dilated, moderate tricuspid regurg, mild pulmonary hypertension.   Family History  Problem Relation Age of Onset  . Diabetes Son   . Heart disease Mother   . Heart disease Father    History  Substance Use Topics  . Smoking status: Former Smoker    Quit date: 04/20/1968  . Smokeless tobacco: Never Used     Comment: QUIT IN 1970  . Alcohol Use: No     Comment: STOPPED IN 1970   OB History    No data available     Review of Systems  Constitutional: Negative for fever, activity change and appetite change.  Respiratory: Negative for cough, chest tightness and shortness of breath.   Cardiovascular:  Negative for chest pain.  Gastrointestinal: Negative for nausea, vomiting and abdominal pain.  Genitourinary: Negative for dysuria.  Musculoskeletal: Positive for myalgias, back pain, arthralgias and neck pain.  Skin: Negative for wound.  Neurological: Positive for syncope. Negative for dizziness, seizures, speech difficulty, weakness, light-headedness and numbness.  A complete 10 system review of systems was obtained and all systems are negative except as noted in the HPI and  PMH.      Allergies  Codeine; Meperidine hcl; and Oxycodone-acetaminophen  Home Medications   Prior to Admission medications   Medication Sig Start Date End Date Taking? Authorizing Provider  aspirin 81 MG tablet Take 81 mg by mouth daily.     Yes Historical Provider, MD  atorvastatin (LIPITOR) 80 MG tablet TAKE 1 TABLET BY MOUTH DAILY Patient taking differently: Take 80 mg by mouth daily. TAKE 1 TABLET BY MOUTH DAILY 11/03/13  Yes Stacie Glaze, MD  bisoprolol (ZEBETA) 5 MG tablet Take 1 tablet (5 mg total) by mouth once daily.   Yes Stacie Glaze, MD  Calcium Carbonate-Vitamin D (CALCIUM 600-D) 600-400 MG-UNIT per tablet Take 1 tablet by mouth daily.    Yes Historical Provider, MD  clonazePAM (KLONOPIN) 1 MG tablet Take a half tablet when you awaken in the morning take a half tablet at 6:00 PM and take a whole tablet  at bedtime Patient taking differently: Take 1 mg by mouth every morning. Take a half tablet when you awaken in the morning take a half tablet at 6:00 PM and take a whole tablet  at bedtime 11/29/13  Yes Stacie Glaze, MD  cyanocobalamin 500 MCG tablet Take 500 mcg by mouth daily.     Yes Historical Provider, MD  diltiazem (TIAZAC) 120 MG 24 hr capsule TAKE 1 CAPSULE DAILY   Yes Runell Gess, MD  levothyroxine (SYNTHROID, LEVOTHROID) 88 MCG tablet TAKE 1 TABLET DAILY   Yes Stacie Glaze, MD  losartan (COZAAR) 100 MG tablet TAKE 1 TABLET DAILY Patient taking differently: Take 100 mg by mouth daily. TAKE 1 TABLET DAILY 11/03/13  Yes Stacie Glaze, MD  lubiprostone (AMITIZA) 24 MCG capsule Take 1 capsule (24 mcg total) by mouth 2 (two) times daily with a meal. 12/23/12  Yes Stacie Glaze, MD  mirtazapine (REMERON) 45 MG tablet TAKE 1 TABLET AT BEDTIME Patient taking differently: Take 45 mg by mouth at bedtime. TAKE 1 TABLET AT BEDTIME 11/29/13  Yes Stacie Glaze, MD  Multiple Vitamin (MULTIVITAMIN) capsule Take 1 capsule by mouth daily.     Yes Historical Provider, MD   omeprazole (PRILOSEC) 20 MG capsule TAKE 1 CAPSULE DAILY 06/12/13  Yes Stacie Glaze, MD  QUEtiapine (SEROQUEL) 50 MG tablet TAKE 1 TABLET AT BEDTIME 12/26/13  Yes Stacie Glaze, MD  traMADol (ULTRAM) 50 MG tablet Take 1 tablet (50 mg total) by mouth every 8 (eight) hours as needed for pain. 12/02/12  Yes Stacie Glaze, MD  vitamin E 400 UNIT capsule Take 400 Units by mouth daily.     Yes Historical Provider, MD  acetaminophen (TYLENOL) 500 MG tablet Take 500 mg by mouth every 6 (six) hours as needed for mild pain. For pain    Historical Provider, MD   BP 113/81 mmHg  Pulse 83  Temp(Src) 98.4 F (36.9 C) (Oral)  Resp 18  Ht 5\' 4"  (1.626 m)  Wt 117 lb 8 oz (53.298 kg)  BMI 20.16 kg/m2  SpO2 98% Physical Exam  Constitutional: She  is oriented to person, place, and time. She appears well-developed and well-nourished. No distress.  HENT:  Head: Normocephalic and atraumatic.  Mouth/Throat: Oropharynx is clear and moist. No oropharyngeal exudate.  Eyes: Conjunctivae and EOM are normal. Pupils are equal, round, and reactive to light.  Neck: Normal range of motion. Neck supple.  Diffuse paraspinal C spine tenderness  Cardiovascular: Normal rate, normal heart sounds and intact distal pulses.   No murmur heard. Irregular rhythm  Pulmonary/Chest: Effort normal and breath sounds normal. No respiratory distress. She exhibits no tenderness.  Abdominal: Soft. There is no tenderness. There is no rebound and no guarding.  Musculoskeletal: Normal range of motion. She exhibits tenderness. She exhibits no edema.  TTP lumbar spine, no stepoff TTP diffusely L foot.  Intact DP and PT pulses. No deformity  Neurological: She is alert and oriented to person, place, and time. No cranial nerve deficit. She exhibits normal muscle tone. Coordination normal.  No ataxia on finger to nose bilaterally. No pronator drift. 5/5 strength throughout. CN 2-12 intact. Negative Romberg. Equal grip strength. Sensation intact.  Gait is normal.   Skin: Skin is warm.  Psychiatric: She has a normal mood and affect. Her behavior is normal.  Nursing note and vitals reviewed.   ED Course  Procedures (including critical care time) Labs Review Labs Reviewed  COMPREHENSIVE METABOLIC PANEL - Abnormal; Notable for the following:    Glucose, Bld 136 (*)    GFR calc non Af Amer 44 (*)    GFR calc Af Amer 51 (*)    All other components within normal limits  CBC WITH DIFFERENTIAL - Abnormal; Notable for the following:    Platelets 119 (*)    Neutrophils Relative % 78 (*)    All other components within normal limits  URINALYSIS, ROUTINE W REFLEX MICROSCOPIC - Abnormal; Notable for the following:    APPearance TURBID (*)    Hgb urine dipstick SMALL (*)    Nitrite POSITIVE (*)    Leukocytes, UA LARGE (*)    All other components within normal limits  URINE MICROSCOPIC-ADD ON - Abnormal; Notable for the following:    Bacteria, UA MANY (*)    All other components within normal limits  PROTIME-INR  TROPONIN I  COMPREHENSIVE METABOLIC PANEL  CBC  PROTIME-INR  TROPONIN I    Imaging Review Dg Lumbar Spine Complete  02/25/2014   CLINICAL DATA:  Per [facility], pt stood up from sitting down and passed out, pt called ems this morning, they did a 12 lead this morning and it was fine, was hypotensive in the 80's. Pt tried to get up and walk around this evening and c/o left foot pain and lumbar back pain. Pt passed SCCA by ems. BP by ems now 144/90, 72 HR, 18 RR, 97% room air. Pt is AAOX4, in NAD.  EXAM: LUMBAR SPINE - COMPLETE 4+ VIEW  COMPARISON:  None.  FINDINGS: Mild a moderate curvature convex to the right. There is moderate diffuse decreased bone mineralization. Vertebral body alignment and heights are within normal, although the degree of osteopenia makes evaluation of the vertebral body heights on the lateral film somewhat difficult. There is mild a moderate spondylosis throughout the lumbar spine. Mild disc space narrowing at  the L5-S1 level. Moderate calcified plaque over the abdominal aorta and iliac arteries. There are mild degenerative changes of the hips.  IMPRESSION: No acute findings.  Mild a moderate spondylosis.  Mild disc disease at the L5-S1 level.   Electronically Signed  By: Elberta Fortis M.D.   On: 02/25/2014 20:07   Dg Ankle Complete Left  02/25/2014   CLINICAL DATA:  Left foot pain after a syncopal episode today when standing up from sitting down.  EXAM: LEFT ANKLE COMPLETE - 3+ VIEW  COMPARISON:  None.  FINDINGS: Diffuse osteopenia. Moderate sized inferior calcaneal spur. Atheromatous arterial calcifications. Possible nondisplaced fracture in the distal medial malleolus.  IMPRESSION: Possible nondisplaced fracture in the distal medial malleolus.   Electronically Signed   By: Gordan Payment M.D.   On: 02/25/2014 20:01   Ct Head Wo Contrast  02/25/2014   CLINICAL DATA:  Fall with back pain and foot pain. Headache and neck pain. Initial encounter  EXAM: CT HEAD WITHOUT CONTRAST  CT CERVICAL SPINE WITHOUT CONTRAST  TECHNIQUE: Multidetector CT imaging of the head and cervical spine was performed following the standard protocol without intravenous contrast. Multiplanar CT image reconstructions of the cervical spine were also generated.  COMPARISON:  05/28/2005  FINDINGS: CT HEAD FINDINGS  Skull and Sinuses:Negative for fracture or destructive process. The mastoids, middle ears, and imaged paranasal sinuses are clear.  Orbits: No acute abnormality.  Brain: No evidence of acute abnormality, such as acute infarction, hemorrhage, hydrocephalus, or mass lesion/mass effect. There is generalized brain atrophy which is progressed from 2007. Mild chronic still vessel disease with ischemic gliosis noted in the left external capsules and around the lateral ventricles. Coarse calcification again noted in the left parietal region.  CT CERVICAL SPINE FINDINGS  There is a compression deformity at the T1 vertebral body which results in  mild height loss (~30%). The fracture is new from 2007. No acute fracture line or surrounding fat infiltration is visible, but the fracture is still age indeterminate. Concavity of the T2 superior endplate which is also new from 2007. Smooth appearance favors remote injury. There is no gross cervical canal hematoma or prevertebral edema.  Diffuse spondylosis which is fairly typical of age. There is no evidence of high-grade canal stenosis.  IMPRESSION: 1. No acute intracranial injury. 2. Age indeterminate T1 compression fracture with mild height loss. 3. Generalized brain atrophy, progressed since 2007.   Electronically Signed   By: Tiburcio Pea M.D.   On: 02/25/2014 19:59   Ct Cervical Spine Wo Contrast  02/25/2014   CLINICAL DATA:  Fall with back pain and foot pain. Headache and neck pain. Initial encounter  EXAM: CT HEAD WITHOUT CONTRAST  CT CERVICAL SPINE WITHOUT CONTRAST  TECHNIQUE: Multidetector CT imaging of the head and cervical spine was performed following the standard protocol without intravenous contrast. Multiplanar CT image reconstructions of the cervical spine were also generated.  COMPARISON:  05/28/2005  FINDINGS: CT HEAD FINDINGS  Skull and Sinuses:Negative for fracture or destructive process. The mastoids, middle ears, and imaged paranasal sinuses are clear.  Orbits: No acute abnormality.  Brain: No evidence of acute abnormality, such as acute infarction, hemorrhage, hydrocephalus, or mass lesion/mass effect. There is generalized brain atrophy which is progressed from 2007. Mild chronic still vessel disease with ischemic gliosis noted in the left external capsules and around the lateral ventricles. Coarse calcification again noted in the left parietal region.  CT CERVICAL SPINE FINDINGS  There is a compression deformity at the T1 vertebral body which results in mild height loss (~30%). The fracture is new from 2007. No acute fracture line or surrounding fat infiltration is visible, but the  fracture is still age indeterminate. Concavity of the T2 superior endplate which is also new from 2007.  Smooth appearance favors remote injury. There is no gross cervical canal hematoma or prevertebral edema.  Diffuse spondylosis which is fairly typical of age. There is no evidence of high-grade canal stenosis.  IMPRESSION: 1. No acute intracranial injury. 2. Age indeterminate T1 compression fracture with mild height loss. 3. Generalized brain atrophy, progressed since 2007.   Electronically Signed   By: Tiburcio Pea M.D.   On: 02/25/2014 19:59   US Aorta  02/25/2014   CLINICAL DATA:  Back pain. Syncope. Evaluate for abdominal aortic aneurysm.  EXAM: ULTRASOUND OF ABDOMINAL AORTA  TECHNIQUE: Ultrasound examination of the abdominal aorta was performed to evaluate for abdominal aortic aneurysm.  COMPARISON:  05/22/2003  FINDINGS: Abdominal Aorta  No aneurysm identified.  Atherosclerotic changes.  Maximum AP  Diameter:  2.1  Maximum TRV  Diameter: 2.1  IMPRESSION: No abdominal aortic aneurysm.  Moderate atherosclerotic changes.   Electronically Signed   By: Loralie Champagne M.D.   On: 02/25/2014 20:24   Dg Foot Complete Left  02/25/2014   CLINICAL DATA:  Fall with back and foot pain.  Initial encounter  EXAM: LEFT FOOT - COMPLETE 3+ VIEW  COMPARISON:  None.  FINDINGS: Transverse, minimally displaced fractures of the third and fourth metatarsal necks. No joint malalignment.  Advanced diffuse osteopenia. Mild hallux valgus with first MTP osteoarthritis and small bony bunion. Chronic absence of the fifth proximal interphalangeal joint, likely postoperative.  IMPRESSION: Third and fourth metatarsal neck fractures.   Electronically Signed   By: Tiburcio Pea M.D.   On: 02/25/2014 20:02     EKG Interpretation   Date/Time:  Sunday February 25 2014 19:08:22 EST Ventricular Rate:  100 PR Interval:    QRS Duration: 85 QT Interval:  541 QTC Calculation: 698 R Axis:   -20 Text Interpretation:  Atrial  fibrillation Borderline left axis deviation  Low voltage, precordial leads Anteroseptal infarct, old Prolonged QT  interval Confirmed by Manus Gunning  MD, Betty Daidone [phone]) on 02/25/2014 7:19:39 PM      MDM   Final diagnoses:  Fall  Syncope  Metatarsal fracture, left, closed, initial encounter  Medial malleolar fracture, left, closed, initial encounter  syncopal episode without prodrome. Patient has atrial fibrillation with history of same. No anticoagulant use. No chest pain or shortness of breath.  With syncope and back pain, well check for AAA.  CT head and C spine negative.  No AAA. Metatarsal and possible medial malleolus fracture on L.  Cam walker placed.  Concern for syncope without prodrome.  Will admit for observation  Treat UTI.  Gently hydrate.  D/w Dr. Allena Katz.  Glynn Octave, MD 02/26/14 [phone]

## 2014-02-25 NOTE — Plan of Care (Signed)
Problem: Consults Goal: Diabetes Guidelines if Diabetic/Glucose > 140 If diabetic or lab glucose is > 140 mg/dl - Initiate Diabetes/Hyperglycemia Guidelines & Document Interventions  Outcome: Not Applicable Date Met:  02/25/14     

## 2014-02-25 NOTE — ED Notes (Signed)
Per [facility], pt stood up from sitting down and passed out, pt called ems this morning, they did a 12 lead this morning and it was fine, was hypotensive in the 80's. Pt tried to get up and walk around this evening and c/o left foot pain and lumbar back pain. Pt passed SCCA by ems. BP by ems now 144/90, 72 HR, 18 RR, 97% room air. Pt is AAOX4, in NAD.

## 2014-02-25 NOTE — ED Notes (Signed)
DR Rancour  at bedside.

## 2014-02-25 NOTE — ED Notes (Signed)
DR. Patel at bedside.

## 2014-02-25 NOTE — ED Notes (Signed)
Patient returned from CT

## 2014-02-25 NOTE — Plan of Care (Signed)
Problem: Consults Goal: Skin Care Protocol Initiated - if Braden Score 18 or less If consults are not indicated, leave blank or document N/A Outcome: Not Applicable Date Met:  62/83/15

## 2014-02-25 NOTE — ED Notes (Addendum)
Patient still out at XR.

## 2014-02-25 NOTE — H&P (Signed)
[facility] History and Physical  Patient: Jasmine [name]  ZOX:[id number]  DOB: 10-17-1922  DOS: the patient was seen and examined on 02/25/2014 PCP: Carrie MewJENKINS,JOHN EDWARD, MD  Chief Complaint: fall  HPI: Jasmine [name] is a 80 y.o. female with Past medical history of hypertension, A. Fib, hypothyroidism, GERD, coronary artery disease, fall risk. The patient is presenting with fall and syncope. She mentions that earlier in the morning when she was trying to go to the restroom she passed out. Her daughter heard the thump and found out that she was inside the restroom. She was initially not responsive but breathing. Within 3-4 minutes she started mumbling and later on was more awake. When EMS was called and they found that her blood pressure was 80 systolic and she was recommended to go to the ER for further workup but she refused. As the day progressed the patient started having complaints of pain in her left ankle and as it was difficult for her to move around she decided to come to the hospital. Patient mentions she is at high fall risk but her prior falls or most likely mechanical with losing her balance and tripping over things. She denies any passing out episodes like this. She complains of on and off dizziness when she is changing her position which is not new for her. There is no new change in her medication. She denies any chest pain shortness of breath dizziness lightheadedness vision changes drinking of the ear. She denies any nausea vomiting abdominal pain shortness of breath cough diarrhea constipation or burning urination. She denies any focal deficit.  [facility] most of her ADL.  Review of Systems: as mentioned in the history of present illness.  A Comprehensive review of the other systems is negative.  Past Medical History  Diagnosis Date  . Insomnia due to medical condition   . Anxiety   . Hyperlipidemia   .  Hypertension   . Hypothyroidism   . Low back pain   . Left wrist fracture 09/2007  . Fracture of right shoulder 02/2008  . GERD (gastroesophageal reflux disease)   . Barrett's esophagus   . Hemorrhoids, external   . CAD (coronary artery disease) of artery bypass graft 1995  . Hiatal hernia   . History of colon polyps   . Atrial fibrillation with RVR    Past Surgical History  Procedure Laterality Date  . Colonoscopy w/ polypectomy  06/2005  . Carotid endarterectomy    . Cataract extraction    . Carotid doppler Bilateral 05/15/2010    Right Bulb/CEA-demonstrated trace irregular nonhemodynamically significant plaque 0-49%. Left Bulb/Proximal ICA-demonstrated irregular nonhemodynamically signifcant plaque 0-49%.  . Cardiac catheterization  02/25/1996    No intervention-recommend CABG. Left main-80% concentric distal stenosis, LAD-99% ostial stenosis, first diag-80% segmental proximal stenosis, Circumflex-80% ostial stenosis, RCA-90% ostial stenosis.  . Cardiac catheterization  05/21/2003    No intervention-widely patent grafts.  . Coronary artery bypass graft  1997  . Lexiscan myoview  04/03/2011    No scintigraphic evidence of inducible myocardial ischemia. No lexiscan EKG changes. Non-diagnostic for ischemia.   . Transthoracic echocardiogram  01/08/2011    EF 50-55%, LA moderately dilated, moderate tricuspid regurg, mild pulmonary hypertension.   Social History:  reports that she quit smoking about 45 years ago. She has never used smokeless tobacco. She reports that she does not drink alcohol or use illicit drugs.  Allergies  Allergen Reactions  . Codeine   .  Meperidine Hcl   . Oxycodone-Acetaminophen     Family History  Problem Relation Age of Onset  . Diabetes Son   . Heart disease Mother   . Heart disease Father     Prior to Admission medications   Medication Sig Start Date End Date Taking? Authorizing Provider  aspirin 81 MG tablet Take 81 mg by mouth daily.     Yes  Historical Provider, MD  atorvastatin (LIPITOR) 80 MG tablet TAKE 1 TABLET BY MOUTH DAILY Patient taking differently: Take 80 mg by mouth daily. TAKE 1 TABLET BY MOUTH DAILY 11/03/13  Yes Stacie GlazeJohn E Jenkins, MD  bisoprolol (ZEBETA) 5 MG tablet Take 1 tablet (5 mg total) by mouth once daily.   Yes Stacie GlazeJohn E Jenkins, MD  Calcium Carbonate-Vitamin D (CALCIUM 600-D) 600-400 MG-UNIT per tablet Take 1 tablet by mouth daily.    Yes Historical Provider, MD  clonazePAM (KLONOPIN) 1 MG tablet Take a half tablet when you awaken in the morning take a half tablet at 6:00 PM and take a whole tablet  at bedtime Patient taking differently: Take 1 mg by mouth every morning. Take a half tablet when you awaken in the morning take a half tablet at 6:00 PM and take a whole tablet  at bedtime 11/29/13  Yes Stacie GlazeJohn E Jenkins, MD  cyanocobalamin 500 MCG tablet Take 500 mcg by mouth daily.     Yes Historical Provider, MD  diltiazem (TIAZAC) 120 MG 24 hr capsule TAKE 1 CAPSULE DAILY   Yes Runell GessJonathan J Berry, MD  levothyroxine (SYNTHROID, LEVOTHROID) 88 MCG tablet TAKE 1 TABLET DAILY   Yes Stacie GlazeJohn E Jenkins, MD  losartan (COZAAR) 100 MG tablet TAKE 1 TABLET DAILY Patient taking differently: Take 100 mg by mouth daily. TAKE 1 TABLET DAILY 11/03/13  Yes Stacie GlazeJohn E Jenkins, MD  lubiprostone (AMITIZA) 24 MCG capsule Take 1 capsule (24 mcg total) by mouth 2 (two) times daily with a meal. 12/23/12  Yes Stacie GlazeJohn E Jenkins, MD  mirtazapine (REMERON) 45 MG tablet TAKE 1 TABLET AT BEDTIME Patient taking differently: Take 45 mg by mouth at bedtime. TAKE 1 TABLET AT BEDTIME 11/29/13  Yes Stacie GlazeJohn E Jenkins, MD  Multiple Vitamin (MULTIVITAMIN) capsule Take 1 capsule by mouth daily.     Yes Historical Provider, MD  omeprazole (PRILOSEC) 20 MG capsule TAKE 1 CAPSULE DAILY 06/12/13  Yes Stacie GlazeJohn E Jenkins, MD  QUEtiapine (SEROQUEL) 50 MG tablet TAKE 1 TABLET AT BEDTIME 12/26/13  Yes Stacie GlazeJohn E Jenkins, MD  traMADol (ULTRAM) 50 MG tablet Take 1 tablet (50 mg total) by mouth every 8  (eight) hours as needed for pain. 12/02/12  Yes Stacie GlazeJohn E Jenkins, MD  vitamin E 400 UNIT capsule Take 400 Units by mouth daily.     Yes Historical Provider, MD  acetaminophen (TYLENOL) 500 MG tablet Take 500 mg by mouth every 6 (six) hours as needed for mild pain. For pain    Historical Provider, MD    Physical Exam: Filed Vitals:   02/25/14 1915 02/25/14 1921 02/25/14 2035 02/25/14 2115  BP: 150/75 150/75 154/84 146/80  Pulse: 101 104 98 89  Temp:      TempSrc:      Resp: 23 16 16 17   SpO2: 98% 98% 99% 100%    General: Alert, Awake and Oriented to Time, Place and Person. Appear in mild distress Eyes: PERRL ENT: Oral Mucosa clear moist. Neck: no JVD Cardiovascular: S1 and S2 Present, aortic systolic Murmur, Peripheral Pulses Present Respiratory: Bilateral Air entry  equal and Decreased, Clear to Auscultation, noCrackles, no wheezes Abdomen: Bowel Sound present, Soft and non tender Skin: no Rash Extremities: no Pedal edema, no calf tenderness left leg in ankle boot Neurologic: Grossly no focal neuro deficit.  Labs on Admission:  CBC:  Recent Labs Lab 02/25/14 1918  WBC 5.2  NEUTROABS 4.1  HGB 13.8  HCT 42.9  MCV 92.3  PLT 119*    CMP     Component Value Date/Time   NA 137 02/25/2014 1918   K 4.2 02/25/2014 1918   CL 97 02/25/2014 1918   CO2 27 02/25/2014 1918   GLUCOSE 136* 02/25/2014 1918   BUN 12 02/25/2014 1918   CREATININE 1.09 02/25/2014 1918   CREATININE 1.00 01/15/2014 1528   CALCIUM 9.2 02/25/2014 1918   PROT 6.6 02/25/2014 1918   ALBUMIN 3.6 02/25/2014 1918   AST 28 02/25/2014 1918   ALT 21 02/25/2014 1918   ALKPHOS 113 02/25/2014 1918   BILITOT 0.7 02/25/2014 1918   GFRNONAA 44* 02/25/2014 1918   GFRAA 51* 02/25/2014 1918    No results for input(s): LIPASE, AMYLASE in the last 168 hours. No results for input(s): AMMONIA in the last 168 hours.   Recent Labs Lab 02/25/14 1918  TROPONINI <0.30   BNP (last 3 results) No results for input(s):  PROBNP in the last 8760 hours.  Radiological Exams on Admission: Dg Lumbar Spine Complete  02/25/2014   CLINICAL DATA:  Per [facility], pt stood up from sitting down and passed out, pt called ems this morning, they did a 12 lead this morning and it was fine, was hypotensive in the 80's. Pt tried to get up and walk around this evening and c/o left foot pain and lumbar back pain. Pt passed SCCA by ems. BP by ems now 144/90, 72 HR, 18 RR, 97% room air. Pt is AAOX4, in NAD.  EXAM: LUMBAR SPINE - COMPLETE 4+ VIEW  COMPARISON:  None.  FINDINGS: Mild a moderate curvature convex to the right. There is moderate diffuse decreased bone mineralization. Vertebral body alignment and heights are within normal, although the degree of osteopenia makes evaluation of the vertebral body heights on the lateral film somewhat difficult. There is mild a moderate spondylosis throughout the lumbar spine. Mild disc space narrowing at the L5-S1 level. Moderate calcified plaque over the abdominal aorta and iliac arteries. There are mild degenerative changes of the hips.  IMPRESSION: No acute findings.  Mild a moderate spondylosis.  Mild disc disease at the L5-S1 level.   Electronically Signed   By: Elberta Fortis M.D.   On: 02/25/2014 20:07   Dg Ankle Complete Left  02/25/2014   CLINICAL DATA:  Left foot pain after a syncopal episode today when standing up from sitting down.  EXAM: LEFT ANKLE COMPLETE - 3+ VIEW  COMPARISON:  None.  FINDINGS: Diffuse osteopenia. Moderate sized inferior calcaneal spur. Atheromatous arterial calcifications. Possible nondisplaced fracture in the distal medial malleolus.  IMPRESSION: Possible nondisplaced fracture in the distal medial malleolus.   Electronically Signed   By: Gordan Payment M.D.   On: 02/25/2014 20:01   Ct Head Wo Contrast  02/25/2014   CLINICAL DATA:  Fall with back pain and foot pain. Headache and neck pain. Initial encounter  EXAM: CT HEAD WITHOUT CONTRAST  CT CERVICAL SPINE WITHOUT CONTRAST   TECHNIQUE: Multidetector CT imaging of the head and cervical spine was performed following the standard protocol without intravenous contrast. Multiplanar CT image reconstructions of the cervical spine were also  generated.  COMPARISON:  05/28/2005  FINDINGS: CT HEAD FINDINGS  Skull and Sinuses:Negative for fracture or destructive process. The mastoids, middle ears, and imaged paranasal sinuses are clear.  Orbits: No acute abnormality.  Brain: No evidence of acute abnormality, such as acute infarction, hemorrhage, hydrocephalus, or mass lesion/mass effect. There is generalized brain atrophy which is progressed from 2007. Mild chronic still vessel disease with ischemic gliosis noted in the left external capsules and around the lateral ventricles. Coarse calcification again noted in the left parietal region.  CT CERVICAL SPINE FINDINGS  There is a compression deformity at the T1 vertebral body which results in mild height loss (~30%). The fracture is new from 2007. No acute fracture line or surrounding fat infiltration is visible, but the fracture is still age indeterminate. Concavity of the T2 superior endplate which is also new from 2007. Smooth appearance favors remote injury. There is no gross cervical canal hematoma or prevertebral edema.  Diffuse spondylosis which is fairly typical of age. There is no evidence of high-grade canal stenosis.  IMPRESSION: 1. No acute intracranial injury. 2. Age indeterminate T1 compression fracture with mild height loss. 3. Generalized brain atrophy, progressed since 2007.   Electronically Signed   By: Tiburcio Pea M.D.   On: 02/25/2014 19:59   Ct Cervical Spine Wo Contrast  02/25/2014   CLINICAL DATA:  Fall with back pain and foot pain. Headache and neck pain. Initial encounter  EXAM: CT HEAD WITHOUT CONTRAST  CT CERVICAL SPINE WITHOUT CONTRAST  TECHNIQUE: Multidetector CT imaging of the head and cervical spine was performed following the standard protocol without intravenous  contrast. Multiplanar CT image reconstructions of the cervical spine were also generated.  COMPARISON:  05/28/2005  FINDINGS: CT HEAD FINDINGS  Skull and Sinuses:Negative for fracture or destructive process. The mastoids, middle ears, and imaged paranasal sinuses are clear.  Orbits: No acute abnormality.  Brain: No evidence of acute abnormality, such as acute infarction, hemorrhage, hydrocephalus, or mass lesion/mass effect. There is generalized brain atrophy which is progressed from 2007. Mild chronic still vessel disease with ischemic gliosis noted in the left external capsules and around the lateral ventricles. Coarse calcification again noted in the left parietal region.  CT CERVICAL SPINE FINDINGS  There is a compression deformity at the T1 vertebral body which results in mild height loss (~30%). The fracture is new from 2007. No acute fracture line or surrounding fat infiltration is visible, but the fracture is still age indeterminate. Concavity of the T2 superior endplate which is also new from 2007. Smooth appearance favors remote injury. There is no gross cervical canal hematoma or prevertebral edema.  Diffuse spondylosis which is fairly typical of age. There is no evidence of high-grade canal stenosis.  IMPRESSION: 1. No acute intracranial injury. 2. Age indeterminate T1 compression fracture with mild height loss. 3. Generalized brain atrophy, progressed since 2007.   Electronically Signed   By: Tiburcio Pea M.D.   On: 02/25/2014 19:59   US Aorta  02/25/2014   CLINICAL DATA:  Back pain. Syncope. Evaluate for abdominal aortic aneurysm.  EXAM: ULTRASOUND OF ABDOMINAL AORTA  TECHNIQUE: Ultrasound examination of the abdominal aorta was performed to evaluate for abdominal aortic aneurysm.  COMPARISON:  05/22/2003  FINDINGS: Abdominal Aorta  No aneurysm identified.  Atherosclerotic changes.  Maximum AP  Diameter:  2.1  Maximum TRV  Diameter: 2.1  IMPRESSION: No abdominal aortic aneurysm.  Moderate  atherosclerotic changes.   Electronically Signed   By: Luan Pulling.D.  On: 02/25/2014 20:24   Dg Foot Complete Left  02/25/2014   CLINICAL DATA:  Fall with back and foot pain.  Initial encounter  EXAM: LEFT FOOT - COMPLETE 3+ VIEW  COMPARISON:  None.  FINDINGS: Transverse, minimally displaced fractures of the third and fourth metatarsal necks. No joint malalignment.  Advanced diffuse osteopenia. Mild hallux valgus with first MTP osteoarthritis and small bony bunion. Chronic absence of the fifth proximal interphalangeal joint, likely postoperative.  IMPRESSION: Third and fourth metatarsal neck fractures.   Electronically Signed   By: Tiburcio Pea M.D.   On: 02/25/2014 20:02    EKG: Independently reviewed. atrial fibrillation, rate controlled.  Assessment/Plan Principal Problem:   Syncope Active Problems:   Hypothyroidism   At risk for falling   CAD - CABG '97, cath OK 2005, Myoview low risk 12/12   HTN (hypertension)   PAF (paroxysmal atrial fibrillation)   UTI (urinary tract infection)   1. Syncope The patient is presenting with complaints of a syncopal episode. She denies any prior syncopal episodes. At present she is neurologically intact. CT of the head is negative for any acute abnormality. EKG does not show any acute abnormality as well. Blood work looks within normal limits other than pyuria. She has orthostatic drop in her blood pressure. At present she will be admitted in the hospital under telemetry. I would gently hydrate her overnight and continue her regular medications. Monitor her on telemetry. Limited echocardiogram in the morning. PTOT consultation.  2.left ankle fracture. At present the patient has ankle boot applied. We will consult with orthopedics in the morning about outpatient follow-up.  3.A. Fib Not on anticoagulation due to her fall risk. At present rate controlled. Continue home medications.  4.essential hypertension. Continue home  medications.  5. Hypothyroidism. Continue Synthroid.  6. Possible UTI. I would treat her with IV ceftriaxone.  Advance goals of care discussion: DNR/DNI as per my discussion with patient in present of her daughter   DVT Prophylaxis: subcutaneous Heparin Nutrition: cardiac diet  Family Communication: daughter was present at bedside, opportunity was given to ask question and all questions were answered satisfactorily at the time of interview. Disposition: Admitted to observation in telemetry unit.  Author: Lynden Oxford, MD [facility] Pager: [phone] 02/25/2014, 10:24 PM    If 7PM-7AM, please contact night-coverage [email or web address] Password [email or web address]

## 2014-02-25 NOTE — Progress Notes (Signed)
Orthopedic Tech Progress Note Patient Details:  Jasmine [name] 1922/12/14 [id number] Applied Cam walker to LLE. Ortho Devices Type of Ortho Device: CAM walker Ortho Device/Splint Location: LLE Ortho Device/Splint Interventions: Application   Lesle ChrisGilliland, Sahasra Belue L 02/25/2014, 11:35 PM

## 2014-02-26 DIAGNOSIS — E039 Hypothyroidism, unspecified: Secondary | ICD-10-CM | POA: Diagnosis present

## 2014-02-26 DIAGNOSIS — D696 Thrombocytopenia, unspecified: Secondary | ICD-10-CM | POA: Diagnosis present

## 2014-02-26 DIAGNOSIS — I2510 Atherosclerotic heart disease of native coronary artery without angina pectoris: Secondary | ICD-10-CM | POA: Diagnosis present

## 2014-02-26 DIAGNOSIS — I369 Nonrheumatic tricuspid valve disorder, unspecified: Secondary | ICD-10-CM | POA: Diagnosis not present

## 2014-02-26 DIAGNOSIS — F419 Anxiety disorder, unspecified: Secondary | ICD-10-CM | POA: Diagnosis present

## 2014-02-26 DIAGNOSIS — I951 Orthostatic hypotension: Secondary | ICD-10-CM | POA: Diagnosis not present

## 2014-02-26 DIAGNOSIS — Z66 Do not resuscitate: Secondary | ICD-10-CM | POA: Diagnosis present

## 2014-02-26 DIAGNOSIS — I10 Essential (primary) hypertension: Secondary | ICD-10-CM | POA: Diagnosis present

## 2014-02-26 DIAGNOSIS — Z885 Allergy status to narcotic agent status: Secondary | ICD-10-CM | POA: Diagnosis not present

## 2014-02-26 DIAGNOSIS — S92332A Displaced fracture of third metatarsal bone, left foot, initial encounter for closed fracture: Secondary | ICD-10-CM | POA: Diagnosis present

## 2014-02-26 DIAGNOSIS — Z7982 Long term (current) use of aspirin: Secondary | ICD-10-CM | POA: Diagnosis not present

## 2014-02-26 DIAGNOSIS — S92335A Nondisplaced fracture of third metatarsal bone, left foot, initial encounter for closed fracture: Secondary | ICD-10-CM | POA: Diagnosis not present

## 2014-02-26 DIAGNOSIS — S92342A Displaced fracture of fourth metatarsal bone, left foot, initial encounter for closed fracture: Secondary | ICD-10-CM | POA: Diagnosis present

## 2014-02-26 DIAGNOSIS — Z9181 History of falling: Secondary | ICD-10-CM | POA: Diagnosis not present

## 2014-02-26 DIAGNOSIS — R55 Syncope and collapse: Secondary | ICD-10-CM | POA: Insufficient documentation

## 2014-02-26 DIAGNOSIS — M4854XA Collapsed vertebra, not elsewhere classified, thoracic region, initial encounter for fracture: Secondary | ICD-10-CM | POA: Diagnosis present

## 2014-02-26 DIAGNOSIS — Z8249 Family history of ischemic heart disease and other diseases of the circulatory system: Secondary | ICD-10-CM | POA: Diagnosis not present

## 2014-02-26 DIAGNOSIS — Z888 Allergy status to other drugs, medicaments and biological substances status: Secondary | ICD-10-CM | POA: Diagnosis not present

## 2014-02-26 DIAGNOSIS — N390 Urinary tract infection, site not specified: Secondary | ICD-10-CM | POA: Diagnosis not present

## 2014-02-26 DIAGNOSIS — M545 Low back pain: Secondary | ICD-10-CM | POA: Diagnosis present

## 2014-02-26 DIAGNOSIS — W1830XA Fall on same level, unspecified, initial encounter: Secondary | ICD-10-CM | POA: Diagnosis present

## 2014-02-26 DIAGNOSIS — I480 Paroxysmal atrial fibrillation: Secondary | ICD-10-CM | POA: Diagnosis not present

## 2014-02-26 DIAGNOSIS — K219 Gastro-esophageal reflux disease without esophagitis: Secondary | ICD-10-CM | POA: Diagnosis present

## 2014-02-26 DIAGNOSIS — Z833 Family history of diabetes mellitus: Secondary | ICD-10-CM | POA: Diagnosis not present

## 2014-02-26 DIAGNOSIS — Z87891 Personal history of nicotine dependence: Secondary | ICD-10-CM | POA: Diagnosis not present

## 2014-02-26 DIAGNOSIS — E785 Hyperlipidemia, unspecified: Secondary | ICD-10-CM | POA: Diagnosis present

## 2014-02-26 DIAGNOSIS — S8252XA Displaced fracture of medial malleolus of left tibia, initial encounter for closed fracture: Secondary | ICD-10-CM | POA: Diagnosis present

## 2014-02-26 LAB — COMPREHENSIVE METABOLIC PANEL
ALBUMIN: 2.9 g/dL — AB (ref 3.5–5.2)
ALT: 16 U/L (ref 0–35)
ANION GAP: 11 (ref 5–15)
AST: 25 U/L (ref 0–37)
Alkaline Phosphatase: 91 U/L (ref 39–117)
BUN: 12 mg/dL (ref 6–23)
CO2: 26 mEq/L (ref 19–32)
CREATININE: 1.03 mg/dL (ref 0.50–1.10)
Calcium: 8.5 mg/dL (ref 8.4–10.5)
Chloride: 99 mEq/L (ref 96–112)
GFR calc Af Amer: 54 mL/min — ABNORMAL LOW (ref >90)
GFR calc non Af Amer: 47 mL/min — ABNORMAL LOW (ref >90)
Glucose, Bld: 113 mg/dL — ABNORMAL HIGH (ref 70–99)
Potassium: 4.3 mEq/L (ref 3.7–5.3)
Sodium: 136 mEq/L — ABNORMAL LOW (ref 137–147)
TOTAL PROTEIN: 5.7 g/dL — AB (ref 6.0–8.3)
Total Bilirubin: 0.5 mg/dL (ref 0.3–1.2)

## 2014-02-26 LAB — CBC
HEMATOCRIT: 39.1 % (ref 36.0–46.0)
HEMOGLOBIN: 12.6 g/dL (ref 12.0–15.0)
MCH: 29.8 pg (ref 26.0–34.0)
MCHC: 32.2 g/dL (ref 30.0–36.0)
MCV: 92.4 fL (ref 78.0–100.0)
Platelets: 109 10*3/uL — ABNORMAL LOW (ref 150–400)
RBC: 4.23 MIL/uL (ref 3.87–5.11)
RDW: 13.7 % (ref 11.5–15.5)
WBC: 4.5 10*3/uL (ref 4.0–10.5)

## 2014-02-26 LAB — PROTIME-INR
INR: 1.14 (ref 0.00–1.49)
Prothrombin Time: 14.7 seconds (ref 11.6–15.2)

## 2014-02-26 LAB — TROPONIN I

## 2014-02-26 MED ORDER — LOSARTAN POTASSIUM 50 MG PO TABS
50.0000 mg | ORAL_TABLET | Freq: Every day | ORAL | Status: DC
  Administered 2014-02-26: 50 mg via ORAL
  Filled 2014-02-26 (×2): qty 1

## 2014-02-26 NOTE — Evaluation (Signed)
Occupational Therapy Evaluation Patient Details Name: Jasmine Dakinmma A Kosh MRN: [id number] DOB: 1922/05/12 Today's Date: 02/26/2014    History of Present Illness Jasmine [name] is a 80 y.o. female admitted with syncope/fall with resultant left non displaced distal medial malleolus and third and fourth metatarsal neck fractures. Past medical history of hypertension, A. Fib, hypothyroidism, GERD, coronary artery disease s/p CABG, fall risk. Orthostatic BPs taken earlier today showed at 10 point drop in systolic from supine to sit and a 14 point drop in systolic sit to stand   Clinical Impression   This 80 yo female admitted with above and PMHx per above presents to acute OT with decreased balance due to CAM walker on LLE--affecting her ability to be at an independent level pta. She will benefit from acute OT with follow up HHOT.    Follow Up Recommendations  Home health OT    Equipment Recommendations  None recommended by OT       Precautions / Restrictions Precautions Precautions: Fall Restrictions Weight Bearing Restrictions: Yes LLE Weight Bearing: Weight bearing as tolerated      Mobility Bed Mobility Overal bed mobility: Modified Independent                Transfers Overall transfer level: Needs assistance Equipment used: 1 person hand held assist Transfers: Sit to/from Stand;Stand Pivot Transfers Sit to Stand: Min assist Stand pivot transfers: Min assist            Balance Overall balance assessment: Needs assistance Sitting-balance support: No upper extremity supported;Feet supported Sitting balance-Leahy Scale: Good     Standing balance support: Single extremity supported Standing balance-Leahy Scale: Fair                              ADL Overall ADL's : Needs assistance/impaired Eating/Feeding: Independent;Sitting   Grooming: Set up;Sitting   Upper Body Bathing: Set up;Sitting   Lower Body Bathing: Minimal assistance;Sit to/from stand (for  LLE)   Upper Body Dressing : Set up;Sitting   Lower Body Dressing: Moderate assistance (with min A sit<>stand)   Toilet Transfer: Minimal assistance;Stand-pivot;BSC   Toileting- Clothing Manipulation and Hygiene: Minimal assistance;Sit to/from stand         General ADL Comments: Educated daughter on how to doff/donn CAM walker and pt/daughter that CAM walker should only be off for bathing (if she is going to shower then step in to shower with boot on, sit down, and then take boot off--finished showering  dry off, put boot back on, the step out of shower stall)--both verbalized understanding. Also she can have it off for LBD--just not standing on LLE without boot on. Recommended that pt's daughter get pt a bag or basket that she can put on the front of her RW so she can carry things she needs from place to place while she is up with her RW and will not have the use of her hands.  No c/o dizziness during our session               Pertinent Vitals/Pain Pain Assessment: No/denies pain     Hand Dominance Right   Extremity/Trunk Assessment Upper Extremity Assessment Upper Extremity Assessment: Overall WFL for tasks assessed           Communication Communication Communication: No difficulties   Cognition Arousal/Alertness: Awake/alert Behavior During Therapy: WFL for tasks assessed/performed Overall Cognitive Status: Within Functional Limits for tasks assessed  Home Living Family/patient expects to be discharged to:: Private residence Living Arrangements: Children Available Help at Discharge: Family;Available PRN/intermittently Type of Home: House Home Access: Stairs to enter Entergy CorporationEntrance Stairs-Number of Steps: 3 Entrance Stairs-Rails:  (a post to hold onto) Home Layout: One level     Bathroom Shower/Tub: Walk-in shower;Door   Foot LockerBathroom Toilet: Standard     Home Equipment: Bedside commode;Shower seat          Prior  Functioning/Environment Level of Independence: Independent             OT Diagnosis: Generalized weakness   OT Problem List: Decreased strength;Impaired balance (sitting and/or standing);Decreased range of motion;Decreased knowledge of use of DME or AE   OT Treatment/Interventions: Self-care/ADL training;Balance training;Patient/family education;DME and/or AE instruction    OT Goals(Current goals can be found in the care plan section) Acute Rehab OT Goals Patient Stated Goal: home OT Goal Formulation: With patient Time For Goal Achievement: 03/05/14 Potential to Achieve Goals: Good  OT Frequency: Min 2X/week   Barriers to D/C: Decreased caregiver support             End of Session    Activity Tolerance: Patient tolerated treatment well Patient left:  (sitting EOB with PT entering)   Time: [id number] OT Time Calculation (min): 27 min Charges:  OT General Charges $OT Visit: 1 Procedure OT Evaluation $Initial OT Evaluation Tier I: 1 Procedure OT Treatments $Self Care/Home Management : 8-22 mins  Evette GeorgesLeonard, Carlota Philley Eva [phone] 02/26/2014, 3:15 PM

## 2014-02-26 NOTE — Progress Notes (Signed)
  Echocardiogram 2D Echocardiogram has been performed.  Jasmine [name] 02/26/2014, 2:36 PM

## 2014-02-26 NOTE — Progress Notes (Signed)
VASCULAR LAB PRELIMINARY  PRELIMINARY  PRELIMINARY  PRELIMINARY  Carotid Dopplers completed.    Preliminary report:  1-39% ICA stenosis.  Right CEA patent.  Vertebral artery flow is antegrade.   Lizvet Chunn, RVT 02/26/2014, 2:17 PM

## 2014-02-26 NOTE — Progress Notes (Addendum)
PROGRESS NOTE    Jasmine [name] VHQ:[id number] DOB: 01/31/1923 DOA: 02/25/2014 PCP: Carrie MewJENKINS,JOHN EDWARD, MD  Cardiology: Dr. Nanetta BattyJonathan Berry.   HPI/Brief narrative 80 year old female patient with history of HTN, A. fib-not on anticoagulation probably secondary to fall risk, hypothyroidism, GERD, CAD s/p CABG, CEA, frequent falls, presented with an episode of syncope and fall at home. EMS found her with SBP 80 mmHg. In the ED, she was found to have fracture of left distal medial malleolus and third and 4 metatarsal neck. She gives history of intermittent dizziness but no prior syncope.   Assessment/Plan:  1. Syncope: Secondary to hypotension/orthostatic hypotension.CT head negative for acute findings. EKG without acute findings.troponins 2 negative. Follow-up orthostatic blood pressures, 2-D echo and carotid Dopplers. Briefly hydrated with IV fluids. PT and OT evaluation. 2. Hypotension/orthostatic hypotension: No reported GI fluid losses, fever or dysuria. Slightly reduced dose of ARB. Continue prior dose of Cardizem. Check orthostatic blood pressures. 3. Left non displaced distal medial malleolus and third and fourth metatarsal neck fractures: Patient has Cam boot. Orthopedics have been consulted and recommend weightbearing as tolerated and outpatient follow-up in 1 week. They will see patient in hospital PTA. Patient denies pain. 4. PAF: With controlled ventricular rate. Not anticoagulation candidate secondary to fall risk. Continue aspirin. 5. Essential hypertension: Management as above. 6. Hypothyroidism: Continue Synthroid. 7. Possible UTI: Continue IV Rocephin pending urine culture results. 8. DO NOT RESUSCITATE 9. CAD - CABG '97, cath OK 2005, Myoview low risk 12/12 10. Chronic Thrombocytopenia: Stable.   Code Status: DNR Family Communication: Discussed with daughter Ms. [name] on 11/9 Disposition Plan: Home when medically  stable.   Consultants:  orthopedics-pending  Procedures:  none  Antibiotics:  IV Rocephin 10/8 >  Subjective: Denies complaints. Denies pain in left ankle. Denies dyspnea or chest pain. No reported dizziness or lightheadedness.  Objective: Filed Vitals:   02/26/14 0500 02/26/14 1038 02/26/14 1041 02/26/14 1044  BP: 122/74 128/55 118/54 104/73  Pulse:  70 68 68  Temp: 98.1 F (36.7 C)     TempSrc:      Resp: 18     Height:      Weight: 53.3 kg (117 lb 8.1 oz)     SpO2: 97%       Intake/Output Summary (Last 24 hours) at 02/26/14 1155 Last data filed at 02/26/14 1054  Gross per 24 hour  Intake    305 ml  Output    350 ml  Net    -45 ml   Filed Weights   02/25/14 2240 02/26/14 0500  Weight: 53.298 kg (117 lb 8 oz) 53.3 kg (117 lb 8.1 oz)     Exam:  General exam: pleasant elderly female lying comfortably supine in bed. Respiratory system: Clear. No increased work of breathing. Cardiovascular system: S1 & S2 heard, RRR. No JVD, murmurs, gallops, clicks or pedal edema. Telemetry: PAF with controlled ventricular rate. Gastrointestinal system: Abdomen is nondistended, soft and nontender. Normal bowel sounds heard. Central nervous system: Alert and oriented. No focal neurological deficits. Extremities: Symmetric 5 x 5 power. Left lower extremity with cam boot.   Data Reviewed: Basic Metabolic Panel:  Recent Labs Lab 02/25/14 1918 02/26/14 0400  NA 137 136*  K 4.2 4.3  CL 97 99  CO2 27 26  GLUCOSE 136* 113*  BUN 12 12  CREATININE 1.09 1.03  CALCIUM 9.2 8.5   Liver Function Tests:  Recent Labs Lab 02/25/14 1918 02/26/14 0400  AST 28 25  ALT 21  16  ALKPHOS 113 91  BILITOT 0.7 0.5  PROT 6.6 5.7*  ALBUMIN 3.6 2.9*   No results for input(s): LIPASE, AMYLASE in the last 168 hours. No results for input(s): AMMONIA in the last 168 hours. CBC:  Recent Labs Lab 02/25/14 1918 02/26/14 0400  WBC 5.2 4.5  NEUTROABS 4.1  --   HGB 13.8 12.6  HCT  42.9 39.1  MCV 92.3 92.4  PLT 119* 109*   Cardiac Enzymes:  Recent Labs Lab 02/25/14 1918 02/26/14 0400  TROPONINI <0.30 <0.30   BNP (last 3 results) No results for input(s): PROBNP in the last 8760 hours. CBG: No results for input(s): GLUCAP in the last 168 hours.  No results found for this or any previous visit (from the past 240 hour(s)).        Studies: Dg Lumbar Spine Complete  02/25/2014   CLINICAL DATA:  Per [facility], pt stood up from sitting down and passed out, pt called ems this morning, they did a 12 lead this morning and it was fine, was hypotensive in the 80's. Pt tried to get up and walk around this evening and c/o left foot pain and lumbar back pain. Pt passed SCCA by ems. BP by ems now 144/90, 72 HR, 18 RR, 97% room air. Pt is AAOX4, in NAD.  EXAM: LUMBAR SPINE - COMPLETE 4+ VIEW  COMPARISON:  None.  FINDINGS: Mild a moderate curvature convex to the right. There is moderate diffuse decreased bone mineralization. Vertebral body alignment and heights are within normal, although the degree of osteopenia makes evaluation of the vertebral body heights on the lateral film somewhat difficult. There is mild a moderate spondylosis throughout the lumbar spine. Mild disc space narrowing at the L5-S1 level. Moderate calcified plaque over the abdominal aorta and iliac arteries. There are mild degenerative changes of the hips.  IMPRESSION: No acute findings.  Mild a moderate spondylosis.  Mild disc disease at the L5-S1 level.   Electronically Signed   By: Elberta Fortis M.D.   On: 02/25/2014 20:07   Dg Ankle Complete Left  02/25/2014   CLINICAL DATA:  Left foot pain after a syncopal episode today when standing up from sitting down.  EXAM: LEFT ANKLE COMPLETE - 3+ VIEW  COMPARISON:  None.  FINDINGS: Diffuse osteopenia. Moderate sized inferior calcaneal spur. Atheromatous arterial calcifications. Possible nondisplaced fracture in the distal medial malleolus.  IMPRESSION: Possible  nondisplaced fracture in the distal medial malleolus.   Electronically Signed   By: Gordan Payment M.D.   On: 02/25/2014 20:01   Ct Head Wo Contrast  02/25/2014   CLINICAL DATA:  Fall with back pain and foot pain. Headache and neck pain. Initial encounter  EXAM: CT HEAD WITHOUT CONTRAST  CT CERVICAL SPINE WITHOUT CONTRAST  TECHNIQUE: Multidetector CT imaging of the head and cervical spine was performed following the standard protocol without intravenous contrast. Multiplanar CT image reconstructions of the cervical spine were also generated.  COMPARISON:  05/28/2005  FINDINGS: CT HEAD FINDINGS  Skull and Sinuses:Negative for fracture or destructive process. The mastoids, middle ears, and imaged paranasal sinuses are clear.  Orbits: No acute abnormality.  Brain: No evidence of acute abnormality, such as acute infarction, hemorrhage, hydrocephalus, or mass lesion/mass effect. There is generalized brain atrophy which is progressed from 2007. Mild chronic still vessel disease with ischemic gliosis noted in the left external capsules and around the lateral ventricles. Coarse calcification again noted in the left parietal region.  CT CERVICAL SPINE FINDINGS  There is a compression deformity at the T1 vertebral body which results in mild height loss (~30%). The fracture is new from 2007. No acute fracture line or surrounding fat infiltration is visible, but the fracture is still age indeterminate. Concavity of the T2 superior endplate which is also new from 2007. Smooth appearance favors remote injury. There is no gross cervical canal hematoma or prevertebral edema.  Diffuse spondylosis which is fairly typical of age. There is no evidence of high-grade canal stenosis.  IMPRESSION: 1. No acute intracranial injury. 2. Age indeterminate T1 compression fracture with mild height loss. 3. Generalized brain atrophy, progressed since 2007.   Electronically Signed   By: Tiburcio PeaJonathan  Watts M.D.   On: 02/25/2014 19:59   Ct Cervical  Spine Wo Contrast  02/25/2014   CLINICAL DATA:  Fall with back pain and foot pain. Headache and neck pain. Initial encounter  EXAM: CT HEAD WITHOUT CONTRAST  CT CERVICAL SPINE WITHOUT CONTRAST  TECHNIQUE: Multidetector CT imaging of the head and cervical spine was performed following the standard protocol without intravenous contrast. Multiplanar CT image reconstructions of the cervical spine were also generated.  COMPARISON:  05/28/2005  FINDINGS: CT HEAD FINDINGS  Skull and Sinuses:Negative for fracture or destructive process. The mastoids, middle ears, and imaged paranasal sinuses are clear.  Orbits: No acute abnormality.  Brain: No evidence of acute abnormality, such as acute infarction, hemorrhage, hydrocephalus, or mass lesion/mass effect. There is generalized brain atrophy which is progressed from 2007. Mild chronic still vessel disease with ischemic gliosis noted in the left external capsules and around the lateral ventricles. Coarse calcification again noted in the left parietal region.  CT CERVICAL SPINE FINDINGS  There is a compression deformity at the T1 vertebral body which results in mild height loss (~30%). The fracture is new from 2007. No acute fracture line or surrounding fat infiltration is visible, but the fracture is still age indeterminate. Concavity of the T2 superior endplate which is also new from 2007. Smooth appearance favors remote injury. There is no gross cervical canal hematoma or prevertebral edema.  Diffuse spondylosis which is fairly typical of age. There is no evidence of high-grade canal stenosis.  IMPRESSION: 1. No acute intracranial injury. 2. Age indeterminate T1 compression fracture with mild height loss. 3. Generalized brain atrophy, progressed since 2007.   Electronically Signed   By: Tiburcio PeaJonathan  Watts M.D.   On: 02/25/2014 19:59   [place] Aorta  02/25/2014   CLINICAL DATA:  Back pain. Syncope. Evaluate for abdominal aortic aneurysm.  EXAM: ULTRASOUND OF ABDOMINAL AORTA   TECHNIQUE: Ultrasound examination of the abdominal aorta was performed to evaluate for abdominal aortic aneurysm.  COMPARISON:  05/22/2003  FINDINGS: Abdominal Aorta  No aneurysm identified.  Atherosclerotic changes.  Maximum AP  Diameter:  2.1  Maximum TRV  Diameter: 2.1  IMPRESSION: No abdominal aortic aneurysm.  Moderate atherosclerotic changes.   Electronically Signed   By: Loralie ChampagneMark  Gallerani M.D.   On: 02/25/2014 20:24   Dg Foot Complete Left  02/25/2014   CLINICAL DATA:  Fall with back and foot pain.  Initial encounter  EXAM: LEFT FOOT - COMPLETE 3+ VIEW  COMPARISON:  None.  FINDINGS: Transverse, minimally displaced fractures of the third and fourth metatarsal necks. No joint malalignment.  Advanced diffuse osteopenia. Mild hallux valgus with first MTP osteoarthritis and small bony bunion. Chronic absence of the fifth proximal interphalangeal joint, likely postoperative.  IMPRESSION: Third and fourth metatarsal neck fractures.   Electronically Signed   By: Christiane HaJonathan  Watts M.D.   On: 02/25/2014 20:02        Scheduled Meds: . aspirin  81 mg Oral Daily  . atorvastatin  80 mg Oral Daily  . bisoprolol  5 mg Oral Daily  . cefTRIAXone (ROCEPHIN)  IV  1 g Intravenous Q24H  . clonazePAM  1 mg Oral q morning - 10a  . diltiazem  120 mg Oral Daily  . heparin  5,000 Units Subcutaneous 3 times per day  . levothyroxine  88 mcg Oral QAC breakfast  . losartan  50 mg Oral Daily  . lubiprostone  24 mcg Oral BID WC  . mirtazapine  45 mg Oral QHS  . pantoprazole  40 mg Oral Daily  . QUEtiapine  50 mg Oral QHS  . sodium chloride  3 mL Intravenous Q12H   Continuous Infusions: . sodium chloride 50 mL/hr at 02/25/14 2300    Principal Problem:   Syncope Active Problems:   Hypothyroidism   At risk for falling   CAD - CABG '97, cath OK 2005, Myoview low risk 12/12   HTN (hypertension)   PAF (paroxysmal atrial fibrillation)   UTI (urinary tract infection)    Time spent: 40  minutes.    Marcellus Scott, MD, FACP, FHM. [facility] Pager [phone]  If 7PM-7AM, please contact night-coverage [email or web address] Password [email or web address] 02/26/2014, 11:55 AM    LOS: 1 day

## 2014-02-26 NOTE — Evaluation (Signed)
Physical Therapy Evaluation Patient Details Name: Jasmine [name] MRN: [id number] DOB: [birth year] Today's Date: 02/26/2014   History of Present Illness  Jasmine [name] is a 80 y.o. female admitted with syncope/fall with resultant left non displaced distal medial malleolus and third and fourth metatarsal neck fractures. Past medical history of hypertension, A. Fib, hypothyroidism, GERD, coronary artery disease s/p CABG, fall risk. Orthostatic BPs taken earlier today showed at 10 point drop in systolic from supine to sit and a 14 point drop in systolic sit to stand  Clinical Impression  Pt admitted with the above. Pt currently with functional limitations due to the deficits listed below (see PT Problem List). Ambulates generally well with the use of a rolling walker for stability. Safely completed stair training this afternoon with min assist. Daughter states she will be able to provide help at home most of the time, but will be gone some evenings. Pt will benefit from skilled PT to increase their independence and safety with mobility to allow discharge to the venue listed below.    Follow Up Recommendations Home health PT    Equipment Recommendations  Rolling walker with 5" wheels    Recommendations for Other Services       Precautions / Restrictions Precautions Precautions: Fall Restrictions Weight Bearing Restrictions: Yes LLE Weight Bearing: Weight bearing as tolerated Other Position/Activity Restrictions: Wearing CAM boot      Mobility  Bed Mobility Overal bed mobility: Modified Independent                Transfers Overall transfer level: Needs assistance Equipment used: Rolling walker (2 wheeled) Transfers: Sit to/from Stand Sit to Stand: Min guard Stand pivot transfers: Min assist       General transfer comment: Min guard for safety from lowest bed setting. VC for hand placement on stable surface to rise. No physical assist  required.  Ambulation/Gait Ambulation/Gait assistance: Min guard Ambulation Distance (Feet): 375 Feet Assistive device: Rolling walker (2 wheeled) Gait Pattern/deviations: Step-to pattern;Step-through pattern;Decreased step length - right;Decreased stance time - left;Antalgic;Trunk flexed   Gait velocity interpretation: Below normal speed for age/gender General Gait Details: educated on safe DME use with a rolling walker. Able to bear good amount of weight through LLE while in CAM boot. No loss of balance noted while using RW. VC for upright posture. Pt required 3 standing rest breaks to complete distance. Mildly dyspneic (unable to obtain SpO2 reading)  Stairs Stairs: Yes Stairs assistance: Min assist Stair Management: One rail Right;Step to pattern;Forwards (Hand held assist on Lt going up) Number of Stairs: 3 General stair comments: Educated pt on correct sequencing for stair navigation. Min assist for stability with hand-held assist. Safely performed this task and states she feels confident.  Wheelchair Mobility    Modified Rankin (Stroke Patients Only)       Balance Overall balance assessment: Needs assistance Sitting-balance support: No upper extremity supported;Feet supported Sitting balance-Leahy Scale: Good     Standing balance support: Single extremity supported Standing balance-Leahy Scale: Fair                               Pertinent Vitals/Pain Pain Assessment: 0-10 Pain Score:  ("doing pretty good" no value given) Pain Location: Lt ankle Pain Intervention(s): Monitored during session;Repositioned    Home Living Family/patient expects to be discharged to:: Private residence Living Arrangements: Children Available Help at Discharge: Family;Available PRN/intermittently Type of Home: House Home Access: Stairs to  enter Entrance Stairs-Rails: Right (a post to hold onto) Entrance Stairs-Number of Steps: 3 Home Layout: One level Home Equipment:  Bedside commode;Shower seat      Prior Function Level of Independence: Independent               Hand Dominance   Dominant Hand: Right    Extremity/Trunk Assessment   Upper Extremity Assessment: Overall WFL for tasks assessed           Lower Extremity Assessment: LLE deficits/detail   LLE Deficits / Details: Decreased strength and ROM - difficult to fully assess due to Immobilization     Communication   Communication: No difficulties  Cognition Arousal/Alertness: Awake/alert Behavior During Therapy: WFL for tasks assessed/performed Overall Cognitive Status: Within Functional Limits for tasks assessed                      General Comments General comments (skin integrity, edema, etc.): Pts daughter present and PT spoke with both pt and daughter concerning mobility and home enviornment including carrying items, safe furniture set up and other items for patient safety since pt will be alone at times.    Exercises General Exercises - Lower Extremity Ankle Circles/Pumps: AROM;Right;10 reps;Seated Long Arc Quad: Strengthening;10 reps;Seated;Both Hip ABduction/ADduction: AAROM;Left;10 reps;Supine Straight Leg Raises: Strengthening;Left;10 reps;Supine Hip Flexion/Marching: Strengthening;Both;10 reps;Seated      Assessment/Plan    PT Assessment Patient needs continued PT services  PT Diagnosis Abnormality of gait;Generalized weakness;Acute pain   PT Problem List Decreased strength;Decreased range of motion;Decreased activity tolerance;Decreased balance;Decreased mobility;Decreased knowledge of use of DME;Pain  PT Treatment Interventions DME instruction;Gait training;Stair training;Functional mobility training;Therapeutic activities;Therapeutic exercise;Balance training;Neuromuscular re-education;Patient/family education;Modalities   PT Goals (Current goals can be found in the Care Plan section) Acute Rehab PT Goals Patient Stated Goal: Go home PT Goal  Formulation: With patient Time For Goal Achievement: 03/12/14 Potential to Achieve Goals: Good    Frequency Min 5X/week   Barriers to discharge Decreased caregiver support Pt will be alone some evenings    Co-evaluation               End of Session Equipment Utilized During Treatment: Gait belt;Other (comment) (CAM BOOT LT) Activity Tolerance: Patient tolerated treatment well Patient left: in bed;with call bell/phone within reach;with family/visitor present Nurse Communication: Mobility status         Time: [id number] PT Time Calculation (min): 24 min   Charges:   PT Evaluation $Initial PT Evaluation Tier I: 1 Procedure PT Treatments $Gait Training: 8-22 mins   PT G Codes[place]:          Kelcey Wickstrom S 02/26/2014, 3:41 PM  Sunday SpillersLogan Secor [place], [place] CarolinaPT [phone]

## 2014-02-27 MED ORDER — CEFUROXIME AXETIL 250 MG PO TABS: 250.0000 mg | ORAL_TABLET | Freq: Two times a day (BID) | ORAL | Status: DC

## 2014-02-27 NOTE — Progress Notes (Signed)
Occupational Therapy Treatment Patient Details Name: Jasmine [name] MRN: [id number] DOB: 06/13/1922 Today's Date: 02/27/2014    History of present illness Jasmine [name] is a 80 y.o. female admitted with syncope/fall with resultant left non displaced distal medial malleolus and third and fourth metatarsal neck fractures. Past medical history of hypertension, A. Fib, hypothyroidism, GERD, coronary artery disease s/p CABG, fall risk. Orthostatic BPs taken earlier today showed at 10 point drop in systolic from supine to sit and a 14 point drop in systolic sit to stand   OT comments  Pt. Progressing well with acute OT goals.  Eager for d/c home when able.  Describes strong family support at home.    Follow Up Recommendations  Home health OT    Equipment Recommendations  None recommended by OT    Recommendations for Other Services      Precautions / Restrictions Precautions Precautions: Fall Restrictions Weight Bearing Restrictions: Yes LLE Weight Bearing: Weight bearing as tolerated Other Position/Activity Restrictions: Wearing CAM boot       Mobility Bed Mobility Overal bed mobility: Modified Independent                Transfers Overall transfer level: Needs assistance Equipment used: 1 person hand held assist Transfers: Sit to/from Stand;Stand Pivot Transfers Sit to Stand: Min guard Stand pivot transfers: Min guard       General transfer comment: Good stability upon standing with RW for support however requires cues for hand placement. Practiced x3 from lowest bed setting and again from reclining chair.    Balance                                   ADL Overall ADL's : Needs assistance/impaired                         Toilet Transfer: Min guard;BSC;Cueing for sequencing;Ambulation   Toileting- Clothing Manipulation and Hygiene: Supervision/safety;Sitting/lateral lean         General ADL Comments: pt. eager for d/c home.  describes  stong family support from daughter and granddaughter.  able to complete safe toileting and hygiene.        Vision                     Perception     Praxis      Cognition   Behavior During Therapy: [facility] HospitalWFL for tasks assessed/performed Overall Cognitive Status: Within Functional Limits for tasks assessed                       Extremity/Trunk Assessment               Exercises General Exercises - Lower Extremity Ankle Circles/Pumps: AROM;Right;Seated;10 reps Long Arc Quad: Strengthening;Seated;Left;15 reps Hip ABduction/ADduction: Left;10 reps;Supine;Strengthening Straight Leg Raises: Strengthening;Left;Supine;15 reps Hip Flexion/Marching: Strengthening;Seated;15 reps;Left Other Exercises Other Exercises: Left lateral flexion of c-spine with overpressure, 30 second stretch x3   Shoulder Instructions       General Comments      Pertinent Vitals/ Pain       Pain Assessment: No/denies pain Pain Score:  ("Neck hurts a little bit") Pain Location: Rt lateral and posterior neck Pain Descriptors / Indicators: Tightness Pain Intervention(s): Monitored during session;Repositioned  Home Living  Prior Functioning/Environment              Frequency Min 2X/week     Progress Toward Goals  OT Goals(current goals can now be found in the care plan section)  Progress towards OT goals: Progressing toward goals  Acute Rehab OT Goals Patient Stated Goal: Go home  Plan Discharge plan remains appropriate    Co-evaluation                 End of Session Equipment Utilized During Treatment: Gait belt   Activity Tolerance Patient tolerated treatment well   Patient Left in chair;with call bell/phone within reach   Nurse Communication          Time: [id number] OT Time Calculation (min): 31 min  Charges: OT General Charges $OT Visit: 1 Procedure OT Treatments $Self Care/Home Management  : 23-37 mins  Robet LeuMorris, Beyla Loney [place], COTA/L 02/27/2014, 11:39 AM

## 2014-02-27 NOTE — Discharge Instructions (Signed)
Syncope °Syncope is a medical term for fainting or passing out. This means you lose consciousness and drop to the ground. People are generally unconscious for less than 5 minutes. You may have some muscle twitches for up to 15 seconds before waking up and returning to normal. Syncope occurs more often in older adults, but it can happen to anyone. While most causes of syncope are not dangerous, syncope can be a sign of a serious medical problem. It is important to seek medical care.  °CAUSES  °Syncope is caused by a sudden drop in blood flow to the brain. The specific cause is often not determined. Factors that can bring on syncope include: °· Taking medicines that lower blood pressure. °· Sudden changes in posture, such as standing up quickly. °· Taking more medicine than prescribed. °· Standing in one place for too long. °· Seizure disorders. °· Dehydration and excessive exposure to heat. °· Low blood sugar (hypoglycemia). °· Straining to have a bowel movement. °· Heart disease, irregular heartbeat, or other circulatory problems. °· Fear, emotional distress, seeing blood, or severe pain. °SYMPTOMS  °Right before fainting, you may: °· Feel dizzy or light-headed. °· Feel nauseous. °· See all white or all black in your field of vision. °· Have cold, clammy skin. °DIAGNOSIS  °Your health care provider will ask about your symptoms, perform a physical exam, and perform an electrocardiogram (ECG) to record the electrical activity of your heart. Your health care provider may also perform other heart or blood tests to determine the cause of your syncope which may include: °· Transthoracic echocardiogram (TTE). During echocardiography, sound waves are used to evaluate how blood flows through your heart. °· Transesophageal echocardiogram (TEE). °· Cardiac monitoring. This allows your health care provider to monitor your heart rate and rhythm in real time. °· Holter monitor. This is a portable device that records your  heartbeat and can help diagnose heart arrhythmias. It allows your health care provider to track your heart activity for several days, if needed. °· Stress tests by exercise or by giving medicine that makes the heart beat faster. °TREATMENT  °In most cases, no treatment is needed. Depending on the cause of your syncope, your health care provider may recommend changing or stopping some of your medicines. °HOME CARE INSTRUCTIONS °· Have someone stay with you until you feel stable. °· Do not drive, use machinery, or play sports until your health care provider says it is okay. °· Keep all follow-up appointments as directed by your health care provider. °· Lie down right away if you start feeling like you might faint. Breathe deeply and steadily. Wait until all the symptoms have passed. °· Drink enough fluids to keep your urine clear or pale yellow. °· If you are taking blood pressure or heart medicine, get up slowly and take several minutes to sit and then stand. This can reduce dizziness. °SEEK IMMEDIATE MEDICAL CARE IF:  °· You have a severe headache. °· You have unusual pain in the chest, abdomen, or back. °· You are bleeding from your mouth or rectum, or you have black or tarry stool. °· You have an irregular or very fast heartbeat. °· You have pain with breathing. °· You have repeated fainting or seizure-like jerking during an episode. °· You faint when sitting or lying down. °· You have confusion. °· You have trouble walking. °· You have severe weakness. °· You have vision problems. °If you fainted, call your local emergency services (911 in [place] Do not drive   yourself to the hospital.  MAKE SURE YOU:  Understand these instructions.  Will watch your condition.  Will get help right away if you are not doing well or get worse. Document Released: 04/06/2005 Document Revised: 04/11/2013 Document Reviewed: 06/05/2011 [facility] LPExitCare Patient Information 2015 [place], [place]. This information is not intended to replace  advice given to you by your health care provider. Make sure you discuss any questions you have with your health care provider.  Orthostatic Hypotension Orthostatic hypotension is a sudden drop in blood pressure. It happens when you quickly stand up from a seated or lying position. You may feel dizzy or light-headed. This can last for just a few seconds or for up to a few minutes. It is usually not a serious problem. However, if this happens frequently or gets worse, it can be a sign of something more serious. CAUSES  Different things can cause orthostatic hypotension, including:   Loss of body fluids (dehydration).  Medicines that lower blood pressure.  Sudden changes in posture, such as standing up quickly after you have been sitting or lying down.  Taking too much of your medicine. SIGNS AND SYMPTOMS   Light-headedness or dizziness.   Fainting or near-fainting.   A fast heart rate.   Weakness.   Feeling tired (fatigue).  DIAGNOSIS  Your health care provider may do several things to help diagnose your condition and identify the cause. These may include:   Taking a medical history and doing a physical exam.  Checking your blood pressure. Your health care provider will check your blood pressure when you are:  Lying down.  Sitting.  Standing.  Using tilt table testing. In this test, you lie down on a table that moves from a lying position to a standing position. You will be strapped onto the table. This test monitors your blood pressure and heart rate when you are in different positions. TREATMENT  Treatment will vary depending on the cause. Possible treatments include:   Changing the dosage of your medicines.  Wearing compression stockings on your lower legs.  Standing up slowly after sitting or lying down.  Eating more salt.  Eating frequent, small meals.  In some cases, getting IV fluids.  Taking medicine to enhance fluid retention. HOME CARE  INSTRUCTIONS  Only take over-the-counter or prescription medicines as directed by your health care provider.  Follow your health care provider's instructions for changing the dosage of your current medicines.  Do not stop or adjust your medicine on your own.  Stand up slowly after sitting or lying down. This allows your body to adjust to the different position.  Wear compression stockings as directed.  Eat extra salt as directed.  Do not add extra salt to your diet unless directed to by your health care provider.  Eat frequent, small meals.  Avoid standing suddenly after eating.  Avoid hot showers or excessive heat as directed by your health care provider.  Keep all follow-up appointments. SEEK MEDICAL CARE IF:  You continue to feel dizzy or light-headed after standing.  You feel groggy or confused.  You feel cold, clammy, or sick to your stomach (nauseous).  You have blurred vision.  You feel short of breath. SEEK IMMEDIATE MEDICAL CARE IF:   You faint after standing.  You have chest pain.  You have difficulty breathing.   You lose feeling or movement in your arms or legs.   You have slurred speech or difficulty talking, or you are unable to talk.  MAKE SURE  YOU:   Understand these instructions.  Will watch your condition.  Will get help right away if you are not doing well or get worse. Document Released: 03/27/2002 Document Revised: 04/11/2013 Document Reviewed: 01/27/2013 [facility]Exit[facility] Patient Information 2015 [place]ExitCare, [place]LL[place]. This information is not intended to replace advice given to you by your health care provider. Make sure you discuss any questions you have with your health care provider.   Urinary Tract Infection Urinary tract infections (UTIs) can develop anywhere along your urinary tract. Your urinary tract is your body's drainage system for removing wastes and extra water. Your urinary tract includes two kidneys, two ureters, a bladder, and a  urethra. Your kidneys are a pair of bean-shaped organs. Each kidney is about the size of your fist. They are located below your ribs, one on each side of your spine. CAUSES Infections are caused by microbes, which are microscopic organisms, including fungi, viruses, and bacteria. These organisms are so small that they can only be seen through a microscope. Bacteria are the microbes that most commonly cause UTIs. SYMPTOMS  Symptoms of UTIs may vary by age and gender of the patient and by the location of the infection. Symptoms in young women typically include a frequent and intense urge to urinate and a painful, burning feeling in the bladder or urethra during urination. Older women and men are more likely to be tired, shaky, and weak and have muscle aches and abdominal pain. A fever may mean the infection is in your kidneys. Other symptoms of a kidney infection include pain in your back or sides below the ribs, nausea, and vomiting. DIAGNOSIS To diagnose a UTI, your caregiver will ask you about your symptoms. Your caregiver also will ask to provide a urine sample. The urine sample will be tested for bacteria and white blood cells. White blood cells are made by your body to help fight infection. TREATMENT  Typically, UTIs can be treated with medication. Because most UTIs are caused by a bacterial infection, they usually can be treated with the use of antibiotics. The choice of antibiotic and length of treatment depend on your symptoms and the type of bacteria causing your infection. HOME CARE INSTRUCTIONS  If you were prescribed antibiotics, take them exactly as your caregiver instructs you. Finish the medication even if you feel better after you have only taken some of the medication.  Drink enough water and fluids to keep your urine clear or pale yellow.  Avoid caffeine, tea, and carbonated beverages. They tend to irritate your bladder.  Empty your bladder often. Avoid holding urine for long  periods of time.  Empty your bladder before and after sexual intercourse.  After a bowel movement, women should cleanse from front to back. Use each tissue only once. SEEK MEDICAL CARE IF:   You have back pain.  You develop a fever.  Your symptoms do not begin to resolve within 3 days. SEEK IMMEDIATE MEDICAL CARE IF:   You have severe back pain or lower abdominal pain.  You develop chills.  You have nausea or vomiting.  You have continued burning or discomfort with urination. MAKE SURE YOU:   Understand these instructions.  Will watch your condition.  Will get help right away if you are not doing well or get worse. Document Released: 01/14/2005 Document Revised: 10/06/2011 Document Reviewed: 05/15/2011 [facility] Patient Information 2015 [place]ExitCare, [place]LL[place]. This information is not intended to replace advice given to you by your health care provider. Make sure you discuss any questions you  have with your health care provider. ° °

## 2014-02-27 NOTE — Plan of Care (Signed)
Problem: Phase II Progression Outcomes Goal: Progress activity as tolerated unless otherwise ordered Outcome: Completed/Met Date Met:  02/27/14 Goal: Vital signs remain stable Outcome: Completed/Met Date Met:  02/27/14 Goal: IV changed to normal saline lock Outcome: Completed/Met Date Met:  02/27/14

## 2014-02-27 NOTE — Progress Notes (Signed)
Physical Therapy Treatment Patient Details Name: Jasmine [name] MRN: [id number] DOB: 04-Jul-1922 Today's Date: 02/27/2014    History of Present Illness Jasmine [name] is a 80 y.o. female admitted with syncope/fall with resultant left non displaced distal medial malleolus and third and fourth metatarsal neck fractures. Past medical history of hypertension, A. Fib, hypothyroidism, GERD, coronary artery disease s/p CABG, fall risk. Orthostatic BPs taken earlier today showed at 10 point drop in systolic from supine to sit and a 14 point drop in systolic sit to stand    PT Comments    Patient continues to progress well towards PT goals, ambulating up to 400 feet with supervision while using a rolling walker. Tolerates therapeutic exercises well and is eager to return home as soon as possible. Feel she is adequate for d/c from a physical therapy standpoint when medically ready.  Follow Up Recommendations  Home health PT     Equipment Recommendations  Rolling walker with 5" wheels    Recommendations for Other Services       Precautions / Restrictions Precautions Precautions: Fall Restrictions Weight Bearing Restrictions: Yes LLE Weight Bearing: Weight bearing as tolerated Other Position/Activity Restrictions: Wearing CAM boot    Mobility  Bed Mobility Overal bed mobility: Modified Independent                Transfers Overall transfer level: Needs assistance Equipment used: Rolling walker (2 wheeled) Transfers: Sit to/from Stand Sit to Stand: Supervision         General transfer comment: Good stability upon standing with RW for support however requires cues for hand placement. Practiced x3 from lowest bed setting and again from reclining chair.  Ambulation/Gait Ambulation/Gait assistance: Supervision Ambulation Distance (Feet): 400 Feet Assistive device: Rolling walker (2 wheeled) Gait Pattern/deviations: Step-through pattern;Decreased stride length;Decreased step length  - right;Decreased stance time - left;Antalgic     General Gait Details: Focused on symmetry of gait with intermittent cues for upright posture. No loss of balance noted while using RW for stability. Demos good control of RW.   Stairs            Wheelchair Mobility    Modified Rankin (Stroke Patients Only)       Balance                                    Cognition Arousal/Alertness: Awake/alert Behavior During Therapy: WFL for tasks assessed/performed Overall Cognitive Status: Within Functional Limits for tasks assessed                      Exercises General Exercises - Lower Extremity Ankle Circles/Pumps: AROM;Right;Seated;10 reps Long Arc Quad: Strengthening;Seated;Left;15 reps Hip ABduction/ADduction: Left;10 reps;Supine;Strengthening Straight Leg Raises: Strengthening;Left;Supine;15 reps Hip Flexion/Marching: Strengthening;Seated;15 reps;Left Other Exercises Other Exercises: Left lateral flexion of c-spine with overpressure, 30 second stretch x3    General Comments        Pertinent Vitals/Pain Pain Assessment: 0-10 Pain Score:  ("Neck hurts a little bit") Pain Location: Rt lateral and posterior neck Pain Descriptors / Indicators: Tightness Pain Intervention(s): Monitored during session;Repositioned    Home Living                      Prior Function            PT Goals (current goals can now be found in the care plan section) Acute Rehab PT Goals Patient Stated  Goal: Go home PT Goal Formulation: With patient Time For Goal Achievement: 03/12/14 Potential to Achieve Goals: Good Progress towards PT goals: Progressing toward goals    Frequency  Min 5X/week    PT Plan Current plan remains appropriate    Co-evaluation             End of Session Equipment Utilized During Treatment: Gait belt;Other (comment) (CAM BOOT LT) Activity Tolerance: Patient tolerated treatment well Patient left: in bed;with call  bell/phone within reach     Time: 1035-1102 PT Time Calculation (min) (ACUTE ONLY): 27 min  Charges:  $Gait Training: 8-22 mins $Therapeutic Exercise: 8-22 mins                    G Codes:      [place]erton MountBarbour, Carrina Schoenberger S 02/27/2014, 11:37 AM  Charlsie MerlesLogan Secor Maudell Stanbrough, [place]PT [phone]

## 2014-02-27 NOTE — Consult Note (Signed)
Reason for Consult: L distal medial malleolus and third and fourth metatarsal neck fx's   Referring Physician: Dr Nash Dimmer is an 80 y.o. female.  HPI:  Patient with history of HTN, A. fib-not on anticoagulation secondary to fall risk, hypothyroidism, GERD, CAD s/p CABG, CEA, frequent falls, presented with an episode of syncope and fall at home 02/26/2014 to ED. EMS found her with SBP 80 mmHg. In the ED, she was found to have fracture of left distal medial malleolus and third and 4 metatarsal neck and we were consulted by telephone. She gives history of intermittent dizziness but no prior syncope. Hx of prior fx in L foot previously healed. Pt denies px in foot. Has been placed in CAM boot already. Has been up with PT/OT WBAT and doing well. Denies L LE px. Denies chg in temp/loss of sensation.   Past Medical History  Diagnosis Date  . Insomnia due to medical condition   . Anxiety   . Hyperlipidemia   . Hypertension   . Hypothyroidism   . Low back pain   . Left wrist fracture 09/2007  . Fracture of right shoulder 02/2008  . GERD (gastroesophageal reflux disease)   . Barrett's esophagus   . Hemorrhoids, external   . CAD (coronary artery disease) of artery bypass graft 1995  . Hiatal hernia   . History of colon polyps   . Atrial fibrillation with RVR     Past Surgical History  Procedure Laterality Date  . Colonoscopy w/ polypectomy  06/2005  . Carotid endarterectomy    . Cataract extraction    . Carotid doppler Bilateral 05/15/2010    Right Bulb/CEA-demonstrated trace irregular nonhemodynamically significant plaque 0-49%. Left Bulb/Proximal ICA-demonstrated irregular nonhemodynamically signifcant plaque 0-49%.  . Cardiac catheterization  02/25/1996    No intervention-recommend CABG. Left main-80% concentric distal stenosis, LAD-99% ostial stenosis, first diag-80% segmental proximal stenosis, Circumflex-80% ostial stenosis, RCA-90% ostial stenosis.  . Cardiac catheterization   05/21/2003    No intervention-widely patent grafts.  . Coronary artery bypass graft  1997  . Lexiscan myoview  04/03/2011    No scintigraphic evidence of inducible myocardial ischemia. No lexiscan EKG changes. Non-diagnostic for ischemia.   . Transthoracic echocardiogram  01/08/2011    EF 50-55%, LA moderately dilated, moderate tricuspid regurg, mild pulmonary hypertension.    Family History  Problem Relation Age of Onset  . Diabetes Son   . Heart disease Mother   . Heart disease Father     Social History:  reports that she quit smoking about 45 years ago. She has never used smokeless tobacco. She reports that she does not drink alcohol or use illicit drugs.  Allergies:  Allergies  Allergen Reactions  . Codeine   . Meperidine Hcl   . Oxycodone-Acetaminophen     No current facility-administered medications on file prior to encounter.   Current Outpatient Prescriptions on File Prior to Encounter  Medication Sig Dispense Refill  . aspirin 81 MG tablet Take 81 mg by mouth daily.      Marland Kitchen atorvastatin (LIPITOR) 80 MG tablet TAKE 1 TABLET BY MOUTH DAILY (Patient taking differently: Take 80 mg by mouth daily. TAKE 1 TABLET BY MOUTH DAILY) 90 tablet 3  . bisoprolol (ZEBETA) 5 MG tablet Take 1 tablet (5 mg total) by mouth once daily.    . Calcium Carbonate-Vitamin D (CALCIUM 600-D) 600-400 MG-UNIT per tablet Take 1 tablet by mouth daily.     . clonazePAM (KLONOPIN) 1 MG tablet Take  a half tablet when you awaken in the morning take a half tablet at 6:00 PM and take a whole tablet  at bedtime (Patient taking differently: Take 1 mg by mouth every morning. Take a half tablet when you awaken in the morning take a half tablet at 6:00 PM and take a whole tablet  at bedtime) 180 tablet 1  . cyanocobalamin 500 MCG tablet Take 500 mcg by mouth daily.      Marland Kitchen diltiazem (TIAZAC) 120 MG 24 hr capsule TAKE 1 CAPSULE DAILY 90 capsule 2  . levothyroxine (SYNTHROID, LEVOTHROID) 88 MCG tablet TAKE 1 TABLET  DAILY 90 tablet 2  . losartan (COZAAR) 100 MG tablet TAKE 1 TABLET DAILY (Patient taking differently: Take 100 mg by mouth daily. TAKE 1 TABLET DAILY) 90 tablet 3  . lubiprostone (AMITIZA) 24 MCG capsule Take 1 capsule (24 mcg total) by mouth 2 (two) times daily with a meal. 180 capsule 3  . mirtazapine (REMERON) 45 MG tablet TAKE 1 TABLET AT BEDTIME (Patient taking differently: Take 45 mg by mouth at bedtime. TAKE 1 TABLET AT BEDTIME) 90 tablet 3  . Multiple Vitamin (MULTIVITAMIN) capsule Take 1 capsule by mouth daily.      Marland Kitchen omeprazole (PRILOSEC) 20 MG capsule TAKE 1 CAPSULE DAILY 90 capsule 3  . QUEtiapine (SEROQUEL) 50 MG tablet TAKE 1 TABLET AT BEDTIME 90 tablet 2  . traMADol (ULTRAM) 50 MG tablet Take 1 tablet (50 mg total) by mouth every 8 (eight) hours as needed for pain. 150 tablet 3  . vitamin E 400 UNIT capsule Take 400 Units by mouth daily.      Marland Kitchen acetaminophen (TYLENOL) 500 MG tablet Take 500 mg by mouth every 6 (six) hours as needed for mild pain. For pain      Results for orders placed or performed during the hospital encounter of 02/25/14 (from the past 48 hour(s))  Comprehensive metabolic panel     Status: Abnormal   Collection Time: 02/25/14  7:18 PM  Result Value Ref Range   Sodium 137 137 - 147 mEq/L   Potassium 4.2 3.7 - 5.3 mEq/L   Chloride 97 96 - 112 mEq/L   CO2 27 19 - 32 mEq/L   Glucose, Bld 136 (H) 70 - 99 mg/dL   BUN 12 6 - 23 mg/dL   Creatinine, Ser 1.09 0.50 - 1.10 mg/dL   Calcium 9.2 8.4 - 10.5 mg/dL   Total Protein 6.6 6.0 - 8.3 g/dL   Albumin 3.6 3.5 - 5.2 g/dL   AST 28 0 - 37 U/L   ALT 21 0 - 35 U/L   Alkaline Phosphatase 113 39 - 117 U/L   Total Bilirubin 0.7 0.3 - 1.2 mg/dL   GFR calc non Af Amer 44 (L) >90 mL/min   GFR calc Af Amer 51 (L) >90 mL/min    Comment: (NOTE) The eGFR has been calculated using the CKD EPI equation. This calculation has not been validated in all clinical situations. eGFR's persistently <90 mL/min signify possible Chronic  Kidney Disease.    Anion gap 13 5 - 15  CBC with Differential     Status: Abnormal   Collection Time: 02/25/14  7:18 PM  Result Value Ref Range   WBC 5.2 4.0 - 10.5 K/uL   RBC 4.65 3.87 - 5.11 MIL/uL   Hemoglobin 13.8 12.0 - 15.0 g/dL   HCT 42.9 36.0 - 46.0 %   MCV 92.3 78.0 - 100.0 fL   MCH 29.7 26.0 -  34.0 pg   MCHC 32.2 30.0 - 36.0 g/dL   RDW 13.6 11.5 - 15.5 %   Platelets 119 (L) 150 - 400 K/uL    Comment: PLATELET COUNT CONFIRMED BY SMEAR   Neutrophils Relative % 78 (H) 43 - 77 %   Neutro Abs 4.1 1.7 - 7.7 K/uL   Lymphocytes Relative 14 12 - 46 %   Lymphs Abs 0.7 0.7 - 4.0 K/uL   Monocytes Relative 8 3 - 12 %   Monocytes Absolute 0.4 0.1 - 1.0 K/uL   Eosinophils Relative 0 0 - 5 %   Eosinophils Absolute 0.0 0.0 - 0.7 K/uL   Basophils Relative 0 0 - 1 %   Basophils Absolute 0.0 0.0 - 0.1 K/uL  Protime-INR     Status: None   Collection Time: 02/25/14  7:18 PM  Result Value Ref Range   Prothrombin Time 13.7 11.6 - 15.2 seconds   INR 1.04 0.00 - 1.49  Troponin I     Status: None   Collection Time: 02/25/14  7:18 PM  Result Value Ref Range   Troponin I <0.30 <0.30 ng/mL    Comment:        Due to the release kinetics of cTnI, a negative result within the first hours of the onset of symptoms does not rule out myocardial infarction with certainty. If myocardial infarction is still suspected, repeat the test at appropriate intervals.   Urinalysis, Routine w reflex microscopic     Status: Abnormal   Collection Time: 02/25/14 11:02 PM  Result Value Ref Range   Color, Urine YELLOW YELLOW   APPearance TURBID (A) CLEAR   Specific Gravity, Urine 1.014 1.005 - 1.030   pH 8.0 5.0 - 8.0   Glucose, UA NEGATIVE NEGATIVE mg/dL   Hgb urine dipstick SMALL (A) NEGATIVE   Bilirubin Urine NEGATIVE NEGATIVE   Ketones, ur NEGATIVE NEGATIVE mg/dL   Protein, ur NEGATIVE NEGATIVE mg/dL   Urobilinogen, UA 0.2 0.0 - 1.0 mg/dL   Nitrite POSITIVE (A) NEGATIVE   Leukocytes, UA LARGE (A)  NEGATIVE  Urine microscopic-add on     Status: Abnormal   Collection Time: 02/25/14 11:02 PM  Result Value Ref Range   Squamous Epithelial / LPF RARE RARE   WBC, UA TOO NUMEROUS TO COUNT <3 WBC/hpf   RBC / HPF 3-6 <3 RBC/hpf   Bacteria, UA MANY (A) RARE  Comprehensive metabolic panel     Status: Abnormal   Collection Time: 02/26/14  4:00 AM  Result Value Ref Range   Sodium 136 (L) 137 - 147 mEq/L   Potassium 4.3 3.7 - 5.3 mEq/L   Chloride 99 96 - 112 mEq/L   CO2 26 19 - 32 mEq/L   Glucose, Bld 113 (H) 70 - 99 mg/dL   BUN 12 6 - 23 mg/dL   Creatinine, Ser 1.03 0.50 - 1.10 mg/dL   Calcium 8.5 8.4 - 10.5 mg/dL   Total Protein 5.7 (L) 6.0 - 8.3 g/dL   Albumin 2.9 (L) 3.5 - 5.2 g/dL   AST 25 0 - 37 U/L   ALT 16 0 - 35 U/L   Alkaline Phosphatase 91 39 - 117 U/L   Total Bilirubin 0.5 0.3 - 1.2 mg/dL   GFR calc non Af Amer 47 (L) >90 mL/min   GFR calc Af Amer 54 (L) >90 mL/min    Comment: (NOTE) The eGFR has been calculated using the CKD EPI equation. This calculation has not been validated in all clinical situations.  eGFR's persistently <90 mL/min signify possible Chronic Kidney Disease.    Anion gap 11 5 - 15  CBC     Status: Abnormal   Collection Time: 02/26/14  4:00 AM  Result Value Ref Range   WBC 4.5 4.0 - 10.5 K/uL   RBC 4.23 3.87 - 5.11 MIL/uL   Hemoglobin 12.6 12.0 - 15.0 g/dL   HCT 39.1 36.0 - 46.0 %   MCV 92.4 78.0 - 100.0 fL   MCH 29.8 26.0 - 34.0 pg   MCHC 32.2 30.0 - 36.0 g/dL   RDW 13.7 11.5 - 15.5 %   Platelets 109 (L) 150 - 400 K/uL    Comment: CONSISTENT WITH PREVIOUS RESULT  Protime-INR     Status: None   Collection Time: 02/26/14  4:00 AM  Result Value Ref Range   Prothrombin Time 14.7 11.6 - 15.2 seconds   INR 1.14 0.00 - 1.49  Troponin I     Status: None   Collection Time: 02/26/14  4:00 AM  Result Value Ref Range   Troponin I <0.30 <0.30 ng/mL    Comment:        Due to the release kinetics of cTnI, a negative result within the first hours of  the onset of symptoms does not rule out myocardial infarction with certainty. If myocardial infarction is still suspected, repeat the test at appropriate intervals.     Dg Lumbar Spine Complete  02/25/2014   CLINICAL DATA:  Per [facility], pt stood up from sitting down and passed out, pt called ems this morning, they did a 12 lead this morning and it was fine, was hypotensive in the 80's. Pt tried to get up and walk around this evening and c/o left foot pain and lumbar back pain. Pt passed SCCA by ems. BP by ems now 144/90, 72 HR, 18 RR, 97% room air. Pt is AAOX4, in NAD.  EXAM: LUMBAR SPINE - COMPLETE 4+ VIEW  COMPARISON:  None.  FINDINGS: Mild a moderate curvature convex to the right. There is moderate diffuse decreased bone mineralization. Vertebral body alignment and heights are within normal, although the degree of osteopenia makes evaluation of the vertebral body heights on the lateral film somewhat difficult. There is mild a moderate spondylosis throughout the lumbar spine. Mild disc space narrowing at the L5-S1 level. Moderate calcified plaque over the abdominal aorta and iliac arteries. There are mild degenerative changes of the hips.  IMPRESSION: No acute findings.  Mild a moderate spondylosis.  Mild disc disease at the L5-S1 level.   Electronically Signed   By: Marin Olp M.D.   On: 02/25/2014 20:07   Dg Ankle Complete Left  02/25/2014   CLINICAL DATA:  Left foot pain after a syncopal episode today when standing up from sitting down.  EXAM: LEFT ANKLE COMPLETE - 3+ VIEW  COMPARISON:  None.  FINDINGS: Diffuse osteopenia. Moderate sized inferior calcaneal spur. Atheromatous arterial calcifications. Possible nondisplaced fracture in the distal medial malleolus.  IMPRESSION: Possible nondisplaced fracture in the distal medial malleolus.   Electronically Signed   By: Enrique Sack M.D.   On: 02/25/2014 20:01   Ct Head Wo Contrast  02/25/2014   CLINICAL DATA:  Fall with back pain and foot pain.  Headache and neck pain. Initial encounter  EXAM: CT HEAD WITHOUT CONTRAST  CT CERVICAL SPINE WITHOUT CONTRAST  TECHNIQUE: Multidetector CT imaging of the head and cervical spine was performed following the standard protocol without intravenous contrast. Multiplanar CT image reconstructions of the  cervical spine were also generated.  COMPARISON:  05/28/2005  FINDINGS: CT HEAD FINDINGS  Skull and Sinuses:Negative for fracture or destructive process. The mastoids, middle ears, and imaged paranasal sinuses are clear.  Orbits: No acute abnormality.  Brain: No evidence of acute abnormality, such as acute infarction, hemorrhage, hydrocephalus, or mass lesion/mass effect. There is generalized brain atrophy which is progressed from 2007. Mild chronic still vessel disease with ischemic gliosis noted in the left external capsules and around the lateral ventricles. Coarse calcification again noted in the left parietal region.  CT CERVICAL SPINE FINDINGS  There is a compression deformity at the T1 vertebral body which results in mild height loss (~30%). The fracture is new from 2007. No acute fracture line or surrounding fat infiltration is visible, but the fracture is still age indeterminate. Concavity of the T2 superior endplate which is also new from 2007. Smooth appearance favors remote injury. There is no gross cervical canal hematoma or prevertebral edema.  Diffuse spondylosis which is fairly typical of age. There is no evidence of high-grade canal stenosis.  IMPRESSION: 1. No acute intracranial injury. 2. Age indeterminate T1 compression fracture with mild height loss. 3. Generalized brain atrophy, progressed since 2007.   Electronically Signed   By: Jorje Guild M.D.   On: 02/25/2014 19:59   Ct Cervical Spine Wo Contrast  02/25/2014   CLINICAL DATA:  Fall with back pain and foot pain. Headache and neck pain. Initial encounter  EXAM: CT HEAD WITHOUT CONTRAST  CT CERVICAL SPINE WITHOUT CONTRAST  TECHNIQUE:  Multidetector CT imaging of the head and cervical spine was performed following the standard protocol without intravenous contrast. Multiplanar CT image reconstructions of the cervical spine were also generated.  COMPARISON:  05/28/2005  FINDINGS: CT HEAD FINDINGS  Skull and Sinuses:Negative for fracture or destructive process. The mastoids, middle ears, and imaged paranasal sinuses are clear.  Orbits: No acute abnormality.  Brain: No evidence of acute abnormality, such as acute infarction, hemorrhage, hydrocephalus, or mass lesion/mass effect. There is generalized brain atrophy which is progressed from 2007. Mild chronic still vessel disease with ischemic gliosis noted in the left external capsules and around the lateral ventricles. Coarse calcification again noted in the left parietal region.  CT CERVICAL SPINE FINDINGS  There is a compression deformity at the T1 vertebral body which results in mild height loss (~30%). The fracture is new from 2007. No acute fracture line or surrounding fat infiltration is visible, but the fracture is still age indeterminate. Concavity of the T2 superior endplate which is also new from 2007. Smooth appearance favors remote injury. There is no gross cervical canal hematoma or prevertebral edema.  Diffuse spondylosis which is fairly typical of age. There is no evidence of high-grade canal stenosis.  IMPRESSION: 1. No acute intracranial injury. 2. Age indeterminate T1 compression fracture with mild height loss. 3. Generalized brain atrophy, progressed since 2007.   Electronically Signed   By: Jorje Guild M.D.   On: 02/25/2014 19:59   US Aorta  02/25/2014   CLINICAL DATA:  Back pain. Syncope. Evaluate for abdominal aortic aneurysm.  EXAM: ULTRASOUND OF ABDOMINAL AORTA  TECHNIQUE: Ultrasound examination of the abdominal aorta was performed to evaluate for abdominal aortic aneurysm.  COMPARISON:  05/22/2003  FINDINGS: Abdominal Aorta  No aneurysm identified.  Atherosclerotic  changes.  Maximum AP  Diameter:  2.1  Maximum TRV  Diameter: 2.1  IMPRESSION: No abdominal aortic aneurysm.  Moderate atherosclerotic changes.   Electronically Signed   By: Elta Guadeloupe  Gallerani M.D.   On: 02/25/2014 20:24   Dg Foot Complete Left  02/25/2014   CLINICAL DATA:  Fall with back and foot pain.  Initial encounter  EXAM: LEFT FOOT - COMPLETE 3+ VIEW  COMPARISON:  None.  FINDINGS: Transverse, minimally displaced fractures of the third and fourth metatarsal necks. No joint malalignment.  Advanced diffuse osteopenia. Mild hallux valgus with first MTP osteoarthritis and small bony bunion. Chronic absence of the fifth proximal interphalangeal joint, likely postoperative.  IMPRESSION: Third and fourth metatarsal neck fractures.   Electronically Signed   By: Jorje Guild M.D.   On: 02/25/2014 20:02    Review of Systems  Constitutional: Positive for weight loss. Negative for fever, chills, malaise/fatigue and diaphoresis.  Eyes: Negative for discharge and redness.  Respiratory: Negative for shortness of breath and wheezing.   Cardiovascular: Negative for leg swelling.  Gastrointestinal: Negative for nausea, vomiting, diarrhea and constipation.  Musculoskeletal: Positive for falls. Negative for myalgias and joint pain.       Pt denies px about LLE  Skin: Negative.   Neurological: Negative.  Negative for weakness.   Blood pressure 94/50, pulse 74, temperature 99.7 F (37.6 C), temperature source Oral, resp. rate 20, height $RemoveBe'5\' 4"'[id number]$  (1.626 m), weight 53.524 kg (118 lb), SpO2 93 %. Physical Exam  Constitutional: She is oriented to person, place, and time. She appears well-developed and well-nourished. No distress.  HENT:  Head: Normocephalic.  Mouth/Throat: No oropharyngeal exudate.  Bruising L cheek  Eyes: Conjunctivae and EOM are normal. Pupils are equal, round, and reactive to light. Left eye exhibits no discharge.  Respiratory: Effort normal and breath sounds normal. No stridor.   Musculoskeletal: She exhibits no edema or tenderness.  Full ROM LLE, mild swelling lateral midfoot. Minimal echymossis. nontender to palpation, 2+ DPP = BIL cap refil <2sec  Neurological: She is alert and oriented to person, place, and time.  Skin: Skin is warm and dry. She is not diaphoretic. No erythema.  Psychiatric: She has a normal mood and affect. Her behavior is normal. Judgment and thought content normal.     Assessment/Plan: Left non displaced distal medial malleolus & 3rd and 4th metatarsal neck fx, stable  -CAM boot, WBAT  -F/U in office 1 week  -Ice and elevate  -Pt denies px, no need for additional px medication mngmt -Med team is primary, following additional pathology Syncope, hypotn, PAF, hypothyroid, etc  Jasmine [name] 02/27/2014, 1:52 PM

## 2014-02-27 NOTE — Care Management Note (Signed)
    Page 1 of 2   02/27/2014     3:06:14 PM CARE MANAGEMENT NOTE 02/27/2014  Patient:  Jasmine [name],Jasmine [name]   Account Number:  [id number]  Date Initiated:  02/27/2014  Documentation initiated by:  Jamira Barfuss  Subjective/Objective Assessment:   Pt adm on 02/25/14 with syncope, fall, r/o UTI.  PTA, pt resides at home with daughter.     Action/Plan:   Pt for dc home today with daughter.  PT/OT recommending HH follow up, and pt agreeable.  [facility].  Will need RW for home as well; [facility]   Anticipated DC Date:  02/27/2014   Anticipated DC Plan:  HOME W HOME HEALTH SERVICES      DC Planning Services  CM consult      [facility]   Choice offered to / List presented to:  C-1 Patient   DME arranged  [facility]      DME agency  [facility]     HH arranged  HH-2 PT  HH-3 OT      [facility]  [facility]   Status of service:  Completed, signed off Medicare Important Message given?  NA - LOS <3 / Initial given by admissions (If response is "NO", the following Medicare IM given date fields will be blank) Date Medicare IM given:   Medicare IM given by:   Date Additional Medicare IM given:   Additional Medicare IM given by:    Discharge Disposition:  HOME W HOME HEALTH SERVICES  Per UR Regulation:  Reviewed for med. necessity/level of care/duration of stay  If discussed at Long Length of Stay Meetings, dates discussed:    Comments:

## 2014-02-27 NOTE — Progress Notes (Signed)
Pt and daughter given d/c instructions; IV and tele monitor d/c; pt and daughter verbalized understanding; pt to d/c home with daughter.

## 2014-02-27 NOTE — Discharge Summary (Signed)
Physician Discharge Summary  Jasmine [name]:[id number] DOB: Sep 01, 1922 DOA: 02/25/2014  PCP: Nicki Reaper, NP  Admit date: 02/25/2014 Discharge date: 02/27/2014  Time spent: Less than 30 minutes  Recommendations for Outpatient Follow-up:  1. Dr. Nicki Reaper NP, PCP: has prior appointment on 03/01/14 at 3:15 PM. 2. Dr. Nanetta Batty, Cardiology in 2 weeks. 3. Home Health PT, OT & Rolling walker with 5" wheels. 4. B/L LE thigh high compression stockings 5. Dr. Estill Bamberg, Orthopedics in 1 week. Continue left lower extremity CAM boot. WBAT on left leg.  Discharge Diagnoses:  Principal Problem:   Syncope Active Problems:   Hypothyroidism   At risk for falling   CAD - CABG '97, cath OK 2005, Myoview low risk 12/12   HTN (hypertension)   PAF (paroxysmal atrial fibrillation)   UTI (urinary tract infection)   Faintness   Orthostatic hypotension   Discharge Condition: Improved & Stable  Diet recommendation: Heart Healthy diet.  Filed Weights   02/25/14 2240 02/26/14 0500 02/27/14 0435  Weight: 53.298 kg (117 lb 8 oz) 53.3 kg (117 lb 8.1 oz) 53.524 kg (118 lb)    History of present illness:  80 year old female patient with history of HTN, A. fib-not on anticoagulation probably secondary to fall risk, hypothyroidism, GERD, CAD s/p CABG, CEA, frequent falls, presented with an episode of syncope and fall at home. EMS found her with SBP 80 mmHg. In the ED, she was found to have fracture of left distal medial malleolus and third and 4 metatarsal neck. She gives history of intermittent dizziness but no prior syncope.  Hospital Course:    1. Syncope: Secondary to hypotension/orthostatic hypotension.CT head negative for acute findings. EKG without acute findings. Troponins 2 negative. Briefly hydrated with IV fluids. PT and OT evaluation - recommend home health PT, OT and rolling walker with 5 inch wheels. 2-D echo shows normal EF. Carotid Doppler results as below-unremarkable.  Syncope most likely secondary to orthostatic hypotension and management to address same. 2. Hypotension/orthostatic hypotension: No reported GI fluid losses, fever or dysuria. Continue prior dose of Cardizem& beta blockers for PAF. Continues to be orthostatic but not symptomatic today. ARB which was reduced to half the dose yesterday will be discontinued for now and blood pressures can be reassessed during outpatient evaluation with PCP. Will place bilateral thigh-high compression stockings. Patient and family have been counseled extensively regarding orthostatic maneuvers when they verbalized understanding. 3. Left non displaced distal medial malleolus and third and fourth metatarsal neck fractures: Patient has Cam boot. Discussed with Dr. Marshell Levan PA on 11/9 who evaluated patient's imaging studies and recommended weightbearing as tolerated, continue CAM boot in left leg and outpatient follow-up with them in 1 week. Patient denies pain on weightbearing or with ambulation with PT. 4. PAF: With controlled ventricular rate. Not anticoagulation candidate secondary to fall risk. Continue aspirin, Cardizem and beta blockers. 5. Essential hypertension: Management as above. ARB discontinued secondary to orthostatic hypotension. Controlled. 6. Hypothyroidism: Continue Synthroid. Recent TSH: 2.196 in September. 7. Possible UTI: patient was empirically treated with IV Rocephin. Unfortunately urine cultures were not sent. Will discharge on oral Ceftin to complete total one-week course. 8. DO NOT RESUSCITATE 9. CAD - CABG '97, cath OK 2005, Myoview low risk 12/12 10. Chronic Thrombocytopenia: Stable. 11. T1 compression fracture: Incidentally found on CT head/neck. Age indeterminate. Seems asymptomatic.  Consultations:  none  Procedures:  2-D echo 02/26/14: Study Conclusions  - Left ventricle: The cavity size was normal. Systolic function was normal. The estimated  ejection fraction was in the range of  55% to 60%. Wall motion was normal; there were no regional wall motion abnormalities. - Aortic valve: Cusp separation was mildly reduced. - Left atrium: The atrium was mildly dilated. - Right atrium: The atrium was moderately dilated. - Tricuspid valve: There was moderate regurgitation. - Pulmonary arteries: Systolic pressure was moderately to severely increased. PA peak pressure: 66 mm Hg (S).  Carotid Dopplers completed.   Preliminary report: 1-39% ICA stenosis. Right CEA patent. Vertebral artery flow is antegrade.   Discharge Exam:  Complaints:  patient denies complaints and is anxious to go home. Denies dizziness or lightheadedness. No chest pain, palpitations or dyspnea. States that she ambulated with PT without pain in the left leg.  Filed Vitals:   02/27/14 1033 02/27/14 1231 02/27/14 1232 02/27/14 1236  BP: 96/52 124/58 110/50 94/50  Pulse: 62 70 74 74  Temp:      TempSrc:      Resp:      Height:      Weight:      SpO2:      temperature 99.64F, respiratory rate 20/m and oxygen saturation 93% on room air.  General exam: pleasant elderly female lying comfortably supine in bed and reading a newspaper. Respiratory system: Clear. No increased work of breathing. Cardiovascular system: S1 & S2 heard, RRR. No JVD, murmurs, gallops, clicks or pedal edema. Telemetry: A. fib with controlled ventricular rate. Gastrointestinal system: Abdomen is nondistended, soft and nontender. Normal bowel sounds heard. Central nervous system: Alert and oriented. No focal neurological deficits. Extremities: Symmetric 5 x 5 power. Left lower extremity with cam boot.  Discharge Instructions      Discharge Instructions    Call MD for:  persistant dizziness or light-headedness    Complete by:  As directed      Call MD for:    Complete by:  As directed   Passing out.     Diet - low sodium heart healthy    Complete by:  As directed      Increase activity slowly    Complete by:  As  directed             Medication List    STOP taking these medications        losartan 100 MG tablet  Commonly known as:  COZAAR      TAKE these medications        acetaminophen 500 MG tablet  Commonly known as:  TYLENOL  Take 500 mg by mouth every 6 (six) hours as needed for mild pain. For pain     aspirin 81 MG tablet  Take 81 mg by mouth daily.     atorvastatin 80 MG tablet  Commonly known as:  LIPITOR  TAKE 1 TABLET BY MOUTH DAILY     bisoprolol 5 MG tablet  Commonly known as:  ZEBETA  Take 1 tablet (5 mg total) by mouth once daily.     CALCIUM 600-D 600-400 MG-UNIT per tablet  Generic drug:  Calcium Carbonate-Vitamin D  Take 1 tablet by mouth daily.     cefUROXime 250 MG tablet  Commonly known as:  CEFTIN  Take 1 tablet (250 mg total) by mouth 2 (two) times daily with a meal.     clonazePAM 1 MG tablet  Commonly known as:  KLONOPIN  Take a half tablet when you awaken in the morning take a half tablet at 6:00 PM and take a whole tablet  at bedtime  cyanocobalamin 500 MCG tablet  Take 500 mcg by mouth daily.     diltiazem 120 MG 24 hr capsule  Commonly known as:  TIAZAC  TAKE 1 CAPSULE DAILY     levothyroxine 88 MCG tablet  Commonly known as:  SYNTHROID, LEVOTHROID  TAKE 1 TABLET DAILY     lubiprostone 24 MCG capsule  Commonly known as:  AMITIZA  Take 1 capsule (24 mcg total) by mouth 2 (two) times daily with a meal.     mirtazapine 45 MG tablet  Commonly known as:  REMERON  TAKE 1 TABLET AT BEDTIME     multivitamin capsule  Take 1 capsule by mouth daily.     omeprazole 20 MG capsule  Commonly known as:  PRILOSEC  TAKE 1 CAPSULE DAILY     QUEtiapine 50 MG tablet  Commonly known as:  SEROQUEL  TAKE 1 TABLET AT BEDTIME     traMADol 50 MG tablet  Commonly known as:  ULTRAM  Take 1 tablet (50 mg total) by mouth every 8 (eight) hours as needed for pain.     vitamin E 400 UNIT capsule  Take 400 Units by mouth daily.       Follow-up  Information    Follow up with Runell GessBERRY,JONATHAN J, MD. Schedule an appointment as soon as possible for a visit in 2 weeks.   Specialty:  Cardiology   Contact information:   [address] [place] [address] [phone]       Follow up with Emilee HeroUMONSKI,MARK LEONARD, MD. Schedule an appointment as soon as possible for a visit in 1 week.   Specialty:  Orthopedic Surgery   Contact information:   [address] [place] [address] [phone]       Follow up with Nicki ReaperBAITY, REGINA, NP On 03/01/2014.   Specialty:  Internal Medicine   Why:  3:15 PM.   Contact information:   [address] [place] [address] [phone]        The results of significant diagnostics from this hospitalization (including imaging, microbiology, ancillary and laboratory) are listed below for reference.    Significant Diagnostic Studies: Dg Lumbar Spine Complete  02/25/2014   CLINICAL DATA:  Per [facility], pt stood up from sitting down and passed out, pt called ems this morning, they did a 12 lead this morning and it was fine, was hypotensive in the 80's. Pt tried to get up and walk around this evening and c/o left foot pain and lumbar back pain. Pt passed SCCA by ems. BP by ems now 144/90, 72 HR, 18 RR, 97% room air. Pt is AAOX4, in NAD.  EXAM: LUMBAR SPINE - COMPLETE 4+ VIEW  COMPARISON:  None.  FINDINGS: Mild a moderate curvature convex to the right. There is moderate diffuse decreased bone mineralization. Vertebral body alignment and heights are within normal, although the degree of osteopenia makes evaluation of the vertebral body heights on the lateral film somewhat difficult. There is mild a moderate spondylosis throughout the lumbar spine. Mild disc space narrowing at the L5-S1 level. Moderate calcified plaque over the abdominal aorta and iliac arteries. There are mild degenerative changes of the hips.  IMPRESSION: No acute findings.  Mild a moderate spondylosis.  Mild disc  disease at the L5-S1 level.   Electronically Signed   By: Elberta Fortisaniel  Boyle M.D.   On: 02/25/2014 20:07   Dg Ankle Complete Left  02/25/2014   CLINICAL DATA:  Left foot pain after a syncopal  episode today when standing up from sitting down.  EXAM: LEFT ANKLE COMPLETE - 3+ VIEW  COMPARISON:  None.  FINDINGS: Diffuse osteopenia. Moderate sized inferior calcaneal spur. Atheromatous arterial calcifications. Possible nondisplaced fracture in the distal medial malleolus.  IMPRESSION: Possible nondisplaced fracture in the distal medial malleolus.   Electronically Signed   By: Gordan Payment M.D.   On: 02/25/2014 20:01   Ct Head Wo Contrast  02/25/2014   CLINICAL DATA:  Fall with back pain and foot pain. Headache and neck pain. Initial encounter  EXAM: CT HEAD WITHOUT CONTRAST  CT CERVICAL SPINE WITHOUT CONTRAST  TECHNIQUE: Multidetector CT imaging of the head and cervical spine was performed following the standard protocol without intravenous contrast. Multiplanar CT image reconstructions of the cervical spine were also generated.  COMPARISON:  05/28/2005  FINDINGS: CT HEAD FINDINGS  Skull and Sinuses:Negative for fracture or destructive process. The mastoids, middle ears, and imaged paranasal sinuses are clear.  Orbits: No acute abnormality.  Brain: No evidence of acute abnormality, such as acute infarction, hemorrhage, hydrocephalus, or mass lesion/mass effect. There is generalized brain atrophy which is progressed from 2007. Mild chronic still vessel disease with ischemic gliosis noted in the left external capsules and around the lateral ventricles. Coarse calcification again noted in the left parietal region.  CT CERVICAL SPINE FINDINGS  There is a compression deformity at the T1 vertebral body which results in mild height loss (~30%). The fracture is new from 2007. No acute fracture line or surrounding fat infiltration is visible, but the fracture is still age indeterminate. Concavity of the T2 superior endplate which  is also new from 2007. Smooth appearance favors remote injury. There is no gross cervical canal hematoma or prevertebral edema.  Diffuse spondylosis which is fairly typical of age. There is no evidence of high-grade canal stenosis.  IMPRESSION: 1. No acute intracranial injury. 2. Age indeterminate T1 compression fracture with mild height loss. 3. Generalized brain atrophy, progressed since 2007.   Electronically Signed   By: Tiburcio Pea M.D.   On: 02/25/2014 19:59   Ct Cervical Spine Wo Contrast  02/25/2014   CLINICAL DATA:  Fall with back pain and foot pain. Headache and neck pain. Initial encounter  EXAM: CT HEAD WITHOUT CONTRAST  CT CERVICAL SPINE WITHOUT CONTRAST  TECHNIQUE: Multidetector CT imaging of the head and cervical spine was performed following the standard protocol without intravenous contrast. Multiplanar CT image reconstructions of the cervical spine were also generated.  COMPARISON:  05/28/2005  FINDINGS: CT HEAD FINDINGS  Skull and Sinuses:Negative for fracture or destructive process. The mastoids, middle ears, and imaged paranasal sinuses are clear.  Orbits: No acute abnormality.  Brain: No evidence of acute abnormality, such as acute infarction, hemorrhage, hydrocephalus, or mass lesion/mass effect. There is generalized brain atrophy which is progressed from 2007. Mild chronic still vessel disease with ischemic gliosis noted in the left external capsules and around the lateral ventricles. Coarse calcification again noted in the left parietal region.  CT CERVICAL SPINE FINDINGS  There is a compression deformity at the T1 vertebral body which results in mild height loss (~30%). The fracture is new from 2007. No acute fracture line or surrounding fat infiltration is visible, but the fracture is still age indeterminate. Concavity of the T2 superior endplate which is also new from 2007. Smooth appearance favors remote injury. There is no gross cervical canal hematoma or prevertebral edema.   Diffuse spondylosis which is fairly typical of age. There is no evidence of  high-grade canal stenosis.  IMPRESSION: 1. No acute intracranial injury. 2. Age indeterminate T1 compression fracture with mild height loss. 3. Generalized brain atrophy, progressed since 2007.   Electronically Signed   By: Tiburcio PeaJonathan  Watts M.D.   On: 02/25/2014 19:59   [place] Aorta  02/25/2014   CLINICAL DATA:  Back pain. Syncope. Evaluate for abdominal aortic aneurysm.  EXAM: ULTRASOUND OF ABDOMINAL AORTA  TECHNIQUE: Ultrasound examination of the abdominal aorta was performed to evaluate for abdominal aortic aneurysm.  COMPARISON:  05/22/2003  FINDINGS: Abdominal Aorta  No aneurysm identified.  Atherosclerotic changes.  Maximum AP  Diameter:  2.1  Maximum TRV  Diameter: 2.1  IMPRESSION: No abdominal aortic aneurysm.  Moderate atherosclerotic changes.   Electronically Signed   By: Loralie ChampagneMark  Gallerani M.D.   On: 02/25/2014 20:24   Dg Foot Complete Left  02/25/2014   CLINICAL DATA:  Fall with back and foot pain.  Initial encounter  EXAM: LEFT FOOT - COMPLETE 3+ VIEW  COMPARISON:  None.  FINDINGS: Transverse, minimally displaced fractures of the third and fourth metatarsal necks. No joint malalignment.  Advanced diffuse osteopenia. Mild hallux valgus with first MTP osteoarthritis and small bony bunion. Chronic absence of the fifth proximal interphalangeal joint, likely postoperative.  IMPRESSION: Third and fourth metatarsal neck fractures.   Electronically Signed   By: Tiburcio PeaJonathan  Watts M.D.   On: 02/25/2014 20:02    Microbiology: No results found for this or any previous visit (from the past 240 hour(s)).   Labs: Basic Metabolic Panel:  Recent Labs Lab 02/25/14 1918 02/26/14 0400  NA 137 136*  K 4.2 4.3  CL 97 99  CO2 27 26  GLUCOSE 136* 113*  BUN 12 12  CREATININE 1.09 1.03  CALCIUM 9.2 8.5   Liver Function Tests:  Recent Labs Lab 02/25/14 1918 02/26/14 0400  AST 28 25  ALT 21 16  ALKPHOS 113 91  BILITOT 0.7 0.5   PROT 6.6 5.7*  ALBUMIN 3.6 2.9*   No results for input(s): LIPASE, AMYLASE in the last 168 hours. No results for input(s): AMMONIA in the last 168 hours. CBC:  Recent Labs Lab 02/25/14 1918 02/26/14 0400  WBC 5.2 4.5  NEUTROABS 4.1  --   HGB 13.8 12.6  HCT 42.9 39.1  MCV 92.3 92.4  PLT 119* 109*   Cardiac Enzymes:  Recent Labs Lab 02/25/14 1918 02/26/14 0400  TROPONINI <0.30 <0.30   BNP: BNP (last 3 results) No results for input(s): PROBNP in the last 8760 hours. CBG: No results for input(s): GLUCAP in the last 168 hours.      Signed:  Marcellus ScottHONGALGI,Razan Siler, MD, FACP, FHM. [facility] Pager [phone]  If 7PM-7AM, please contact night-coverage [email or web address] Password [email or web address] 02/27/2014, 1:54 PM

## 2014-02-28 ENCOUNTER — Ambulatory Visit: Payer: Medicare Other | Admitting: Family Medicine

## 2014-02-28 ENCOUNTER — Telehealth: Payer: Self-pay | Admitting: Cardiovascular Disease

## 2014-02-28 DIAGNOSIS — I482 Chronic atrial fibrillation: Secondary | ICD-10-CM | POA: Diagnosis not present

## 2014-02-28 DIAGNOSIS — S8255XD Nondisplaced fracture of medial malleolus of left tibia, subsequent encounter for closed fracture with routine healing: Secondary | ICD-10-CM | POA: Diagnosis not present

## 2014-02-28 DIAGNOSIS — S92342D Displaced fracture of fourth metatarsal bone, left foot, subsequent encounter for fracture with routine healing: Secondary | ICD-10-CM | POA: Diagnosis not present

## 2014-02-28 DIAGNOSIS — N390 Urinary tract infection, site not specified: Secondary | ICD-10-CM | POA: Diagnosis not present

## 2014-02-28 DIAGNOSIS — S92332D Displaced fracture of third metatarsal bone, left foot, subsequent encounter for fracture with routine healing: Secondary | ICD-10-CM | POA: Diagnosis not present

## 2014-02-28 DIAGNOSIS — E039 Hypothyroidism, unspecified: Secondary | ICD-10-CM | POA: Diagnosis not present

## 2014-02-28 DIAGNOSIS — I951 Orthostatic hypotension: Secondary | ICD-10-CM | POA: Diagnosis not present

## 2014-02-28 DIAGNOSIS — W19XXXD Unspecified fall, subsequent encounter: Secondary | ICD-10-CM | POA: Diagnosis not present

## 2014-02-28 NOTE — Telephone Encounter (Signed)
Closed encounter °

## 2014-03-01 ENCOUNTER — Ambulatory Visit (INDEPENDENT_AMBULATORY_CARE_PROVIDER_SITE_OTHER): Payer: Medicare Other | Admitting: Internal Medicine

## 2014-03-01 ENCOUNTER — Encounter (INDEPENDENT_AMBULATORY_CARE_PROVIDER_SITE_OTHER): Payer: Self-pay

## 2014-03-01 ENCOUNTER — Encounter: Payer: Self-pay | Admitting: Internal Medicine

## 2014-03-01 VITALS — BP 112/68 | HR 69 | Temp 97.3°F | Wt 118.5 lb

## 2014-03-01 DIAGNOSIS — I480 Paroxysmal atrial fibrillation: Secondary | ICD-10-CM

## 2014-03-01 DIAGNOSIS — G4700 Insomnia, unspecified: Secondary | ICD-10-CM | POA: Diagnosis not present

## 2014-03-01 DIAGNOSIS — I10 Essential (primary) hypertension: Secondary | ICD-10-CM | POA: Diagnosis not present

## 2014-03-01 DIAGNOSIS — K2270 Barrett's esophagus without dysplasia: Secondary | ICD-10-CM | POA: Diagnosis not present

## 2014-03-01 DIAGNOSIS — E039 Hypothyroidism, unspecified: Secondary | ICD-10-CM | POA: Diagnosis not present

## 2014-03-01 DIAGNOSIS — I2510 Atherosclerotic heart disease of native coronary artery without angina pectoris: Secondary | ICD-10-CM | POA: Diagnosis not present

## 2014-03-01 DIAGNOSIS — E785 Hyperlipidemia, unspecified: Secondary | ICD-10-CM | POA: Diagnosis not present

## 2014-03-01 DIAGNOSIS — F411 Generalized anxiety disorder: Secondary | ICD-10-CM

## 2014-03-01 NOTE — Progress Notes (Signed)
Pre visit review using our clinic review tool, if applicable. No additional management support is needed unless otherwise documented below in the visit note. 

## 2014-03-01 NOTE — Progress Notes (Signed)
HPI  Jasmine [name] presents to the clinic today to establish care. Jasmine [name] is transferring care from Dr. Lovell SheehanJenkins.   Flu: 2013, bad reaction, was told not to have one again Pneumonia Vaccine: 2011 Tetanus:  Zostovax: Colonoscopy: many years ago Mammogram: 2010 Bone Density: Vision Screening: Dentist:  Past Medical History  Diagnosis Date  . Insomnia due to medical condition   . Anxiety   . Hyperlipidemia   . Hypertension   . Hypothyroidism   . Low back pain   . Left wrist fracture 09/2007  . Fracture of right shoulder 02/2008  . GERD (gastroesophageal reflux disease)   . Barrett's esophagus   . Hemorrhoids, external   . CAD (coronary artery disease) of artery bypass graft 1995  . Hiatal hernia   . History of colon polyps   . Atrial fibrillation with RVR     Current Outpatient Prescriptions  Medication Sig Dispense Refill  . acetaminophen (TYLENOL) 500 MG tablet Take 500 mg by mouth every 6 (six) hours as needed for mild pain. For pain    . aspirin 81 MG tablet Take 81 mg by mouth daily.      Marland Kitchen. atorvastatin (LIPITOR) 80 MG tablet TAKE 1 TABLET BY MOUTH DAILY (Patient taking differently: Take 80 mg by mouth daily. TAKE 1 TABLET BY MOUTH DAILY) 90 tablet 3  . bisoprolol (ZEBETA) 5 MG tablet Take 1 tablet (5 mg total) by mouth once daily.    . Calcium Carbonate-Vitamin D (CALCIUM 600-D) 600-400 MG-UNIT per tablet Take 1 tablet by mouth daily.     . cefUROXime (CEFTIN) 250 MG tablet Take 1 tablet (250 mg total) by mouth 2 (two) times daily with a meal. 10 tablet 0  . clonazePAM (KLONOPIN) 1 MG tablet Take a half tablet when you awaken in the morning take a half tablet at 6:00 PM and take a whole tablet  at bedtime (Patient taking differently: Take 1 mg by mouth every morning. Take a half tablet when you awaken in the morning take a half tablet at 6:00 PM and take a whole tablet  at bedtime) 180 tablet 1  . cyanocobalamin 500 MCG tablet Take 500 mcg by mouth daily.      Marland Kitchen. diltiazem (TIAZAC) 120  MG 24 hr capsule TAKE 1 CAPSULE DAILY 90 capsule 2  . levothyroxine (SYNTHROID, LEVOTHROID) 88 MCG tablet TAKE 1 TABLET DAILY 90 tablet 2  . lubiprostone (AMITIZA) 24 MCG capsule Take 1 capsule (24 mcg total) by mouth 2 (two) times daily with a meal. 180 capsule 3  . mirtazapine (REMERON) 45 MG tablet TAKE 1 TABLET AT BEDTIME (Patient taking differently: Take 45 mg by mouth at bedtime. TAKE 1 TABLET AT BEDTIME) 90 tablet 3  . Multiple Vitamin (MULTIVITAMIN) capsule Take 1 capsule by mouth daily.      Marland Kitchen. omeprazole (PRILOSEC) 20 MG capsule TAKE 1 CAPSULE DAILY 90 capsule 3  . QUEtiapine (SEROQUEL) 50 MG tablet TAKE 1 TABLET AT BEDTIME 90 tablet 2  . traMADol (ULTRAM) 50 MG tablet Take 1 tablet (50 mg total) by mouth every 8 (eight) hours as needed for pain. 150 tablet 3  . vitamin E 400 UNIT capsule Take 400 Units by mouth daily.       No current facility-administered medications for this visit.    Allergies  Allergen Reactions  . Codeine   . Meperidine Hcl   . Oxycodone-Acetaminophen     Family History  Problem Relation Age of Onset  . Diabetes Son   .  Heart disease Mother   . Heart disease Father     History   Social History  . Marital Status: Widowed    Spouse Name: N/A    Number of Children: N/A  . Years of Education: N/A   Occupational History  . Not on file.   Social History Main Topics  . Smoking status: Former Smoker    Quit date: 04/20/1968  . Smokeless tobacco: Never Used     Comment: QUIT IN 1970  . Alcohol Use: No     Comment: STOPPED IN 1970  . Drug Use: No  . Sexual Activity: Not Currently   Other Topics Concern  . Not on file   Social History Narrative    ROS:  Constitutional: Denies fever, malaise, fatigue, headache or abrupt weight changes.  HEENT: Denies eye pain, eye redness, ear pain, ringing in the ears, wax buildup, runny nose, nasal congestion, bloody nose, or sore throat. Respiratory: Denies difficulty breathing, shortness of breath,  cough or sputum production.   Cardiovascular: Denies chest pain, chest tightness, palpitations or swelling in the hands or feet.  Gastrointestinal: Denies abdominal pain, bloating, constipation, diarrhea or blood in the stool.  GU: Denies frequency, urgency, pain with urination, blood in urine, odor or discharge. Musculoskeletal: Denies decrease in range of motion, difficulty with gait, muscle pain or joint pain and swelling.  Skin: Denies redness, rashes, lesions or ulcercations.  Neurological: Denies dizziness, difficulty with memory, difficulty with speech or problems with balance and coordination.   No other specific complaints in a complete review of systems (except as listed in HPI above).  PE:  Pulse 69  Temp(Src) 97.3 F (36.3 C) (Oral)  Wt 118 lb 8 oz (53.751 kg)  SpO2 98% Wt Readings from Last 3 Encounters:  03/01/14 118 lb 8 oz (53.751 kg)  02/27/14 118 lb (53.524 kg)  01/15/14 117 lb 12.8 oz (53.434 kg)    General: Appears their stated age, well developed, well nourished in NAD. HEENT: Head: normal shape and size; Eyes: sclera white, no icterus, conjunctiva pink, PERRLA and EOMs intact; Ears: Tm's gray and intact, normal light reflex; Nose: mucosa pink and moist, septum midline; Throat/Mouth: Teeth present, mucosa pink and moist, no lesions or ulcerations noted.  Neck: Normal range of motion. Neck supple, trachea midline. No massses, lumps or thyromegaly present.  Cardiovascular: Normal rate and rhythm. S1,S2 noted.  No murmur, rubs or gallops noted. No JVD or BLE edema. No carotid bruits noted. Pulmonary/Chest: Normal effort and positive vesicular breath sounds. No respiratory distress. No wheezes, rales or ronchi noted.  Abdomen: Soft and nontender. Normal bowel sounds, no bruits noted. No distention or masses noted. Liver, spleen and kidneys non palpable. Musculoskeletal: Normal range of motion. No signs of joint swelling. No difficulty with gait.  Neurological: Alert and  oriented. Cranial nerves II-XII intact. Coordination normal. +DTRs bilaterally. Psychiatric: Mood and affect normal. Behavior is normal. Judgment and thought content normal.   EKG:  BMET    Component Value Date/Time   NA 136* 02/26/2014 0400   K 4.3 02/26/2014 0400   CL 99 02/26/2014 0400   CO2 26 02/26/2014 0400   GLUCOSE 113* 02/26/2014 0400   BUN 12 02/26/2014 0400   CREATININE 1.03 02/26/2014 0400   CREATININE 1.00 01/15/2014 1528   CALCIUM 8.5 02/26/2014 0400   GFRNONAA 47* 02/26/2014 0400   GFRAA 54* 02/26/2014 0400    Lipid Panel     Component Value Date/Time   CHOL 137 08/02/2013 1532  TRIG 84 08/02/2013 1532   HDL 48 08/02/2013 1532   CHOLHDL 2.9 08/02/2013 1532   VLDL 17 08/02/2013 1532   LDLCALC 72 08/02/2013 1532    CBC    Component Value Date/Time   WBC 4.5 02/26/2014 0400   RBC 4.23 02/26/2014 0400   HGB 12.6 02/26/2014 0400   HCT 39.1 02/26/2014 0400   PLT 109* 02/26/2014 0400   MCV 92.4 02/26/2014 0400   MCH 29.8 02/26/2014 0400   MCHC 32.2 02/26/2014 0400   RDW 13.7 02/26/2014 0400   LYMPHSABS 0.7 02/25/2014 1918   MONOABS 0.4 02/25/2014 1918   EOSABS 0.0 02/25/2014 1918   BASOSABS 0.0 02/25/2014 1918    Hgb A1C No results found for: HGBA1C   Assessment and Plan:

## 2014-03-02 DIAGNOSIS — N390 Urinary tract infection, site not specified: Secondary | ICD-10-CM | POA: Diagnosis not present

## 2014-03-02 DIAGNOSIS — W19XXXD Unspecified fall, subsequent encounter: Secondary | ICD-10-CM | POA: Diagnosis not present

## 2014-03-02 DIAGNOSIS — S8255XD Nondisplaced fracture of medial malleolus of left tibia, subsequent encounter for closed fracture with routine healing: Secondary | ICD-10-CM | POA: Diagnosis not present

## 2014-03-02 DIAGNOSIS — S92332D Displaced fracture of third metatarsal bone, left foot, subsequent encounter for fracture with routine healing: Secondary | ICD-10-CM | POA: Diagnosis not present

## 2014-03-02 DIAGNOSIS — S92342D Displaced fracture of fourth metatarsal bone, left foot, subsequent encounter for fracture with routine healing: Secondary | ICD-10-CM | POA: Diagnosis not present

## 2014-03-02 DIAGNOSIS — I951 Orthostatic hypotension: Secondary | ICD-10-CM | POA: Diagnosis not present

## 2014-03-02 NOTE — Assessment & Plan Note (Signed)
On max dose of lipitor Recent lipid profile reviewed

## 2014-03-02 NOTE — Assessment & Plan Note (Signed)
No current issues on statin and ASA Continue to follow with Dr. Allyson SabalBerry

## 2014-03-02 NOTE — Assessment & Plan Note (Signed)
No current issues on prilosec Failed previous wean in the past Colon screening reviewed

## 2014-03-02 NOTE — Assessment & Plan Note (Signed)
Ryhtem controlled on diltiazem Rate controlled on beta blocker ASA only secondary to frequent falls

## 2014-03-02 NOTE — Assessment & Plan Note (Signed)
Denies s/s on current dose of synthroid Recent TSH reviewed

## 2014-03-02 NOTE — Assessment & Plan Note (Signed)
BP well controlled Some history of orthostatic hypotension Only on BB mainly for rate control

## 2014-03-02 NOTE — Assessment & Plan Note (Signed)
On Klonipin TID I would like to try to wean her back off of this a little She is very hesitant at this time Will discuss more at next visit

## 2014-03-02 NOTE — Patient Instructions (Signed)

## 2014-03-02 NOTE — Assessment & Plan Note (Signed)
She is on Seroquel for this I would like to try to wean her off of this especially since she is on remeron and klonipin Will discuss at next visit

## 2014-03-05 DIAGNOSIS — N390 Urinary tract infection, site not specified: Secondary | ICD-10-CM | POA: Diagnosis not present

## 2014-03-05 DIAGNOSIS — S92332D Displaced fracture of third metatarsal bone, left foot, subsequent encounter for fracture with routine healing: Secondary | ICD-10-CM | POA: Diagnosis not present

## 2014-03-05 DIAGNOSIS — S92342D Displaced fracture of fourth metatarsal bone, left foot, subsequent encounter for fracture with routine healing: Secondary | ICD-10-CM | POA: Diagnosis not present

## 2014-03-05 DIAGNOSIS — S8255XD Nondisplaced fracture of medial malleolus of left tibia, subsequent encounter for closed fracture with routine healing: Secondary | ICD-10-CM | POA: Diagnosis not present

## 2014-03-05 DIAGNOSIS — I951 Orthostatic hypotension: Secondary | ICD-10-CM | POA: Diagnosis not present

## 2014-03-05 DIAGNOSIS — W19XXXD Unspecified fall, subsequent encounter: Secondary | ICD-10-CM | POA: Diagnosis not present

## 2014-03-06 DIAGNOSIS — W19XXXD Unspecified fall, subsequent encounter: Secondary | ICD-10-CM | POA: Diagnosis not present

## 2014-03-06 DIAGNOSIS — S92342D Displaced fracture of fourth metatarsal bone, left foot, subsequent encounter for fracture with routine healing: Secondary | ICD-10-CM | POA: Diagnosis not present

## 2014-03-06 DIAGNOSIS — S92332D Displaced fracture of third metatarsal bone, left foot, subsequent encounter for fracture with routine healing: Secondary | ICD-10-CM | POA: Diagnosis not present

## 2014-03-06 DIAGNOSIS — I951 Orthostatic hypotension: Secondary | ICD-10-CM | POA: Diagnosis not present

## 2014-03-06 DIAGNOSIS — S8255XD Nondisplaced fracture of medial malleolus of left tibia, subsequent encounter for closed fracture with routine healing: Secondary | ICD-10-CM | POA: Diagnosis not present

## 2014-03-06 DIAGNOSIS — N390 Urinary tract infection, site not specified: Secondary | ICD-10-CM | POA: Diagnosis not present

## 2014-03-07 DIAGNOSIS — S92335A Nondisplaced fracture of third metatarsal bone, left foot, initial encounter for closed fracture: Secondary | ICD-10-CM | POA: Diagnosis not present

## 2014-03-08 DIAGNOSIS — W19XXXD Unspecified fall, subsequent encounter: Secondary | ICD-10-CM | POA: Diagnosis not present

## 2014-03-08 DIAGNOSIS — N390 Urinary tract infection, site not specified: Secondary | ICD-10-CM | POA: Diagnosis not present

## 2014-03-08 DIAGNOSIS — S8255XD Nondisplaced fracture of medial malleolus of left tibia, subsequent encounter for closed fracture with routine healing: Secondary | ICD-10-CM | POA: Diagnosis not present

## 2014-03-08 DIAGNOSIS — S92342D Displaced fracture of fourth metatarsal bone, left foot, subsequent encounter for fracture with routine healing: Secondary | ICD-10-CM | POA: Diagnosis not present

## 2014-03-08 DIAGNOSIS — S92332D Displaced fracture of third metatarsal bone, left foot, subsequent encounter for fracture with routine healing: Secondary | ICD-10-CM | POA: Diagnosis not present

## 2014-03-08 DIAGNOSIS — I951 Orthostatic hypotension: Secondary | ICD-10-CM | POA: Diagnosis not present

## 2014-03-12 ENCOUNTER — Ambulatory Visit (INDEPENDENT_AMBULATORY_CARE_PROVIDER_SITE_OTHER): Payer: Medicare Other | Admitting: Cardiology

## 2014-03-12 ENCOUNTER — Encounter: Payer: Self-pay | Admitting: Cardiology

## 2014-03-12 VITALS — BP 124/62 | HR 90 | Ht 64.0 in | Wt 116.5 lb

## 2014-03-12 DIAGNOSIS — I10 Essential (primary) hypertension: Secondary | ICD-10-CM

## 2014-03-12 DIAGNOSIS — I951 Orthostatic hypotension: Secondary | ICD-10-CM | POA: Diagnosis not present

## 2014-03-12 DIAGNOSIS — I480 Paroxysmal atrial fibrillation: Secondary | ICD-10-CM | POA: Diagnosis not present

## 2014-03-12 DIAGNOSIS — Z9181 History of falling: Secondary | ICD-10-CM | POA: Diagnosis not present

## 2014-03-12 DIAGNOSIS — I2510 Atherosclerotic heart disease of native coronary artery without angina pectoris: Secondary | ICD-10-CM | POA: Diagnosis not present

## 2014-03-12 NOTE — Assessment & Plan Note (Signed)
controlled 

## 2014-03-12 NOTE — Patient Instructions (Signed)
Your physician recommends that you schedule a follow-up appointment in: 4-5 weeks with Dr. Allyson SabalBerry.

## 2014-03-12 NOTE — Assessment & Plan Note (Signed)
To discuss anticoagulation with Dr. Erlene QuanJ. Berry

## 2014-03-12 NOTE — Assessment & Plan Note (Signed)
-  no chest pain. 

## 2014-03-12 NOTE — Progress Notes (Addendum)
03/12/2014   PCP: Nicki ReaperBAITY, REGINA, NP   Chief Complaint  Patient presents with  . Follow-up    pt denies chest pain and swelling. pt states that she does experience sob at times.    Primary Cardiologist: Dr. Allyson SabalBerry   HPI:  80 y/o female, well known to us, who is accompanied by one of her daughters today. She has a history of CAD and PVOD. She had coronary artery bypass grafting in 1997 as well as carotid endarterectomy at that time. She had a cath December 09, 2003, revealing patent grafts and normal LV function. Her last stress test performed April 03, 2011, was unremarkable. Her other problems include hypertension and hyperlipidemia. She has had PAF in the past but had been holding NSR on last visit in Sept.. She has a history of falling and we stopped her Xarelto a year ago.   Now in a fib after syncope episode.  She was getting out of bed to go to the BR and she passed out.  Her BP was 80 systolic, syncope due to orthostasis.  She is in a fib- rate controlled.  Previously her xarelto was stopped due to falls.  Her falls are better than they were that time, though she still falls.  Her memory is not so good now either.  Her losartan was stopped.    Today, no complaints, no chest pain, + productive cough of clear mucus.  No fevers.  No further syncope. With her syncope she had lt non displaced distal medial malleolus and 3rd and 4th metatarsal neck fracture.   Allergies  Allergen Reactions  . Codeine   . Meperidine Hcl   . Oxycodone-Acetaminophen     Current Outpatient Prescriptions  Medication Sig Dispense Refill  . acetaminophen (TYLENOL) 500 MG tablet Take 500 mg by mouth every 6 (six) hours as needed for mild pain. For pain    . aspirin 81 MG tablet Take 81 mg by mouth daily.      Marland Kitchen. atorvastatin (LIPITOR) 80 MG tablet TAKE 1 TABLET BY MOUTH DAILY (Patient taking differently: Take 80 mg by mouth daily. TAKE 1 TABLET BY MOUTH DAILY) 90 tablet 3  . bisoprolol  (ZEBETA) 5 MG tablet Take 1 tablet (5 mg total) by mouth once daily.    . Calcium Carbonate-Vitamin D (CALCIUM 600-D) 600-400 MG-UNIT per tablet Take 1 tablet by mouth daily.     . clonazePAM (KLONOPIN) 1 MG tablet Take a half tablet when you awaken in the morning take a half tablet at 6:00 PM and take a whole tablet  at bedtime (Patient taking differently: Take 1 mg by mouth every morning. Take a half tablet when you awaken in the morning take a half tablet at 6:00 PM and take a whole tablet  at bedtime) 180 tablet 1  . cyanocobalamin 500 MCG tablet Take 500 mcg by mouth daily.      Marland Kitchen. diltiazem (TIAZAC) 120 MG 24 hr capsule TAKE 1 CAPSULE DAILY 90 capsule 2  . levothyroxine (SYNTHROID, LEVOTHROID) 88 MCG tablet TAKE 1 TABLET DAILY 90 tablet 2  . lubiprostone (AMITIZA) 24 MCG capsule Take 1 capsule (24 mcg total) by mouth 2 (two) times daily with a meal. 180 capsule 3  . mirtazapine (REMERON) 45 MG tablet TAKE 1 TABLET AT BEDTIME (Patient taking differently: Take 45 mg by mouth at bedtime. TAKE 1 TABLET AT BEDTIME) 90 tablet 3  . Multiple Vitamin (MULTIVITAMIN) capsule Take 1 capsule  by mouth daily.      Marland Kitchen. omeprazole (PRILOSEC) 20 MG capsule TAKE 1 CAPSULE DAILY 90 capsule 3  . QUEtiapine (SEROQUEL) 50 MG tablet TAKE 1 TABLET AT BEDTIME 90 tablet 2  . traMADol (ULTRAM) 50 MG tablet Take 1 tablet (50 mg total) by mouth every 8 (eight) hours as needed for pain. 150 tablet 3  . vitamin E 400 UNIT capsule Take 400 Units by mouth daily.       No current facility-administered medications for this visit.    Past Medical History  Diagnosis Date  . Insomnia due to medical condition   . Anxiety   . Hyperlipidemia   . Hypertension   . Hypothyroidism   . Low back pain   . Left wrist fracture 09/2007  . Fracture of right shoulder 02/2008  . GERD (gastroesophageal reflux disease)   . Barrett's esophagus   . Hemorrhoids, external   . CAD (coronary artery disease) of artery bypass graft 1995  . Hiatal  hernia   . History of colon polyps   . Atrial fibrillation with RVR     Past Surgical History  Procedure Laterality Date  . Colonoscopy w/ polypectomy  06/2005  . Carotid endarterectomy    . Cataract extraction    . Carotid doppler Bilateral 05/15/2010    Right Bulb/CEA-demonstrated trace irregular nonhemodynamically significant plaque 0-49%. Left Bulb/Proximal ICA-demonstrated irregular nonhemodynamically signifcant plaque 0-49%.  . Cardiac catheterization  02/25/1996    No intervention-recommend CABG. Left main-80% concentric distal stenosis, LAD-99% ostial stenosis, first diag-80% segmental proximal stenosis, Circumflex-80% ostial stenosis, RCA-90% ostial stenosis.  . Cardiac catheterization  05/21/2003    No intervention-widely patent grafts.  . Coronary artery bypass graft  1997  . Lexiscan myoview  04/03/2011    No scintigraphic evidence of inducible myocardial ischemia. No lexiscan EKG changes. Non-diagnostic for ischemia.   . Transthoracic echocardiogram  01/08/2011    EF 50-55%, LA moderately dilated, moderate tricuspid regurg, mild pulmonary hypertension.    ZOX:WRUEAVW:UJROS:General:no colds or fevers, mild weight changes Skin:no rashes or ulcers HEENT:no blurred vision, no congestion CV:see HPI PUL:see HPI GI:no diarrhea constipation or melena, no indigestion GU:no hematuria, no dysuria MS:no joint pain, no claudication Neuro:no syncope, no lightheadedness Endo:no diabetes, + thyroid disease  Wt Readings from Last 3 Encounters:  03/12/14 116 lb 8 oz (52.844 kg)  03/01/14 118 lb 8 oz (53.751 kg)  02/27/14 118 lb (53.524 kg)    PHYSICAL EXAM BP 124/62 mmHg  Pulse 90  Ht 5\' 4"  (1.626 m)  Wt 116 lb 8 oz (52.844 kg)  BMI 19.99 kg/m2 General:Pleasant affect, NAD Skin:Warm and dry, brisk capillary refill HEENT:normocephalic, sclera clear, mucus membranes moist Neck:supple, no JVD, no bruits  Heart:irreg irreg without murmur, gallup, rub or click Lungs:clear without rales, occ  rhonchi, no wheezes WJX:BJYNAbd:soft, non tender, + BS, do not palpate liver spleen or masses Ext:no lower ext edema, 2+ pedal pulses, 2+ radial pulses Neuro:alert and oriented, MAE, follows commands, + facial symmetry EKG: Atrial fib, rate of 70, no acute changes otherwise.   ASSESSMENT AND PLAN PAF (paroxysmal atrial fibrillation) Has been in atrial fibrillation since syncopal episode 02/25/2014.  Rate is controlled.  CHADs2VASC2 score is 4.  Will discuss risk/benefit ratio to adding back xarelto.  Last EF 60%  Follow up with Dr. Erlene QuanJ. Berry in 4 weeks.  HTN (hypertension) controlled  Orthostatic hypotension BP with recent syncope 80 systolic- ARB stopped.  At risk for falling To discuss anticoagulation with Dr. Erlene QuanJ. Berry  CAD - CABG '97, cath OK 2005, Myoview low risk 12/12 no chest pain.    Discussed anticoagulation with Dr. Allyson Sabal.  He believes the risk too high with falls and recent syncope due to hypotension.  Will keep on ASA.

## 2014-03-12 NOTE — Assessment & Plan Note (Signed)
Has been in atrial fibrillation since syncopal episode 02/25/2014.  Rate is controlled.  CHADs2VASC2 score is 4.  Will discuss risk/benefit ratio to adding back xarelto.  Last EF 60%  Follow up with Dr. Erlene QuanJ. Berry in 4 weeks.

## 2014-03-12 NOTE — Assessment & Plan Note (Signed)
BP with recent syncope 80 systolic- ARB stopped.

## 2014-03-13 ENCOUNTER — Telehealth: Payer: Self-pay | Admitting: *Deleted

## 2014-03-13 ENCOUNTER — Ambulatory Visit: Payer: Medicare Other | Admitting: Physician Assistant

## 2014-03-13 NOTE — Telephone Encounter (Signed)
[name] notified of Dr Hazle CocaBerry's recommendation to stay off of Xarelto. [name] verbalized understanding and was agreeable.

## 2014-03-13 NOTE — Telephone Encounter (Signed)
-----   Message from Leone BrandLaura R Ingold, NP sent at 03/13/2014  9:45 AM EST ----- Samara DeistKathryn, could you please let Ms Jasmine GulaFarlow and her daughter know that Dr. Erlene QuanJ. Berry feels she would be high risk for bleeding with falls and will keep her off the Xarelto.  Thank you.    ----- Message -----    From: Runell GessJonathan J Berry, MD    Sent: 03/13/2014   8:32 AM      To: Leone BrandLaura R Ingold, NP  She sounds like a fall risk. Would keep off of AC.  JJB ----- Message -----    From: Leone BrandLaura R Ingold, NP    Sent: 03/12/2014   4:51 PM      To: Marella BileKathryn W Kanai Berrios, RN, Runell GessJonathan J Berry, MD  Dr. Allyson SabalBerry, please review Ms. Jasmine [name]'s note, she is in a fib. Rate controlled.  Her xarelto had been stopped over a year ago due to falls and she was maintaining SR.  She has less falls now but recent syncope due to hypotension and found to be in atrial fib.  Just wanted you to weigh in for anticoagulation.  Thanks.

## 2014-03-14 DIAGNOSIS — W19XXXD Unspecified fall, subsequent encounter: Secondary | ICD-10-CM | POA: Diagnosis not present

## 2014-03-14 DIAGNOSIS — I951 Orthostatic hypotension: Secondary | ICD-10-CM | POA: Diagnosis not present

## 2014-03-14 DIAGNOSIS — S8255XD Nondisplaced fracture of medial malleolus of left tibia, subsequent encounter for closed fracture with routine healing: Secondary | ICD-10-CM | POA: Diagnosis not present

## 2014-03-14 DIAGNOSIS — S92332D Displaced fracture of third metatarsal bone, left foot, subsequent encounter for fracture with routine healing: Secondary | ICD-10-CM | POA: Diagnosis not present

## 2014-03-14 DIAGNOSIS — N390 Urinary tract infection, site not specified: Secondary | ICD-10-CM | POA: Diagnosis not present

## 2014-03-14 DIAGNOSIS — S92342D Displaced fracture of fourth metatarsal bone, left foot, subsequent encounter for fracture with routine healing: Secondary | ICD-10-CM | POA: Diagnosis not present

## 2014-03-16 DIAGNOSIS — S8255XD Nondisplaced fracture of medial malleolus of left tibia, subsequent encounter for closed fracture with routine healing: Secondary | ICD-10-CM | POA: Diagnosis not present

## 2014-03-16 DIAGNOSIS — S92342D Displaced fracture of fourth metatarsal bone, left foot, subsequent encounter for fracture with routine healing: Secondary | ICD-10-CM | POA: Diagnosis not present

## 2014-03-16 DIAGNOSIS — N390 Urinary tract infection, site not specified: Secondary | ICD-10-CM | POA: Diagnosis not present

## 2014-03-16 DIAGNOSIS — W19XXXD Unspecified fall, subsequent encounter: Secondary | ICD-10-CM | POA: Diagnosis not present

## 2014-03-16 DIAGNOSIS — S92332D Displaced fracture of third metatarsal bone, left foot, subsequent encounter for fracture with routine healing: Secondary | ICD-10-CM | POA: Diagnosis not present

## 2014-03-16 DIAGNOSIS — I951 Orthostatic hypotension: Secondary | ICD-10-CM | POA: Diagnosis not present

## 2014-03-20 DIAGNOSIS — W19XXXD Unspecified fall, subsequent encounter: Secondary | ICD-10-CM | POA: Diagnosis not present

## 2014-03-20 DIAGNOSIS — S92342D Displaced fracture of fourth metatarsal bone, left foot, subsequent encounter for fracture with routine healing: Secondary | ICD-10-CM | POA: Diagnosis not present

## 2014-03-20 DIAGNOSIS — S8255XD Nondisplaced fracture of medial malleolus of left tibia, subsequent encounter for closed fracture with routine healing: Secondary | ICD-10-CM | POA: Diagnosis not present

## 2014-03-20 DIAGNOSIS — S92332D Displaced fracture of third metatarsal bone, left foot, subsequent encounter for fracture with routine healing: Secondary | ICD-10-CM | POA: Diagnosis not present

## 2014-03-20 DIAGNOSIS — I951 Orthostatic hypotension: Secondary | ICD-10-CM | POA: Diagnosis not present

## 2014-03-20 DIAGNOSIS — N390 Urinary tract infection, site not specified: Secondary | ICD-10-CM | POA: Diagnosis not present

## 2014-03-22 ENCOUNTER — Ambulatory Visit (INDEPENDENT_AMBULATORY_CARE_PROVIDER_SITE_OTHER): Payer: Medicare Other | Admitting: Cardiovascular Disease

## 2014-03-22 ENCOUNTER — Encounter: Payer: Self-pay | Admitting: Cardiovascular Disease

## 2014-03-22 VITALS — BP 130/66 | HR 82 | Ht 64.0 in | Wt 114.6 lb

## 2014-03-22 DIAGNOSIS — I480 Paroxysmal atrial fibrillation: Secondary | ICD-10-CM

## 2014-03-22 DIAGNOSIS — E785 Hyperlipidemia, unspecified: Secondary | ICD-10-CM

## 2014-03-22 DIAGNOSIS — I2583 Coronary atherosclerosis due to lipid rich plaque: Secondary | ICD-10-CM

## 2014-03-22 DIAGNOSIS — I951 Orthostatic hypotension: Secondary | ICD-10-CM

## 2014-03-22 DIAGNOSIS — I10 Essential (primary) hypertension: Secondary | ICD-10-CM | POA: Diagnosis not present

## 2014-03-22 DIAGNOSIS — I2510 Atherosclerotic heart disease of native coronary artery without angina pectoris: Secondary | ICD-10-CM | POA: Diagnosis not present

## 2014-03-22 DIAGNOSIS — I739 Peripheral vascular disease, unspecified: Secondary | ICD-10-CM

## 2014-03-22 DIAGNOSIS — I779 Disorder of arteries and arterioles, unspecified: Secondary | ICD-10-CM | POA: Insufficient documentation

## 2014-03-22 NOTE — Assessment & Plan Note (Signed)
History of carotid endarterectomy performed in 1997 at the time of her coronary artery bypass grafting. She recently had carotid Dopplers performed in the setting of syncope which were unremarkable.

## 2014-03-22 NOTE — Assessment & Plan Note (Signed)
History of hyperlipidemia on atorvastatin 80 mg a day. Her most recent lipid profile performed 08/02/13 revealed total cholesterol 137, LDL 72 and HDL of 48. Continue current medications at current dosing

## 2014-03-22 NOTE — Assessment & Plan Note (Signed)
History of paroxysmal atrial fibrillation now chronic. She is rate controlled. She was on Xarelto  which I stopped because of fall risk.

## 2014-03-22 NOTE — Assessment & Plan Note (Signed)
The patient was briefly seen at [facility] and admitted for an episode of syncope thought to be related to orthostasis. Her blood pressure medicines were adjusted.

## 2014-03-22 NOTE — Progress Notes (Signed)
03/22/2014 Griffin Dakinmma A Vasko   1922-07-14  [id number]  Primary Physician Nicki ReaperBAITY, REGINA, NP Primary Cardiologist: Runell GessJonathan J. Lynix Bonine MD Roseanne RenoFACP,FACC,FAHA, FSCAI   HPI:  The patient is an 80 year old, thin and frail-appearing, widowed Caucasian female, mother of 3, grandmother to 7512 grandchildren who is accompanied by one of her daughters today. I saw her 8 months ago. She has a history of CAD and PVOD. She had coronary artery bypass grafting in 1997 as well as carotid endarterectomy at that time. I catheterized her December 09, 2003, revealing patent grafts and normal LV function. Her other problems include hypertension and hyperlipidemia. She developed A-fib with RVR and we have been adjusting her medications. I put her on Xarelto for stroke prophylaxis and she spontaneously converted to sinus rhythm. Her last stress test performed April 03, 2011, was unremarkable. She is otherwise asymptomatic except for dizziness.. The Xarelto was discontinued because of fall risk. She was [facility] because of syncope. Her EKG revealed atrial fibrillation. She was thought to have orthostatic hypotension and her blood pressure medicines were adjusted.   Current Outpatient Prescriptions  Medication Sig Dispense Refill  . acetaminophen (TYLENOL) 500 MG tablet Take 500 mg by mouth every 6 (six) hours as needed for mild pain. For pain    . aspirin 81 MG tablet Take 81 mg by mouth daily.      Marland Kitchen. atorvastatin (LIPITOR) 80 MG tablet TAKE 1 TABLET BY MOUTH DAILY (Patient taking differently: Take 80 mg by mouth daily. TAKE 1 TABLET BY MOUTH DAILY) 90 tablet 3  . bisoprolol (ZEBETA) 5 MG tablet Take 1 tablet (5 mg total) by mouth once daily.    . Calcium Carbonate-Vitamin D (CALCIUM 600-D) 600-400 MG-UNIT per tablet Take 1 tablet by mouth daily.     . clonazePAM (KLONOPIN) 1 MG tablet Take a half tablet when you awaken in the morning take a half tablet at 6:00 PM and take a whole tablet  at  bedtime (Patient taking differently: Take 1 mg by mouth every morning. Take a half tablet when you awaken in the morning take a half tablet at 6:00 PM and take a whole tablet  at bedtime) 180 tablet 1  . cyanocobalamin 500 MCG tablet Take 500 mcg by mouth daily.      Marland Kitchen. diltiazem (TIAZAC) 120 MG 24 hr capsule TAKE 1 CAPSULE DAILY 90 capsule 2  . levothyroxine (SYNTHROID, LEVOTHROID) 88 MCG tablet TAKE 1 TABLET DAILY 90 tablet 2  . lubiprostone (AMITIZA) 24 MCG capsule Take 1 capsule (24 mcg total) by mouth 2 (two) times daily with a meal. 180 capsule 3  . mirtazapine (REMERON) 45 MG tablet TAKE 1 TABLET AT BEDTIME (Patient taking differently: Take 45 mg by mouth at bedtime. TAKE 1 TABLET AT BEDTIME) 90 tablet 3  . Multiple Vitamin (MULTIVITAMIN) capsule Take 1 capsule by mouth daily.      Marland Kitchen. omeprazole (PRILOSEC) 20 MG capsule TAKE 1 CAPSULE DAILY 90 capsule 3  . QUEtiapine (SEROQUEL) 50 MG tablet TAKE 1 TABLET AT BEDTIME 90 tablet 2  . traMADol (ULTRAM) 50 MG tablet Take 1 tablet (50 mg total) by mouth every 8 (eight) hours as needed for pain. 150 tablet 3  . vitamin E 400 UNIT capsule Take 400 Units by mouth daily.       No current facility-administered medications for this visit.    Allergies  Allergen Reactions  . Codeine   . Meperidine Hcl   . Oxycodone-Acetaminophen  History   Social History  . Marital Status: Widowed    Spouse Name: N/A    Number of Children: N/A  . Years of Education: N/A   Occupational History  . Not on file.   Social History Main Topics  . Smoking status: Former Smoker    Quit date: 04/20/1968  . Smokeless tobacco: Never Used     Comment: QUIT IN 1970  . Alcohol Use: No     Comment: STOPPED IN 1970  . Drug Use: No  . Sexual Activity: Not Currently   Other Topics Concern  . Not on file   Social History Narrative     Review of Systems: General: negative for chills, fever, night sweats or weight changes.  Cardiovascular: negative for chest  pain, dyspnea on exertion, edema, orthopnea, palpitations, paroxysmal nocturnal dyspnea or shortness of breath Dermatological: negative for rash Respiratory: negative for cough or wheezing Urologic: negative for hematuria Abdominal: negative for nausea, vomiting, diarrhea, bright red blood per rectum, melena, or hematemesis Neurologic: negative for visual changes, syncope, or dizziness All other systems reviewed and are otherwise negative except as noted above.    Blood pressure 130/66, pulse 82, height 5\' 4"  (1.626 m), weight 114 lb 9.6 oz (51.982 kg).  General appearance: alert and no distress Neck: no adenopathy, no carotid bruit, no JVD, supple, symmetrical, trachea midline and thyroid not enlarged, symmetric, no tenderness/mass/nodules Lungs: clear to auscultation bilaterally Heart: irregularly irregular rhythm Extremities: extremities normal, atraumatic, no cyanosis or edema  EKG not performed today  ASSESSMENT AND PLAN:   Orthostatic hypotension The patient was briefly [facility] and admitted for an episode of syncope thought to be related to orthostasis. Her blood pressure medicines were adjusted.  HLD (hyperlipidemia) History of hyperlipidemia on atorvastatin 80 mg a day. Her most recent lipid profile performed 08/02/13 revealed total cholesterol 137, LDL 72 and HDL of 48. Continue current medications at current dosing  HTN (hypertension) History of hypertension with recent blood pressure today measured at 130/66. [facility]  for syncope related to orthostasis. Her losartan was discontinued.  PAF (paroxysmal atrial fibrillation) History of paroxysmal atrial fibrillation now chronic. She is rate controlled. She was on Xarelto  which I stopped because of fall risk.  CAD - CABG '97, cath OK 2005, Myoview low risk 12/12 History of coronary artery disease status post coronary artery bypass grafting in 1997. Her most recent  Myoview stress test performed 04/03/11 was unremarkable. She denies chest pain or shortness of breath  Carotid artery disease History of carotid endarterectomy performed in 1997 at the time of her coronary artery bypass grafting. She recently had carotid Dopplers performed in the setting of syncope which were unremarkable.      Runell GessJonathan J. Algenis Ballin MD FACP,FACC,FAHA, [facility] 03/22/2014 3:26 PM

## 2014-03-22 NOTE — Assessment & Plan Note (Signed)
History of coronary artery disease status post coronary artery bypass grafting in 1997. Her most recent Myoview stress test performed 04/03/11 was unremarkable. She denies chest pain or shortness of breath

## 2014-03-22 NOTE — Assessment & Plan Note (Signed)
History of hypertension with recent blood pressure today measured at 130/66. She was recently admitted to [facility] for syncope related to orthostasis. Her losartan was discontinued.

## 2014-03-22 NOTE — Patient Instructions (Signed)
Your physician wants you to follow-up in: 6 months with Vernona RiegerLaura or [place]. You will receive a reminder letter in the mail two months in advance. If you don't receive a letter, please call our office to schedule the follow-up appointment.   Dr.Berry wants you to follow-up in: 1 Year with him.  You will receive a reminder letter in the mail two months in advance. If you don't receive a letter, please call our office to schedule the follow-up appointment.

## 2014-03-23 DIAGNOSIS — S92332D Displaced fracture of third metatarsal bone, left foot, subsequent encounter for fracture with routine healing: Secondary | ICD-10-CM | POA: Diagnosis not present

## 2014-03-23 DIAGNOSIS — W19XXXD Unspecified fall, subsequent encounter: Secondary | ICD-10-CM | POA: Diagnosis not present

## 2014-03-23 DIAGNOSIS — I951 Orthostatic hypotension: Secondary | ICD-10-CM | POA: Diagnosis not present

## 2014-03-23 DIAGNOSIS — N390 Urinary tract infection, site not specified: Secondary | ICD-10-CM | POA: Diagnosis not present

## 2014-03-23 DIAGNOSIS — S8255XD Nondisplaced fracture of medial malleolus of left tibia, subsequent encounter for closed fracture with routine healing: Secondary | ICD-10-CM | POA: Diagnosis not present

## 2014-03-23 DIAGNOSIS — S92342D Displaced fracture of fourth metatarsal bone, left foot, subsequent encounter for fracture with routine healing: Secondary | ICD-10-CM | POA: Diagnosis not present

## 2014-03-26 DIAGNOSIS — S92342D Displaced fracture of fourth metatarsal bone, left foot, subsequent encounter for fracture with routine healing: Secondary | ICD-10-CM | POA: Diagnosis not present

## 2014-03-26 DIAGNOSIS — I951 Orthostatic hypotension: Secondary | ICD-10-CM | POA: Diagnosis not present

## 2014-03-26 DIAGNOSIS — S92332D Displaced fracture of third metatarsal bone, left foot, subsequent encounter for fracture with routine healing: Secondary | ICD-10-CM | POA: Diagnosis not present

## 2014-03-26 DIAGNOSIS — W19XXXD Unspecified fall, subsequent encounter: Secondary | ICD-10-CM | POA: Diagnosis not present

## 2014-03-26 DIAGNOSIS — S8255XD Nondisplaced fracture of medial malleolus of left tibia, subsequent encounter for closed fracture with routine healing: Secondary | ICD-10-CM | POA: Diagnosis not present

## 2014-03-26 DIAGNOSIS — N390 Urinary tract infection, site not specified: Secondary | ICD-10-CM | POA: Diagnosis not present

## 2014-03-27 NOTE — Addendum Note (Signed)
Addended byMarella Bile: Lilie Vezina W. on: 03/27/2014 02:56 PM   Modules accepted: Orders

## 2014-03-28 DIAGNOSIS — S92335D Nondisplaced fracture of third metatarsal bone, left foot, subsequent encounter for fracture with routine healing: Secondary | ICD-10-CM | POA: Diagnosis not present

## 2014-03-30 DIAGNOSIS — S92342D Displaced fracture of fourth metatarsal bone, left foot, subsequent encounter for fracture with routine healing: Secondary | ICD-10-CM | POA: Diagnosis not present

## 2014-03-30 DIAGNOSIS — S8255XD Nondisplaced fracture of medial malleolus of left tibia, subsequent encounter for closed fracture with routine healing: Secondary | ICD-10-CM | POA: Diagnosis not present

## 2014-03-30 DIAGNOSIS — N390 Urinary tract infection, site not specified: Secondary | ICD-10-CM | POA: Diagnosis not present

## 2014-03-30 DIAGNOSIS — I951 Orthostatic hypotension: Secondary | ICD-10-CM | POA: Diagnosis not present

## 2014-03-30 DIAGNOSIS — S92332D Displaced fracture of third metatarsal bone, left foot, subsequent encounter for fracture with routine healing: Secondary | ICD-10-CM | POA: Diagnosis not present

## 2014-03-30 DIAGNOSIS — W19XXXD Unspecified fall, subsequent encounter: Secondary | ICD-10-CM | POA: Diagnosis not present

## 2014-04-03 ENCOUNTER — Ambulatory Visit: Payer: Medicare Other | Admitting: Cardiovascular Disease

## 2014-04-17 ENCOUNTER — Other Ambulatory Visit: Payer: Self-pay

## 2014-04-17 MED ORDER — LEVOTHYROXINE SODIUM 88 MCG PO TABS: 88.0000 ug | ORAL_TABLET | Freq: Every day | ORAL | Status: DC

## 2014-04-17 NOTE — Telephone Encounter (Signed)
V/M left requesting refill [facility]othyroxine [facility] m[facility] and request cb when refilled. [name] NP has not filled before; pt was seen on 03/01/14 and has f/u appt on 06/01/14.Please advise.

## 2014-05-04 ENCOUNTER — Other Ambulatory Visit: Payer: Self-pay | Admitting: *Deleted

## 2014-05-04 MED ORDER — OMEPRAZOLE 20 MG PO CPDR: 20.0000 mg | DELAYED_RELEASE_CAPSULE | Freq: Every day | ORAL | Status: DC

## 2014-05-31 ENCOUNTER — Ambulatory Visit: Payer: Medicare Other | Admitting: Internal Medicine

## 2014-06-01 ENCOUNTER — Ambulatory Visit (INDEPENDENT_AMBULATORY_CARE_PROVIDER_SITE_OTHER): Payer: Medicare Other | Admitting: Internal Medicine

## 2014-06-01 ENCOUNTER — Encounter: Payer: Self-pay | Admitting: Internal Medicine

## 2014-06-01 VITALS — BP 94/58 | HR 86 | Temp 96.8°F | Wt 114.0 lb

## 2014-06-01 DIAGNOSIS — F411 Generalized anxiety disorder: Secondary | ICD-10-CM

## 2014-06-01 DIAGNOSIS — G4700 Insomnia, unspecified: Secondary | ICD-10-CM

## 2014-06-01 DIAGNOSIS — I952 Hypotension due to drugs: Secondary | ICD-10-CM

## 2014-06-01 MED ORDER — CLONAZEPAM 1 MG PO TABS: ORAL_TABLET | ORAL | Status: DC

## 2014-06-01 MED ORDER — QUETIAPINE FUMARATE 25 MG PO TABS: 25.0000 mg | ORAL_TABLET | Freq: Every day | ORAL | Status: DC

## 2014-06-01 MED ORDER — BISOPROLOL FUMARATE 5 MG PO TABS: 5.0000 mg | ORAL_TABLET | Freq: Every day | ORAL | Status: DC

## 2014-06-01 NOTE — Assessment & Plan Note (Signed)
Continue Remeron at current dose Cut Seroquel in half d/t borderline hypotension

## 2014-06-01 NOTE — Patient Instructions (Signed)
Generalized Anxiety Disorder Generalized anxiety disorder (GAD) is a mental disorder. It interferes with life functions, including relationships, work, and school. GAD is different from normal anxiety, which everyone experiences at some point in their lives in response to specific life events and activities. Normal anxiety actually helps us prepare for and get through these life events and activities. Normal anxiety goes away after the event or activity is over.  GAD causes anxiety that is not necessarily related to specific events or activities. It also causes excess anxiety in proportion to specific events or activities. The anxiety associated with GAD is also difficult to control. GAD can vary from mild to severe. People with severe GAD can have intense waves of anxiety with physical symptoms (panic attacks).  SYMPTOMS The anxiety and worry associated with GAD are difficult to control. This anxiety and worry are related to many life events and activities and also occur more days than not for 6 months or longer. People with GAD also have three or more of the following symptoms (one or more in children):  Restlessness.   Fatigue.  Difficulty concentrating.   Irritability.  Muscle tension.  Difficulty sleeping or unsatisfying sleep. DIAGNOSIS GAD is diagnosed through an assessment by your health care provider. Your health care provider will ask you questions aboutyour mood,physical symptoms, and events in your life. Your health care provider may ask you about your medical history and use of alcohol or drugs, including prescription medicines. Your health care provider may also do a physical exam and blood tests. Certain medical conditions and the use of certain substances can cause symptoms similar to those associated with GAD. Your health care provider may refer you to a mental health specialist for further evaluation. TREATMENT The following therapies are usually used to treat GAD:    Medication. Antidepressant medication usually is prescribed for long-term daily control. Antianxiety medicines may be added in severe cases, especially when panic attacks occur.   Talk therapy (psychotherapy). Certain types of talk therapy can be helpful in treating GAD by providing support, education, and guidance. A form of talk therapy called cognitive behavioral therapy can teach you healthy ways to think about and react to daily life events and activities.  Stress managementtechniques. These include yoga, meditation, and exercise and can be very helpful when they are practiced regularly. A mental health specialist can help determine which treatment is best for you. Some people see improvement with one therapy. However, other people require a combination of therapies. Document Released: 08/01/2012 Document Revised: 08/21/2013 Document Reviewed: 08/01/2012 Exit[employer] Patient Information 2015 [employer]. This information is not intended to replace advice given to you by your health care provider. Make sure you discuss any questions you have with your health care provider.  

## 2014-06-01 NOTE — Progress Notes (Signed)
Pre visit review using our clinic review tool, if applicable. No additional management support is needed unless otherwise documented below in the visit note. 

## 2014-06-01 NOTE — Assessment & Plan Note (Signed)
Chronic, moderate She has not been able to identify a trigger Continue Clonazepam RX refilled today I think she would benefit from seeing geriatric psychiatrist Tried to obtain UDS but pt was unable to urinate

## 2014-06-01 NOTE — Progress Notes (Signed)
Subjective:    Patient ID: Jasmine [name], female    DOB: 1923/02/25, 81 y.o.   MRN: [id number]  HPI  Pt presents to the clinic today to follow up anxiety and insomnia. She reports that her anxiety and insomnia seem worse. She is having trouble sleeping because she is so anxious. She does not know what is causing her anxiety but reports she has always been an anxious person. She is taking Remeron, Klonopin and Seroquel. Her daughter gives her her medications. She has not seen a therapist. She denies depression. Of note, her blood pressure is on the low side. She has had issues with orthostatic hypotension in the past. Currently only on Bisoprolol 5 mg daily.  Review of Systems      Past Medical History  Diagnosis Date  . Insomnia due to medical condition   . Anxiety   . Hyperlipidemia   . Hypertension   . Hypothyroidism   . Low back pain   . Left wrist fracture 09/2007  . Fracture of right shoulder 02/2008  . GERD (gastroesophageal reflux disease)   . Barrett's esophagus   . Hemorrhoids, external   . CAD (coronary artery disease) of artery bypass graft 1995  . Hiatal hernia   . History of colon polyps   . Atrial fibrillation with RVR   . Syncope     Current Outpatient Prescriptions  Medication Sig Dispense Refill  . acetaminophen (TYLENOL) 500 MG tablet Take 500 mg by mouth every 6 (six) hours as needed for mild pain. For pain    . aspirin 81 MG tablet Take 81 mg by mouth daily.      Marland Kitchen atorvastatin (LIPITOR) 80 MG tablet TAKE 1 TABLET BY MOUTH DAILY (Patient taking differently: Take 80 mg by mouth daily. TAKE 1 TABLET BY MOUTH DAILY) 90 tablet 3  . bisoprolol (ZEBETA) 5 MG tablet Take 1 tablet (5 mg total) by mouth daily. 90 tablet 1  . Calcium Carbonate-Vitamin D (CALCIUM 600-D) 600-400 MG-UNIT per tablet Take 1 tablet by mouth daily.     . clonazePAM (KLONOPIN) 1 MG tablet Take a half tablet when you awaken in the morning take a half tablet at 6:00 PM and take a whole tablet   at bedtime 60 tablet 0  . cyanocobalamin 500 MCG tablet Take 500 mcg by mouth daily.      Marland Kitchen diltiazem (TIAZAC) 120 MG 24 hr capsule TAKE 1 CAPSULE DAILY 90 capsule 2  . levothyroxine (SYNTHROID, LEVOTHROID) 88 MCG tablet Take 1 tablet (88 mcg total) by mouth daily. 90 tablet 0  . lubiprostone (AMITIZA) 24 MCG capsule Take 1 capsule (24 mcg total) by mouth 2 (two) times daily with a meal. 180 capsule 3  . mirtazapine (REMERON) 45 MG tablet TAKE 1 TABLET AT BEDTIME (Patient taking differently: Take 45 mg by mouth at bedtime. TAKE 1 TABLET AT BEDTIME) 90 tablet 3  . Multiple Vitamin (MULTIVITAMIN) capsule Take 1 capsule by mouth daily.      Marland Kitchen omeprazole (PRILOSEC) 20 MG capsule Take 1 capsule (20 mg total) by mouth daily. 90 capsule 0  . traMADol (ULTRAM) 50 MG tablet Take 1 tablet (50 mg total) by mouth every 8 (eight) hours as needed for pain. 150 tablet 3  . vitamin E 400 UNIT capsule Take 400 Units by mouth daily.      . QUEtiapine (SEROQUEL) 25 MG tablet Take 1 tablet (25 mg total) by mouth at bedtime. 90 tablet 0   No current facility-administered  medications for this visit.    Allergies  Allergen Reactions  . Codeine   . Meperidine Hcl   . Oxycodone-Acetaminophen     Family History  Problem Relation Age of Onset  . Diabetes Son   . Heart disease Mother   . Heart disease Father     History   Social History  . Marital Status: Widowed    Spouse Name: N/A  . Number of Children: N/A  . Years of Education: N/A   Occupational History  . Not on file.   Social History Main Topics  . Smoking status: Former Smoker    Quit date: 04/20/1968  . Smokeless tobacco: Never Used     Comment: QUIT IN 1970  . Alcohol Use: No     Comment: STOPPED IN 1970  . Drug Use: No  . Sexual Activity: Not Currently   Other Topics Concern  . Not on file   Social History Narrative     Constitutional: Denies fever, malaise, fatigue, headache or abrupt weight changes.  Respiratory: Denies  difficulty breathing, shortness of breath, cough or sputum production.   Cardiovascular: Denies chest pain, chest tightness, palpitations or swelling in the hands or feet.  Neurological: Pt reports insomnia. Denies dizziness, difficulty with memory, difficulty with speech or problems with balance and coordination.  Psych: Pt reports anxiety. Denies depression, SI/HI.  No other specific complaints in a complete review of systems (except as listed in HPI above).  Objective:   Physical Exam   BP 94/58 mmHg  Pulse 86  Temp(Src) 96.8 F (36 C) (Tympanic)  Wt 114 lb (51.71 kg)  SpO2 99% Wt Readings from Last 3 Encounters:  06/01/14 114 lb (51.71 kg)  03/22/14 114 lb 9.6 oz (51.982 kg)  03/12/14 116 lb 8 oz (52.844 kg)    General: Appears her stated age, chronically ill appearing in NAD. Skin: Warm, dry and intact. No rashes, lesions or ulcerations noted. Cardiovascular: Normal rate and rhythm. S1,S2 noted.  No murmur, rubs or gallops noted.  Pulmonary/Chest: Normal effort and positive vesicular breath sounds. No respiratory distress. No wheezes, rales or ronchi noted.  Neurological: Alert and oriented. Coordination normal. Psychiatric: Mood anxious appearing but affect normal. Behavior is normal. Judgment and thought content normal.     BMET    Component Value Date/Time   NA 136* 02/26/2014 0400   K 4.3 02/26/2014 0400   CL 99 02/26/2014 0400   CO2 26 02/26/2014 0400   GLUCOSE 113* 02/26/2014 0400   BUN 12 02/26/2014 0400   CREATININE 1.03 02/26/2014 0400   CREATININE 1.00 01/15/2014 1528   CALCIUM 8.5 02/26/2014 0400   GFRNONAA 47* 02/26/2014 0400   GFRAA 54* 02/26/2014 0400    Lipid Panel     Component Value Date/Time   CHOL 137 08/02/2013 1532   TRIG 84 08/02/2013 1532   HDL 48 08/02/2013 1532   CHOLHDL 2.9 08/02/2013 1532   VLDL 17 08/02/2013 1532   LDLCALC 72 08/02/2013 1532    CBC    Component Value Date/Time   WBC 4.5 02/26/2014 0400   RBC 4.23  02/26/2014 0400   HGB 12.6 02/26/2014 0400   HCT 39.1 02/26/2014 0400   PLT 109* 02/26/2014 0400   MCV 92.4 02/26/2014 0400   MCH 29.8 02/26/2014 0400   MCHC 32.2 02/26/2014 0400   RDW 13.7 02/26/2014 0400   LYMPHSABS 0.7 02/25/2014 1918   MONOABS 0.4 02/25/2014 1918   EOSABS 0.0 02/25/2014 1918   BASOSABS 0.0 02/25/2014 1918  Hgb A1C No results found for: HGBA1C      Assessment & Plan:   Borderline Hypotension:  Will decrease Seroquel to 25 mg QHS (I eventually want to stop this completely but daughter not will to stop at this time) Need to continue Bisprolol 5 mg/ Diltiazem daily d/t paroxysmal afib Advised her to make position changes slowly  RTC in 6 months or sooner if needed

## 2014-06-27 ENCOUNTER — Other Ambulatory Visit: Payer: Self-pay | Admitting: Internal Medicine

## 2014-07-04 ENCOUNTER — Other Ambulatory Visit: Payer: Self-pay | Admitting: Cardiovascular Disease

## 2014-07-04 NOTE — Telephone Encounter (Signed)
Rx(s) sent to pharmacy electronically.  

## 2014-07-06 ENCOUNTER — Other Ambulatory Visit: Payer: Self-pay

## 2014-07-06 MED ORDER — OMEPRAZOLE 20 MG PO CPDR: 20.0000 mg | DELAYED_RELEASE_CAPSULE | Freq: Every day | ORAL | Status: DC

## 2014-07-11 ENCOUNTER — Other Ambulatory Visit: Payer: Self-pay | Admitting: Internal Medicine

## 2014-07-11 NOTE — Telephone Encounter (Signed)
Last filled 06/01/2014--please advise 

## 2014-07-11 NOTE — Telephone Encounter (Signed)
Rx called in to pharmacy. 

## 2014-07-11 NOTE — Telephone Encounter (Signed)
Ok to phone in clonazepam 

## 2014-08-10 ENCOUNTER — Other Ambulatory Visit: Payer: Self-pay | Admitting: Internal Medicine

## 2014-08-10 NOTE — Telephone Encounter (Signed)
Rx called in to pharmacy. 

## 2014-08-10 NOTE — Telephone Encounter (Signed)
Ok to phone in Clonazepam 

## 2014-08-10 NOTE — Telephone Encounter (Signed)
Last filled 07/11/2014--please advise

## 2014-08-14 ENCOUNTER — Other Ambulatory Visit: Payer: Self-pay | Admitting: Internal Medicine

## 2014-08-14 MED ORDER — QUETIAPINE FUMARATE 25 MG PO TABS: 25.0000 mg | ORAL_TABLET | Freq: Every day | ORAL | Status: DC

## 2014-08-14 NOTE — Addendum Note (Signed)
Addended by: Roena MaladyEVONTENNO, Shara Hartis Y on: 08/14/2014 11:09 AM   Modules accepted: Orders

## 2014-08-14 NOTE — Addendum Note (Signed)
Addended by: Roena MaladyEVONTENNO, Jeena Arnett Y on: 08/14/2014 01:37 PM   Modules accepted: Orders

## 2014-08-17 ENCOUNTER — Other Ambulatory Visit: Payer: Self-pay | Admitting: *Deleted

## 2014-08-17 MED ORDER — QUETIAPINE FUMARATE 25 MG PO TABS: 25.0000 mg | ORAL_TABLET | Freq: Every day | ORAL | Status: DC

## 2014-09-10 ENCOUNTER — Other Ambulatory Visit: Payer: Self-pay | Admitting: Internal Medicine

## 2014-09-11 NOTE — Telephone Encounter (Signed)
Please phone in klonipin 

## 2014-09-11 NOTE — Telephone Encounter (Signed)
Rx called in to pharmacy. 

## 2014-09-11 NOTE — Telephone Encounter (Signed)
Last filled 08/10/14--please advise

## 2014-10-10 ENCOUNTER — Other Ambulatory Visit: Payer: Self-pay | Admitting: Internal Medicine

## 2014-10-10 NOTE — Telephone Encounter (Signed)
Last filled 09/11/2014--please advise 

## 2014-10-10 NOTE — Telephone Encounter (Signed)
Please phone in clonazepam

## 2014-10-11 NOTE — Telephone Encounter (Signed)
Rx called in to pharmacy. 

## 2014-10-23 ENCOUNTER — Encounter: Payer: Self-pay | Admitting: Gastroenterology

## 2014-11-08 ENCOUNTER — Other Ambulatory Visit: Payer: Self-pay | Admitting: Internal Medicine

## 2014-11-08 NOTE — Telephone Encounter (Signed)
Ok to phone in Clonazepam. Can we have her come in a do a UDS at some point?

## 2014-11-08 NOTE — Telephone Encounter (Signed)
Last filled 10/10/2014--please advise

## 2014-11-09 NOTE — Telephone Encounter (Signed)
Rx called in to pharmacy. 

## 2014-11-16 ENCOUNTER — Other Ambulatory Visit: Payer: Self-pay

## 2014-11-16 ENCOUNTER — Other Ambulatory Visit: Payer: Self-pay | Admitting: Family Medicine

## 2014-11-16 NOTE — Telephone Encounter (Signed)
Dr Allyson Sabal is filling this for her

## 2014-11-16 NOTE — Telephone Encounter (Signed)
Pt has an upcoming 6 mth f/u appt with you 8/16--this medication has not been filled by you--please advise if okay to refill

## 2014-11-20 ENCOUNTER — Other Ambulatory Visit: Payer: Self-pay | Admitting: Internal Medicine

## 2014-11-21 ENCOUNTER — Other Ambulatory Visit: Payer: Self-pay

## 2014-11-21 MED ORDER — ATORVASTATIN CALCIUM 80 MG PO TABS: ORAL_TABLET | ORAL | Status: DC

## 2014-11-21 MED ORDER — MIRTAZAPINE 45 MG PO TABS: ORAL_TABLET | ORAL | Status: DC

## 2014-11-21 NOTE — Telephone Encounter (Signed)
Mirtazapine has not been filled by you--please advise if okay [facility]

## 2014-11-28 ENCOUNTER — Other Ambulatory Visit: Payer: Self-pay

## 2014-11-28 MED ORDER — MIRTAZAPINE 45 MG PO TABS: ORAL_TABLET | ORAL | Status: DC

## 2014-11-30 ENCOUNTER — Other Ambulatory Visit: Payer: Self-pay | Admitting: Internal Medicine

## 2014-12-03 ENCOUNTER — Ambulatory Visit (INDEPENDENT_AMBULATORY_CARE_PROVIDER_SITE_OTHER): Payer: Medicare Other | Admitting: Internal Medicine

## 2014-12-03 ENCOUNTER — Encounter: Payer: Self-pay | Admitting: Internal Medicine

## 2014-12-03 VITALS — BP 128/86 | HR 87 | Temp 97.8°F | Wt 106.0 lb

## 2014-12-03 DIAGNOSIS — I480 Paroxysmal atrial fibrillation: Secondary | ICD-10-CM | POA: Diagnosis not present

## 2014-12-03 DIAGNOSIS — E785 Hyperlipidemia, unspecified: Secondary | ICD-10-CM | POA: Diagnosis not present

## 2014-12-03 DIAGNOSIS — F411 Generalized anxiety disorder: Secondary | ICD-10-CM

## 2014-12-03 DIAGNOSIS — E039 Hypothyroidism, unspecified: Secondary | ICD-10-CM | POA: Diagnosis not present

## 2014-12-03 DIAGNOSIS — K2270 Barrett's esophagus without dysplasia: Secondary | ICD-10-CM

## 2014-12-03 DIAGNOSIS — I10 Essential (primary) hypertension: Secondary | ICD-10-CM

## 2014-12-03 DIAGNOSIS — I951 Orthostatic hypotension: Secondary | ICD-10-CM

## 2014-12-03 DIAGNOSIS — G4700 Insomnia, unspecified: Secondary | ICD-10-CM

## 2014-12-03 DIAGNOSIS — I739 Peripheral vascular disease, unspecified: Secondary | ICD-10-CM

## 2014-12-03 DIAGNOSIS — I779 Disorder of arteries and arterioles, unspecified: Secondary | ICD-10-CM

## 2014-12-03 MED ORDER — ATORVASTATIN CALCIUM 80 MG PO TABS: ORAL_TABLET | ORAL | Status: DC

## 2014-12-03 MED ORDER — QUETIAPINE FUMARATE 25 MG PO TABS: 25.0000 mg | ORAL_TABLET | Freq: Every day | ORAL | Status: DC

## 2014-12-03 NOTE — Assessment & Plan Note (Signed)
Continue Prilosec at this time Will check CMET today

## 2014-12-03 NOTE — Progress Notes (Signed)
Subjective:    Patient ID: Jasmine [name], female    DOB: [birth year], 81 y.o.   MRN: [id number]  HPI  Pt presents to the clinic today for 6 month follow up of chronic conditions.  Insomnia: She reports she sleeps okay. Her daughter reports she does not sleep well. She can not tell me how many hours she gets per night. She takes Remeron, Klonipin and Seroquel nightly. She does not feel sleepy during the day but her daughter reports that she often naps in her chair.  Anxiety: She takes Klonopin three times per day. It seems to control her anxiety very well.  Barretts Esophagus: She denies s/s of reflux on Prilosec.  CAD/HLD: Her last LDL was 72. She denies myalgias on Lipitor. She denies chest pain. She has not seen cardiology since 03/2014.  HTN: BP well controlled off meds. She does have a history of orthostatic hypotension but has not had an issue with this recently.  Hypothyroidism: She denies any current issues on Synthroid. She is due to have her labs checked today.  PAF: She takes Bisoprolol and Diltiazem as prescribed. Her Xarelto was stopped secondary to fall risk but Dr. Allyson Sabal.  Constipation: She is moving her bowels regularly on Amitiza.  Review of Systems      Past Medical History  Diagnosis Date  . Insomnia due to medical condition   . Anxiety   . Hyperlipidemia   . Hypertension   . Hypothyroidism   . Low back pain   . Left wrist fracture 09/2007  . Fracture of right shoulder 02/2008  . GERD (gastroesophageal reflux disease)   . Barrett's esophagus   . Hemorrhoids, external   . CAD (coronary artery disease) of artery bypass graft 1995  . Hiatal hernia   . History of colon polyps   . Atrial fibrillation with RVR   . Syncope     Current Outpatient Prescriptions  Medication Sig Dispense Refill  . acetaminophen (TYLENOL) 500 MG tablet Take 500 mg by mouth every 6 (six) hours as needed for mild pain. For pain    . aspirin 81 MG tablet Take 81 mg by mouth  daily.      Marland Kitchen atorvastatin (LIPITOR) 80 MG tablet TAKE 1 TABLET BY MOUTH DAILY 90 tablet 3  . bisoprolol (ZEBETA) 5 MG tablet TAKE 1 TABLET DAILY 90 tablet 0  . Calcium Carbonate-Vitamin D (CALCIUM 600-D) 600-400 MG-UNIT per tablet Take 1 tablet by mouth daily.     . clonazePAM (KLONOPIN) 1 MG tablet TAKE 1/2 TABLET BY MOUTH IN THE MORNING, 1/2 TABLET AT 6PM, AND 1 TABLET AT BEDTIME. 60 tablet 0  . cyanocobalamin 500 MCG tablet Take 500 mcg by mouth daily.      Marland Kitchen diltiazem (TIAZAC) 120 MG 24 hr capsule Take 1 capsule (120 mg total) by mouth daily. 90 capsule 2  . levothyroxine (SYNTHROID, LEVOTHROID) 88 MCG tablet TAKE 1 TABLET DAILY 90 tablet 1  . lubiprostone (AMITIZA) 24 MCG capsule Take 1 capsule (24 mcg total) by mouth 2 (two) times daily with a meal. 180 capsule 3  . mirtazapine (REMERON) 45 MG tablet TAKE 1 TABLET AT BEDTIME 90 tablet 0  . Multiple Vitamin (MULTIVITAMIN) capsule Take 1 capsule by mouth daily.      Marland Kitchen omeprazole (PRILOSEC) 20 MG capsule Take 1 capsule (20 mg total) by mouth daily. 90 capsule 2  . QUEtiapine (SEROQUEL) 25 MG tablet Take 1 tablet (25 mg total) by mouth at bedtime. 90 tablet 1  .  traMADol (ULTRAM) 50 MG tablet Take 1 tablet (50 mg total) by mouth every 8 (eight) hours as needed for pain. 150 tablet 3  . vitamin E 400 UNIT capsule Take 400 Units by mouth daily.       No current facility-administered medications for this visit.    Allergies  Allergen Reactions  . Codeine   . Meperidine Hcl   . Oxycodone-Acetaminophen     Family History  Problem Relation Age of Onset  . Diabetes Son   . Heart disease Mother   . Heart disease Father     Social History   Social History  . Marital Status: Widowed    Spouse Name: N/A  . Number of Children: N/A  . Years of Education: N/A   Occupational History  . Not on file.   Social History Main Topics  . Smoking status: Former Smoker    Quit date: 04/20/1968  . Smokeless tobacco: Never Used     Comment:  QUIT IN 1970  . Alcohol Use: No     Comment: STOPPED IN 1970  . Drug Use: No  . Sexual Activity: Not Currently   Other Topics Concern  . Not on file   Social History Narrative     Constitutional: Denies fever, malaise, fatigue, headache or abrupt weight changes.  HEENT: Denies eye pain, eye redness, ear pain, ringing in the ears, wax buildup, runny nose, nasal congestion, bloody nose, or sore throat. Respiratory: Denies difficulty breathing, shortness of breath, cough or sputum production.   Cardiovascular: Denies chest pain, chest tightness, palpitations or swelling in the hands or feet.  Gastrointestinal: Pt reports poor appetite. Denies abdominal pain, bloating, constipation, diarrhea or blood in the stool.  GU: Denies urgency, frequency, pain with urination, burning sensation, blood in urine, odor or discharge. Musculoskeletal: Denies decrease in range of motion, difficulty with gait, muscle pain or joint pain and swelling.  Skin: Denies redness, rashes, lesions or ulcercations.  Neurological: Pt reports difficulty with memory. Denies dizziness, difficulty with speech.  Psych: Pt reports anxiety, depression, SI/HI.  No other specific complaints in a complete review of systems (except as listed in HPI above).  Objective:   Physical Exam  BP 128/86 mmHg  Pulse 87  Temp(Src) 97.8 F (36.6 C) (Oral)  Wt 106 lb (48.081 kg)  SpO2 98% Wt Readings from Last 3 Encounters:  12/03/14 106 lb (48.081 kg)  06/01/14 114 lb (51.71 kg)  03/22/14 114 lb 9.6 oz (51.982 kg)    General: Appears her stated age, in NAD. Skin: Warm, dry and intact. Seborrheic keratosis noted on left cheek. Neck: Neck supple, trachea midline. No masses, lumps or thyromegaly present.  Cardiovascular: Normal rate with irregular rhythm. S1,S2 noted.  No murmur, rubs or gallops noted. No JVD or BLE edema. No carotid bruits noted. Pulmonary/Chest: Normal effort and positive vesicular breath sounds. No respiratory  distress. No wheezes, rales or ronchi noted.  Abdomen: Soft and nontender. Normal bowel sounds. Neurological: Alert and oriented.  Psychiatric: Mood and affect normal.   BMET    Component Value Date/Time   NA 136* 02/26/2014 0400   K 4.3 02/26/2014 0400   CL 99 02/26/2014 0400   CO2 26 02/26/2014 0400   GLUCOSE 113* 02/26/2014 0400   BUN 12 02/26/2014 0400   CREATININE 1.03 02/26/2014 0400   CREATININE 1.00 01/15/2014 1528   CALCIUM 8.5 02/26/2014 0400   GFRNONAA 47* 02/26/2014 0400   GFRAA 54* 02/26/2014 0400    Lipid Panel  Component Value Date/Time   CHOL 137 08/02/2013 1532   TRIG 84 08/02/2013 1532   HDL 48 08/02/2013 1532   CHOLHDL 2.9 08/02/2013 1532   VLDL 17 08/02/2013 1532   LDLCALC 72 08/02/2013 1532    CBC    Component Value Date/Time   WBC 4.5 02/26/2014 0400   RBC 4.23 02/26/2014 0400   HGB 12.6 02/26/2014 0400   HCT 39.1 02/26/2014 0400   PLT 109* 02/26/2014 0400   MCV 92.4 02/26/2014 0400   MCH 29.8 02/26/2014 0400   MCHC 32.2 02/26/2014 0400   RDW 13.7 02/26/2014 0400   LYMPHSABS 0.7 02/25/2014 1918   MONOABS 0.4 02/25/2014 1918   EOSABS 0.0 02/25/2014 1918   BASOSABS 0.0 02/25/2014 1918    Hgb A1C No results found for: HGBA1C       Assessment & Plan:   Poor appetite:  Start drinking boost TID She is already on Remeron and has continue to lose weight  RTC in 6 months for Medicare Wellness exam

## 2014-12-03 NOTE — Assessment & Plan Note (Signed)
Will check TSH and Free T4 today Will adjust Synthroid if needed based on labs 

## 2014-12-03 NOTE — Assessment & Plan Note (Signed)
Will recheck Lipid profile today Continue statin at this time

## 2014-12-03 NOTE — Assessment & Plan Note (Signed)
Continue Klonipin

## 2014-12-03 NOTE — Assessment & Plan Note (Signed)
Controlled off meds.

## 2014-12-03 NOTE — Patient Instructions (Signed)

## 2014-12-03 NOTE — Assessment & Plan Note (Signed)
I would like to take her off the Seroquel but her daughter refuses Medications refilled today

## 2014-12-03 NOTE — Assessment & Plan Note (Signed)
Currently not an issue Will continue to monitor 

## 2014-12-03 NOTE — Assessment & Plan Note (Signed)
Will check CMET today Continue Lipitor

## 2014-12-03 NOTE — Assessment & Plan Note (Signed)
She will continue Bisoprolol and Diltiazem No anticoag secondary to fall risk

## 2014-12-04 LAB — CBC
HCT: 44.2 % (ref 36.0–46.0)
Hemoglobin: 14.5 g/dL (ref 12.0–15.0)
MCHC: 32.8 g/dL (ref 30.0–36.0)
MCV: 93.1 fl (ref 78.0–100.0)
Platelets: 130 10*3/uL — ABNORMAL LOW (ref 150.0–400.0)
RBC: 4.74 Mil/uL (ref 3.87–5.11)
RDW: 15.1 % (ref 11.5–15.5)
WBC: 4.6 10*3/uL (ref 4.0–10.5)

## 2014-12-04 LAB — LIPID PANEL
CHOL/HDL RATIO: 2
Cholesterol: 131 mg/dL (ref 0–200)
HDL: 53.9 mg/dL (ref >39.00)
LDL Cholesterol: 68 mg/dL (ref 0–99)
NONHDL: 76.73
TRIGLYCERIDES: 46 mg/dL (ref 0.0–149.0)
VLDL: 9.2 mg/dL (ref 0.0–40.0)

## 2014-12-04 LAB — COMPREHENSIVE METABOLIC PANEL
ALBUMIN: 3.7 g/dL (ref 3.5–5.2)
ALK PHOS: 106 U/L (ref 39–117)
ALT: 22 U/L (ref 0–35)
AST: 32 U/L (ref 0–37)
BUN: 11 mg/dL (ref 6–23)
CO2: 34 mEq/L — ABNORMAL HIGH (ref 19–32)
CREATININE: 1.03 mg/dL (ref 0.40–1.20)
Calcium: 9.4 mg/dL (ref 8.4–10.5)
Chloride: 96 mEq/L (ref 96–112)
GFR: 53.47 mL/min — ABNORMAL LOW (ref >60.00)
Glucose, Bld: 97 mg/dL (ref 70–99)
Potassium: 4.8 mEq/L (ref 3.5–5.1)
SODIUM: 134 meq/L — AB (ref 135–145)
Total Bilirubin: 0.7 mg/dL (ref 0.2–1.2)
Total Protein: 6.7 g/dL (ref 6.0–8.3)

## 2014-12-04 LAB — T4, FREE: Free T4: 1.08 ng/dL (ref 0.60–1.60)

## 2014-12-04 LAB — TSH: TSH: 5.5 u[IU]/mL — ABNORMAL HIGH (ref 0.35–4.50)

## 2014-12-07 ENCOUNTER — Other Ambulatory Visit: Payer: Self-pay | Admitting: Internal Medicine

## 2014-12-07 NOTE — Telephone Encounter (Signed)
Please call in.  Routed to PCP as FYI for future refills. Thanks.

## 2014-12-07 NOTE — Telephone Encounter (Signed)
Baity pt--Rx last filled 11/08/2014--please advise if okay to call in to not be filled until on or after 12/09/2014--please advise

## 2014-12-07 NOTE — Telephone Encounter (Signed)
Medication phoned to pharmacy.  

## 2014-12-12 ENCOUNTER — Telehealth: Payer: Self-pay

## 2014-12-12 NOTE — Telephone Encounter (Signed)
Jasmine [name] (DPR signed) [facility]; advised too early to request refill. Jasmine [name] will call back closer to time to request refill of clonazepam.

## 2014-12-20 ENCOUNTER — Encounter: Payer: Self-pay | Admitting: Physician Assistant

## 2014-12-20 ENCOUNTER — Ambulatory Visit (INDEPENDENT_AMBULATORY_CARE_PROVIDER_SITE_OTHER): Payer: Medicare Other | Admitting: Physician Assistant

## 2014-12-20 VITALS — BP 130/70 | HR 80 | Ht 66.0 in | Wt 112.1 lb

## 2014-12-20 DIAGNOSIS — I482 Chronic atrial fibrillation, unspecified: Secondary | ICD-10-CM

## 2014-12-20 DIAGNOSIS — E785 Hyperlipidemia, unspecified: Secondary | ICD-10-CM | POA: Diagnosis not present

## 2014-12-20 DIAGNOSIS — I739 Peripheral vascular disease, unspecified: Principal | ICD-10-CM

## 2014-12-20 DIAGNOSIS — I2510 Atherosclerotic heart disease of native coronary artery without angina pectoris: Secondary | ICD-10-CM

## 2014-12-20 DIAGNOSIS — I779 Disorder of arteries and arterioles, unspecified: Secondary | ICD-10-CM

## 2014-12-20 DIAGNOSIS — I10 Essential (primary) hypertension: Secondary | ICD-10-CM | POA: Diagnosis not present

## 2014-12-20 DIAGNOSIS — I2583 Coronary atherosclerosis due to lipid rich plaque: Secondary | ICD-10-CM

## 2014-12-20 NOTE — Progress Notes (Signed)
Patient ID: Jasmine [name], female   DOB: 1922/05/27, 81 y.o.   MRN: [id number]     Date:  12/20/2014   ID:  Jasmine [name], DOB [birth year], MRN [id number]  PCP:  Nicki Reaper, NP  Primary Cardiologist:  Allyson Sabal   Chief Complaint  Patient presents with  . Follow-up  . Shortness of Breath    every once in a while      History of Present Illness: Jasmine [name] is a 81 y.o. female  who is accompanied by one of her daughters today. She has a history of CAD and PVOD. She had coronary artery bypass grafting in 1997 as well as carotid endarterectomy at that time.  She had a heart catheterization December 09, 2003, revealing patent grafts and normal LV function. Her other problems include hypertension and hyperlipidemia. She developed A-fib with RVR and we have been adjusting her medications.  She spontaneously converted to sinus rhythm. Her last stress test performed April 03, 2011, was unremarkable.  The Xarelto was discontinued because of fall risk. She was admitted to [facility]  November 2015 because of syncope. Her EKG revealed atrial fibrillation. She was thought to have orthostatic hypotension and her blood pressure medicines were adjusted.  The patient is here today for follow-up.  She doesn't have any particular complaints other than not being able to get around as well as she used to.   She currently denies nausea, vomiting, fever, chest pain, shortness of breath, orthopnea, dizziness, PND, cough, congestion, abdominal pain, hematochezia, melena, lower extremity edema, claudication.  Wt Readings from Last 3 Encounters:  12/20/14 112 lb 1.6 oz (50.848 kg)  12/03/14 106 lb (48.081 kg)  06/01/14 114 lb (51.71 kg)     Past Medical History  Diagnosis Date  . Insomnia due to medical condition   . Anxiety   . Hyperlipidemia   . Hypertension   . Hypothyroidism   . Low back pain   . Left wrist fracture 09/2007  . Fracture of right shoulder 02/2008  . GERD (gastroesophageal reflux  disease)   . Barrett's esophagus   . Hemorrhoids, external   . CAD (coronary artery disease) of artery bypass graft 1995  . Hiatal hernia   . History of colon polyps   . Atrial fibrillation with RVR   . Syncope     Current Outpatient Prescriptions  Medication Sig Dispense Refill  . acetaminophen (TYLENOL) 500 MG tablet Take 500 mg by mouth every 6 (six) hours as needed for mild pain. For pain    . aspirin 81 MG tablet Take 81 mg by mouth daily.      Marland Kitchen atorvastatin (LIPITOR) 80 MG tablet TAKE 1 TABLET BY MOUTH DAILY 90 tablet 1  . bisoprolol (ZEBETA) 5 MG tablet TAKE 1 TABLET DAILY 90 tablet 0  . Calcium Carbonate-Vitamin D (CALCIUM 600-D) 600-400 MG-UNIT per tablet Take 1 tablet by mouth daily.     . clonazePAM (KLONOPIN) 1 MG tablet TAKE 1/2 TABLET BY MOUTH EVERY MORNING, 1/2 TABLET AT 6PM, AND 1 TABLET AT BEDTIME 60 tablet 0  . cyanocobalamin 500 MCG tablet Take 500 mcg by mouth daily.      Marland Kitchen diltiazem (TIAZAC) 120 MG 24 hr capsule Take 1 capsule (120 mg total) by mouth daily. 90 capsule 2  . levothyroxine (SYNTHROID, LEVOTHROID) 88 MCG tablet TAKE 1 TABLET DAILY 90 tablet 1  . lubiprostone (AMITIZA) 24 MCG capsule Take 1 capsule (24 mcg total) by mouth 2 (two) times daily with  a meal. 180 capsule 3  . mirtazapine (REMERON) 45 MG tablet TAKE 1 TABLET AT BEDTIME 90 tablet 0  . Multiple Vitamin (MULTIVITAMIN) capsule Take 1 capsule by mouth daily.      Marland Kitchen omeprazole (PRILOSEC) 20 MG capsule Take 1 capsule (20 mg total) by mouth daily. 90 capsule 2  . QUEtiapine (SEROQUEL) 25 MG tablet Take 1 tablet (25 mg total) by mouth at bedtime. 90 tablet 1  . traMADol (ULTRAM) 50 MG tablet Take 1 tablet (50 mg total) by mouth every 8 (eight) hours as needed for pain. 150 tablet 3  . vitamin E 400 UNIT capsule Take 400 Units by mouth daily.       No current facility-administered medications for this visit.    Allergies:    Allergies  Allergen Reactions  . Codeine   . Meperidine Hcl   .  Oxycodone-Acetaminophen     Social History:  The patient  reports that she quit smoking about 46 years ago. She has never used smokeless tobacco. She reports that she does not drink alcohol or use illicit drugs.   Family history:   Family History  Problem Relation Age of Onset  . Diabetes Son   . Heart disease Mother   . Heart disease Father     ROS:  Please see the history of present illness.  All other systems reviewed and negative.   PHYSICAL EXAM: VS:  BP 130/70 mmHg  Pulse 80  Ht  (1.676 m)  Wt 112 lb 1.6 oz (50.848 kg)  BMI 18.10 kg/m2  thin, well developed, in no acute distress HEENT: Pupils are equal round react to light accommodation extraocular movements are intact.  Neck: no JVDNo cervical lymphadenopathy. Cardiac:  Irregular rate and rhythm without murmurs rubs or gallops. Lungs:  clear to auscultation bilaterally, no wheezing, rhonchi or rales Abd: soft,  Mildly tender right upper quadrant positive bowel sounds all quadrants, no hepatosplenomegaly Ext: no lower extremity edema.  2+ radial and dorsalis pedis pulses. Skin: warm and dry Neuro:  Grossly normal  EKG:   Atrial fibrillation with a rate of 80 bpm  ASSESSMENT AND PLAN:  Problem List Items Addressed This Visit    HTN (hypertension) (Chronic)   HLD (hyperlipidemia) (Chronic)   Chronic atrial fibrillation   Carotid artery disease - Primary   CAD - CABG '97, cath OK 2005, Myoview low risk 12/12 (Chronic)       Hyperlipidemia   contiue statin  Essential hypertension   blood pressure controlled. No changes in medications.   Chronic atrial fibrillation  EKG shows atrial fibrillation the rate of 80 bpm.  chadsvasc 5.   Xarelto discontinued due to fall risk.  Coronary artery disease coronary artery bypass grafting 1997   no complaints of angina  Carotid artery disease status post carotid endarterectomy 1997 Her last carotid dopplers were November 2015 the right CEA. Patent the left showed moderate  to severe calcified plaque.  1-39% ICA stenosis

## 2014-12-20 NOTE — Patient Instructions (Signed)
Your physician wants you to follow-up with Dr.Berry in 6 months. You will receive a reminder letter in the mail two months in advance. If you don't receive a letter, please call our office to schedule the follow-up appointment.

## 2014-12-23 ENCOUNTER — Other Ambulatory Visit: Payer: Self-pay | Admitting: Internal Medicine

## 2015-01-01 DIAGNOSIS — H01001 Unspecified blepharitis right upper eyelid: Secondary | ICD-10-CM | POA: Diagnosis not present

## 2015-01-01 DIAGNOSIS — H52221 Regular astigmatism, right eye: Secondary | ICD-10-CM | POA: Diagnosis not present

## 2015-01-01 DIAGNOSIS — Z961 Presence of intraocular lens: Secondary | ICD-10-CM | POA: Diagnosis not present

## 2015-01-01 DIAGNOSIS — H5201 Hypermetropia, right eye: Secondary | ICD-10-CM | POA: Diagnosis not present

## 2015-01-01 DIAGNOSIS — H2702 Aphakia, left eye: Secondary | ICD-10-CM | POA: Diagnosis not present

## 2015-01-01 DIAGNOSIS — H3531 Nonexudative age-related macular degeneration: Secondary | ICD-10-CM | POA: Diagnosis not present

## 2015-01-09 ENCOUNTER — Other Ambulatory Visit: Payer: Self-pay | Admitting: Family Medicine

## 2015-01-09 NOTE — Telephone Encounter (Signed)
Ok to phone in Clonazepam 

## 2015-01-09 NOTE — Telephone Encounter (Signed)
Rx called in to pharmacy. 

## 2015-01-09 NOTE — Telephone Encounter (Signed)
Last filled 12/07/2014--please advise

## 2015-01-17 ENCOUNTER — Encounter: Payer: Self-pay | Admitting: Internal Medicine

## 2015-01-17 ENCOUNTER — Ambulatory Visit (INDEPENDENT_AMBULATORY_CARE_PROVIDER_SITE_OTHER): Payer: Medicare Other | Admitting: Internal Medicine

## 2015-01-17 VITALS — BP 120/70 | HR 104 | Temp 98.1°F | Wt 109.0 lb

## 2015-01-17 DIAGNOSIS — R5383 Other fatigue: Secondary | ICD-10-CM | POA: Diagnosis not present

## 2015-01-17 DIAGNOSIS — I2510 Atherosclerotic heart disease of native coronary artery without angina pectoris: Secondary | ICD-10-CM

## 2015-01-17 DIAGNOSIS — R0602 Shortness of breath: Secondary | ICD-10-CM | POA: Diagnosis not present

## 2015-01-17 DIAGNOSIS — R630 Anorexia: Secondary | ICD-10-CM | POA: Diagnosis not present

## 2015-01-17 DIAGNOSIS — R05 Cough: Secondary | ICD-10-CM | POA: Diagnosis not present

## 2015-01-17 DIAGNOSIS — R059 Cough, unspecified: Secondary | ICD-10-CM

## 2015-01-17 MED ORDER — TRAMADOL HCL 50 MG PO TABS: 50.0000 mg | ORAL_TABLET | Freq: Three times a day (TID) | ORAL | Status: DC | PRN

## 2015-01-17 MED ORDER — DOXYCYCLINE HYCLATE 100 MG PO TABS: 100.0000 mg | ORAL_TABLET | Freq: Two times a day (BID) | ORAL | Status: DC

## 2015-01-17 NOTE — Progress Notes (Signed)
Subjective:    Patient ID: Jasmine [name], female    DOB: 08-Apr-1923, 81 y.o.   MRN: [id number]  HPI Jasmine [name] is a 81 year old female who presents today with chief complaint of headache, loss of appetite, cough and fatigue.  Her headaches started 2 weeks ago and she took some leftover tramadol with relief.  She also has had a productive cough and runny nose for 4 days.  She has no energy, denies fever.     Review of Systems  Constitutional: Positive for activity change and fatigue. Negative for fever and chills.  HENT: Positive for congestion, postnasal drip and rhinorrhea. Negative for ear pain and sore throat.   Respiratory: Positive for cough.   Cardiovascular: Negative for chest pain, palpitations and leg swelling.  Gastrointestinal: Negative for abdominal pain, diarrhea and constipation.  Genitourinary: Negative for dysuria, frequency and flank pain.  Musculoskeletal: Negative for myalgias, back pain and gait problem.  Skin: Negative.   Psychiatric/Behavioral: Negative for confusion and agitation. The patient is not nervous/anxious.    Family History  Problem Relation Age of Onset  . Diabetes Son   . Heart disease Mother   . Heart disease Father    Current Outpatient Prescriptions on File Prior to Visit  Medication Sig Dispense Refill  . acetaminophen (TYLENOL) 500 MG tablet Take 500 mg by mouth every 6 (six) hours as needed for mild pain. For pain    . aspirin 81 MG tablet Take 81 mg by mouth daily.      Marland Kitchen atorvastatin (LIPITOR) 80 MG tablet TAKE 1 TABLET BY MOUTH DAILY 90 tablet 1  . bisoprolol (ZEBETA) 5 MG tablet TAKE 1 TABLET DAILY 90 tablet 0  . Calcium Carbonate-Vitamin D (CALCIUM 600-D) 600-400 MG-UNIT per tablet Take 1 tablet by mouth daily.     . clonazePAM (KLONOPIN) 1 MG tablet TAKE 1/2 TABLET BY MOUTH EVERY MORNING, 1/2 TABLET AT 6PM, AND 1 TABLET AT BEDTIME 60 tablet 0  . cyanocobalamin 500 MCG tablet Take 500 mcg by mouth daily.      Marland Kitchen diltiazem (TIAZAC) 120  MG 24 hr capsule Take 1 capsule (120 mg total) by mouth daily. 90 capsule 2  . levothyroxine (SYNTHROID, LEVOTHROID) 88 MCG tablet TAKE 1 TABLET DAILY 90 tablet 1  . lubiprostone (AMITIZA) 24 MCG capsule Take 1 capsule (24 mcg total) by mouth 2 (two) times daily with a meal. (Patient taking differently: Take 24 mcg by mouth daily with breakfast. ) 180 capsule 3  . mirtazapine (REMERON) 45 MG tablet TAKE 1 TABLET AT BEDTIME 90 tablet 0  . Multiple Vitamin (MULTIVITAMIN) capsule Take 1 capsule by mouth daily.      Marland Kitchen omeprazole (PRILOSEC) 20 MG capsule Take 1 capsule (20 mg total) by mouth daily. 90 capsule 2  . QUEtiapine (SEROQUEL) 25 MG tablet Take 1 tablet (25 mg total) by mouth at bedtime. 90 tablet 1  . vitamin E 400 UNIT capsule Take 400 Units by mouth daily.       No current facility-administered medications on file prior to visit.        Objective:   Physical Exam  Constitutional: She is oriented to person, place, and time.  Frail elderly female  HENT:  Head: Normocephalic and atraumatic.  Right Ear: External ear normal.  Left Ear: External ear normal.  Mouth/Throat: Oropharynx is clear and moist. No oropharyngeal exudate.  Eyes: Conjunctivae are normal. Pupils are equal, round, and reactive to light.  Neck: Normal range of  motion. Neck supple.  Cardiovascular: Normal rate.  An irregular rhythm present.  Pulmonary/Chest: She has decreased breath sounds in the left upper field, the left middle field and the left lower field.  Abdominal: Soft. Bowel sounds are normal.  Musculoskeletal: Normal range of motion.  Neurological: She is alert and oriented to person, place, and time.  Skin: Skin is warm and dry.    BP 120/70 mmHg  Pulse 104  Temp(Src) 98.1 F (36.7 C) (Oral)  Wt 109 lb (49.442 kg)  SpO2 95%       Assessment & Plan:  1. Lower Respiratory Infection Rx for Doxycycline 100 mg po bid for 10 days.  Increase fluid intake and rest. Contact office if not feeling  better in 3 days.

## 2015-01-17 NOTE — Patient Instructions (Signed)
Cough, Adult  A cough is a reflex that helps clear your throat and airways. It can help heal the body or may be a reaction to an irritated airway. A cough may only last 2 or 3 weeks (acute) or may last more than 8 weeks (chronic).  CAUSES Acute cough:  Viral or bacterial infections. Chronic cough:  Infections.  Allergies.  Asthma.  Post-nasal drip.  Smoking.  Heartburn or acid reflux.  Some medicines.  Chronic lung problems (COPD).  Cancer. SYMPTOMS   Cough.  Fever.  Chest pain.  Increased breathing rate.  High-pitched whistling sound when breathing (wheezing).  Colored mucus that you cough up (sputum). TREATMENT   A bacterial cough may be treated with antibiotic medicine.  A viral cough must run its course and will not respond to antibiotics.  Your caregiver may recommend other treatments if you have a chronic cough. HOME CARE INSTRUCTIONS   Only take over-the-counter or prescription medicines for pain, discomfort, or fever as directed by your caregiver. Use cough suppressants only as directed by your caregiver.  Use a cold steam vaporizer or humidifier in your bedroom or home to help loosen secretions.  Sleep in a semi-upright position if your cough is worse at night.  Rest as needed.  Stop smoking if you smoke. SEEK IMMEDIATE MEDICAL CARE IF:   You have pus in your sputum.  Your cough starts to worsen.  You cannot control your cough with suppressants and are losing sleep.  You begin coughing up blood.  You have difficulty breathing.  You develop pain which is getting worse or is uncontrolled with medicine.  You have a fever. MAKE SURE YOU:   Understand these instructions.  Will watch your condition.  Will get help right away if you are not doing well or get worse. Document Released: 10/03/2010 Document Revised: 06/29/2011 Document Reviewed: 10/03/2010 [employer] Patient Information 2015 [employer]. This information is not intended  to replace advice given to you by your health care provider. Make sure you discuss any questions you have with your health care provider.  

## 2015-01-17 NOTE — Progress Notes (Signed)
Pre visit review using our clinic review tool, if applicable. No additional management support is needed unless otherwise documented below in the visit note. 

## 2015-01-17 NOTE — Progress Notes (Signed)
Subjective:    Patient ID: Jasmine [name], female    DOB: 06-18-1922, 81 y.o.   MRN: [id number]  HPI  Pt presents to the clinic today with c/o headache, fatigue, and loss of appetite. This started about 2 weeks ago. She describes the headache as throbbing. The pain is mostly in her forehead. She has had a runny nose and slight cough. The cough is productive of thick white mucous at times. She denies shortness of breath. She has no history of seasonal allergies or breathing problems. She has not had sick contacts that she is aware of.  Review of Systems  Past Medical History  Diagnosis Date  . Insomnia due to medical condition   . Anxiety   . Hyperlipidemia   . Hypertension   . Hypothyroidism   . Low back pain   . Left wrist fracture 09/2007  . Fracture of right shoulder 02/2008  . GERD (gastroesophageal reflux disease)   . Barrett's esophagus   . Hemorrhoids, external   . CAD (coronary artery disease) of artery bypass graft 1995  . Hiatal hernia   . History of colon polyps   . Atrial fibrillation with RVR   . Syncope     Current Outpatient Prescriptions  Medication Sig Dispense Refill  . acetaminophen (TYLENOL) 500 MG tablet Take 500 mg by mouth every 6 (six) hours as needed for mild pain. For pain    . aspirin 81 MG tablet Take 81 mg by mouth daily.      Marland Kitchen atorvastatin (LIPITOR) 80 MG tablet TAKE 1 TABLET BY MOUTH DAILY 90 tablet 1  . bisoprolol (ZEBETA) 5 MG tablet TAKE 1 TABLET DAILY 90 tablet 0  . Calcium Carbonate-Vitamin D (CALCIUM 600-D) 600-400 MG-UNIT per tablet Take 1 tablet by mouth daily.     . clonazePAM (KLONOPIN) 1 MG tablet TAKE 1/2 TABLET BY MOUTH EVERY MORNING, 1/2 TABLET AT 6PM, AND 1 TABLET AT BEDTIME 60 tablet 0  . cyanocobalamin 500 MCG tablet Take 500 mcg by mouth daily.      Marland Kitchen diltiazem (TIAZAC) 120 MG 24 hr capsule Take 1 capsule (120 mg total) by mouth daily. 90 capsule 2  . levothyroxine (SYNTHROID, LEVOTHROID) 88 MCG tablet TAKE 1 TABLET DAILY 90  tablet 1  . lubiprostone (AMITIZA) 24 MCG capsule Take 1 capsule (24 mcg total) by mouth 2 (two) times daily with a meal. (Patient taking differently: Take 24 mcg by mouth daily with breakfast. ) 180 capsule 3  . mirtazapine (REMERON) 45 MG tablet TAKE 1 TABLET AT BEDTIME 90 tablet 0  . Multiple Vitamin (MULTIVITAMIN) capsule Take 1 capsule by mouth daily.      Marland Kitchen omeprazole (PRILOSEC) 20 MG capsule Take 1 capsule (20 mg total) by mouth daily. 90 capsule 2  . QUEtiapine (SEROQUEL) 25 MG tablet Take 1 tablet (25 mg total) by mouth at bedtime. 90 tablet 1  . traMADol (ULTRAM) 50 MG tablet Take 1 tablet (50 mg total) by mouth every 8 (eight) hours as needed for pain. 150 tablet 3  . vitamin E 400 UNIT capsule Take 400 Units by mouth daily.       No current facility-administered medications for this visit.    Allergies  Allergen Reactions  . Codeine   . Meperidine Hcl   . Oxycodone-Acetaminophen     Family History  Problem Relation Age of Onset  . Diabetes Son   . Heart disease Mother   . Heart disease Father     Social  History   Social History  . Marital Status: Widowed    Spouse Name: N/A  . Number of Children: N/A  . Years of Education: N/A   Occupational History  . Not on file.   Social History Main Topics  . Smoking status: Former Smoker    Quit date: 04/20/1968  . Smokeless tobacco: Never Used     Comment: QUIT IN 1970  . Alcohol Use: No     Comment: STOPPED IN 1970  . Drug Use: No  . Sexual Activity: Not Currently   Other Topics Concern  . Not on file   Social History Narrative     Constitutional: Pt reports headache, fatigue. Denies fever, or abrupt weight changes.  HEENT: Pt reports runny nose. Denies eye pain, eye redness, ear pain, ringing in the ears, wax buildup, nasal congestion, bloody nose, or sore throat. Respiratory: Pt reports cough. Denies difficulty breathing, shortness of breath, cough or sputum production.   Cardiovascular: Denies chest pain,  chest tightness, palpitations or swelling in the hands or feet.  Gastrointestinal: Pt reports loss of appetite. Denies abdominal pain, bloating, constipation, diarrhea or blood in the stool.  GU: Denies urgency, frequency, pain with urination, burning sensation, blood in urine, odor or discharge.  No other specific complaints in a complete review of systems (except as listed in HPI above).     Objective:   Physical Exam   BP 120/70 mmHg  Pulse 104  Temp(Src) 98.1 F (36.7 C) (Oral)  Wt 109 lb (49.442 kg)  SpO2 95% Wt Readings from Last 3 Encounters:  01/17/15 109 lb (49.442 kg)  12/20/14 112 lb 1.6 oz (50.848 kg)  12/03/14 106 lb (48.081 kg)    General: Appears her stated age, NAD. HEENT: Head: normal shape and size; Eyes: sclera white, no icterus, conjunctiva pink, PERRLA and EOMs intact; Ears: Tm's gray and intact, normal light reflex; Nose: mucosa pink and moist, septum midline; Throat/Mouth: Teeth present, mucosa pink and moist, no exudate, lesions or ulcerations noted.  Neck:  No adenopathy noted. Cardiovascular: Normal rate with irregular rhythm. S1,S2 noted.  No murmur, rubs or gallops noted.  Pulmonary/Chest: Normal effort and diminished breath sounds L>R. No respiratory distress. No wheezes, rales or ronchi noted.  Abdomen: Soft and nontender.  Neurological: Alert and oriented.    BMET    Component Value Date/Time   NA 134* 12/03/2014 1655   K 4.8 12/03/2014 1655   CL 96 12/03/2014 1655   CO2 34* 12/03/2014 1655   GLUCOSE 97 12/03/2014 1655   BUN 11 12/03/2014 1655   CREATININE 1.03 12/03/2014 1655   CREATININE 1.00 01/15/2014 1528   CALCIUM 9.4 12/03/2014 1655   GFRNONAA 47* 02/26/2014 0400   GFRAA 54* 02/26/2014 0400    Lipid Panel     Component Value Date/Time   CHOL 131 12/03/2014 1655   TRIG 46.0 12/03/2014 1655   HDL 53.90 12/03/2014 1655   CHOLHDL 2 12/03/2014 1655   VLDL 9.2 12/03/2014 1655   LDLCALC 68 12/03/2014 1655    CBC    Component  Value Date/Time   WBC 4.6 12/03/2014 1655   RBC 4.74 12/03/2014 1655   HGB 14.5 12/03/2014 1655   HCT 44.2 12/03/2014 1655   PLT 130.0* 12/03/2014 1655   MCV 93.1 12/03/2014 1655   MCH 29.8 02/26/2014 0400   MCHC 32.8 12/03/2014 1655   RDW 15.1 12/03/2014 1655   LYMPHSABS 0.7 02/25/2014 1918   MONOABS 0.4 02/25/2014 1918   EOSABS 0.0 02/25/2014 1918  BASOSABS 0.0 02/25/2014 1918    Hgb A1C No results found for: HGBA1C      Assessment & Plan:   Fatigue, loss of appetite, cough, shortness of breath:  Concerning for early bronchitis/pneumonia Will treat with Doxycycline 100 mg BID x 10 days Delsym OTC for cough  RTC as needed or if symptoms persist or worsen

## 2015-01-22 ENCOUNTER — Emergency Department (HOSPITAL_COMMUNITY): Payer: Medicare Other

## 2015-01-22 ENCOUNTER — Observation Stay (HOSPITAL_COMMUNITY)
Admission: EM | Admit: 2015-01-22 | Discharge: 2015-01-25 | Disposition: A | Discharge status: Home / Self Care | Payer: Medicare Other | Attending: Internal Medicine | Admitting: Internal Medicine

## 2015-01-22 ENCOUNTER — Encounter (HOSPITAL_COMMUNITY): Payer: Self-pay | Admitting: Emergency Medicine

## 2015-01-22 DIAGNOSIS — I10 Essential (primary) hypertension: Secondary | ICD-10-CM | POA: Diagnosis present

## 2015-01-22 DIAGNOSIS — S0003XA Contusion of scalp, initial encounter: Secondary | ICD-10-CM | POA: Diagnosis not present

## 2015-01-22 DIAGNOSIS — S062X0A Diffuse traumatic brain injury without loss of consciousness, initial encounter: Secondary | ICD-10-CM | POA: Diagnosis not present

## 2015-01-22 DIAGNOSIS — R0902 Hypoxemia: Secondary | ICD-10-CM | POA: Diagnosis not present

## 2015-01-22 DIAGNOSIS — I482 Chronic atrial fibrillation, unspecified: Secondary | ICD-10-CM | POA: Diagnosis present

## 2015-01-22 DIAGNOSIS — S06320A Contusion and laceration of left cerebrum without loss of consciousness, initial encounter: Secondary | ICD-10-CM | POA: Diagnosis not present

## 2015-01-22 DIAGNOSIS — Z7982 Long term (current) use of aspirin: Secondary | ICD-10-CM | POA: Diagnosis not present

## 2015-01-22 DIAGNOSIS — Y9289 Other specified places as the place of occurrence of the external cause: Secondary | ICD-10-CM | POA: Diagnosis not present

## 2015-01-22 DIAGNOSIS — N183 Chronic kidney disease, stage 3 unspecified: Secondary | ICD-10-CM | POA: Diagnosis present

## 2015-01-22 DIAGNOSIS — S0990XA Unspecified injury of head, initial encounter: Secondary | ICD-10-CM

## 2015-01-22 DIAGNOSIS — S06339A Contusion and laceration of cerebrum, unspecified, with loss of consciousness of unspecified duration, initial encounter: Secondary | ICD-10-CM

## 2015-01-22 DIAGNOSIS — W19XXXA Unspecified fall, initial encounter: Secondary | ICD-10-CM | POA: Diagnosis not present

## 2015-01-22 DIAGNOSIS — I4891 Unspecified atrial fibrillation: Secondary | ICD-10-CM | POA: Diagnosis not present

## 2015-01-22 DIAGNOSIS — I509 Heart failure, unspecified: Secondary | ICD-10-CM

## 2015-01-22 DIAGNOSIS — K449 Diaphragmatic hernia without obstruction or gangrene: Secondary | ICD-10-CM | POA: Diagnosis not present

## 2015-01-22 DIAGNOSIS — S098XXA Other specified injuries of head, initial encounter: Secondary | ICD-10-CM | POA: Diagnosis not present

## 2015-01-22 DIAGNOSIS — E785 Hyperlipidemia, unspecified: Secondary | ICD-10-CM | POA: Diagnosis not present

## 2015-01-22 DIAGNOSIS — Y9389 Activity, other specified: Secondary | ICD-10-CM | POA: Diagnosis not present

## 2015-01-22 DIAGNOSIS — F419 Anxiety disorder, unspecified: Secondary | ICD-10-CM | POA: Insufficient documentation

## 2015-01-22 DIAGNOSIS — Z86010 Personal history of colonic polyps: Secondary | ICD-10-CM | POA: Diagnosis not present

## 2015-01-22 DIAGNOSIS — W01198A Fall on same level from slipping, tripping and stumbling with subsequent striking against other object, initial encounter: Secondary | ICD-10-CM | POA: Diagnosis not present

## 2015-01-22 DIAGNOSIS — I25810 Atherosclerosis of coronary artery bypass graft(s) without angina pectoris: Secondary | ICD-10-CM | POA: Insufficient documentation

## 2015-01-22 DIAGNOSIS — S0093XA Contusion of unspecified part of head, initial encounter: Secondary | ICD-10-CM | POA: Diagnosis not present

## 2015-01-22 DIAGNOSIS — K2270 Barrett's esophagus without dysplasia: Secondary | ICD-10-CM | POA: Diagnosis not present

## 2015-01-22 DIAGNOSIS — I5032 Chronic diastolic (congestive) heart failure: Secondary | ICD-10-CM

## 2015-01-22 DIAGNOSIS — E039 Hypothyroidism, unspecified: Secondary | ICD-10-CM | POA: Diagnosis not present

## 2015-01-22 DIAGNOSIS — S06330A Contusion and laceration of cerebrum, unspecified, without loss of consciousness, initial encounter: Secondary | ICD-10-CM

## 2015-01-22 DIAGNOSIS — Z79899 Other long term (current) drug therapy: Secondary | ICD-10-CM | POA: Insufficient documentation

## 2015-01-22 DIAGNOSIS — Z87891 Personal history of nicotine dependence: Secondary | ICD-10-CM | POA: Insufficient documentation

## 2015-01-22 DIAGNOSIS — R0602 Shortness of breath: Secondary | ICD-10-CM | POA: Diagnosis not present

## 2015-01-22 DIAGNOSIS — Y998 Other external cause status: Secondary | ICD-10-CM | POA: Diagnosis not present

## 2015-01-22 DIAGNOSIS — D696 Thrombocytopenia, unspecified: Secondary | ICD-10-CM | POA: Diagnosis present

## 2015-01-22 DIAGNOSIS — K219 Gastro-esophageal reflux disease without esophagitis: Secondary | ICD-10-CM | POA: Insufficient documentation

## 2015-01-22 DIAGNOSIS — J9691 Respiratory failure, unspecified with hypoxia: Secondary | ICD-10-CM | POA: Diagnosis present

## 2015-01-22 DIAGNOSIS — S0633AA Contusion and laceration of cerebrum, unspecified, with loss of consciousness status unknown, initial encounter: Secondary | ICD-10-CM

## 2015-01-22 DIAGNOSIS — I5020 Unspecified systolic (congestive) heart failure: Secondary | ICD-10-CM

## 2015-01-22 LAB — URINALYSIS, ROUTINE W REFLEX MICROSCOPIC
BILIRUBIN URINE: NEGATIVE
Glucose, UA: NEGATIVE mg/dL
KETONES UR: NEGATIVE mg/dL
Leukocytes, UA: NEGATIVE
Nitrite: NEGATIVE
PROTEIN: NEGATIVE mg/dL
Specific Gravity, Urine: 1.011 (ref 1.005–1.030)
UROBILINOGEN UA: 1 mg/dL (ref 0.0–1.0)
pH: 7 (ref 5.0–8.0)

## 2015-01-22 LAB — CBC WITH DIFFERENTIAL/PLATELET
BASOS PCT: 0 %
Basophils Absolute: 0 10*3/uL (ref 0.0–0.1)
EOS ABS: 0 10*3/uL (ref 0.0–0.7)
Eosinophils Relative: 0 %
HCT: 45.6 % (ref 36.0–46.0)
HEMOGLOBIN: 14.8 g/dL (ref 12.0–15.0)
Lymphocytes Relative: 5 %
Lymphs Abs: 0.6 10*3/uL — ABNORMAL LOW (ref 0.7–4.0)
MCH: 30.3 pg (ref 26.0–34.0)
MCHC: 32.5 g/dL (ref 30.0–36.0)
MCV: 93.3 fL (ref 78.0–100.0)
MONOS PCT: 7 %
Monocytes Absolute: 0.9 10*3/uL (ref 0.1–1.0)
NEUTROS PCT: 88 %
Neutro Abs: 11.3 10*3/uL — ABNORMAL HIGH (ref 1.7–7.7)
Platelets: 158 10*3/uL (ref 150–400)
RBC: 4.89 MIL/uL (ref 3.87–5.11)
RDW: 14.1 % (ref 11.5–15.5)
WBC: 12.8 10*3/uL — ABNORMAL HIGH (ref 4.0–10.5)

## 2015-01-22 LAB — BASIC METABOLIC PANEL
Anion gap: 11 (ref 5–15)
BUN: 8 mg/dL (ref 6–20)
CALCIUM: 8.9 mg/dL (ref 8.9–10.3)
CO2: 30 mmol/L (ref 22–32)
CREATININE: 0.97 mg/dL (ref 0.44–1.00)
Chloride: 91 mmol/L — ABNORMAL LOW (ref 101–111)
GFR calc non Af Amer: 50 mL/min — ABNORMAL LOW (ref >60)
GFR, EST AFRICAN AMERICAN: 58 mL/min — AB (ref >60)
Glucose, Bld: 103 mg/dL — ABNORMAL HIGH (ref 65–99)
Potassium: 4.5 mmol/L (ref 3.5–5.1)
SODIUM: 132 mmol/L — AB (ref 135–145)

## 2015-01-22 LAB — URINE MICROSCOPIC-ADD ON

## 2015-01-22 LAB — BRAIN NATRIURETIC PEPTIDE: B NATRIURETIC PEPTIDE 5: 417.6 pg/mL — AB (ref 0.0–100.0)

## 2015-01-22 LAB — I-STAT CG4 LACTIC ACID, ED: Lactic Acid, Venous: 2.79 mmol/L (ref 0.5–2.0)

## 2015-01-22 MED ORDER — SODIUM CHLORIDE 0.9 % IJ SOLN
3.0000 mL | Freq: Two times a day (BID) | INTRAMUSCULAR | Status: DC
  Administered 2015-01-22 – 2015-01-24 (×4): 3 mL via INTRAVENOUS

## 2015-01-22 MED ORDER — VITAMIN B-12 1000 MCG PO TABS
500.0000 ug | ORAL_TABLET | Freq: Every day | ORAL | Status: DC
  Administered 2015-01-23 – 2015-01-25 (×3): 500 ug via ORAL
  Filled 2015-01-22 (×3): qty 1

## 2015-01-22 MED ORDER — CLONAZEPAM 0.5 MG PO TABS
0.5000 mg | ORAL_TABLET | Freq: Two times a day (BID) | ORAL | Status: DC | PRN
  Administered 2015-01-24: 0.5 mg via ORAL
  Filled 2015-01-22: qty 1

## 2015-01-22 MED ORDER — FUROSEMIDE 10 MG/ML IJ SOLN
20.0000 mg | Freq: Once | INTRAMUSCULAR | Status: AC
  Administered 2015-01-22: 20 mg via INTRAVENOUS
  Filled 2015-01-22: qty 2

## 2015-01-22 MED ORDER — FUROSEMIDE 10 MG/ML IJ SOLN
20.0000 mg | Freq: Every day | INTRAMUSCULAR | Status: DC
  Administered 2015-01-23: 20 mg via INTRAVENOUS
  Filled 2015-01-22: qty 2

## 2015-01-22 MED ORDER — LEVOTHYROXINE SODIUM 88 MCG PO TABS
88.0000 ug | ORAL_TABLET | Freq: Every day | ORAL | Status: DC
  Administered 2015-01-23 – 2015-01-25 (×3): 88 ug via ORAL
  Filled 2015-01-22 (×3): qty 1

## 2015-01-22 MED ORDER — ATORVASTATIN CALCIUM 80 MG PO TABS
80.0000 mg | ORAL_TABLET | Freq: Every day | ORAL | Status: DC
  Administered 2015-01-23 – 2015-01-24 (×2): 80 mg via ORAL
  Filled 2015-01-22 (×2): qty 1

## 2015-01-22 MED ORDER — ENSURE ENLIVE PO LIQD
237.0000 mL | Freq: Two times a day (BID) | ORAL | Status: DC
  Administered 2015-01-23: 237 mL via ORAL

## 2015-01-22 MED ORDER — QUETIAPINE FUMARATE 25 MG PO TABS
25.0000 mg | ORAL_TABLET | Freq: Every day | ORAL | Status: DC
  Administered 2015-01-22 – 2015-01-24 (×3): 25 mg via ORAL
  Filled 2015-01-22 (×3): qty 1

## 2015-01-22 MED ORDER — DILTIAZEM HCL ER BEADS 120 MG PO CP24
120.0000 mg | ORAL_CAPSULE | Freq: Every day | ORAL | Status: DC
  Administered 2015-01-23 – 2015-01-25 (×3): 120 mg via ORAL
  Filled 2015-01-22 (×6): qty 1

## 2015-01-22 MED ORDER — TRAMADOL HCL 50 MG PO TABS
50.0000 mg | ORAL_TABLET | Freq: Three times a day (TID) | ORAL | Status: DC | PRN
  Administered 2015-01-23: 50 mg via ORAL
  Filled 2015-01-22: qty 1

## 2015-01-22 MED ORDER — PANTOPRAZOLE SODIUM 40 MG PO TBEC
40.0000 mg | DELAYED_RELEASE_TABLET | Freq: Every day | ORAL | Status: DC
  Administered 2015-01-23 – 2015-01-25 (×3): 40 mg via ORAL
  Filled 2015-01-22 (×3): qty 1

## 2015-01-22 MED ORDER — SODIUM CHLORIDE 0.9 % IV SOLN: 250.0000 mL | INTRAVENOUS | Status: DC | PRN

## 2015-01-22 MED ORDER — LUBIPROSTONE 24 MCG PO CAPS
24.0000 ug | ORAL_CAPSULE | Freq: Every day | ORAL | Status: DC
  Administered 2015-01-23 – 2015-01-25 (×3): 24 ug via ORAL
  Filled 2015-01-22 (×4): qty 1

## 2015-01-22 MED ORDER — ASPIRIN EC 81 MG PO TBEC
81.0000 mg | DELAYED_RELEASE_TABLET | Freq: Every day | ORAL | Status: DC
  Administered 2015-01-23 – 2015-01-25 (×3): 81 mg via ORAL
  Filled 2015-01-22 (×3): qty 1

## 2015-01-22 MED ORDER — ONDANSETRON HCL 4 MG/2ML IJ SOLN: 4.0000 mg | Freq: Four times a day (QID) | INTRAMUSCULAR | Status: DC | PRN

## 2015-01-22 MED ORDER — ONDANSETRON HCL 4 MG PO TABS: 4.0000 mg | ORAL_TABLET | Freq: Four times a day (QID) | ORAL | Status: DC | PRN

## 2015-01-22 MED ORDER — BISOPROLOL FUMARATE 5 MG PO TABS
5.0000 mg | ORAL_TABLET | Freq: Every day | ORAL | Status: DC
  Administered 2015-01-23 – 2015-01-25 (×3): 5 mg via ORAL
  Filled 2015-01-22 (×3): qty 1

## 2015-01-22 MED ORDER — CALCIUM CARBONATE-VITAMIN D 500-200 MG-UNIT PO TABS
1.0000 | ORAL_TABLET | Freq: Every day | ORAL | Status: DC
  Administered 2015-01-23 – 2015-01-25 (×3): 1 via ORAL
  Filled 2015-01-22 (×3): qty 1

## 2015-01-22 MED ORDER — VITAMIN E 180 MG (400 UNIT) PO CAPS
400.0000 [IU] | ORAL_CAPSULE | Freq: Every day | ORAL | Status: DC
  Administered 2015-01-23 – 2015-01-24 (×2): 400 [IU] via ORAL
  Filled 2015-01-22 (×4): qty 1

## 2015-01-22 MED ORDER — SODIUM CHLORIDE 0.9 % IJ SOLN: 3.0000 mL | INTRAMUSCULAR | Status: DC | PRN

## 2015-01-22 MED ORDER — DOXYCYCLINE HYCLATE 100 MG PO TABS
100.0000 mg | ORAL_TABLET | Freq: Two times a day (BID) | ORAL | Status: DC
  Administered 2015-01-22 – 2015-01-25 (×6): 100 mg via ORAL
  Filled 2015-01-22 (×6): qty 1

## 2015-01-22 MED ORDER — MIRTAZAPINE 7.5 MG PO TABS
45.0000 mg | ORAL_TABLET | Freq: Every day | ORAL | Status: DC
  Administered 2015-01-22: 45 mg via ORAL
  Filled 2015-01-22: qty 6

## 2015-01-22 MED ORDER — ADULT MULTIVITAMIN W/MINERALS CH
1.0000 | ORAL_TABLET | Freq: Every day | ORAL | Status: DC
  Administered 2015-01-23 – 2015-01-25 (×3): 1 via ORAL
  Filled 2015-01-22 (×3): qty 1

## 2015-01-22 MED ORDER — SODIUM CHLORIDE 0.9 % IJ SOLN
3.0000 mL | Freq: Two times a day (BID) | INTRAMUSCULAR | Status: DC
  Administered 2015-01-23 – 2015-01-25 (×3): 3 mL via INTRAVENOUS

## 2015-01-22 NOTE — H&P (Addendum)
[facility] History and Physical  GENAE STRINE [name]:[id number] DOB: 03-25-1923 DOA: 01/22/2015  Referring physician: Gray Bernhardt, MD PCP: Nicki Reaper, NP   Chief Complaint: Fall  HPI: Jasmine [name] is a 81 y.o. female with history of atrial fibrillation anxiety HLD HTN Hypothyroidism GERD presents after a fall. Apparently the patient took a fall while she was trying to throw away some trash. Patient hit the back of her head. Patient did not lose consciousness. She did not have any chest pain. She did not have any seizure. She did not have any loss of bladder control. EMS was called and on arrival she was noted to be hypoxic with saturations in the 80s. Patient was recently seen by her PCP and has been on doxycyline for a bronchitis. Patient had a CXR done in the ED and this shows presence of CHF. She had an Echo done last year which had shown an EF in the 55-60% range.   Review of Systems:  12 point ROS performed and is unremarkable other than HPI.   Past Medical History  Diagnosis Date  . Insomnia due to medical condition   . Anxiety   . Hyperlipidemia   . Hypertension   . Hypothyroidism   . Low back pain   . Left wrist fracture 09/2007  . Fracture of right shoulder 02/2008  . GERD (gastroesophageal reflux disease)   . Barrett's esophagus   . Hemorrhoids, external   . CAD (coronary artery disease) of artery bypass graft 1995  . Hiatal hernia   . History of colon polyps   . Atrial fibrillation with RVR (HCC)   . Syncope    Past Surgical History  Procedure Laterality Date  . Colonoscopy w/ polypectomy  06/2005  . Carotid endarterectomy    . Cataract extraction    . Carotid doppler Bilateral 05/15/2010    Right Bulb/CEA-demonstrated trace irregular nonhemodynamically significant plaque 0-49%. Left Bulb/Proximal ICA-demonstrated irregular nonhemodynamically signifcant plaque 0-49%.  . Cardiac catheterization  02/25/1996    No intervention-recommend CABG. Left main-80%  concentric distal stenosis, LAD-99% ostial stenosis, first diag-80% segmental proximal stenosis, Circumflex-80% ostial stenosis, RCA-90% ostial stenosis.  . Cardiac catheterization  05/21/2003    No intervention-widely patent grafts.  . Coronary artery bypass graft  1997  . Lexiscan myoview  04/03/2011    No scintigraphic evidence of inducible myocardial ischemia. No lexiscan EKG changes. Non-diagnostic for ischemia.   . Transthoracic echocardiogram  01/08/2011    EF 50-55%, LA moderately dilated, moderate tricuspid regurg, mild pulmonary hypertension.   Social History:  reports that she quit smoking about 46 years ago. She has never used smokeless tobacco. She reports that she does not drink alcohol or use illicit drugs.  Allergies  Allergen Reactions  . Codeine     unknown  . Meperidine Hcl     unknown  . Oxycodone-Acetaminophen     unknown    Family History  Problem Relation Age of Onset  . Diabetes Son   . Heart disease Mother   . Heart disease Father      Prior to Admission medications   Medication Sig Start Date End Date Taking? Authorizing Provider  acetaminophen (TYLENOL) 500 MG tablet Take 500 mg by mouth every 6 (six) hours as needed for mild pain. For pain   Yes Historical Provider, MD  aspirin 81 MG tablet Take 81 mg by mouth daily.     Yes Historical Provider, MD  atorvastatin (LIPITOR) 80 MG tablet TAKE 1 TABLET BY MOUTH  DAILY 12/03/14  Yes Lorre Munroe, NP  bisoprolol (ZEBETA) 5 MG tablet TAKE 1 TABLET DAILY 11/30/14  Yes Lorre Munroe, NP  Calcium Carbonate-Vitamin D (CALCIUM 600-D) 600-400 MG-UNIT per tablet Take 1 tablet by mouth daily.    Yes Historical Provider, MD  clonazePAM (KLONOPIN) 1 MG tablet TAKE 1/2 TABLET BY MOUTH EVERY MORNING, 1/2 TABLET AT 6PM, AND 1 TABLET AT BEDTIME 01/09/15  Yes Lorre Munroe, NP  cyanocobalamin 500 MCG tablet Take 500 mcg by mouth daily.     Yes Historical Provider, MD  diltiazem (TIAZAC) 120 MG 24 hr capsule Take 1 capsule  (120 mg total) by mouth daily. 07/04/14  Yes Runell Gess, MD  doxycycline (VIBRA-TABS) 100 MG tablet Take 1 tablet (100 mg total) by mouth 2 (two) times daily. 01/17/15  Yes Lorre Munroe, NP  levothyroxine (SYNTHROID, LEVOTHROID) 88 MCG tablet TAKE 1 TABLET DAILY 12/25/14  Yes Lorre Munroe, NP  lubiprostone (AMITIZA) 24 MCG capsule Take 1 capsule (24 mcg total) by mouth 2 (two) times daily with a meal. Patient taking differently: Take 24 mcg by mouth daily with breakfast.  12/23/12  Yes Stacie Glaze, MD  mirtazapine (REMERON) 45 MG tablet TAKE 1 TABLET AT BEDTIME 11/28/14  Yes Lorre Munroe, NP  Multiple Vitamin (MULTIVITAMIN) capsule Take 1 capsule by mouth daily.     Yes Historical Provider, MD  omeprazole (PRILOSEC) 20 MG capsule Take 1 capsule (20 mg total) by mouth daily. 07/06/14  Yes Lorre Munroe, NP  QUEtiapine (SEROQUEL) 25 MG tablet Take 1 tablet (25 mg total) by mouth at bedtime. 12/03/14  Yes Lorre Munroe, NP  traMADol (ULTRAM) 50 MG tablet Take 1 tablet (50 mg total) by mouth every 8 (eight) hours as needed. Patient taking differently: Take 50 mg by mouth every 8 (eight) hours as needed for moderate pain.  01/17/15  Yes Lorre Munroe, NP  vitamin E 400 UNIT capsule Take 400 Units by mouth daily.     Yes Historical Provider, MD   Physical Exam: Filed Vitals:   01/22/15 1600 01/22/15 1630 01/22/15 1746 01/22/15 1947  BP: 136/75 136/83    Pulse: 106     Temp:   99.8 F (37.7 C)   TempSrc:   Rectal   Resp: 22 23    Weight:    47.713 kg (105 lb 3 oz)  SpO2: 97%       Wt Readings from Last 3 Encounters:  01/22/15 47.713 kg (105 lb 3 oz)  01/17/15 49.442 kg (109 lb)  12/20/14 50.848 kg (112 lb 1.6 oz)    General:  Appears calm and comfortable Eyes: PERRL, normal lids, irises & conjunctiva ENT: grossly normal hearing, lips & tongue Neck: no LAD, masses or thyromegaly Cardiovascular: IRR, no m/r/g. No LE edema. Telemetry: atrial fib Respiratory: CTA bilaterally, no  w/r/r. Normal respiratory effort. Abdomen: soft, ntnd Skin: no rash or induration seen on limited exam Musculoskeletal: grossly normal tone BUE/BLE Psychiatric: grossly normal mood and affect, speech fluent and appropriate Neurologic: grossly non-focal.          Labs on Admission:  Basic Metabolic Panel:  Recent Labs Lab 01/22/15 1816  NA 132*  K 4.5  CL 91*  CO2 30  GLUCOSE 103*  BUN 8  CREATININE 0.97  CALCIUM 8.9   Liver Function Tests: No results for input(s): AST, ALT, ALKPHOS, BILITOT, PROT, ALBUMIN in the last 168 hours. No results for input(s): LIPASE, AMYLASE in the  last 168 hours. No results for input(s): AMMONIA in the last 168 hours. CBC:  Recent Labs Lab 01/22/15 1816  WBC 12.8*  NEUTROABS 11.3*  HGB 14.8  HCT 45.6  MCV 93.3  PLT 158   Cardiac Enzymes: No results for input(s): CKTOTAL, CKMB, CKMBINDEX, TROPONINI in the last 168 hours.  BNP (last 3 results)  Recent Labs  01/22/15 1817  BNP 417.6*    ProBNP (last 3 results) No results for input(s): PROBNP in the last 8760 hours.  CBG: No results for input(s): GLUCAP in the last 168 hours.  Radiological Exams on Admission: Ct Head Wo Contrast  01/22/2015   CLINICAL DATA:  Scalp hematoma after falling. No reported loss of consciousness.  EXAM: CT HEAD WITHOUT CONTRAST  CT CERVICAL SPINE WITHOUT CONTRAST  TECHNIQUE: Multidetector CT imaging of the head and cervical spine was performed following the standard protocol without intravenous contrast. Multiplanar CT image reconstructions of the cervical spine were also generated.  COMPARISON:  CT scan of February 25, 2014.  FINDINGS: CT HEAD FINDINGS  Bony calvarium appears intact. Mild diffuse cortical atrophy is noted. Mild chronic ischemic white matter disease is noted. Large left occipital scalp hematoma is noted. No mass effect or midline shift is noted. Ventricular size is within normal limits. There is no evidence of mass lesion or acute infarction.  4 mm hyperechoic focus is noted in the right parafalcine area of right parietal cortex concerning for small contusion.  CT CERVICAL SPINE FINDINGS  No fracture or spondylolisthesis is noted. Mild degenerative disc disease is noted at C4-5, C5-6 and C6-7 with anterior osteophyte formation. Visualized lung apices appear normal.  IMPRESSION: Multilevel degenerative disc disease is noted in the cervical spine. No fracture or significant spondylolisthesis is noted.  Mild diffuse cortical atrophy. Mild chronic ischemic white matter disease. Large left occipital scalp hematoma. Small hyperdense focus noted in right parafalcine area of right parietal cortex concerning for small contusion. Critical Value/emergent results were called by telephone at the time of interpretation on 01/22/2015 at 6:07 pm to Dr. Mancel Bale , who verbally acknowledged these results.   Electronically Signed   By: Lupita Raider, M.D.   On: 01/22/2015 18:07   Ct Cervical Spine Wo Contrast  01/22/2015   CLINICAL DATA:  Scalp hematoma after falling. No reported loss of consciousness.  EXAM: CT HEAD WITHOUT CONTRAST  CT CERVICAL SPINE WITHOUT CONTRAST  TECHNIQUE: Multidetector CT imaging of the head and cervical spine was performed following the standard protocol without intravenous contrast. Multiplanar CT image reconstructions of the cervical spine were also generated.  COMPARISON:  CT scan of February 25, 2014.  FINDINGS: CT HEAD FINDINGS  Bony calvarium appears intact. Mild diffuse cortical atrophy is noted. Mild chronic ischemic white matter disease is noted. Large left occipital scalp hematoma is noted. No mass effect or midline shift is noted. Ventricular size is within normal limits. There is no evidence of mass lesion or acute infarction. 4 mm hyperechoic focus is noted in the right parafalcine area of right parietal cortex concerning for small contusion.  CT CERVICAL SPINE FINDINGS  No fracture or spondylolisthesis is noted. Mild  degenerative disc disease is noted at C4-5, C5-6 and C6-7 with anterior osteophyte formation. Visualized lung apices appear normal.  IMPRESSION: Multilevel degenerative disc disease is noted in the cervical spine. No fracture or significant spondylolisthesis is noted.  Mild diffuse cortical atrophy. Mild chronic ischemic white matter disease. Large left occipital scalp hematoma. Small hyperdense focus noted in right  parafalcine area of right parietal cortex concerning for small contusion. Critical Value/emergent results were called by telephone at the time of interpretation on 01/22/2015 at 6:07 pm to Dr. Mancel Bale , who verbally acknowledged these results.   Electronically Signed   By: Lupita Raider, M.D.   On: 01/22/2015 18:07   Dg Chest Port 1 View  01/22/2015   CLINICAL DATA:  Shortness of breath.  EXAM: PORTABLE CHEST 1 VIEW  COMPARISON:  03/26/2011.  FINDINGS: Mildly enlarged cardiac silhouette with a mild increase in size. Stable post CABG changes. Increased prominence of the pulmonary vasculature and interstitial markings. Interval small bilateral pleural effusions. Diffuse osteopenia. Old, healed right humeral head and neck fracture.  IMPRESSION: Interval changes of acute congestive heart failure.   Electronically Signed   By: Beckie Salts M.D.   On: 01/22/2015 15:45      Assessment/Plan Active Problems:   Hypothyroidism   HLD (hyperlipidemia)   HTN (hypertension)   Chronic atrial fibrillation (HCC)   Fall   Cerebral contusion (HCC)   Systolic heart failure (HCC)   1. Fall with Cerebral Contusion -patient is being admitted for observation -Neurosurgery to see patient -CT results have been reviewed  2. CHF -will start on IV lasix and monitor IOs -follow up CXR  3. Atrial Fibrillation -rate controlled -not on anticoagulation  4. HTN -will continue with antihypertensives -monitor pressures  5. HLD -will continue with statins  6. Hypothyroid -will continue with  synthroid -will check TSH     Code Status: full code (must indicate code status--if unknown or must be presumed, indicate so) DVT Prophylaxis:SCD Family Communication: none (indicate person spoken with, if applicable, with phone number if by telephone) Disposition Plan: home (indicate anticipated LOS)    [facility] Pager [phone]

## 2015-01-22 NOTE — ED Notes (Signed)
MD aware of Lactic Acid, does not want to call code sepsis.

## 2015-01-22 NOTE — ED Provider Notes (Signed)
CSN: [id number]     Arrival date & time 01/22/15  1506 History   First MD Initiated Contact with Patient 01/22/15 1510     Chief Complaint  Patient presents with  . Fall  . Head Injury  . Shortness of Breath     (Consider location/radiation/quality/duration/timing/severity/associated sxs/prior Treatment) HPI   Jasmine [name] is a 81 y.o. female who presents for evaluation of fall. She was putting a plate in the garbage when she fell, striking the back of her head. She did not get up after the fall. EMS was summoned and brought her here. Patient's daughter apparently heard her fall, and went to help her. There is no alteration of mentation. Patient was in respiratory distress. When EMS arrived and found to have a saturation in the low 80s so she was given and placed on oxygen by facemask, with improvement of the respiratory status. The patient denies neck or back pain. She denies presyncope. She denies alteration of oral intake, or diarrhea. She is taking her usual medications. Family members state that she has had a cough, for 2 weeks and was given antibody by her PCP. There are no other known modifying factors.   Past Medical History  Diagnosis Date  . Insomnia due to medical condition   . Anxiety   . Hyperlipidemia   . Hypertension   . Hypothyroidism   . Low back pain   . Left wrist fracture 09/2007  . Fracture of right shoulder 02/2008  . GERD (gastroesophageal reflux disease)   . Barrett's esophagus   . Hemorrhoids, external   . CAD (coronary artery disease) of artery bypass graft 1995  . Hiatal hernia   . History of colon polyps   . Atrial fibrillation with RVR (HCC)   . Syncope    Past Surgical History  Procedure Laterality Date  . Colonoscopy w/ polypectomy  06/2005  . Carotid endarterectomy    . Cataract extraction    . Carotid doppler Bilateral 05/15/2010    Right Bulb/CEA-demonstrated trace irregular nonhemodynamically significant plaque 0-49%. Left Bulb/Proximal  ICA-demonstrated irregular nonhemodynamically signifcant plaque 0-49%.  . Cardiac catheterization  02/25/1996    No intervention-recommend CABG. Left main-80% concentric distal stenosis, LAD-99% ostial stenosis, first diag-80% segmental proximal stenosis, Circumflex-80% ostial stenosis, RCA-90% ostial stenosis.  . Cardiac catheterization  05/21/2003    No intervention-widely patent grafts.  . Coronary artery bypass graft  1997  . Lexiscan myoview  04/03/2011    No scintigraphic evidence of inducible myocardial ischemia. No lexiscan EKG changes. Non-diagnostic for ischemia.   . Transthoracic echocardiogram  01/08/2011    EF 50-55%, LA moderately dilated, moderate tricuspid regurg, mild pulmonary hypertension.   Family History  Problem Relation Age of Onset  . Diabetes Son   . Heart disease Mother   . Heart disease Father    Social History  Substance Use Topics  . Smoking status: Former Smoker    Quit date: 04/20/1968  . Smokeless tobacco: Never Used     Comment: QUIT IN 1970  . Alcohol Use: No     Comment: STOPPED IN 1970   OB History    No data available     Review of Systems  All other systems reviewed and are negative.     Allergies  Codeine; Meperidine hcl; and Oxycodone-acetaminophen  Home Medications   Prior to Admission medications   Medication Sig Start Date End Date Taking? Authorizing Provider  acetaminophen (TYLENOL) 500 MG tablet Take 500 mg by mouth every 6 (  six) hours as needed for mild pain. For pain   Yes Historical Provider, MD  aspirin 81 MG tablet Take 81 mg by mouth daily.     Yes Historical Provider, MD  atorvastatin (LIPITOR) 80 MG tablet TAKE 1 TABLET BY MOUTH DAILY 12/03/14  Yes Lorre Munroe, NP  bisoprolol (ZEBETA) 5 MG tablet TAKE 1 TABLET DAILY 11/30/14  Yes Lorre Munroe, NP  Calcium Carbonate-Vitamin D (CALCIUM 600-D) 600-400 MG-UNIT per tablet Take 1 tablet by mouth daily.    Yes Historical Provider, MD  clonazePAM (KLONOPIN) 1 MG tablet TAKE  1/2 TABLET BY MOUTH EVERY MORNING, 1/2 TABLET AT 6PM, AND 1 TABLET AT BEDTIME 01/09/15  Yes Lorre Munroe, NP  cyanocobalamin 500 MCG tablet Take 500 mcg by mouth daily.     Yes Historical Provider, MD  diltiazem (TIAZAC) 120 MG 24 hr capsule Take 1 capsule (120 mg total) by mouth daily. 07/04/14  Yes Runell Gess, MD  doxycycline (VIBRA-TABS) 100 MG tablet Take 1 tablet (100 mg total) by mouth 2 (two) times daily. 01/17/15  Yes Lorre Munroe, NP  levothyroxine (SYNTHROID, LEVOTHROID) 88 MCG tablet TAKE 1 TABLET DAILY 12/25/14  Yes Lorre Munroe, NP  lubiprostone (AMITIZA) 24 MCG capsule Take 1 capsule (24 mcg total) by mouth 2 (two) times daily with a meal. Patient taking differently: Take 24 mcg by mouth daily with breakfast.  12/23/12  Yes Stacie Glaze, MD  mirtazapine (REMERON) 45 MG tablet TAKE 1 TABLET AT BEDTIME 11/28/14  Yes Lorre Munroe, NP  Multiple Vitamin (MULTIVITAMIN) capsule Take 1 capsule by mouth daily.     Yes Historical Provider, MD  omeprazole (PRILOSEC) 20 MG capsule Take 1 capsule (20 mg total) by mouth daily. 07/06/14  Yes Lorre Munroe, NP  QUEtiapine (SEROQUEL) 25 MG tablet Take 1 tablet (25 mg total) by mouth at bedtime. 12/03/14  Yes Lorre Munroe, NP  traMADol (ULTRAM) 50 MG tablet Take 1 tablet (50 mg total) by mouth every 8 (eight) hours as needed. Patient taking differently: Take 50 mg by mouth every 8 (eight) hours as needed for moderate pain.  01/17/15  Yes Lorre Munroe, NP  vitamin E 400 UNIT capsule Take 400 Units by mouth daily.     Yes Historical Provider, MD   BP 136/83 mmHg  Pulse 106  Temp(Src) 99.8 F (37.7 C) (Rectal)  Resp 23  Wt 105 lb 3 oz (47.713 kg)  SpO2 97% Physical Exam  Constitutional: She is oriented to person, place, and time. She appears well-developed.  Elderly, frail  HENT:  Head: Normocephalic.  Right Ear: External ear normal.  Left Ear: External ear normal.  Hematoma left occiput, about 6 x 6 x 4 cm., no scalp abrasion or  laceration.  Eyes: Conjunctivae and EOM are normal. Pupils are equal, round, and reactive to light.  Neck: Normal range of motion and phonation normal. Neck supple.  Cardiovascular: Normal rate, regular rhythm and normal heart sounds.   Pulmonary/Chest: Effort normal and breath sounds normal. She exhibits no bony tenderness.  Abdominal: Soft. There is no tenderness.  Musculoskeletal: Normal range of motion.  Kyphosis. Nontender cervical, thoracic and lumbar spines.  Neurological: She is alert and oriented to person, place, and time. No cranial nerve deficit or sensory deficit. She exhibits normal muscle tone. Coordination normal.  No dysarthria and aphasia or nystagmus. Normal strength in arms and legs bilaterally.  Skin: Skin is warm, dry and intact.  Psychiatric: She has  a normal mood and affect. Her behavior is normal.  Nursing note and vitals reviewed.   ED Course  Procedures (including critical care time)  Medications  furosemide (LASIX) injection 20 mg (20 mg Intravenous Given 01/22/15 2000)    Patient Vitals for the past 24 hrs:  BP Temp Temp src Pulse Resp SpO2 Weight  01/22/15 1947 - - - - - - 105 lb 3 oz (47.713 kg)  01/22/15 1746 - 99.8 F (37.7 C) Rectal - - - -  01/22/15 1630 136/83 mmHg - - - 23 - -  01/22/15 1600 136/75 mmHg - - 106 22 97 % -  01/22/15 1530 169/89 mmHg - - (!) 122 (!) 30 95 % -  01/22/15 1525 - 97.4 F (36.3 C) Oral - - - -  01/22/15 1521 - - - (!) 131 (!) 31 97 % -  01/22/15 1516 163/98 mmHg - - - - - -    8:22 PM Reevaluation with update and discussion. After initial assessment and treatment, an updated evaluation reveals patient feels weak, no additional complaints. Findings discussed with patient and family members. Yexalen Deike L   20:35- case discussed with neurosurgery, he will see the patient in the ED for evaluation. Is unlikely that she will require intervention  8:18 PM-Consult complete with Dr. Welton Flakes. Patient case explained and  discussed. He agrees to admit patient for further evaluation and treatment. Call ended at 2045  CRITICAL CARE Performed by: Flint Melter Total critical care time: 40 minutes Critical care time was exclusive of separately billable procedures and treating other patients. Critical care was necessary to treat or prevent imminent or life-threatening deterioration. Critical care was time spent personally by me on the following activities: development of treatment plan with patient and/or surrogate as well as nursing, discussions with consultants, evaluation of patient's response to treatment, examination of patient, obtaining history from patient or surrogate, ordering and performing treatments and interventions, ordering and review of laboratory studies, ordering and review of radiographic studies, pulse oximetry and re-evaluation of patient's condition.   Labs Review Labs Reviewed  BASIC METABOLIC PANEL - Abnormal; Notable for the following:    Sodium 132 (*)    Chloride 91 (*)    Glucose, Bld 103 (*)    GFR calc non Af Amer 50 (*)    GFR calc Af Amer 58 (*)    All other components within normal limits  CBC WITH DIFFERENTIAL/PLATELET - Abnormal; Notable for the following:    WBC 12.8 (*)    Neutro Abs 11.3 (*)    Lymphs Abs 0.6 (*)    All other components within normal limits  URINALYSIS, ROUTINE W REFLEX MICROSCOPIC (NOT AT [facility]) - Abnormal; Notable for the following:    APPearance HAZY (*)    Hgb urine dipstick MODERATE (*)    All other components within normal limits  BRAIN NATRIURETIC PEPTIDE - Abnormal; Notable for the following:    B Natriuretic Peptide 417.6 (*)    All other components within normal limits  I-STAT CG4 LACTIC ACID, ED - Abnormal; Notable for the following:    Lactic Acid, Venous 2.79 (*)    All other components within normal limits  URINE CULTURE  URINE MICROSCOPIC-ADD ON  I-STAT CG4 LACTIC ACID, ED    Imaging Review Ct Head Wo Contrast  01/22/2015    CLINICAL DATA:  Scalp hematoma after falling. No reported loss of consciousness.  EXAM: CT HEAD WITHOUT CONTRAST  CT CERVICAL SPINE WITHOUT CONTRAST  TECHNIQUE: Multidetector  CT imaging of the head and cervical spine was performed following the standard protocol without intravenous contrast. Multiplanar CT image reconstructions of the cervical spine were also generated.  COMPARISON:  CT scan of February 25, 2014.  FINDINGS: CT HEAD FINDINGS  Bony calvarium appears intact. Mild diffuse cortical atrophy is noted. Mild chronic ischemic white matter disease is noted. Large left occipital scalp hematoma is noted. No mass effect or midline shift is noted. Ventricular size is within normal limits. There is no evidence of mass lesion or acute infarction. 4 mm hyperechoic focus is noted in the right parafalcine area of right parietal cortex concerning for small contusion.  CT CERVICAL SPINE FINDINGS  No fracture or spondylolisthesis is noted. Mild degenerative disc disease is noted at C4-5, C5-6 and C6-7 with anterior osteophyte formation. Visualized lung apices appear normal.  IMPRESSION: Multilevel degenerative disc disease is noted in the cervical spine. No fracture or significant spondylolisthesis is noted.  Mild diffuse cortical atrophy. Mild chronic ischemic white matter disease. Large left occipital scalp hematoma. Small hyperdense focus noted in right parafalcine area of right parietal cortex concerning for small contusion. Critical Value/emergent results were called by telephone at the time of interpretation on 01/22/2015 at 6:07 pm to Dr. Mancel Bale , who verbally acknowledged these results.   Electronically Signed   By: Lupita Raider, M.D.   On: 01/22/2015 18:07   Ct Cervical Spine Wo Contrast  01/22/2015   CLINICAL DATA:  Scalp hematoma after falling. No reported loss of consciousness.  EXAM: CT HEAD WITHOUT CONTRAST  CT CERVICAL SPINE WITHOUT CONTRAST  TECHNIQUE: Multidetector CT imaging of the head and  cervical spine was performed following the standard protocol without intravenous contrast. Multiplanar CT image reconstructions of the cervical spine were also generated.  COMPARISON:  CT scan of February 25, 2014.  FINDINGS: CT HEAD FINDINGS  Bony calvarium appears intact. Mild diffuse cortical atrophy is noted. Mild chronic ischemic white matter disease is noted. Large left occipital scalp hematoma is noted. No mass effect or midline shift is noted. Ventricular size is within normal limits. There is no evidence of mass lesion or acute infarction. 4 mm hyperechoic focus is noted in the right parafalcine area of right parietal cortex concerning for small contusion.  CT CERVICAL SPINE FINDINGS  No fracture or spondylolisthesis is noted. Mild degenerative disc disease is noted at C4-5, C5-6 and C6-7 with anterior osteophyte formation. Visualized lung apices appear normal.  IMPRESSION: Multilevel degenerative disc disease is noted in the cervical spine. No fracture or significant spondylolisthesis is noted.  Mild diffuse cortical atrophy. Mild chronic ischemic white matter disease. Large left occipital scalp hematoma. Small hyperdense focus noted in right parafalcine area of right parietal cortex concerning for small contusion. Critical Value/emergent results were called by telephone at the time of interpretation on 01/22/2015 at 6:07 pm to Dr. Mancel Bale , who verbally acknowledged these results.   Electronically Signed   By: Lupita Raider, M.D.   On: 01/22/2015 18:07   Dg Chest Port 1 View  01/22/2015   CLINICAL DATA:  Shortness of breath.  EXAM: PORTABLE CHEST 1 VIEW  COMPARISON:  03/26/2011.  FINDINGS: Mildly enlarged cardiac silhouette with a mild increase in size. Stable post CABG changes. Increased prominence of the pulmonary vasculature and interstitial markings. Interval small bilateral pleural effusions. Diffuse osteopenia. Old, healed right humeral head and neck fracture.  IMPRESSION: Interval changes  of acute congestive heart failure.   Electronically Signed   By: Viviann Spare  Azucena Kuba M.D.   On: 01/22/2015 15:45   I have personally reviewed and evaluated these images and lab results as part of my medical decision-making.   EKG Interpretation   Date/Time:  Tuesday January 22 2015 15:20:12 EDT Ventricular Rate:  114 PR Interval:    QRS Duration: 64 QT Interval:  312 QTC Calculation: 430 R Axis:   -11 Text Interpretation:  Atrial fibrillation Ventricular premature complex  Low voltage, extremity leads Borderline repolarization abnormality since  last tracing no significant change Confirmed by Effie Shy  MD, Mechele Collin ([id number])  on 01/22/2015 3:25:54 PM      MDM   Final diagnoses:  Brain contusion, without loss of consciousness, initial encounter (HCC)  Head injury, initial encounter  Congestive heart failure, unspecified congestive heart failure chronicity, unspecified congestive heart failure type (HCC)  Fall, initial encounter  Hypoxia    Fall, with head injury, brain contusion and general achiness. CHF, present on chest x-ray. Doubt ACS, pneumonia, or PE. She has atrial fibrillation, but is not in a coagulated. Mild hypoxia d/t CHF. She will require overnight observation and additional diuresis with possible medication adjustment.  Nursing Notes Reviewed/ Care Coordinated, and agree without changes. Applicable Imaging Reviewed.  Interpretation of Laboratory Data incorporated into ED treatment  Plan: Admit    Mancel Bale, MD 01/22/15 2049

## 2015-01-22 NOTE — ED Notes (Signed)
Admitting at bedside 

## 2015-01-22 NOTE — ED Notes (Signed)
Pt from home via [facility] with c/p large hematoma to the back of her head s/p losing balance and falling.  Pt was on the floor approx 20 minutes prior to EMS arrival, at which time pt was SOB with respiratory rate of 30 and expiratory wheezing.  Recent dx of URI with ABX usage.  Pt given 5 mg albuterol with decrease in respirations to 22 and clear lungs.  Pt NAD, A&O.

## 2015-01-22 NOTE — Consult Note (Signed)
Reason for Consult:head injury, possible cerebral contusion Referring Physician: Abria, Jasmine [name] is an 81 y.o. female.  HPI: whom fell while throwing some trash away. She struck the back of her head, did not lose consciousness, nor have emesis. She was at home with her daughter, and does remember falling. Brought to ED at [facility], a Head CT revealed a 22m possible lesion in the frontal region on the right side. No skull fractures, shift, subdural blood, epidural blood were observed. I was asked to see patient.  Past Medical History  Diagnosis Date  . Insomnia due to medical condition   . Anxiety   . Hyperlipidemia   . Hypertension   . Hypothyroidism   . Low back pain   . Left wrist fracture 09/2007  . Fracture of right shoulder 02/2008  . GERD (gastroesophageal reflux disease)   . Barrett's esophagus   . Hemorrhoids, external   . CAD (coronary artery disease) of artery bypass graft 1995  . Hiatal hernia   . History of colon polyps   . Atrial fibrillation with RVR ([place]   . Syncope     Past Surgical History  Procedure Laterality Date  . Colonoscopy w/ polypectomy  06/2005  . Carotid endarterectomy    . Cataract extraction    . Carotid doppler Bilateral 05/15/2010    Right Bulb/CEA-demonstrated trace irregular nonhemodynamically significant plaque 0-49%. Left Bulb/Proximal ICA-demonstrated irregular nonhemodynamically signifcant plaque 0-49%.  . Cardiac catheterization  02/25/1996    No intervention-recommend CABG. Left main-80% concentric distal stenosis, LAD-99% ostial stenosis, first diag-80% segmental proximal stenosis, Circumflex-80% ostial stenosis, RCA-90% ostial stenosis.  . Cardiac catheterization  05/21/2003    No intervention-widely patent grafts.  . Coronary artery bypass graft  1997  . Lexiscan myoview  04/03/2011    No scintigraphic evidence of inducible myocardial ischemia. No lexiscan EKG changes. Non-diagnostic for ischemia.   . Transthoracic  echocardiogram  01/08/2011    EF 50-55%, LA moderately dilated, moderate tricuspid regurg, mild pulmonary hypertension.    Family History  Problem Relation Age of Onset  . Diabetes Son   . Heart disease Mother   . Heart disease Father     Social History:  reports that she quit smoking about 46 years ago. She has never used smokeless tobacco. She reports that she does not drink alcohol or use illicit drugs.  Allergies:  Allergies  Allergen Reactions  . Codeine     unknown  . Meperidine Hcl     unknown  . Oxycodone-Acetaminophen     unknown    Medications: I have reviewed the patient's current medications.  Results for orders placed or performed during the hospital encounter of 01/22/15 (from the past 48 hour(s))  Urinalysis, Routine w reflex microscopic     Status: Abnormal   Collection Time: 01/22/15  5:21 PM  Result Value Ref Range   Color, Urine YELLOW YELLOW   APPearance HAZY (A) CLEAR   Specific Gravity, Urine 1.011 1.005 - 1.030   pH 7.0 5.0 - 8.0   Glucose, UA NEGATIVE NEGATIVE mg/dL   Hgb urine dipstick MODERATE (A) NEGATIVE   Bilirubin Urine NEGATIVE NEGATIVE   Ketones, ur NEGATIVE NEGATIVE mg/dL   Protein, ur NEGATIVE NEGATIVE mg/dL   Urobilinogen, UA 1.0 0.0 - 1.0 mg/dL   Nitrite NEGATIVE NEGATIVE   Leukocytes, UA NEGATIVE NEGATIVE  Urine microscopic-add on     Status: None   Collection Time: 01/22/15  5:21 PM  Result Value Ref Range  Squamous Epithelial / LPF RARE RARE   WBC, UA 0-2 <3 WBC/hpf   RBC / HPF 11-20 <3 RBC/hpf   Bacteria, UA RARE RARE  Basic metabolic panel     Status: Abnormal   Collection Time: 01/22/15  6:16 PM  Result Value Ref Range   Sodium 132 (L) 135 - 145 mmol/L   Potassium 4.5 3.5 - 5.1 mmol/L   Chloride 91 (L) 101 - 111 mmol/L   CO2 30 22 - 32 mmol/L   Glucose, Bld 103 (H) 65 - 99 mg/dL   BUN 8 6 - 20 mg/dL   Creatinine, Ser 0.97 0.44 - 1.00 mg/dL   Calcium 8.9 8.9 - 10.3 mg/dL   GFR calc non Af Amer 50 (L) >60 mL/min    GFR calc Af Amer 58 (L) >60 mL/min    Comment: (NOTE) The eGFR has been calculated using the CKD EPI equation. This calculation has not been validated in all clinical situations. eGFR's persistently <60 mL/min signify possible Chronic Kidney Disease.    Anion gap 11 5 - 15  CBC with Differential     Status: Abnormal   Collection Time: 01/22/15  6:16 PM  Result Value Ref Range   WBC 12.8 (H) 4.0 - 10.5 K/uL   RBC 4.89 3.87 - 5.11 MIL/uL   Hemoglobin 14.8 12.0 - 15.0 g/dL   HCT 45.6 36.0 - 46.0 %   MCV 93.3 78.0 - 100.0 fL   MCH 30.3 26.0 - 34.0 pg   MCHC 32.5 30.0 - 36.0 g/dL   RDW 14.1 11.5 - 15.5 %   Platelets 158 150 - 400 K/uL   Neutrophils Relative % 88 %   Neutro Abs 11.3 (H) 1.7 - 7.7 K/uL   Lymphocytes Relative 5 %   Lymphs Abs 0.6 (L) 0.7 - 4.0 K/uL   Monocytes Relative 7 %   Monocytes Absolute 0.9 0.1 - 1.0 K/uL   Eosinophils Relative 0 %   Eosinophils Absolute 0.0 0.0 - 0.7 K/uL   Basophils Relative 0 %   Basophils Absolute 0.0 0.0 - 0.1 K/uL  Brain natriuretic peptide     Status: Abnormal   Collection Time: 01/22/15  6:17 PM  Result Value Ref Range   B Natriuretic Peptide 417.6 (H) 0.0 - 100.0 pg/mL  I-Stat CG4 Lactic Acid, ED     Status: Abnormal   Collection Time: 01/22/15  6:44 PM  Result Value Ref Range   Lactic Acid, Venous 2.79 (HH) 0.5 - 2.0 mmol/L   Comment NOTIFIED PHYSICIAN     Ct Head Wo Contrast  01/22/2015   CLINICAL DATA:  Scalp hematoma after falling. No reported loss of consciousness.  EXAM: CT HEAD WITHOUT CONTRAST  CT CERVICAL SPINE WITHOUT CONTRAST  TECHNIQUE: Multidetector CT imaging of the head and cervical spine was performed following the standard protocol without intravenous contrast. Multiplanar CT image reconstructions of the cervical spine were also generated.  COMPARISON:  CT scan of February 25, 2014.  FINDINGS: CT HEAD FINDINGS  Bony calvarium appears intact. Mild diffuse cortical atrophy is noted. Mild chronic ischemic white matter  disease is noted. Large left occipital scalp hematoma is noted. No mass effect or midline shift is noted. Ventricular size is within normal limits. There is no evidence of mass lesion or acute infarction. 4 mm hyperechoic focus is noted in the right parafalcine area of right parietal cortex concerning for small contusion.  CT CERVICAL SPINE FINDINGS  No fracture or spondylolisthesis is noted. Mild degenerative disc disease  is noted at C4-5, C5-6 and C6-7 with anterior osteophyte formation. Visualized lung apices appear normal.  IMPRESSION: Multilevel degenerative disc disease is noted in the cervical spine. No fracture or significant spondylolisthesis is noted.  Mild diffuse cortical atrophy. Mild chronic ischemic white matter disease. Large left occipital scalp hematoma. Small hyperdense focus noted in right parafalcine area of right parietal cortex concerning for small contusion. Critical Value/emergent results were called by telephone at the time of interpretation on 01/22/2015 at 6:07 pm to Dr. Daleen Bo , who verbally acknowledged these results.   Electronically Signed   By: Marijo Conception, M.D.   On: 01/22/2015 18:07   Ct Cervical Spine Wo Contrast  01/22/2015   CLINICAL DATA:  Scalp hematoma after falling. No reported loss of consciousness.  EXAM: CT HEAD WITHOUT CONTRAST  CT CERVICAL SPINE WITHOUT CONTRAST  TECHNIQUE: Multidetector CT imaging of the head and cervical spine was performed following the standard protocol without intravenous contrast. Multiplanar CT image reconstructions of the cervical spine were also generated.  COMPARISON:  CT scan of February 25, 2014.  FINDINGS: CT HEAD FINDINGS  Bony calvarium appears intact. Mild diffuse cortical atrophy is noted. Mild chronic ischemic white matter disease is noted. Large left occipital scalp hematoma is noted. No mass effect or midline shift is noted. Ventricular size is within normal limits. There is no evidence of mass lesion or acute infarction.  4 mm hyperechoic focus is noted in the right parafalcine area of right parietal cortex concerning for small contusion.  CT CERVICAL SPINE FINDINGS  No fracture or spondylolisthesis is noted. Mild degenerative disc disease is noted at C4-5, C5-6 and C6-7 with anterior osteophyte formation. Visualized lung apices appear normal.  IMPRESSION: Multilevel degenerative disc disease is noted in the cervical spine. No fracture or significant spondylolisthesis is noted.  Mild diffuse cortical atrophy. Mild chronic ischemic white matter disease. Large left occipital scalp hematoma. Small hyperdense focus noted in right parafalcine area of right parietal cortex concerning for small contusion. Critical Value/emergent results were called by telephone at the time of interpretation on 01/22/2015 at 6:07 pm to Dr. Daleen Bo , who verbally acknowledged these results.   Electronically Signed   By: Marijo Conception, M.D.   On: 01/22/2015 18:07   Dg Chest Port 1 View  01/22/2015   CLINICAL DATA:  Shortness of breath.  EXAM: PORTABLE CHEST 1 VIEW  COMPARISON:  03/26/2011.  FINDINGS: Mildly enlarged cardiac silhouette with a mild increase in size. Stable post CABG changes. Increased prominence of the pulmonary vasculature and interstitial markings. Interval small bilateral pleural effusions. Diffuse osteopenia. Old, healed right humeral head and neck fracture.  IMPRESSION: Interval changes of acute congestive heart failure.   Electronically Signed   By: Claudie Revering M.D.   On: 01/22/2015 15:45    Review of Systems  Constitutional: Negative.   HENT: Negative.   Eyes: Negative.   Respiratory: Negative.   Cardiovascular: Positive for palpitations.  Gastrointestinal: Negative.   Genitourinary: Negative.   Musculoskeletal: Positive for falls.  Skin: Negative.   Neurological: Negative.   Endo/Heme/Allergies: Negative.   Psychiatric/Behavioral: Negative.    Blood pressure 123/67, pulse 108, temperature 98.4 F (36.9 C),  temperature source Oral, resp. rate 20, weight 48.081 kg (106 lb), SpO2 95 %. Physical Exam  Constitutional: She is oriented to person, place, and time. She appears well-developed and well-nourished. No distress.  HENT:  Right Ear: External ear normal.  Left Ear: External ear normal.  Nose: Nose normal.  Mouth/Throat: Oropharynx is clear and moist.  Small area of scalp edema in occipital region  Eyes: Conjunctivae and EOM are normal.  Neck: Normal range of motion. Neck supple.  Cardiovascular: Normal rate and regular rhythm.   Respiratory: Effort normal and breath sounds normal.  GI: Soft.  Musculoskeletal: Normal range of motion.  Neurological: She is alert and oriented to person, place, and time. She has normal strength and normal reflexes. She displays no atrophy and normal reflexes. No cranial nerve deficit or sensory deficit. She exhibits normal muscle tone. She displays no seizure activity. Coordination and gait normal. GCS eye subscore is 4. GCS verbal subscore is 5. GCS motor subscore is 6. She displays no Babinski's sign on the right side. She displays no Babinski's sign on the left side.  Normal proprioception. Normal light touch.  No clonus, hoffman's. Toes are downgoing to plantar stimulation.   Skin: Skin is warm and dry.  Psychiatric: She has a normal mood and affect. Her behavior is normal. Judgment and thought content normal.    Assessment/Plan: The ct shows a miniscule abnormality which poses no risk whatsoever at its present size to the patient. She does not need a repeat head ct so long as her exam remains normal as it currently is.  She has a GCS of 15. Would expect her to continue to do well neurologically. Hypoxia is being treated and evaluated by the medical team.   Evanne Matsunaga L 01/22/2015, 10:53 PM

## 2015-01-23 ENCOUNTER — Inpatient Hospital Stay (HOSPITAL_COMMUNITY): Payer: Medicare Other

## 2015-01-23 DIAGNOSIS — I482 Chronic atrial fibrillation: Secondary | ICD-10-CM | POA: Diagnosis not present

## 2015-01-23 DIAGNOSIS — S06320A Contusion and laceration of left cerebrum without loss of consciousness, initial encounter: Secondary | ICD-10-CM | POA: Diagnosis not present

## 2015-01-23 DIAGNOSIS — I509 Heart failure, unspecified: Secondary | ICD-10-CM

## 2015-01-23 DIAGNOSIS — N183 Chronic kidney disease, stage 3 unspecified: Secondary | ICD-10-CM | POA: Diagnosis present

## 2015-01-23 DIAGNOSIS — D696 Thrombocytopenia, unspecified: Secondary | ICD-10-CM | POA: Diagnosis present

## 2015-01-23 DIAGNOSIS — S06330A Contusion and laceration of cerebrum, unspecified, without loss of consciousness, initial encounter: Secondary | ICD-10-CM | POA: Diagnosis not present

## 2015-01-23 DIAGNOSIS — W19XXXD Unspecified fall, subsequent encounter: Secondary | ICD-10-CM

## 2015-01-23 LAB — PROTIME-INR
INR: 1.3 (ref 0.00–1.49)
INR: 1.3 (ref 0.00–1.49)
Prothrombin Time: 16.3 seconds — ABNORMAL HIGH (ref 11.6–15.2)
Prothrombin Time: 16.3 seconds — ABNORMAL HIGH (ref 11.6–15.2)

## 2015-01-23 LAB — TROPONIN I
Troponin I: 0.03 ng/mL (ref <0.031)
Troponin I: <0.03 ng/mL (ref <0.031)

## 2015-01-23 LAB — COMPREHENSIVE METABOLIC PANEL
ALBUMIN: 2.8 g/dL — AB (ref 3.5–5.0)
ALT: 25 U/L (ref 14–54)
AST: 37 U/L (ref 15–41)
Alkaline Phosphatase: 91 U/L (ref 38–126)
Anion gap: 5 (ref 5–15)
BILIRUBIN TOTAL: 0.8 mg/dL (ref 0.3–1.2)
BUN: 9 mg/dL (ref 6–20)
CO2: 33 mmol/L — ABNORMAL HIGH (ref 22–32)
Calcium: 8.3 mg/dL — ABNORMAL LOW (ref 8.9–10.3)
Chloride: 94 mmol/L — ABNORMAL LOW (ref 101–111)
Creatinine, Ser: 1.02 mg/dL — ABNORMAL HIGH (ref 0.44–1.00)
GFR calc Af Amer: 54 mL/min — ABNORMAL LOW (ref >60)
GFR calc non Af Amer: 47 mL/min — ABNORMAL LOW (ref >60)
GLUCOSE: 116 mg/dL — AB (ref 65–99)
POTASSIUM: 4.3 mmol/L (ref 3.5–5.1)
Sodium: 132 mmol/L — ABNORMAL LOW (ref 135–145)
TOTAL PROTEIN: 5.5 g/dL — AB (ref 6.5–8.1)

## 2015-01-23 LAB — TSH: TSH: 2.025 u[IU]/mL (ref 0.350–4.500)

## 2015-01-23 LAB — CBC
HEMATOCRIT: 39.3 % (ref 36.0–46.0)
HEMOGLOBIN: 12.6 g/dL (ref 12.0–15.0)
MCH: 30.1 pg (ref 26.0–34.0)
MCHC: 32.1 g/dL (ref 30.0–36.0)
MCV: 93.8 fL (ref 78.0–100.0)
Platelets: 105 10*3/uL — ABNORMAL LOW (ref 150–400)
RBC: 4.19 MIL/uL (ref 3.87–5.11)
RDW: 14.2 % (ref 11.5–15.5)
WBC: 8.6 10*3/uL (ref 4.0–10.5)

## 2015-01-23 MED ORDER — MIRTAZAPINE 7.5 MG PO TABS
15.0000 mg | ORAL_TABLET | Freq: Every day | ORAL | Status: DC
  Administered 2015-01-23 – 2015-01-24 (×2): 15 mg via ORAL
  Filled 2015-01-23 (×2): qty 2

## 2015-01-23 MED ORDER — CETYLPYRIDINIUM CHLORIDE 0.05 % MT LIQD
7.0000 mL | Freq: Two times a day (BID) | OROMUCOSAL | Status: DC
  Administered 2015-01-23 – 2015-01-25 (×5): 7 mL via OROMUCOSAL

## 2015-01-23 MED ORDER — ENSURE ENLIVE PO LIQD
237.0000 mL | Freq: Three times a day (TID) | ORAL | Status: DC
  Administered 2015-01-23 – 2015-01-25 (×6): 237 mL via ORAL

## 2015-01-23 NOTE — Plan of Care (Signed)
Problem: Phase II Progression Outcomes Goal: Obtain order to discontinue catheter if appropriate Outcome: Completed/Met Date Met:  01/23/15 Patient does not have a foley catheter.  Problem: Phase III Progression Outcomes Goal: Foley discontinued Outcome: Completed/Met Date Met:  01/23/15 Patient does not have foley catheter.

## 2015-01-23 NOTE — Progress Notes (Signed)
K. Schorr on call with [facility] on unit at this time.  RN spoke to K. Schorr pertaining to patient's drowsiness.  Patient is arousable but does fall back asleep quickly after RN is done asking patient questions. Patient did follow commands with encouragement.  Patient did receive Remeron and Seroquel earlier per md orders.  No new orders from K. Schorr, will continue to monitor patient.

## 2015-01-23 NOTE — Progress Notes (Signed)
  Echocardiogram 2D Echocardiogram has been performed.  Arvil Chaco 01/23/2015, 4:56 PM

## 2015-01-23 NOTE — Progress Notes (Signed)
UR Completed Amarri Satterly Graves-Bigelow, RN,BSN [phone]  

## 2015-01-23 NOTE — Progress Notes (Signed)
Initial Nutrition Assessment  DOCUMENTATION CODES:   Severe malnutrition in context of chronic illness, Underweight  INTERVENTION:    Chocolate Ensure Enlive PO TID, each supplement provides 350 kcal and 20 grams of protein.  NUTRITION DIAGNOSIS:   Malnutrition related to chronic illness as evidenced by severe depletion of body fat, severe depletion of muscle mass.  GOAL:   Patient will meet greater than or equal to 90% of their needs  MONITOR:   PO intake, Supplement acceptance, Labs, Weight trends  REASON FOR ASSESSMENT:   Malnutrition Screening Tool    ASSESSMENT:   81 y.o. female with history of atrial fibrillation anxiety HLD HTN Hypothyroidism GERD presents after a fall. Patient had a CXR done in the ED and this shows presence of CHF.  Patient reports that she does not eat a lot, but she does drink Boost supplements twice per day at home. Nutrition-Focused physical exam completed. Findings are severe fat depletion, severe muscle depletion, and no edema.   Labs reviewed: sodium low.  Diet Order:  Diet heart healthy/carb modified Room service appropriate?: Yes; Fluid consistency:: Thin  Skin:  Reviewed, no issues  Last BM:  unknown  Height:   Ht Readings from Last 1 Encounters:  01/22/15  (1.651 m)    Weight:   Wt Readings from Last 1 Encounters:  01/22/15 106 lb (48.081 kg)    Ideal Body Weight:  56.8 kg  BMI:  Body mass index is 17.64 kg/(m^2).  Estimated Nutritional Needs:   Kcal:  1450-1650  Protein:  70-85 gm  Fluid:  >/= 1.5 L  EDUCATION NEEDS:   No education needs identified at this time  Joaquin Courts, RD, LDN, CNSC Pager [phone] After Hours Pager [phone]

## 2015-01-23 NOTE — Progress Notes (Addendum)
PROGRESS NOTE  Jasmine [name] ZOX:[id number] DOB: 1923-04-18 DOA: 01/22/2015 PCP: Nicki Reaper, NP  HPI: Jasmine [name] is a 81 y.o. female with history of atrial fibrillation anxiety HLD HTN Hypothyroidism GERD presents after a fall. Apparently the patient took a fall while she was trying to throw away some trash. Patient hit the back of her head. Patient did not lose consciousness. She did not have any chest pain. She did not have any seizure. She did not have any loss of bladder control. EMS was called and on arrival she was noted to be hypoxic with saturations in the 80s. Patient was recently seen by her PCP and has been on doxycyline for a bronchitis. Patient had a CXR done in the ED and this shows presence of CHF. She had an Echo done last year which had shown an EF in the 55-60% range.  Subjective / 24 H Interval events - feels well, denies syncope at home, remembers her fall - no nausea/vomiting, no HA  Assessment/Plan: Active Problems:   Hypothyroidism   HLD (hyperlipidemia)   HTN (hypertension)   Chronic atrial fibrillation (HCC)   Fall   Cerebral contusion (HCC)   Systolic heart failure (HCC)   CHF (congestive heart failure) (HCC)   CKD (chronic kidney disease), stage III   Thrombocytopenia (HCC)    Fall with Cerebral Contusion - neurosurgery evaluated patient, no need for further imaging as long as she remains stable clinically   CHF - received IV Lasix last night and again this morning, mild Cr bump, she is on room air, will hold further lasix for now - 2D echo pending - cardiac enzymes negative  CKD stage III - Cr at baseline, hold Lasix for now as per above  URI / bronchitis - diagnosed as an outpatient continue Doxycycline  Atrial Fibrillation - rate controlled - not on anticoagulation, high fall risk  HTN - will continue with antihypertensives - monitor pressures  HLD - will continue with statins  Hypothyroidism - will continue with  synthroid - will check TSH  Thrombocytopenia - possible consumptive due to hematoma, however she has a history of thrombocytopenia and is not new going as far back as 2008. No bleeding, stable.   Diet: Diet heart healthy/carb modified Room service appropriate?: Yes; Fluid consistency:: Thin Fluids: none  DVT Prophylaxis: SCD  Code Status: Full Code Family Communication: d/w daughter bedside  Disposition Plan: home when ready, likely 1 day  Consultants:  None   Procedures:  None    Antibiotics  Anti-infectives    Start     Dose/Rate Route Frequency Ordered Stop   01/22/15 2330  doxycycline (VIBRA-TABS) tablet 100 mg     100 mg Oral Every 12 hours 01/22/15 2307         Studies  Ct Head Wo Contrast  01/22/2015   CLINICAL DATA:  Scalp hematoma after falling. No reported loss of consciousness.  EXAM: CT HEAD WITHOUT CONTRAST  CT CERVICAL SPINE WITHOUT CONTRAST  TECHNIQUE: Multidetector CT imaging of the head and cervical spine was performed following the standard protocol without intravenous contrast. Multiplanar CT image reconstructions of the cervical spine were also generated.  COMPARISON:  CT scan of February 25, 2014.  FINDINGS: CT HEAD FINDINGS  Bony calvarium appears intact. Mild diffuse cortical atrophy is noted. Mild chronic ischemic white matter disease is noted. Large left occipital scalp hematoma is noted. No mass effect or midline shift is noted. Ventricular size is within normal limits. There is no  evidence of mass lesion or acute infarction. 4 mm hyperechoic focus is noted in the right parafalcine area of right parietal cortex concerning for small contusion.  CT CERVICAL SPINE FINDINGS  No fracture or spondylolisthesis is noted. Mild degenerative disc disease is noted at C4-5, C5-6 and C6-7 with anterior osteophyte formation. Visualized lung apices appear normal.  IMPRESSION: Multilevel degenerative disc disease is noted in the cervical spine. No fracture or significant  spondylolisthesis is noted.  Mild diffuse cortical atrophy. Mild chronic ischemic white matter disease. Large left occipital scalp hematoma. Small hyperdense focus noted in right parafalcine area of right parietal cortex concerning for small contusion. Critical Value/emergent results were called by telephone at the time of interpretation on 01/22/2015 at 6:07 pm to Dr. Mancel Bale , who verbally acknowledged these results.   Electronically Signed   By: Lupita Raider, M.D.   On: 01/22/2015 18:07   Ct Cervical Spine Wo Contrast  01/22/2015   CLINICAL DATA:  Scalp hematoma after falling. No reported loss of consciousness.  EXAM: CT HEAD WITHOUT CONTRAST  CT CERVICAL SPINE WITHOUT CONTRAST  TECHNIQUE: Multidetector CT imaging of the head and cervical spine was performed following the standard protocol without intravenous contrast. Multiplanar CT image reconstructions of the cervical spine were also generated.  COMPARISON:  CT scan of February 25, 2014.  FINDINGS: CT HEAD FINDINGS  Bony calvarium appears intact. Mild diffuse cortical atrophy is noted. Mild chronic ischemic white matter disease is noted. Large left occipital scalp hematoma is noted. No mass effect or midline shift is noted. Ventricular size is within normal limits. There is no evidence of mass lesion or acute infarction. 4 mm hyperechoic focus is noted in the right parafalcine area of right parietal cortex concerning for small contusion.  CT CERVICAL SPINE FINDINGS  No fracture or spondylolisthesis is noted. Mild degenerative disc disease is noted at C4-5, C5-6 and C6-7 with anterior osteophyte formation. Visualized lung apices appear normal.  IMPRESSION: Multilevel degenerative disc disease is noted in the cervical spine. No fracture or significant spondylolisthesis is noted.  Mild diffuse cortical atrophy. Mild chronic ischemic white matter disease. Large left occipital scalp hematoma. Small hyperdense focus noted in right parafalcine area of right  parietal cortex concerning for small contusion. Critical Value/emergent results were called by telephone at the time of interpretation on 01/22/2015 at 6:07 pm to Dr. Mancel Bale , who verbally acknowledged these results.   Electronically Signed   By: Lupita Raider, M.D.   On: 01/22/2015 18:07   Dg Chest Port 1 View  01/22/2015   CLINICAL DATA:  Shortness of breath.  EXAM: PORTABLE CHEST 1 VIEW  COMPARISON:  03/26/2011.  FINDINGS: Mildly enlarged cardiac silhouette with a mild increase in size. Stable post CABG changes. Increased prominence of the pulmonary vasculature and interstitial markings. Interval small bilateral pleural effusions. Diffuse osteopenia. Old, healed right humeral head and neck fracture.  IMPRESSION: Interval changes of acute congestive heart failure.   Electronically Signed   By: Beckie Salts M.D.   On: 01/22/2015 15:45    Objective  Filed Vitals:   01/22/15 2300 01/23/15 0002 01/23/15 0444 01/23/15 1230  BP:  106/62 111/56 121/57  Pulse:  108 103 88  Temp:  98.2 F (36.8 C) 97.8 F (36.6 C) 98.3 F (36.8 C)  TempSrc:  Oral Oral Oral  Resp:  16 18 18   Height: 5\' 5"  (1.651 m)     Weight:      SpO2:  97% 98% 96%  Intake/Output Summary (Last 24 hours) at 01/23/15 1502 Last data filed at 01/23/15 1035  Gross per 24 hour  Intake      0 ml  Output    725 ml  Net   -725 ml   Filed Weights   01/22/15 1947 01/22/15 2232  Weight: 47.713 kg (105 lb 3 oz) 48.081 kg (106 lb)   Exam:  GENERAL: NAD  HEENT: head with scalp swelling posteriorly, no scleral icterus. Pupils round and reactive. Mucous membranes are moist. Posterior pharynx clear of any exudate or lesions.  NECK: Supple.   LUNGS: Clear to auscultation. No wheezing  HEART: irregular, without murmur. 2+ pulses, no JVD, no peripheral edema  ABDOMEN: Soft, nontender, and nondistended. Positive bowel sounds.  EXTREMITIES: Without any cyanosis, clubbing, rash, lesions  NEUROLOGIC: Alert and oriented  x3. Cranial nerves II through XII are grossly intact. Strength 5/5 in all 4.   Data Reviewed: Basic Metabolic Panel:  Recent Labs Lab 01/22/15 1816 01/23/15 0501  NA 132* 132*  K 4.5 4.3  CL 91* 94*  CO2 30 33*  GLUCOSE 103* 116*  BUN 8 9  CREATININE 0.97 1.02*  CALCIUM 8.9 8.3*   Liver Function Tests:  Recent Labs Lab 01/23/15 0501  AST 37  ALT 25  ALKPHOS 91  BILITOT 0.8  PROT 5.5*  ALBUMIN 2.8*   CBC:  Recent Labs Lab 01/22/15 1816 01/23/15 0501  WBC 12.8* 8.6  NEUTROABS 11.3*  --   HGB 14.8 12.6  HCT 45.6 39.3  MCV 93.3 93.8  PLT 158 105*   Cardiac Enzymes:  Recent Labs Lab 01/23/15 0001 01/23/15 0501 01/23/15 1048  TROPONINI 0.03 <0.03 <0.03   BNP (last 3 results)  Recent Labs  01/22/15 1817  BNP 417.6*   Recent Results (from the past 240 hour(s))  Urine culture     Status: None (Preliminary result)   Collection Time: 01/22/15  5:21 PM  Result Value Ref Range Status   Specimen Description URINE, CLEAN CATCH  Final   Special Requests NONE  Final   Culture CULTURE REINCUBATED FOR BETTER GROWTH  Final   Report Status PENDING  Incomplete     Scheduled Meds: . antiseptic oral rinse  7 mL Mouth Rinse BID  . aspirin EC  81 mg Oral Daily  . atorvastatin  80 mg Oral q1800  . bisoprolol  5 mg Oral Daily  . calcium-vitamin D  1 tablet Oral Q breakfast  . diltiazem  120 mg Oral Daily  . doxycycline  100 mg Oral Q12H  . feeding supplement (ENSURE ENLIVE)  237 mL Oral BID BM  . levothyroxine  88 mcg Oral QAC breakfast  . lubiprostone  24 mcg Oral Q breakfast  . mirtazapine  15 mg Oral QHS  . multivitamin with minerals  1 tablet Oral Daily  . pantoprazole  40 mg Oral Daily  . QUEtiapine  25 mg Oral QHS  . sodium chloride  3 mL Intravenous Q12H  . sodium chloride  3 mL Intravenous Q12H  . cyanocobalamin  500 mcg Oral Daily  . vitamin E  400 Units Oral Daily   Continuous Infusions:   Pamella Pert, MD [facility] Pager  [phone]. If 7 PM - 7 AM, please contact night-coverage at [email or web address], password [facility] 01/23/2015, 3:02 PM  LOS: 1 day

## 2015-01-24 ENCOUNTER — Observation Stay (HOSPITAL_COMMUNITY): Payer: Medicare Other

## 2015-01-24 DIAGNOSIS — R0902 Hypoxemia: Secondary | ICD-10-CM | POA: Diagnosis not present

## 2015-01-24 DIAGNOSIS — N183 Chronic kidney disease, stage 3 (moderate): Secondary | ICD-10-CM | POA: Diagnosis not present

## 2015-01-24 DIAGNOSIS — D696 Thrombocytopenia, unspecified: Secondary | ICD-10-CM

## 2015-01-24 DIAGNOSIS — J90 Pleural effusion, not elsewhere classified: Secondary | ICD-10-CM | POA: Diagnosis not present

## 2015-01-24 DIAGNOSIS — I5033 Acute on chronic diastolic (congestive) heart failure: Secondary | ICD-10-CM

## 2015-01-24 DIAGNOSIS — S06330A Contusion and laceration of cerebrum, unspecified, without loss of consciousness, initial encounter: Secondary | ICD-10-CM | POA: Diagnosis not present

## 2015-01-24 DIAGNOSIS — J9691 Respiratory failure, unspecified with hypoxia: Secondary | ICD-10-CM | POA: Diagnosis present

## 2015-01-24 DIAGNOSIS — J9601 Acute respiratory failure with hypoxia: Secondary | ICD-10-CM

## 2015-01-24 DIAGNOSIS — S06320A Contusion and laceration of left cerebrum without loss of consciousness, initial encounter: Secondary | ICD-10-CM | POA: Diagnosis not present

## 2015-01-24 DIAGNOSIS — I482 Chronic atrial fibrillation: Secondary | ICD-10-CM | POA: Diagnosis not present

## 2015-01-24 LAB — CBC
HCT: 36.6 % (ref 36.0–46.0)
HEMOGLOBIN: 11.9 g/dL — AB (ref 12.0–15.0)
MCH: 30.4 pg (ref 26.0–34.0)
MCHC: 32.5 g/dL (ref 30.0–36.0)
MCV: 93.6 fL (ref 78.0–100.0)
Platelets: 111 10*3/uL — ABNORMAL LOW (ref 150–400)
RBC: 3.91 MIL/uL (ref 3.87–5.11)
RDW: 14.1 % (ref 11.5–15.5)
WBC: 7.4 10*3/uL (ref 4.0–10.5)

## 2015-01-24 LAB — BASIC METABOLIC PANEL
ANION GAP: 3 — AB (ref 5–15)
BUN: 18 mg/dL (ref 6–20)
CALCIUM: 8.2 mg/dL — AB (ref 8.9–10.3)
CHLORIDE: 88 mmol/L — AB (ref 101–111)
CO2: 38 mmol/L — AB (ref 22–32)
CREATININE: 1.1 mg/dL — AB (ref 0.44–1.00)
GFR calc Af Amer: 50 mL/min — ABNORMAL LOW (ref >60)
GFR calc non Af Amer: 43 mL/min — ABNORMAL LOW (ref >60)
GLUCOSE: 115 mg/dL — AB (ref 65–99)
Potassium: 4 mmol/L (ref 3.5–5.1)
Sodium: 129 mmol/L — ABNORMAL LOW (ref 135–145)

## 2015-01-24 LAB — URINE CULTURE

## 2015-01-24 LAB — HEMOGLOBIN A1C
HEMOGLOBIN A1C: 6 % — AB (ref 4.8–5.6)
Mean Plasma Glucose: 126 mg/dL

## 2015-01-24 LAB — GLUCOSE, CAPILLARY: Glucose-Capillary: 107 mg/dL — ABNORMAL HIGH (ref 65–99)

## 2015-01-24 MED ORDER — CYCLOBENZAPRINE HCL 10 MG PO TABS
10.0000 mg | ORAL_TABLET | Freq: Once | ORAL | Status: AC
  Administered 2015-01-24: 10 mg via ORAL
  Filled 2015-01-24: qty 1

## 2015-01-24 MED ORDER — CLONAZEPAM 1 MG PO TABS
1.0000 mg | ORAL_TABLET | Freq: Every day | ORAL | Status: DC
  Administered 2015-01-24: 1 mg via ORAL
  Filled 2015-01-24: qty 1

## 2015-01-24 MED ORDER — CLONAZEPAM 0.5 MG PO TABS: 0.5000 mg | ORAL_TABLET | Freq: Two times a day (BID) | ORAL | Status: DC

## 2015-01-24 MED ORDER — HYDROCODONE-ACETAMINOPHEN 5-325 MG PO TABS: 1.0000 | ORAL_TABLET | Freq: Once | ORAL | Status: DC

## 2015-01-24 MED ORDER — FUROSEMIDE 10 MG/ML IJ SOLN
20.0000 mg | Freq: Once | INTRAMUSCULAR | Status: AC
  Administered 2015-01-24: 20 mg via INTRAVENOUS
  Filled 2015-01-24: qty 2

## 2015-01-24 MED ORDER — CLONAZEPAM 0.5 MG PO TABS
0.5000 mg | ORAL_TABLET | Freq: Every day | ORAL | Status: DC
  Administered 2015-01-24 – 2015-01-25 (×2): 0.5 mg via ORAL
  Filled 2015-01-24: qty 1

## 2015-01-24 MED ORDER — CLONAZEPAM 0.5 MG PO TABS
0.5000 mg | ORAL_TABLET | Freq: Two times a day (BID) | ORAL | Status: DC | PRN
  Filled 2015-01-24: qty 1

## 2015-01-24 NOTE — Care Management Note (Addendum)
Case Management Note  Patient Details  Name: Jasmine [name] MRN: [id number] Date of Birth: 23-Dec-1922  Subjective/Objective: Pt admitted due to fall and Hypoxia. Initiated on 02 and IV lasix for diuresis. Pt is from home and her daughter lives with her. Per pt she has DME: [facility], [employer] and [employer]. PT to consult for recommendations.                    Action/Plan: CM will continue to monitor for disposition needs.   Expected Discharge Date:                  Expected Discharge Plan:  Home w Home Health Services  In-House Referral:  NA  Discharge planning Services  CM Consult  Post Acute Care Choice:    Choice offered to:     DME Arranged:  N/A Oxygen DME Agency:   [facility]  HH Arranged:   Physical Therapy HH Agency:   [facility]  Status of Service:  In process, will continue to follow  Medicare Important Message Given:    Date Medicare IM Given:    Medicare IM give by:    Date Additional Medicare IM Given:    Additional Medicare Important Message give by:     If discussed at Long Length of Stay Meetings, dates discussed:    Additional Comments: 1429 01-25-15 Tomi Bamberger, RN,BSN [phone] CM did speak with pt in regards to disposition needs. Plan will be for home with support of daughter. CM did make referral with [facility] for PT services and DME 02. O2 to be delivered to room before d/c. CM did provide family with personal care provider list. No further needs from CM at this time.   Gala Lewandowsky, RN 01/24/2015, 11:46 AM

## 2015-01-24 NOTE — Progress Notes (Signed)
PROGRESS NOTE  Jasmine [name] ZOX:[id number] DOB: 25-Dec-1922 DOA: 01/22/2015 PCP: Nicki Reaper, NP  HPI: Jasmine [name] is a 81 y.o. female with history of atrial fibrillation anxiety HLD HTN Hypothyroidism GERD presents after a fall. Apparently the patient took a fall while she was trying to throw away some trash. Patient hit the back of her head. Patient did not lose consciousness. She did not have any chest pain. She did not have any seizure. She did not have any loss of bladder control. EMS was called and on arrival she was noted to be hypoxic with saturations in the 80s. Patient was recently seen by her PCP and has been on doxycyline for a bronchitis. Patient had a CXR done in the ED and this shows presence of CHF. She had an Echo done last year which had shown an EF in the 55-60% range.  Subjective / 24 H Interval events - no complaints, still on oxygen this morning  Assessment/Plan: Active Problems:   Hypothyroidism   HLD (hyperlipidemia)   HTN (hypertension)   Chronic atrial fibrillation (HCC)   Fall   Cerebral contusion (HCC)   Systolic heart failure (HCC)   CHF (congestive heart failure) (HCC)   CKD (chronic kidney disease), stage III   Thrombocytopenia (HCC)    Fall with Cerebral Contusion - neurosurgery evaluated patient, no need for further imaging as long as she remains stable clinically   CHF - received IV Lasix last night and again this morning, mild Cr bump, she is on room air, will hold further lasix for now - 2D echo with normal systolic function EF 55-60%, she has pulmonary hypertension, unchanged from last year - cardiac enzymes negative - given persistent hypoxia repeat CXR, more Lasix based on findings  Acute hypoxic respiratory failure - repeat CXR today, consider more Lasix based on findings - completely asymptomatic, suspect ongoing at home - may need oxygen on d/c   CKD stage III - Cr at baseline  URI / bronchitis - diagnosed as an outpatient  continue Doxycycline  Atrial Fibrillation - rate controlled - not on anticoagulation, high fall risk  HTN - will continue with antihypertensives - monitor pressures  HLD - will continue with statins  Hypothyroidism - will continue with synthroid - will check TSH  Thrombocytopenia - possible consumptive due to hematoma, however she has a history of thrombocytopenia and is not new going as far back as 2008. No bleeding, stable. - improving   Diet: Diet heart healthy/carb modified Room service appropriate?: Yes; Fluid consistency:: Thin Fluids: none  DVT Prophylaxis: SCD  Code Status: Full Code Family Communication: d/w daughter bedside  Disposition Plan: home when ready, likely 1 day  Consultants:  None   Procedures:  None    Antibiotics  Anti-infectives    Start     Dose/Rate Route Frequency Ordered Stop   01/22/15 2330  doxycycline (VIBRA-TABS) tablet 100 mg     100 mg Oral Every 12 hours 01/22/15 2307        Studies  Ct Head Wo Contrast  01/22/2015   CLINICAL DATA:  Scalp hematoma after falling. No reported loss of consciousness.  EXAM: CT HEAD WITHOUT CONTRAST  CT CERVICAL SPINE WITHOUT CONTRAST  TECHNIQUE: Multidetector CT imaging of the head and cervical spine was performed following the standard protocol without intravenous contrast. Multiplanar CT image reconstructions of the cervical spine were also generated.  COMPARISON:  CT scan of February 25, 2014.  FINDINGS: CT HEAD FINDINGS  Bony calvarium  appears intact. Mild diffuse cortical atrophy is noted. Mild chronic ischemic white matter disease is noted. Large left occipital scalp hematoma is noted. No mass effect or midline shift is noted. Ventricular size is within normal limits. There is no evidence of mass lesion or acute infarction. 4 mm hyperechoic focus is noted in the right parafalcine area of right parietal cortex concerning for small contusion.  CT CERVICAL SPINE FINDINGS  No fracture or  spondylolisthesis is noted. Mild degenerative disc disease is noted at C4-5, C5-6 and C6-7 with anterior osteophyte formation. Visualized lung apices appear normal.  IMPRESSION: Multilevel degenerative disc disease is noted in the cervical spine. No fracture or significant spondylolisthesis is noted.  Mild diffuse cortical atrophy. Mild chronic ischemic white matter disease. Large left occipital scalp hematoma. Small hyperdense focus noted in right parafalcine area of right parietal cortex concerning for small contusion. Critical Value/emergent results were called by telephone at the time of interpretation on 01/22/2015 at 6:07 pm to Dr. Mancel Bale , who verbally acknowledged these results.   Electronically Signed   By: Lupita Raider, M.D.   On: 01/22/2015 18:07   Ct Cervical Spine Wo Contrast  01/22/2015   CLINICAL DATA:  Scalp hematoma after falling. No reported loss of consciousness.  EXAM: CT HEAD WITHOUT CONTRAST  CT CERVICAL SPINE WITHOUT CONTRAST  TECHNIQUE: Multidetector CT imaging of the head and cervical spine was performed following the standard protocol without intravenous contrast. Multiplanar CT image reconstructions of the cervical spine were also generated.  COMPARISON:  CT scan of February 25, 2014.  FINDINGS: CT HEAD FINDINGS  Bony calvarium appears intact. Mild diffuse cortical atrophy is noted. Mild chronic ischemic white matter disease is noted. Large left occipital scalp hematoma is noted. No mass effect or midline shift is noted. Ventricular size is within normal limits. There is no evidence of mass lesion or acute infarction. 4 mm hyperechoic focus is noted in the right parafalcine area of right parietal cortex concerning for small contusion.  CT CERVICAL SPINE FINDINGS  No fracture or spondylolisthesis is noted. Mild degenerative disc disease is noted at C4-5, C5-6 and C6-7 with anterior osteophyte formation. Visualized lung apices appear normal.  IMPRESSION: Multilevel degenerative  disc disease is noted in the cervical spine. No fracture or significant spondylolisthesis is noted.  Mild diffuse cortical atrophy. Mild chronic ischemic white matter disease. Large left occipital scalp hematoma. Small hyperdense focus noted in right parafalcine area of right parietal cortex concerning for small contusion. Critical Value/emergent results were called by telephone at the time of interpretation on 01/22/2015 at 6:07 pm to Dr. Mancel Bale , who verbally acknowledged these results.   Electronically Signed   By: Lupita Raider, M.D.   On: 01/22/2015 18:07   Dg Chest Port 1 View  01/22/2015   CLINICAL DATA:  Shortness of breath.  EXAM: PORTABLE CHEST 1 VIEW  COMPARISON:  03/26/2011.  FINDINGS: Mildly enlarged cardiac silhouette with a mild increase in size. Stable post CABG changes. Increased prominence of the pulmonary vasculature and interstitial markings. Interval small bilateral pleural effusions. Diffuse osteopenia. Old, healed right humeral head and neck fracture.  IMPRESSION: Interval changes of acute congestive heart failure.   Electronically Signed   By: Beckie Salts M.D.   On: 01/22/2015 15:45   Objective  Filed Vitals:   01/23/15 2351 01/24/15 0449 01/24/15 1159 01/24/15 1240  BP: 110/64 109/49    Pulse: 101 86    Temp: 98.6 F (37 C) 98.3 F (36.8  C)    TempSrc: Oral Oral    Resp: 18 18    Height:      Weight:  47.083 kg (103 lb 12.8 oz)    SpO2: 98% 97% 95% 84%    Intake/Output Summary (Last 24 hours) at 01/24/15 1412 Last data filed at 01/24/15 1100  Gross per 24 hour  Intake    340 ml  Output    125 ml  Net    215 ml   Filed Weights   01/22/15 1947 01/22/15 2232 01/24/15 0449  Weight: 47.713 kg (105 lb 3 oz) 48.081 kg (106 lb) 47.083 kg (103 lb 12.8 oz)   Exam:  GENERAL: NAD  HEENT: head with scalp swelling posteriorly, no scleral icterus. Pupils round and reactive. Mucous membranes are moist. Posterior pharynx clear of any exudate or lesions.  NECK:  Supple.   LUNGS: Clear to auscultation. No wheezing  HEART: irregular, without murmur. 2+ pulses, no JVD, no peripheral edema  ABDOMEN: Soft, nontender, and nondistended. Positive bowel sounds.  EXTREMITIES: Without any cyanosis, clubbing, rash, lesions  NEUROLOGIC: non focal    Data Reviewed: Basic Metabolic Panel:  Recent Labs Lab 01/22/15 1816 01/23/15 0501 01/24/15 1308  NA 132* 132* PENDING  K 4.5 4.3 4.0  CL 91* 94* PENDING  CO2 30 33* PENDING  GLUCOSE 103* 116* 115*  BUN CREATININE 0.97 1.02* 1.10*  CALCIUM 8.9 8.3* 8.2*   Liver Function Tests:  Recent Labs Lab 01/23/15 0501  AST 37  ALT 25  ALKPHOS 91  BILITOT 0.8  PROT 5.5*  ALBUMIN 2.8*   CBC:  Recent Labs Lab 01/22/15 1816 01/23/15 0501 01/24/15 1308  WBC 12.8* 8.6 7.4  NEUTROABS 11.3*  --   --   HGB 14.8 12.6 11.9*  HCT 45.6 39.3 36.6  MCV 93.3 93.8 93.6  PLT 158 105* 111*   Cardiac Enzymes:  Recent Labs Lab 01/23/15 0001 01/23/15 0501 01/23/15 1048  TROPONINI 0.03 <0.03 <0.03   BNP (last 3 results)  Recent Labs  01/22/15 1817  BNP 417.6*   Recent Results (from the past 240 hour(s))  Urine culture     Status: None   Collection Time: 01/22/15  5:21 PM  Result Value Ref Range Status   Specimen Description URINE, CLEAN CATCH  Final   Special Requests NONE  Final   Culture MULTIPLE SPECIES PRESENT, SUGGEST RECOLLECTION  Final   Report Status 01/24/2015 FINAL  Final     Scheduled Meds: . antiseptic oral rinse  7 mL Mouth Rinse BID  . aspirin EC  81 mg Oral Daily  . atorvastatin  80 mg Oral q1800  . bisoprolol  5 mg Oral Daily  . calcium-vitamin D  1 tablet Oral Q breakfast  . diltiazem  120 mg Oral Daily  . doxycycline  100 mg Oral Q12H  . feeding supplement (ENSURE ENLIVE)  237 mL Oral TID BM  . HYDROcodone-acetaminophen  1 tablet Oral Once  . levothyroxine  88 mcg Oral QAC breakfast  . lubiprostone  24 mcg Oral Q breakfast  . mirtazapine  15 mg Oral QHS  .  multivitamin with minerals  1 tablet Oral Daily  . pantoprazole  40 mg Oral Daily  . QUEtiapine  25 mg Oral QHS  . sodium chloride  3 mL Intravenous Q12H  . sodium chloride  3 mL Intravenous Q12H  . cyanocobalamin  500 mcg Oral Daily  . vitamin E  400 Units Oral Daily   Continuous  Infusions:   Pamella Pert, MD [facility] Pager [phone]. If 7 PM - 7 AM, please contact night-coverage at [email or web address], password [facility] 01/24/2015, 2:12 PM  LOS: 2 days

## 2015-01-24 NOTE — Evaluation (Signed)
Physical Therapy Evaluation Patient Details Name: Jasmine [name] MRN: [id number] DOB: 03-19-1923 Today's Date: 01/24/2015   History of Present Illness  Jasmine [name] is a 81 y.o. female with history of atrial fibrillation anxiety HLD HTN Hypothyroidism GERD presents after a fall during which she hit her head. Dx of CHF, hypoxia, cerebral contusion.  Clinical Impression  Pt admitted with above diagnosis. Pt currently with functional limitations due to the deficits listed below (see PT Problem List). Jasmine [name] walked 64' with a RW, distance was limited by therapist due to drop in SaO2 to 84% on RA. Resting SaO2 on 2L was 94%. She reports having several falls in the past year, HHPT recommended.  Pt will benefit from skilled PT to increase their independence and safety with mobility to allow discharge to the venue listed below.       Follow Up Recommendations Home health PT    Equipment Recommendations  May need Oxygen    Recommendations for Other Services       Precautions / Restrictions Precautions Precautions: Fall Precaution Comments: pt reports "several" falls in past year, though couldn't recall details of the previous falls Restrictions Weight Bearing Restrictions: No      Mobility  Bed Mobility Overal bed mobility: Modified Independent             General bed mobility comments: with rail, HOB up 35*  Transfers Overall transfer level: Modified independent Equipment used: None                Ambulation/Gait Ambulation/Gait assistance: Supervision Ambulation Distance (Feet): 45 Feet Assistive device: Rolling walker (2 wheeled) Gait Pattern/deviations: Step-through pattern;Decreased step length - right;Decreased step length - left   Gait velocity interpretation: at or above normal speed for age/gender General Gait Details: steady with RW, SaO2 dropped to 84% on RA, HR 94, SaO2 did not come up after 2 min rest so applied 2L O2 and encouraged pursed lip  breathing which brought it up to 94%  Stairs            Wheelchair Mobility    Modified Rankin (Stroke Patients Only)       Balance Overall balance assessment: History of Falls                                           Pertinent Vitals/Pain Pain Assessment: No/denies pain    Home Living Family/patient expects to be discharged to:: Private residence Living Arrangements: Alone (Patient's daughter states she visits her mother everyday.) Available Help at Discharge: Family;Available PRN/intermittently   Home Access: Stairs to enter Entrance Stairs-Rails: Right;Left Entrance Stairs-Number of Steps: 3 Home Layout: One level Home Equipment: Bedside commode;Shower seat;Walker - 2 wheels      Prior Function Level of Independence: Independent               Hand Dominance        Extremity/Trunk Assessment   Upper Extremity Assessment: Overall WFL for tasks assessed           Lower Extremity Assessment: Overall WFL for tasks assessed      Cervical / Trunk Assessment: Kyphotic  Communication   Communication: No difficulties  Cognition Arousal/Alertness: Awake/alert Behavior During Therapy: WFL for tasks assessed/performed Overall Cognitive Status: Within Functional Limits for tasks assessed  General Comments      Exercises        Assessment/Plan    PT Assessment Patient needs continued PT services  PT Diagnosis Difficulty walking   PT Problem List Cardiopulmonary status limiting activity;Decreased activity tolerance;Decreased balance;Decreased mobility  PT Treatment Interventions Gait training;Functional mobility training;Therapeutic activities;Patient/family education   PT Goals (Current goals can be found in the Care Plan section) Acute Rehab PT Goals Patient Stated Goal: to go home PT Goal Formulation: With patient Time For Goal Achievement: 02/07/15 Potential to Achieve Goals: Good     Frequency Min 3X/week   Barriers to discharge        Co-evaluation               End of Session Equipment Utilized During Treatment: Gait belt;Oxygen Activity Tolerance: Patient limited by fatigue Patient left: in bed;with call bell/phone within reach      Functional Assessment Tool Used: clinical judgement Functional Limitation: Mobility: Walking and moving around Mobility: Walking and Moving Around Current Status ([id number]): At least 20 percent but less than 40 percent impaired, limited or restricted Mobility: Walking and Moving Around Goal Status [phone]): At least 1 percent but less than 20 percent impaired, limited or restricted    Time: 1201-1221 PT Time Calculation (min) (ACUTE ONLY): 20 min   Charges:   PT Evaluation $Initial PT Evaluation Tier I: 1 Procedure     PT G Codes:   PT G-Codes **NOT FOR INPATIENT CLASS** Functional Assessment Tool Used: clinical judgement Functional Limitation: Mobility: Walking and moving around Mobility: Walking and Moving Around Current Status (U9811): At least 20 percent but less than 40 percent impaired, limited or restricted Mobility: Walking and Moving Around Goal Status [phone]): At least 1 percent but less than 20 percent impaired, limited or restricted    Tamala Ser 01/24/2015, 1:03 PM [phone]

## 2015-01-25 DIAGNOSIS — S06330A Contusion and laceration of cerebrum, unspecified, without loss of consciousness, initial encounter: Secondary | ICD-10-CM | POA: Diagnosis not present

## 2015-01-25 DIAGNOSIS — I482 Chronic atrial fibrillation: Secondary | ICD-10-CM | POA: Diagnosis not present

## 2015-01-25 DIAGNOSIS — I5033 Acute on chronic diastolic (congestive) heart failure: Secondary | ICD-10-CM | POA: Diagnosis not present

## 2015-01-25 DIAGNOSIS — S06320A Contusion and laceration of left cerebrum without loss of consciousness, initial encounter: Secondary | ICD-10-CM | POA: Diagnosis not present

## 2015-01-25 DIAGNOSIS — J9621 Acute and chronic respiratory failure with hypoxia: Secondary | ICD-10-CM

## 2015-01-25 MED ORDER — FUROSEMIDE 20 MG PO TABS: 20.0000 mg | ORAL_TABLET | Freq: Every day | ORAL | Status: DC | PRN

## 2015-01-25 NOTE — Discharge Summary (Signed)
Physician Discharge Summary  Jasmine [name]:[id number] DOB: 08/04/1922 DOA: 01/22/2015  PCP: Nicki Reaper, NP  Admit date: 01/22/2015 Discharge date: 01/25/2015  Time spent: > 30 minutes  Recommendations for Outpatient Follow-up:  1. Follow up with Nicki Reaper in 2 weeks   Discharge Diagnoses:  Active Problems:   Hypothyroidism   HLD (hyperlipidemia)   HTN (hypertension)   Chronic atrial fibrillation (HCC)   Fall   Cerebral contusion (HCC)   Systolic heart failure (HCC)   CHF (congestive heart failure) (HCC)   CKD (chronic kidney disease), stage III   Thrombocytopenia (HCC)   Respiratory failure with hypoxia [facility])  Discharge Condition: stable  Diet recommendation: heart healthy  Filed Weights   01/22/15 2232 01/24/15 0449 01/25/15 0500  Weight: 48.081 kg (106 lb) 47.083 kg (103 lb 12.8 oz) 47.219 kg (104 lb 1.6 oz)   History of present illness:  Jasmine [name] is a 81 y.o. female with history of atrial fibrillation anxiety HLD HTN Hypothyroidism GERD presents after a fall. Apparently the patient took a fall while she was trying to throw away some trash. Patient hit the back of her head. Patient did not lose consciousness. She did not have any chest pain. She did not have any seizure. She did not have any loss of bladder control. EMS was called and on arrival she was noted to be hypoxic with saturations in the 80s. Patient was recently seen by her PCP and has been on doxycyline for a bronchitis. Patient had a CXR done in the ED and this shows presence of CHF. She had an Echo done last year which had shown an EF in the 55-60% range.  Hospital Course:  Fall with Cerebral Contusion - neurosurgery evaluated patient upon admission and recommended no further workup. She clinically has remained stable with intact neurologic exam, and was back to baseline, able to ambulate with PT. HHPT arranged on d/c. Acute on chronic diastolic CHF with pulmonary hypertension-  CXR with fluid  overload, she was mildly dyspneic, has received IV lasix x 3 with improvement in her respiratory status. 2D echo with normal systolic function EF 55-60%, she has pulmonary hypertension, unchanged from last year. Her cardiac enzymes were negative.  Acute on chronic hypoxic respiratory failure - s/p diuresis with Lasix with improvement in her respiratory status. Patient needed oxygen on discharge, 2L [place]. On ambulation on room air, even with O2 sats in mid 80s patient was asymptomatic without discomfort, suggesting a chronic component to her respiratory failure.  CKD stage III - Cr at baseline URI / bronchitis - diagnosed as an outpatient continue Doxycycline Atrial Fibrillation - rate controlled, not on anticoagulation, high fall risk HTN - will continue with antihypertensives HLD - will continue with statins Hypothyroidism - will continue with synthroid, TSH normal at 2.0 Thrombocytopenia - possible consumptive due to hematoma, however she has a history of thrombocytopenia and is not new going as far back as 2008. No bleeding, stable. Improving on discharge.   Procedures:  2D echo   Consultations:  None   Discharge Exam: Filed Vitals:   01/25/15 0500 01/25/15 1200 01/25/15 1201 01/25/15 1338  BP: 103/46   114/64  Pulse: 68   82  Temp: 97.5 F (36.4 C)   98.8 F (37.1 C)  TempSrc: Oral   Oral  Resp: 16   16  Height:      Weight: 47.219 kg (104 lb 1.6 oz)     SpO2: 97% 94% 84% 87%  General: NAD Cardiovascular: RRR Respiratory: CTA biL  Discharge Instructions    Medication List    TAKE these medications        acetaminophen 500 MG tablet  Commonly known as:  TYLENOL  Take 500 mg by mouth every 6 (six) hours as needed for mild pain. For pain     aspirin 81 MG tablet  Take 81 mg by mouth daily.     atorvastatin 80 MG tablet  Commonly known as:  LIPITOR  TAKE 1 TABLET BY MOUTH DAILY     bisoprolol 5 MG tablet  Commonly known as:  ZEBETA  TAKE 1 TABLET DAILY      CALCIUM 600-D 600-400 MG-UNIT tablet  Generic drug:  Calcium Carbonate-Vitamin D  Take 1 tablet by mouth daily.     clonazePAM 1 MG tablet  Commonly known as:  KLONOPIN  TAKE 1/2 TABLET BY MOUTH EVERY MORNING, 1/2 TABLET AT 6PM, AND 1 TABLET AT BEDTIME     cyanocobalamin 500 MCG tablet  Take 500 mcg by mouth daily.     diltiazem 120 MG 24 hr capsule  Commonly known as:  TIAZAC  Take 1 capsule (120 mg total) by mouth daily.     doxycycline 100 MG tablet  Commonly known as:  VIBRA-TABS  Take 1 tablet (100 mg total) by mouth 2 (two) times daily.     furosemide 20 MG tablet  Commonly known as:  LASIX  Take 1 tablet (20 mg total) by mouth daily as needed for fluid or edema.     levothyroxine 88 MCG tablet  Commonly known as:  SYNTHROID, LEVOTHROID  TAKE 1 TABLET DAILY     lubiprostone 24 MCG capsule  Commonly known as:  AMITIZA  Take 1 capsule (24 mcg total) by mouth 2 (two) times daily with a meal.     mirtazapine 45 MG tablet  Commonly known as:  REMERON  TAKE 1 TABLET AT BEDTIME     multivitamin capsule  Take 1 capsule by mouth daily.     omeprazole 20 MG capsule  Commonly known as:  PRILOSEC  Take 1 capsule (20 mg total) by mouth daily.     QUEtiapine 25 MG tablet  Commonly known as:  SEROQUEL  Take 1 tablet (25 mg total) by mouth at bedtime.     traMADol 50 MG tablet  Commonly known as:  ULTRAM  Take 1 tablet (50 mg total) by mouth every 8 (eight) hours as needed.     vitamin E 400 UNIT capsule  Take 400 Units by mouth daily.           Follow-up Information    Follow up with BAITY, REGINA, NP In 1 week.   Specialty:  Internal Medicine   Contact information:   [address] [place] [address] [phone]       Follow up with [facility].   Why:  Physical Therapy   Contact information:   [address] [place] [address] [phone]       Follow up with [facility].   Why:  Oxygen    Contact information:   [address] [place] [address] [phone]      The results of significant diagnostics from this hospitalization (including imaging, microbiology, ancillary and laboratory) are listed below for reference.    Significant Diagnostic Studies: Ct Head Wo Contrast  01/22/2015   CLINICAL DATA:  Scalp hematoma after falling. No reported  loss of consciousness.  EXAM: CT HEAD WITHOUT CONTRAST  CT CERVICAL SPINE WITHOUT CONTRAST  TECHNIQUE: Multidetector CT imaging of the head and cervical spine was performed following the standard protocol without intravenous contrast. Multiplanar CT image reconstructions of the cervical spine were also generated.  COMPARISON:  CT scan of February 25, 2014.  FINDINGS: CT HEAD FINDINGS  Bony calvarium appears intact. Mild diffuse cortical atrophy is noted. Mild chronic ischemic white matter disease is noted. Large left occipital scalp hematoma is noted. No mass effect or midline shift is noted. Ventricular size is within normal limits. There is no evidence of mass lesion or acute infarction. 4 mm hyperechoic focus is noted in the right parafalcine area of right parietal cortex concerning for small contusion.  CT CERVICAL SPINE FINDINGS  No fracture or spondylolisthesis is noted. Mild degenerative disc disease is noted at C4-5, C5-6 and C6-7 with anterior osteophyte formation. Visualized lung apices appear normal.  IMPRESSION: Multilevel degenerative disc disease is noted in the cervical spine. No fracture or significant spondylolisthesis is noted.  Mild diffuse cortical atrophy. Mild chronic ischemic white matter disease. Large left occipital scalp hematoma. Small hyperdense focus noted in right parafalcine area of right parietal cortex concerning for small contusion. Critical Value/emergent results were called by telephone at the time of interpretation on 01/22/2015 at 6:07 pm to Dr. Mancel Bale , who verbally acknowledged these results.    Electronically Signed   By: Lupita Raider, M.D.   On: 01/22/2015 18:07   Ct Cervical Spine Wo Contrast  01/22/2015   CLINICAL DATA:  Scalp hematoma after falling. No reported loss of consciousness.  EXAM: CT HEAD WITHOUT CONTRAST  CT CERVICAL SPINE WITHOUT CONTRAST  TECHNIQUE: Multidetector CT imaging of the head and cervical spine was performed following the standard protocol without intravenous contrast. Multiplanar CT image reconstructions of the cervical spine were also generated.  COMPARISON:  CT scan of February 25, 2014.  FINDINGS: CT HEAD FINDINGS  Bony calvarium appears intact. Mild diffuse cortical atrophy is noted. Mild chronic ischemic white matter disease is noted. Large left occipital scalp hematoma is noted. No mass effect or midline shift is noted. Ventricular size is within normal limits. There is no evidence of mass lesion or acute infarction. 4 mm hyperechoic focus is noted in the right parafalcine area of right parietal cortex concerning for small contusion.  CT CERVICAL SPINE FINDINGS  No fracture or spondylolisthesis is noted. Mild degenerative disc disease is noted at C4-5, C5-6 and C6-7 with anterior osteophyte formation. Visualized lung apices appear normal.  IMPRESSION: Multilevel degenerative disc disease is noted in the cervical spine. No fracture or significant spondylolisthesis is noted.  Mild diffuse cortical atrophy. Mild chronic ischemic white matter disease. Large left occipital scalp hematoma. Small hyperdense focus noted in right parafalcine area of right parietal cortex concerning for small contusion. Critical Value/emergent results were called by telephone at the time of interpretation on 01/22/2015 at 6:07 pm to Dr. Mancel Bale , who verbally acknowledged these results.   Electronically Signed   By: Lupita Raider, M.D.   On: 01/22/2015 18:07   Dg Chest Port 1 View  01/24/2015   CLINICAL DATA:  Hypoxia  EXAM: PORTABLE CHEST 1 VIEW  COMPARISON:  January 22, 2015   FINDINGS: The heart size and mediastinal contours are stable. Patient is status post prior median sternotomy and CABG. The heart size mildly enlarged. There is left pleural effusion with consolidation of left lung base. Mild interstitial edema is  identified bilaterally. The visualized skeletal structures are stable.  IMPRESSION: Mild interstitial edema.  Small left pleural effusion with consolidation left lung base probably due to underlying atelectasis.   Electronically Signed   By: Sherian Rein M.D.   On: 01/24/2015 14:14   Dg Chest Port 1 View  01/22/2015   CLINICAL DATA:  Shortness of breath.  EXAM: PORTABLE CHEST 1 VIEW  COMPARISON:  03/26/2011.  FINDINGS: Mildly enlarged cardiac silhouette with a mild increase in size. Stable post CABG changes. Increased prominence of the pulmonary vasculature and interstitial markings. Interval small bilateral pleural effusions. Diffuse osteopenia. Old, healed right humeral head and neck fracture.  IMPRESSION: Interval changes of acute congestive heart failure.   Electronically Signed   By: Beckie Salts M.D.   On: 01/22/2015 15:45    Microbiology: Recent Results (from the past 240 hour(s))  Urine culture     Status: None   Collection Time: 01/22/15  5:21 PM  Result Value Ref Range Status   Specimen Description URINE, CLEAN CATCH  Final   Special Requests NONE  Final   Culture MULTIPLE SPECIES PRESENT, SUGGEST RECOLLECTION  Final   Report Status 01/24/2015 FINAL  Final    Labs: Basic Metabolic Panel:  Recent Labs Lab 01/22/15 1816 01/23/15 0501 01/24/15 1308  NA 132* 132* 129*  K 4.5 4.3 4.0  CL 91* 94* 88*  CO2 30 33* 38*  GLUCOSE 103* 116* 115*  BUN 8 9 18   CREATININE 0.97 1.02* 1.10*  CALCIUM 8.9 8.3* 8.2*   Liver Function Tests:  Recent Labs Lab 01/23/15 0501  AST 37  ALT 25  ALKPHOS 91  BILITOT 0.8  PROT 5.5*  ALBUMIN 2.8*   CBC:  Recent Labs Lab 01/22/15 1816 01/23/15 0501 01/24/15 1308  WBC 12.8* 8.6 7.4  NEUTROABS  11.3*  --   --   HGB 14.8 12.6 11.9*  HCT 45.6 39.3 36.6  MCV 93.3 93.8 93.6  PLT 158 105* 111*   Cardiac Enzymes:  Recent Labs Lab 01/23/15 0001 01/23/15 0501 01/23/15 1048  TROPONINI 0.03 <0.03 <0.03   BNP: BNP (last 3 results)  Recent Labs  01/22/15 1817  BNP 417.6*   CBG:  Recent Labs Lab 01/24/15 0802  GLUCAP 107*   Signed:  Hilliard Borges  [facility] 01/25/2015, 4:58 PM

## 2015-01-25 NOTE — Progress Notes (Signed)
SATURATION QUALIFICATIONS: (This note is used to comply with regulatory documentation for home oxygen)  Patient Saturations on Room Air at Rest = 94%  Patient Saturations on Room Air while Ambulating = 84%  Patient Saturations on 2 Liters of oxygen while Ambulating = 93%  Please briefly explain why patient needs home oxygen: pt desat on room air while walking

## 2015-01-25 NOTE — Progress Notes (Signed)
PT Cancellation Note  Patient Details Name: Jasmine [name] MRN: [id number] DOB: Jan 25, 1923   Cancelled Treatment:    Reason Eval/Treat Not Completed: Other (comment); patient ready for d/c.  Deferred treatment due to pending D/C.  Spoke briefly with patient and daughter in room about safety at home.   Mikaele Stecher,CYNDI 01/25/2015, 3:25 PM Sheran Lawless, PT [phone] 01/25/2015

## 2015-01-25 NOTE — Discharge Instructions (Signed)
Follow with Nicki Reaper, NP in 5-7 days  Please get a complete blood count and chemistry panel checked by your Primary MD at your next visit, and again as instructed by your Primary MD. Please get your medications reviewed and adjusted by your Primary MD.  Please request your Primary MD to go over all Hospital Tests and Procedure/Radiological results at the follow up, please get all Hospital records sent to your Prim MD by signing hospital release before you go home.  If you had Pneumonia of Lung problems at the Hospital: Please get a 2 view Chest X ray done in 6-8 weeks after hospital discharge or sooner if instructed by your Primary MD.  If you have Congestive Heart Failure: Please call your Cardiologist or Primary MD anytime you have any of the following symptoms:  1) 3 pound weight gain in 24 hours or 5 pounds in 1 week  2) shortness of breath, with or without a dry hacking cough  3) swelling in the hands, feet or stomach  4) if you have to sleep on extra pillows at night in order to breathe  Follow cardiac low salt diet and 1.5 lit/day fluid restriction.  If you have diabetes Accuchecks 4 times/day, Once in AM empty stomach and then before each meal. Log in all results and show them to your primary doctor at your next visit. If any glucose reading is under 80 or above 300 call your primary MD immediately.  If you have Seizure/Convulsions/Epilepsy: Please do not drive, operate heavy machinery, participate in activities at heights or participate in high speed sports until you have seen by Primary MD or a Neurologist and advised to do so again.  If you had Gastrointestinal Bleeding: Please ask your Primary MD to check a complete blood count within one week of discharge or at your next visit. Your endoscopic/colonoscopic biopsies that are pending at the time of discharge, will also need to followed by your Primary MD.  Get Medicines reviewed and adjusted. Please take all your  medications with you for your next visit with your Primary MD  Please request your Primary MD to go over all hospital tests and procedure/radiological results at the follow up, please ask your Primary MD to get all Hospital records sent to his/her office.  If you experience worsening of your admission symptoms, develop shortness of breath, life threatening emergency, suicidal or homicidal thoughts you must seek medical attention immediately by calling 911 or calling your MD immediately  if symptoms less severe.  You must read complete instructions/literature along with all the possible adverse reactions/side effects for all the Medicines you take and that have been prescribed to you. Take any new Medicines after you have completely understood and accpet all the possible adverse reactions/side effects.   Do not drive or operate heavy machinery when taking Pain medications.   Do not take more than prescribed Pain, Sleep and Anxiety Medications  Special Instructions: If you have smoked or chewed Tobacco  in the last 2 yrs please stop smoking, stop any regular Alcohol  and or any Recreational drug use.  Wear Seat belts while driving.  Please note You were cared for by a hospitalist during your hospital stay. If you have any questions about your discharge medications or the care you received while you were in the hospital after you are discharged, you can call the unit and asked to speak with the hospitalist on call if the hospitalist that took care of you is not available. Once  you are discharged, your primary care physician will handle any further medical issues. Please note that NO REFILLS for any discharge medications will be authorized once you are discharged, as it is imperative that you return to your primary care physician (or establish a relationship with a primary care physician if you do not have one) for your aftercare needs so that they can reassess your need for medications and monitor your  lab values.  You can reach the hospitalist office at phone [phone] or fax [phone]   If you do not have a primary care physician, you can call [phone] for a physician referral.  Activity: As tolerated with Full fall precautions use walker/cane & assistance as needed  Diet: regular  Disposition Home

## 2015-01-26 DIAGNOSIS — I5033 Acute on chronic diastolic (congestive) heart failure: Secondary | ICD-10-CM | POA: Diagnosis not present

## 2015-01-26 DIAGNOSIS — I5020 Unspecified systolic (congestive) heart failure: Secondary | ICD-10-CM | POA: Diagnosis not present

## 2015-01-26 DIAGNOSIS — Z9181 History of falling: Secondary | ICD-10-CM | POA: Diagnosis not present

## 2015-01-26 DIAGNOSIS — S06330D Contusion and laceration of cerebrum, unspecified, without loss of consciousness, subsequent encounter: Secondary | ICD-10-CM | POA: Diagnosis not present

## 2015-01-26 DIAGNOSIS — E039 Hypothyroidism, unspecified: Secondary | ICD-10-CM | POA: Diagnosis not present

## 2015-01-26 DIAGNOSIS — Z87891 Personal history of nicotine dependence: Secondary | ICD-10-CM | POA: Diagnosis not present

## 2015-01-26 DIAGNOSIS — N183 Chronic kidney disease, stage 3 (moderate): Secondary | ICD-10-CM | POA: Diagnosis not present

## 2015-01-26 DIAGNOSIS — I129 Hypertensive chronic kidney disease with stage 1 through stage 4 chronic kidney disease, or unspecified chronic kidney disease: Secondary | ICD-10-CM | POA: Diagnosis not present

## 2015-01-26 DIAGNOSIS — I482 Chronic atrial fibrillation: Secondary | ICD-10-CM | POA: Diagnosis not present

## 2015-01-26 DIAGNOSIS — K219 Gastro-esophageal reflux disease without esophagitis: Secondary | ICD-10-CM | POA: Diagnosis not present

## 2015-01-26 DIAGNOSIS — J9621 Acute and chronic respiratory failure with hypoxia: Secondary | ICD-10-CM | POA: Diagnosis not present

## 2015-01-26 DIAGNOSIS — I2510 Atherosclerotic heart disease of native coronary artery without angina pectoris: Secondary | ICD-10-CM | POA: Diagnosis not present

## 2015-01-28 ENCOUNTER — Telehealth: Payer: Self-pay | Admitting: Internal Medicine

## 2015-01-28 ENCOUNTER — Ambulatory Visit (INDEPENDENT_AMBULATORY_CARE_PROVIDER_SITE_OTHER): Payer: Medicare Other | Admitting: Internal Medicine

## 2015-01-28 ENCOUNTER — Telehealth: Payer: Self-pay | Admitting: *Deleted

## 2015-01-28 ENCOUNTER — Encounter: Payer: Self-pay | Admitting: Internal Medicine

## 2015-01-28 VITALS — BP 120/68 | HR 106 | Temp 97.4°F | Wt 104.0 lb

## 2015-01-28 DIAGNOSIS — I509 Heart failure, unspecified: Secondary | ICD-10-CM

## 2015-01-28 DIAGNOSIS — S0083XD Contusion of other part of head, subsequent encounter: Secondary | ICD-10-CM | POA: Diagnosis not present

## 2015-01-28 DIAGNOSIS — I482 Chronic atrial fibrillation, unspecified: Secondary | ICD-10-CM

## 2015-01-28 DIAGNOSIS — Y92009 Unspecified place in unspecified non-institutional (private) residence as the place of occurrence of the external cause: Secondary | ICD-10-CM

## 2015-01-28 DIAGNOSIS — W19XXXD Unspecified fall, subsequent encounter: Secondary | ICD-10-CM

## 2015-01-28 MED ORDER — BISOPROLOL FUMARATE 10 MG PO TABS: 10.0000 mg | ORAL_TABLET | Freq: Every day | ORAL | Status: DC

## 2015-01-28 NOTE — Telephone Encounter (Signed)
ok 

## 2015-01-28 NOTE — Telephone Encounter (Signed)
Transition Care Management Follow-up Telephone Call   Date discharged? 01/25/15   How have you been since you were released from the hospital? Weak, SOB improved   Do you understand why you were in the hospital? yes   Do you understand the discharge instructions? yes   Where were you discharged to? Home   Items Reviewed:  Medications reviewed: yes  Allergies reviewed: yes  Dietary changes reviewed: no  Referrals reviewed: yes, home health   Functional Questionnaire:   Activities of Daily Living (ADLs):   She states they are independent in the following: ambulation, bathing and hygiene, feeding, continence, grooming, toileting and dressing States they require assistance with the following: None   Any transportation issues/concerns?: no   Any patient concerns? yes, patient is able to ambulate with a walker but is "afraid" of getting up and around   Confirmed importance and date/time of follow-up visits scheduled yes, 01/28/15 at 1545   Provider Appointment booked with Nicki Reaper, NP  Confirmed with patient if condition begins to worsen call PCP or go to the ER.  Patient was given the office number and encouraged to call back with question or concerns.  : yes

## 2015-01-28 NOTE — Telephone Encounter (Signed)
Threasa Alpha, physical therapist with [facility] recently evaluated pt after recent hospital admission on 01/22/15.  He would like verbal order for evaluation for PT,OT,ST and MSW.  1.  Skilled Nursing for medication management and congestive heart failure management.   2.  Occupation therapy for ADL's and weakness and pain in the left upper extremity. 3.  Speech therapy for difficulty swallowing of medication.   4.  Medical Social worker for [employer].    Best number to call Threasa Alpha for verbal order confirmation is [phone] / lt

## 2015-01-28 NOTE — Progress Notes (Signed)
Pre visit review using our clinic review tool, if applicable. No additional management support is needed unless otherwise documented below in the visit note. 

## 2015-01-28 NOTE — Telephone Encounter (Signed)
Ok for wheelchair?  

## 2015-01-28 NOTE — Progress Notes (Signed)
Subjective:    Patient ID: Jasmine [name], female    DOB: October 26, 1922, 81 y.o.   MRN: [id number]  HPI  Pt presents to the clinic today for hospital follow up. She went to the ER 10/4 after a fall in which she hit her head. CT of the head showed a small contusion, but no bleed. CT of the neck showed degenerative changes but no fracture. She was hypoxic on exam. Chest xray showed some fluid overload. Echo showed EF of 55%. She was treated with IV Lasix. She was continued on Doxy that she was given prior to hospitilazation for acute bronchitis. She did have to be discharge on oxygen secondary to continued decreased sats. Since her discharge, she reports she has been feeling very tired. Her appetite is poor. She denies chest pain or shortness of breath. She denies any issues with her bowel or her bladder.  Review of Systems      Past Medical History  Diagnosis Date  . Insomnia due to medical condition   . Anxiety   . Hyperlipidemia   . Hypertension   . Hypothyroidism   . Low back pain   . Left wrist fracture 09/2007  . Fracture of right shoulder 02/2008  . GERD (gastroesophageal reflux disease)   . Barrett's esophagus   . Hemorrhoids, external   . CAD (coronary artery disease) of artery bypass graft 1995  . Hiatal hernia   . History of colon polyps   . Atrial fibrillation with RVR (HCC)   . Syncope     Current Outpatient Prescriptions  Medication Sig Dispense Refill  . acetaminophen (TYLENOL) 500 MG tablet Take 500 mg by mouth every 6 (six) hours as needed for mild pain. For pain    . aspirin 81 MG tablet Take 81 mg by mouth daily.      Marland Kitchen atorvastatin (LIPITOR) 80 MG tablet TAKE 1 TABLET BY MOUTH DAILY 90 tablet 1  . bisoprolol (ZEBETA) 5 MG tablet TAKE 1 TABLET DAILY 90 tablet 0  . Calcium Carbonate-Vitamin D (CALCIUM 600-D) 600-400 MG-UNIT per tablet Take 1 tablet by mouth daily.     . clonazePAM (KLONOPIN) 1 MG tablet TAKE 1/2 TABLET BY MOUTH EVERY MORNING, 1/2 TABLET AT 6PM, AND  1 TABLET AT BEDTIME 60 tablet 0  . cyanocobalamin 500 MCG tablet Take 500 mcg by mouth daily.      Marland Kitchen diltiazem (TIAZAC) 120 MG 24 hr capsule Take 1 capsule (120 mg total) by mouth daily. 90 capsule 2  . doxycycline (VIBRA-TABS) 100 MG tablet Take 1 tablet (100 mg total) by mouth 2 (two) times daily. 20 tablet 0  . furosemide (LASIX) 20 MG tablet Take 1 tablet (20 mg total) by mouth daily as needed for fluid or edema. 30 tablet 0  . levothyroxine (SYNTHROID, LEVOTHROID) 88 MCG tablet TAKE 1 TABLET DAILY 90 tablet 1  . lubiprostone (AMITIZA) 24 MCG capsule Take 1 capsule (24 mcg total) by mouth 2 (two) times daily with a meal. (Patient taking differently: Take 24 mcg by mouth daily with breakfast. ) 180 capsule 3  . mirtazapine (REMERON) 45 MG tablet TAKE 1 TABLET AT BEDTIME 90 tablet 0  . Multiple Vitamin (MULTIVITAMIN) capsule Take 1 capsule by mouth daily.      Marland Kitchen omeprazole (PRILOSEC) 20 MG capsule Take 1 capsule (20 mg total) by mouth daily. 90 capsule 2  . QUEtiapine (SEROQUEL) 25 MG tablet Take 1 tablet (25 mg total) by mouth at bedtime. 90 tablet 1  .  traMADol (ULTRAM) 50 MG tablet Take 1 tablet (50 mg total) by mouth every 8 (eight) hours as needed. (Patient taking differently: Take 50 mg by mouth every 8 (eight) hours as needed for moderate pain. ) 30 tablet 0  . vitamin E 400 UNIT capsule Take 400 Units by mouth daily.       No current facility-administered medications for this visit.    Allergies  Allergen Reactions  . Codeine     unknown  . Meperidine Hcl     unknown  . Oxycodone-Acetaminophen     unknown    Family History  Problem Relation Age of Onset  . Diabetes Son   . Heart disease Mother   . Heart disease Father     Social History   Social History  . Marital Status: Widowed    Spouse Name: N/A  . Number of Children: N/A  . Years of Education: N/A   Occupational History  . Not on file.   Social History Main Topics  . Smoking status: Former Smoker    Quit  date: 04/20/1968  . Smokeless tobacco: Never Used     Comment: QUIT IN 1970  . Alcohol Use: No     Comment: STOPPED IN 1970  . Drug Use: No  . Sexual Activity: Not Currently   Other Topics Concern  . Not on file   Social History Narrative     Constitutional: Pt reports fatigue. Denies fever, malaise, headache or abrupt weight changes.  Respiratory: Pt reports cough. Denies difficulty breathing, shortness of breath, or sputum production.   Cardiovascular: Denies chest pain, chest tightness, palpitations or swelling in the hands or feet.  Gastrointestinal: Denies abdominal pain, bloating, constipation, diarrhea or blood in the stool.  GU: Denies urgency, frequency, pain with urination, burning sensation, blood in urine, odor or discharge. Skin: Pt reports hematoma to the back of her head. Denies redness, rashes, lesions or ulcercations.  Neurological: Denies dizziness, difficulty with memory, difficulty with speech or problems with balance and coordination.  Psych: Denies anxiety, depression, SI/HI.  No other specific complaints in a complete review of systems (except as listed in HPI above).  Objective:   Physical Exam  BP 120/68 mmHg  Pulse 106  Temp(Src) 97.4 F (36.3 C) (Oral)  Wt 104 lb (47.174 kg)  SpO2 99% Wt Readings from Last 3 Encounters:  01/28/15 104 lb (47.174 kg)  01/25/15 104 lb 1.6 oz (47.219 kg)  01/17/15 109 lb (49.442 kg)    General: Appears her stated age, chronically ill apppearing in NAD. Skin: Walnut size hematoma noted of posterior head. Bruising noted of left shoulder. Cardiovascular: Tachycardic with irregular rhythm. No lower extremity edema noted. Pulmonary/Chest: Clear but diminished. Abdomen: Soft and nontender.  Neurological: Alert and oriented.     BMET    Component Value Date/Time   NA 129* 01/24/2015 1308   K 4.0 01/24/2015 1308   CL 88* 01/24/2015 1308   CO2 38* 01/24/2015 1308   GLUCOSE 115* 01/24/2015 1308   BUN 18 01/24/2015  1308   CREATININE 1.10* 01/24/2015 1308   CREATININE 1.00 01/15/2014 1528   CALCIUM 8.2* 01/24/2015 1308   GFRNONAA 43* 01/24/2015 1308   GFRAA 50* 01/24/2015 1308    Lipid Panel     Component Value Date/Time   CHOL 131 12/03/2014 1655   TRIG 46.0 12/03/2014 1655   HDL 53.90 12/03/2014 1655   CHOLHDL 2 12/03/2014 1655   VLDL 9.2 12/03/2014 1655   LDLCALC 68 12/03/2014 1655  CBC    Component Value Date/Time   WBC 7.4 01/24/2015 1308   RBC 3.91 01/24/2015 1308   HGB 11.9* 01/24/2015 1308   HCT 36.6 01/24/2015 1308   PLT 111* 01/24/2015 1308   MCV 93.6 01/24/2015 1308   MCH 30.4 01/24/2015 1308   MCHC 32.5 01/24/2015 1308   RDW 14.1 01/24/2015 1308   LYMPHSABS 0.6* 01/22/2015 1816   MONOABS 0.9 01/22/2015 1816   EOSABS 0.0 01/22/2015 1816   BASOSABS 0.0 01/22/2015 1816    Hgb A1C Lab Results  Component Value Date   HGBA1C 6.0* 01/23/2015         Assessment & Plan:   Hospital follow up for hematoma of head s/p fall with acute onset of CHF:  Hospital notes, labs and imaging reviewed Reassurance given that hematoma should resolve with time No history of heart failure, now on lasix- seems euvolemic today On Bisoprolol (mainly for afib), will increase to 10 mg daily Continue Diltiazem as prescribed Will check BMET and bnp today Ok to wear oxygen on prn basis, not continuous  Make an appt for your routine follow up exam

## 2015-01-28 NOTE — Patient Instructions (Signed)

## 2015-01-28 NOTE — Telephone Encounter (Signed)
Spoke to [place] with [facility] gave the verbal okay---pt's daughter also wanted to know about ordering pt a wheel chair---would you approve the order as it is more difficult for pt to get around---please advise

## 2015-01-29 ENCOUNTER — Other Ambulatory Visit: Payer: Self-pay | Admitting: Internal Medicine

## 2015-01-29 DIAGNOSIS — I129 Hypertensive chronic kidney disease with stage 1 through stage 4 chronic kidney disease, or unspecified chronic kidney disease: Secondary | ICD-10-CM | POA: Diagnosis not present

## 2015-01-29 DIAGNOSIS — Z9181 History of falling: Secondary | ICD-10-CM | POA: Diagnosis not present

## 2015-01-29 DIAGNOSIS — J9621 Acute and chronic respiratory failure with hypoxia: Secondary | ICD-10-CM | POA: Diagnosis not present

## 2015-01-29 DIAGNOSIS — I5033 Acute on chronic diastolic (congestive) heart failure: Secondary | ICD-10-CM | POA: Diagnosis not present

## 2015-01-29 DIAGNOSIS — S06330D Contusion and laceration of cerebrum, unspecified, without loss of consciousness, subsequent encounter: Secondary | ICD-10-CM | POA: Diagnosis not present

## 2015-01-29 DIAGNOSIS — Z7409 Other reduced mobility: Secondary | ICD-10-CM

## 2015-01-29 DIAGNOSIS — N183 Chronic kidney disease, stage 3 (moderate): Secondary | ICD-10-CM | POA: Diagnosis not present

## 2015-01-29 LAB — BASIC METABOLIC PANEL
BUN: 18 mg/dL (ref 6–23)
CALCIUM: 9.2 mg/dL (ref 8.4–10.5)
CO2: 34 meq/L — AB (ref 19–32)
CREATININE: 1 mg/dL (ref 0.40–1.20)
Chloride: 89 mEq/L — ABNORMAL LOW (ref 96–112)
GFR: 55.31 mL/min — ABNORMAL LOW (ref >60.00)
Glucose, Bld: 115 mg/dL — ABNORMAL HIGH (ref 70–99)
Potassium: 4.2 mEq/L (ref 3.5–5.1)
Sodium: 134 mEq/L — ABNORMAL LOW (ref 135–145)

## 2015-01-29 LAB — BRAIN NATRIURETIC PEPTIDE: Pro B Natriuretic peptide (BNP): 305 pg/mL — ABNORMAL HIGH (ref 0.0–100.0)

## 2015-01-29 NOTE — Telephone Encounter (Signed)
Please print Rx and I will call pt's daughter

## 2015-01-29 NOTE — Addendum Note (Signed)
Addended by: Alvina Chou on: 01/29/2015 08:18 AM   Modules accepted: Orders

## 2015-01-29 NOTE — Telephone Encounter (Signed)
RX printed and signed and given to Melanie 

## 2015-01-30 ENCOUNTER — Other Ambulatory Visit: Payer: Self-pay

## 2015-01-30 MED ORDER — FUROSEMIDE 20 MG PO TABS: 20.0000 mg | ORAL_TABLET | Freq: Every day | ORAL | Status: DC | PRN

## 2015-01-30 NOTE — Telephone Encounter (Signed)
Pt's daughter is aware Rx in front office for pick up

## 2015-01-31 ENCOUNTER — Telehealth: Payer: Self-pay | Admitting: *Deleted

## 2015-01-31 DIAGNOSIS — I5033 Acute on chronic diastolic (congestive) heart failure: Secondary | ICD-10-CM | POA: Diagnosis not present

## 2015-01-31 DIAGNOSIS — N183 Chronic kidney disease, stage 3 (moderate): Secondary | ICD-10-CM | POA: Diagnosis not present

## 2015-01-31 DIAGNOSIS — Z9181 History of falling: Secondary | ICD-10-CM | POA: Diagnosis not present

## 2015-01-31 DIAGNOSIS — J9621 Acute and chronic respiratory failure with hypoxia: Secondary | ICD-10-CM | POA: Diagnosis not present

## 2015-01-31 DIAGNOSIS — I129 Hypertensive chronic kidney disease with stage 1 through stage 4 chronic kidney disease, or unspecified chronic kidney disease: Secondary | ICD-10-CM | POA: Diagnosis not present

## 2015-01-31 DIAGNOSIS — S06330D Contusion and laceration of cerebrum, unspecified, without loss of consciousness, subsequent encounter: Secondary | ICD-10-CM | POA: Diagnosis not present

## 2015-01-31 NOTE — Telephone Encounter (Signed)
Patient was given a written order for a wheelchair.  [facility] needs this to be faxed to [phone].

## 2015-02-01 ENCOUNTER — Other Ambulatory Visit: Payer: Self-pay | Admitting: Internal Medicine

## 2015-02-01 DIAGNOSIS — S06330D Contusion and laceration of cerebrum, unspecified, without loss of consciousness, subsequent encounter: Secondary | ICD-10-CM | POA: Diagnosis not present

## 2015-02-01 DIAGNOSIS — N183 Chronic kidney disease, stage 3 (moderate): Secondary | ICD-10-CM | POA: Diagnosis not present

## 2015-02-01 DIAGNOSIS — R2689 Other abnormalities of gait and mobility: Secondary | ICD-10-CM

## 2015-02-01 DIAGNOSIS — I129 Hypertensive chronic kidney disease with stage 1 through stage 4 chronic kidney disease, or unspecified chronic kidney disease: Secondary | ICD-10-CM | POA: Diagnosis not present

## 2015-02-01 DIAGNOSIS — J9621 Acute and chronic respiratory failure with hypoxia: Secondary | ICD-10-CM | POA: Diagnosis not present

## 2015-02-01 DIAGNOSIS — Z9181 History of falling: Secondary | ICD-10-CM | POA: Diagnosis not present

## 2015-02-01 DIAGNOSIS — I5033 Acute on chronic diastolic (congestive) heart failure: Secondary | ICD-10-CM | POA: Diagnosis not present

## 2015-02-01 NOTE — Telephone Encounter (Signed)
RX printed and signed and placed in MYD box 

## 2015-02-01 NOTE — Telephone Encounter (Signed)
Rx faxed

## 2015-02-01 NOTE — Telephone Encounter (Signed)
Rx has been picked up, can you please reprint so I can fax

## 2015-02-04 DIAGNOSIS — N183 Chronic kidney disease, stage 3 (moderate): Secondary | ICD-10-CM | POA: Diagnosis not present

## 2015-02-04 DIAGNOSIS — J9621 Acute and chronic respiratory failure with hypoxia: Secondary | ICD-10-CM | POA: Diagnosis not present

## 2015-02-04 DIAGNOSIS — Z9181 History of falling: Secondary | ICD-10-CM | POA: Diagnosis not present

## 2015-02-04 DIAGNOSIS — I129 Hypertensive chronic kidney disease with stage 1 through stage 4 chronic kidney disease, or unspecified chronic kidney disease: Secondary | ICD-10-CM | POA: Diagnosis not present

## 2015-02-04 DIAGNOSIS — S06330D Contusion and laceration of cerebrum, unspecified, without loss of consciousness, subsequent encounter: Secondary | ICD-10-CM | POA: Diagnosis not present

## 2015-02-04 DIAGNOSIS — I5033 Acute on chronic diastolic (congestive) heart failure: Secondary | ICD-10-CM | POA: Diagnosis not present

## 2015-02-05 DIAGNOSIS — N183 Chronic kidney disease, stage 3 (moderate): Secondary | ICD-10-CM | POA: Diagnosis not present

## 2015-02-05 DIAGNOSIS — Z9181 History of falling: Secondary | ICD-10-CM | POA: Diagnosis not present

## 2015-02-05 DIAGNOSIS — I5033 Acute on chronic diastolic (congestive) heart failure: Secondary | ICD-10-CM | POA: Diagnosis not present

## 2015-02-05 DIAGNOSIS — S06330D Contusion and laceration of cerebrum, unspecified, without loss of consciousness, subsequent encounter: Secondary | ICD-10-CM | POA: Diagnosis not present

## 2015-02-05 DIAGNOSIS — I129 Hypertensive chronic kidney disease with stage 1 through stage 4 chronic kidney disease, or unspecified chronic kidney disease: Secondary | ICD-10-CM | POA: Diagnosis not present

## 2015-02-05 DIAGNOSIS — J9621 Acute and chronic respiratory failure with hypoxia: Secondary | ICD-10-CM | POA: Diagnosis not present

## 2015-02-06 DIAGNOSIS — I129 Hypertensive chronic kidney disease with stage 1 through stage 4 chronic kidney disease, or unspecified chronic kidney disease: Secondary | ICD-10-CM | POA: Diagnosis not present

## 2015-02-06 DIAGNOSIS — S06330D Contusion and laceration of cerebrum, unspecified, without loss of consciousness, subsequent encounter: Secondary | ICD-10-CM | POA: Diagnosis not present

## 2015-02-06 DIAGNOSIS — J9621 Acute and chronic respiratory failure with hypoxia: Secondary | ICD-10-CM | POA: Diagnosis not present

## 2015-02-06 DIAGNOSIS — Z9181 History of falling: Secondary | ICD-10-CM | POA: Diagnosis not present

## 2015-02-06 DIAGNOSIS — I5033 Acute on chronic diastolic (congestive) heart failure: Secondary | ICD-10-CM | POA: Diagnosis not present

## 2015-02-06 DIAGNOSIS — N183 Chronic kidney disease, stage 3 (moderate): Secondary | ICD-10-CM | POA: Diagnosis not present

## 2015-02-07 DIAGNOSIS — S06330D Contusion and laceration of cerebrum, unspecified, without loss of consciousness, subsequent encounter: Secondary | ICD-10-CM | POA: Diagnosis not present

## 2015-02-07 DIAGNOSIS — N183 Chronic kidney disease, stage 3 (moderate): Secondary | ICD-10-CM | POA: Diagnosis not present

## 2015-02-07 DIAGNOSIS — J9621 Acute and chronic respiratory failure with hypoxia: Secondary | ICD-10-CM | POA: Diagnosis not present

## 2015-02-07 DIAGNOSIS — I129 Hypertensive chronic kidney disease with stage 1 through stage 4 chronic kidney disease, or unspecified chronic kidney disease: Secondary | ICD-10-CM | POA: Diagnosis not present

## 2015-02-07 DIAGNOSIS — I5033 Acute on chronic diastolic (congestive) heart failure: Secondary | ICD-10-CM | POA: Diagnosis not present

## 2015-02-07 DIAGNOSIS — Z9181 History of falling: Secondary | ICD-10-CM | POA: Diagnosis not present

## 2015-02-08 DIAGNOSIS — S06330D Contusion and laceration of cerebrum, unspecified, without loss of consciousness, subsequent encounter: Secondary | ICD-10-CM | POA: Diagnosis not present

## 2015-02-08 DIAGNOSIS — Z9181 History of falling: Secondary | ICD-10-CM | POA: Diagnosis not present

## 2015-02-08 DIAGNOSIS — I5033 Acute on chronic diastolic (congestive) heart failure: Secondary | ICD-10-CM | POA: Diagnosis not present

## 2015-02-08 DIAGNOSIS — I129 Hypertensive chronic kidney disease with stage 1 through stage 4 chronic kidney disease, or unspecified chronic kidney disease: Secondary | ICD-10-CM | POA: Diagnosis not present

## 2015-02-08 DIAGNOSIS — J9621 Acute and chronic respiratory failure with hypoxia: Secondary | ICD-10-CM | POA: Diagnosis not present

## 2015-02-08 DIAGNOSIS — N183 Chronic kidney disease, stage 3 (moderate): Secondary | ICD-10-CM | POA: Diagnosis not present

## 2015-02-11 DIAGNOSIS — I5033 Acute on chronic diastolic (congestive) heart failure: Secondary | ICD-10-CM | POA: Diagnosis not present

## 2015-02-11 DIAGNOSIS — J9621 Acute and chronic respiratory failure with hypoxia: Secondary | ICD-10-CM | POA: Diagnosis not present

## 2015-02-11 DIAGNOSIS — I129 Hypertensive chronic kidney disease with stage 1 through stage 4 chronic kidney disease, or unspecified chronic kidney disease: Secondary | ICD-10-CM | POA: Diagnosis not present

## 2015-02-11 DIAGNOSIS — Z9181 History of falling: Secondary | ICD-10-CM | POA: Diagnosis not present

## 2015-02-11 DIAGNOSIS — N183 Chronic kidney disease, stage 3 (moderate): Secondary | ICD-10-CM | POA: Diagnosis not present

## 2015-02-11 DIAGNOSIS — S06330D Contusion and laceration of cerebrum, unspecified, without loss of consciousness, subsequent encounter: Secondary | ICD-10-CM | POA: Diagnosis not present

## 2015-02-12 DIAGNOSIS — Z9181 History of falling: Secondary | ICD-10-CM | POA: Diagnosis not present

## 2015-02-12 DIAGNOSIS — S06330D Contusion and laceration of cerebrum, unspecified, without loss of consciousness, subsequent encounter: Secondary | ICD-10-CM | POA: Diagnosis not present

## 2015-02-12 DIAGNOSIS — J9621 Acute and chronic respiratory failure with hypoxia: Secondary | ICD-10-CM | POA: Diagnosis not present

## 2015-02-12 DIAGNOSIS — I129 Hypertensive chronic kidney disease with stage 1 through stage 4 chronic kidney disease, or unspecified chronic kidney disease: Secondary | ICD-10-CM | POA: Diagnosis not present

## 2015-02-12 DIAGNOSIS — N183 Chronic kidney disease, stage 3 (moderate): Secondary | ICD-10-CM | POA: Diagnosis not present

## 2015-02-12 DIAGNOSIS — I5033 Acute on chronic diastolic (congestive) heart failure: Secondary | ICD-10-CM | POA: Diagnosis not present

## 2015-02-13 ENCOUNTER — Other Ambulatory Visit: Payer: Self-pay | Admitting: Internal Medicine

## 2015-02-13 DIAGNOSIS — I129 Hypertensive chronic kidney disease with stage 1 through stage 4 chronic kidney disease, or unspecified chronic kidney disease: Secondary | ICD-10-CM | POA: Diagnosis not present

## 2015-02-13 DIAGNOSIS — S06330D Contusion and laceration of cerebrum, unspecified, without loss of consciousness, subsequent encounter: Secondary | ICD-10-CM | POA: Diagnosis not present

## 2015-02-13 DIAGNOSIS — Z9181 History of falling: Secondary | ICD-10-CM | POA: Diagnosis not present

## 2015-02-13 DIAGNOSIS — J9621 Acute and chronic respiratory failure with hypoxia: Secondary | ICD-10-CM | POA: Diagnosis not present

## 2015-02-13 DIAGNOSIS — N183 Chronic kidney disease, stage 3 (moderate): Secondary | ICD-10-CM | POA: Diagnosis not present

## 2015-02-13 DIAGNOSIS — I5033 Acute on chronic diastolic (congestive) heart failure: Secondary | ICD-10-CM | POA: Diagnosis not present

## 2015-02-13 NOTE — Telephone Encounter (Signed)
Daughter calling asking if refill will be done today? Please advise

## 2015-02-13 NOTE — Telephone Encounter (Signed)
Rx called in to pharmacy. 

## 2015-02-13 NOTE — Telephone Encounter (Signed)
Ok to phone in Clonazepam 

## 2015-02-14 DIAGNOSIS — N183 Chronic kidney disease, stage 3 (moderate): Secondary | ICD-10-CM | POA: Diagnosis not present

## 2015-02-14 DIAGNOSIS — J9621 Acute and chronic respiratory failure with hypoxia: Secondary | ICD-10-CM | POA: Diagnosis not present

## 2015-02-14 DIAGNOSIS — I129 Hypertensive chronic kidney disease with stage 1 through stage 4 chronic kidney disease, or unspecified chronic kidney disease: Secondary | ICD-10-CM | POA: Diagnosis not present

## 2015-02-14 DIAGNOSIS — I5033 Acute on chronic diastolic (congestive) heart failure: Secondary | ICD-10-CM | POA: Diagnosis not present

## 2015-02-14 DIAGNOSIS — S06330D Contusion and laceration of cerebrum, unspecified, without loss of consciousness, subsequent encounter: Secondary | ICD-10-CM | POA: Diagnosis not present

## 2015-02-14 DIAGNOSIS — Z9181 History of falling: Secondary | ICD-10-CM | POA: Diagnosis not present

## 2015-02-18 ENCOUNTER — Telehealth: Payer: Self-pay

## 2015-02-18 NOTE — Telephone Encounter (Signed)
Ok to give order as requested. 

## 2015-02-18 NOTE — Telephone Encounter (Signed)
Jasmine [name] pts daughter Risk analyst(DPR) wants top part of oxygen concentrator and portable tanks removed (that part is used to refill oxygen tanks for portable use) pt only uses oxygen concentrator at night. Wants [facility] [phone] notified to pick up the top of O2 concentrator and portable tanks. [name] NP out of office this week and will send to Dr Dayton MartesAron. (If power goes out has generator and will not need tanks even for power outage.)

## 2015-02-19 DIAGNOSIS — J9621 Acute and chronic respiratory failure with hypoxia: Secondary | ICD-10-CM | POA: Diagnosis not present

## 2015-02-19 DIAGNOSIS — N183 Chronic kidney disease, stage 3 (moderate): Secondary | ICD-10-CM | POA: Diagnosis not present

## 2015-02-19 DIAGNOSIS — S06330D Contusion and laceration of cerebrum, unspecified, without loss of consciousness, subsequent encounter: Secondary | ICD-10-CM | POA: Diagnosis not present

## 2015-02-19 DIAGNOSIS — I5033 Acute on chronic diastolic (congestive) heart failure: Secondary | ICD-10-CM | POA: Diagnosis not present

## 2015-02-19 DIAGNOSIS — I129 Hypertensive chronic kidney disease with stage 1 through stage 4 chronic kidney disease, or unspecified chronic kidney disease: Secondary | ICD-10-CM | POA: Diagnosis not present

## 2015-02-19 DIAGNOSIS — Z9181 History of falling: Secondary | ICD-10-CM | POA: Diagnosis not present

## 2015-02-19 NOTE — Telephone Encounter (Signed)
Order faxed to [facility].  

## 2015-02-19 NOTE — Telephone Encounter (Signed)
Order written and in my box. 

## 2015-02-19 NOTE — Telephone Encounter (Signed)
I called [facility] and they advised me a written order can be faxed to [phone] stating the O2 is for nocturnal use only as the order they have from the ED physician states it is for all day use---please advise if you can order and print order as I will fax or would you [facility]

## 2015-02-20 DIAGNOSIS — N183 Chronic kidney disease, stage 3 (moderate): Secondary | ICD-10-CM | POA: Diagnosis not present

## 2015-02-20 DIAGNOSIS — S06330D Contusion and laceration of cerebrum, unspecified, without loss of consciousness, subsequent encounter: Secondary | ICD-10-CM | POA: Diagnosis not present

## 2015-02-20 DIAGNOSIS — Z9181 History of falling: Secondary | ICD-10-CM | POA: Diagnosis not present

## 2015-02-20 DIAGNOSIS — J9621 Acute and chronic respiratory failure with hypoxia: Secondary | ICD-10-CM | POA: Diagnosis not present

## 2015-02-20 DIAGNOSIS — I129 Hypertensive chronic kidney disease with stage 1 through stage 4 chronic kidney disease, or unspecified chronic kidney disease: Secondary | ICD-10-CM | POA: Diagnosis not present

## 2015-02-20 DIAGNOSIS — I5033 Acute on chronic diastolic (congestive) heart failure: Secondary | ICD-10-CM | POA: Diagnosis not present

## 2015-02-21 DIAGNOSIS — J9621 Acute and chronic respiratory failure with hypoxia: Secondary | ICD-10-CM | POA: Diagnosis not present

## 2015-02-21 DIAGNOSIS — S06330D Contusion and laceration of cerebrum, unspecified, without loss of consciousness, subsequent encounter: Secondary | ICD-10-CM | POA: Diagnosis not present

## 2015-02-21 DIAGNOSIS — I129 Hypertensive chronic kidney disease with stage 1 through stage 4 chronic kidney disease, or unspecified chronic kidney disease: Secondary | ICD-10-CM | POA: Diagnosis not present

## 2015-02-21 DIAGNOSIS — I5033 Acute on chronic diastolic (congestive) heart failure: Secondary | ICD-10-CM | POA: Diagnosis not present

## 2015-02-21 DIAGNOSIS — N183 Chronic kidney disease, stage 3 (moderate): Secondary | ICD-10-CM | POA: Diagnosis not present

## 2015-02-21 DIAGNOSIS — Z9181 History of falling: Secondary | ICD-10-CM | POA: Diagnosis not present

## 2015-02-25 DIAGNOSIS — Z9181 History of falling: Secondary | ICD-10-CM | POA: Diagnosis not present

## 2015-02-25 DIAGNOSIS — S06330D Contusion and laceration of cerebrum, unspecified, without loss of consciousness, subsequent encounter: Secondary | ICD-10-CM | POA: Diagnosis not present

## 2015-02-25 DIAGNOSIS — J9621 Acute and chronic respiratory failure with hypoxia: Secondary | ICD-10-CM | POA: Diagnosis not present

## 2015-02-25 DIAGNOSIS — I129 Hypertensive chronic kidney disease with stage 1 through stage 4 chronic kidney disease, or unspecified chronic kidney disease: Secondary | ICD-10-CM | POA: Diagnosis not present

## 2015-02-25 DIAGNOSIS — I5033 Acute on chronic diastolic (congestive) heart failure: Secondary | ICD-10-CM | POA: Diagnosis not present

## 2015-02-25 DIAGNOSIS — N183 Chronic kidney disease, stage 3 (moderate): Secondary | ICD-10-CM | POA: Diagnosis not present

## 2015-02-27 DIAGNOSIS — J9621 Acute and chronic respiratory failure with hypoxia: Secondary | ICD-10-CM | POA: Diagnosis not present

## 2015-02-27 DIAGNOSIS — S06330D Contusion and laceration of cerebrum, unspecified, without loss of consciousness, subsequent encounter: Secondary | ICD-10-CM | POA: Diagnosis not present

## 2015-02-27 DIAGNOSIS — I129 Hypertensive chronic kidney disease with stage 1 through stage 4 chronic kidney disease, or unspecified chronic kidney disease: Secondary | ICD-10-CM | POA: Diagnosis not present

## 2015-02-27 DIAGNOSIS — Z9181 History of falling: Secondary | ICD-10-CM | POA: Diagnosis not present

## 2015-02-27 DIAGNOSIS — I5033 Acute on chronic diastolic (congestive) heart failure: Secondary | ICD-10-CM | POA: Diagnosis not present

## 2015-02-27 DIAGNOSIS — N183 Chronic kidney disease, stage 3 (moderate): Secondary | ICD-10-CM | POA: Diagnosis not present

## 2015-03-07 DIAGNOSIS — J9621 Acute and chronic respiratory failure with hypoxia: Secondary | ICD-10-CM | POA: Diagnosis not present

## 2015-03-07 DIAGNOSIS — I5033 Acute on chronic diastolic (congestive) heart failure: Secondary | ICD-10-CM | POA: Diagnosis not present

## 2015-03-07 DIAGNOSIS — I129 Hypertensive chronic kidney disease with stage 1 through stage 4 chronic kidney disease, or unspecified chronic kidney disease: Secondary | ICD-10-CM | POA: Diagnosis not present

## 2015-03-07 DIAGNOSIS — Z9181 History of falling: Secondary | ICD-10-CM | POA: Diagnosis not present

## 2015-03-07 DIAGNOSIS — S06330D Contusion and laceration of cerebrum, unspecified, without loss of consciousness, subsequent encounter: Secondary | ICD-10-CM | POA: Diagnosis not present

## 2015-03-07 DIAGNOSIS — N183 Chronic kidney disease, stage 3 (moderate): Secondary | ICD-10-CM | POA: Diagnosis not present

## 2015-03-13 ENCOUNTER — Other Ambulatory Visit: Payer: Self-pay | Admitting: Internal Medicine

## 2015-03-19 ENCOUNTER — Other Ambulatory Visit: Payer: Self-pay | Admitting: Internal Medicine

## 2015-03-19 NOTE — Telephone Encounter (Signed)
Ok to phone in Clonazepam 

## 2015-03-19 NOTE — Telephone Encounter (Signed)
Rx called in to pharmacy. 

## 2015-03-19 NOTE — Telephone Encounter (Signed)
Last filled 02/13/15-please advise

## 2015-03-21 DIAGNOSIS — S06330D Contusion and laceration of cerebrum, unspecified, without loss of consciousness, subsequent encounter: Secondary | ICD-10-CM | POA: Diagnosis not present

## 2015-03-21 DIAGNOSIS — N183 Chronic kidney disease, stage 3 (moderate): Secondary | ICD-10-CM | POA: Diagnosis not present

## 2015-03-21 DIAGNOSIS — Z9181 History of falling: Secondary | ICD-10-CM | POA: Diagnosis not present

## 2015-03-21 DIAGNOSIS — I129 Hypertensive chronic kidney disease with stage 1 through stage 4 chronic kidney disease, or unspecified chronic kidney disease: Secondary | ICD-10-CM | POA: Diagnosis not present

## 2015-03-21 DIAGNOSIS — J9621 Acute and chronic respiratory failure with hypoxia: Secondary | ICD-10-CM | POA: Diagnosis not present

## 2015-03-21 DIAGNOSIS — I5033 Acute on chronic diastolic (congestive) heart failure: Secondary | ICD-10-CM | POA: Diagnosis not present

## 2015-03-31 ENCOUNTER — Other Ambulatory Visit: Payer: Self-pay | Admitting: Cardiovascular Disease

## 2015-04-01 NOTE — Telephone Encounter (Signed)
REFILL 

## 2015-04-02 ENCOUNTER — Encounter: Payer: Self-pay | Admitting: Internal Medicine

## 2015-04-02 ENCOUNTER — Ambulatory Visit (INDEPENDENT_AMBULATORY_CARE_PROVIDER_SITE_OTHER): Payer: Medicare Other | Admitting: Internal Medicine

## 2015-04-02 ENCOUNTER — Other Ambulatory Visit: Payer: Self-pay | Admitting: Internal Medicine

## 2015-04-02 VITALS — BP 118/64 | HR 60 | Temp 97.3°F | Wt 103.0 lb

## 2015-04-02 DIAGNOSIS — I2510 Atherosclerotic heart disease of native coronary artery without angina pectoris: Secondary | ICD-10-CM

## 2015-04-02 DIAGNOSIS — R0982 Postnasal drip: Secondary | ICD-10-CM

## 2015-04-02 DIAGNOSIS — R05 Cough: Secondary | ICD-10-CM

## 2015-04-02 DIAGNOSIS — R059 Cough, unspecified: Secondary | ICD-10-CM

## 2015-04-02 NOTE — Progress Notes (Signed)
Pre visit review using our clinic review tool, if applicable. No additional management support is needed unless otherwise documented below in the visit note. 

## 2015-04-02 NOTE — Patient Instructions (Signed)

## 2015-04-02 NOTE — Progress Notes (Signed)
HPI  Pt presents to the clinic today with c/o "gurgling sound" in her throat. Her daughter noticed this yesterday. She does have a slight cough at night. The cough is not productive. She denies runny nose, nasal congestion, ear pain or shortness of breath. She denies fever, chills or body aches. She has not tried anything OTC. She does have a history of CHF and Afib. She does take Bisoprolol, Diltiazem and Lasix as prescribed. She has had sick contacts.  Review of Systems      Past Medical History  Diagnosis Date  . Insomnia due to medical condition   . Anxiety   . Hyperlipidemia   . Hypertension   . Hypothyroidism   . Low back pain   . Left wrist fracture 09/2007  . Fracture of right shoulder 02/2008  . GERD (gastroesophageal reflux disease)   . Barrett's esophagus   . Hemorrhoids, external   . CAD (coronary artery disease) of artery bypass graft 1995  . Hiatal hernia   . History of colon polyps   . Atrial fibrillation with RVR (HCC)   . Syncope     Family History  Problem Relation Age of Onset  . Diabetes Son   . Heart disease Mother   . Heart disease Father     Social History   Social History  . Marital Status: Widowed    Spouse Name: N/A  . Number of Children: N/A  . Years of Education: N/A   Occupational History  . Not on file.   Social History Main Topics  . Smoking status: Former Smoker    Quit date: 04/20/1968  . Smokeless tobacco: Never Used     Comment: QUIT IN 1970  . Alcohol Use: No     Comment: STOPPED IN 1970  . Drug Use: No  . Sexual Activity: Not Currently   Other Topics Concern  . Not on file   Social History Narrative    Allergies  Allergen Reactions  . Codeine     unknown  . Meperidine Hcl     unknown  . Oxycodone-Acetaminophen     unknown     Constitutional:  Denies headache, fatigue, fever or abrupt weight changes.  HEENT:  Denies eye redness, eye pain, pressure behind the eyes, facial pain, nasal congestion, ear pain,  ringing in the ears, wax buildup, runny nose or sore throat. Respiratory: Positive cough. Denies difficulty breathing or shortness of breath.  Cardiovascular: Denies chest pain, chest tightness, palpitations or swelling in the hands or feet.   No other specific complaints in a complete review of systems (except as listed in HPI above).  Objective:   BP 118/64 mmHg  Pulse 60  Temp(Src) 97.3 F (36.3 C) (Oral)  Wt 103 lb (46.72 kg)  SpO2 97%  Wt Readings from Last 3 Encounters:  04/02/15 103 lb (46.72 kg)  01/28/15 104 lb (47.174 kg)  01/25/15 104 lb 1.6 oz (47.219 kg)     General: Appears her stated age, chronically ill appearing in NAD. HEENT: Head: normal shape and size; Eyes: sclera white, no icterus, conjunctiva pink; Ears: Tm's gray and intact, normal light reflex; Throat/Mouth: + PND. Teeth present, mucosa pink and moist, no exudate noted, no lesions or ulcerations noted.  Neck: No cervical lymphadenopathy.  Cardiovascular: Normal rate with irregular rhythm. S1,S2 noted.  No murmur, rubs or gallops noted.  Pulmonary/Chest: Normal effort and positive vesicular breath sounds. No respiratory distress. No wheezes, rales or ronchi noted.      Assessment &  Plan:   Cough secondary to PND:  Get some rest and drink plenty of water Start Claritin nightly x 2 weeks Robitussin as needed for cough  RTC as needed or if symptoms persist.

## 2015-04-07 ENCOUNTER — Emergency Department (HOSPITAL_COMMUNITY): Payer: Medicare Other

## 2015-04-07 ENCOUNTER — Inpatient Hospital Stay (HOSPITAL_COMMUNITY)
Admission: EM | Admit: 2015-04-07 | Discharge: 2015-04-10 | DRG: 291 | Disposition: A | Discharge status: Home Health | Payer: Medicare Other | Attending: Internal Medicine | Admitting: Internal Medicine

## 2015-04-07 ENCOUNTER — Encounter (HOSPITAL_COMMUNITY): Payer: Self-pay | Admitting: Emergency Medicine

## 2015-04-07 DIAGNOSIS — N390 Urinary tract infection, site not specified: Secondary | ICD-10-CM | POA: Diagnosis present

## 2015-04-07 DIAGNOSIS — Z66 Do not resuscitate: Secondary | ICD-10-CM | POA: Diagnosis present

## 2015-04-07 DIAGNOSIS — F411 Generalized anxiety disorder: Secondary | ICD-10-CM | POA: Diagnosis present

## 2015-04-07 DIAGNOSIS — K219 Gastro-esophageal reflux disease without esophagitis: Secondary | ICD-10-CM | POA: Diagnosis present

## 2015-04-07 DIAGNOSIS — D696 Thrombocytopenia, unspecified: Secondary | ICD-10-CM | POA: Diagnosis present

## 2015-04-07 DIAGNOSIS — I130 Hypertensive heart and chronic kidney disease with heart failure and stage 1 through stage 4 chronic kidney disease, or unspecified chronic kidney disease: Secondary | ICD-10-CM | POA: Diagnosis not present

## 2015-04-07 DIAGNOSIS — I482 Chronic atrial fibrillation, unspecified: Secondary | ICD-10-CM | POA: Diagnosis present

## 2015-04-07 DIAGNOSIS — J449 Chronic obstructive pulmonary disease, unspecified: Secondary | ICD-10-CM | POA: Diagnosis present

## 2015-04-07 DIAGNOSIS — I2510 Atherosclerotic heart disease of native coronary artery without angina pectoris: Secondary | ICD-10-CM | POA: Diagnosis present

## 2015-04-07 DIAGNOSIS — I10 Essential (primary) hypertension: Secondary | ICD-10-CM

## 2015-04-07 DIAGNOSIS — Z7982 Long term (current) use of aspirin: Secondary | ICD-10-CM | POA: Diagnosis not present

## 2015-04-07 DIAGNOSIS — Z87891 Personal history of nicotine dependence: Secondary | ICD-10-CM

## 2015-04-07 DIAGNOSIS — Z951 Presence of aortocoronary bypass graft: Secondary | ICD-10-CM | POA: Diagnosis not present

## 2015-04-07 DIAGNOSIS — R0602 Shortness of breath: Secondary | ICD-10-CM | POA: Diagnosis not present

## 2015-04-07 DIAGNOSIS — E039 Hypothyroidism, unspecified: Secondary | ICD-10-CM | POA: Diagnosis present

## 2015-04-07 DIAGNOSIS — N183 Chronic kidney disease, stage 3 unspecified: Secondary | ICD-10-CM | POA: Diagnosis present

## 2015-04-07 DIAGNOSIS — Z9181 History of falling: Secondary | ICD-10-CM | POA: Diagnosis not present

## 2015-04-07 DIAGNOSIS — E785 Hyperlipidemia, unspecified: Secondary | ICD-10-CM | POA: Diagnosis present

## 2015-04-07 DIAGNOSIS — I25810 Atherosclerosis of coronary artery bypass graft(s) without angina pectoris: Secondary | ICD-10-CM | POA: Diagnosis present

## 2015-04-07 DIAGNOSIS — I779 Disorder of arteries and arterioles, unspecified: Secondary | ICD-10-CM | POA: Diagnosis present

## 2015-04-07 DIAGNOSIS — J40 Bronchitis, not specified as acute or chronic: Secondary | ICD-10-CM | POA: Diagnosis present

## 2015-04-07 DIAGNOSIS — G4700 Insomnia, unspecified: Secondary | ICD-10-CM | POA: Diagnosis present

## 2015-04-07 DIAGNOSIS — I6529 Occlusion and stenosis of unspecified carotid artery: Secondary | ICD-10-CM | POA: Diagnosis present

## 2015-04-07 DIAGNOSIS — I509 Heart failure, unspecified: Secondary | ICD-10-CM | POA: Diagnosis not present

## 2015-04-07 DIAGNOSIS — R0902 Hypoxemia: Secondary | ICD-10-CM | POA: Diagnosis not present

## 2015-04-07 DIAGNOSIS — I272 Other secondary pulmonary hypertension: Secondary | ICD-10-CM | POA: Diagnosis present

## 2015-04-07 DIAGNOSIS — J9621 Acute and chronic respiratory failure with hypoxia: Secondary | ICD-10-CM | POA: Diagnosis present

## 2015-04-07 DIAGNOSIS — B9620 Unspecified Escherichia coli [E. coli] as the cause of diseases classified elsewhere: Secondary | ICD-10-CM | POA: Diagnosis present

## 2015-04-07 DIAGNOSIS — I5033 Acute on chronic diastolic (congestive) heart failure: Secondary | ICD-10-CM | POA: Diagnosis present

## 2015-04-07 DIAGNOSIS — K2270 Barrett's esophagus without dysplasia: Secondary | ICD-10-CM | POA: Diagnosis present

## 2015-04-07 DIAGNOSIS — Z79899 Other long term (current) drug therapy: Secondary | ICD-10-CM

## 2015-04-07 DIAGNOSIS — R0600 Dyspnea, unspecified: Secondary | ICD-10-CM | POA: Diagnosis present

## 2015-04-07 DIAGNOSIS — R05 Cough: Secondary | ICD-10-CM | POA: Diagnosis not present

## 2015-04-07 DIAGNOSIS — I739 Peripheral vascular disease, unspecified: Secondary | ICD-10-CM

## 2015-04-07 LAB — CBC WITH DIFFERENTIAL/PLATELET
BASOS ABS: 0 10*3/uL (ref 0.0–0.1)
Basophils Relative: 0 %
Eosinophils Absolute: 0.1 10*3/uL (ref 0.0–0.7)
Eosinophils Relative: 1 %
HEMATOCRIT: 42.9 % (ref 36.0–46.0)
Hemoglobin: 13.6 g/dL (ref 12.0–15.0)
LYMPHS ABS: 1.3 10*3/uL (ref 0.7–4.0)
LYMPHS PCT: 26 %
MCH: 30.4 pg (ref 26.0–34.0)
MCHC: 31.7 g/dL (ref 30.0–36.0)
MCV: 95.8 fL (ref 78.0–100.0)
MONO ABS: 0.6 10*3/uL (ref 0.1–1.0)
Monocytes Relative: 11 %
NEUTROS ABS: 3.2 10*3/uL (ref 1.7–7.7)
Neutrophils Relative %: 61 %
Platelets: 106 10*3/uL — ABNORMAL LOW (ref 150–400)
RBC: 4.48 MIL/uL (ref 3.87–5.11)
RDW: 15.3 % (ref 11.5–15.5)
WBC: 5.2 10*3/uL (ref 4.0–10.5)

## 2015-04-07 LAB — BASIC METABOLIC PANEL
Anion gap: 7 (ref 5–15)
BUN: 13 mg/dL (ref 6–20)
CHLORIDE: 92 mmol/L — AB (ref 101–111)
CO2: 37 mmol/L — AB (ref 22–32)
Calcium: 8.9 mg/dL (ref 8.9–10.3)
Creatinine, Ser: 0.99 mg/dL (ref 0.44–1.00)
GFR calc Af Amer: 56 mL/min — ABNORMAL LOW (ref >60)
GFR calc non Af Amer: 49 mL/min — ABNORMAL LOW (ref >60)
Glucose, Bld: 107 mg/dL — ABNORMAL HIGH (ref 65–99)
POTASSIUM: 3.5 mmol/L (ref 3.5–5.1)
SODIUM: 136 mmol/L (ref 135–145)

## 2015-04-07 LAB — URINALYSIS, ROUTINE W REFLEX MICROSCOPIC
Bilirubin Urine: NEGATIVE
Glucose, UA: NEGATIVE mg/dL
Ketones, ur: NEGATIVE mg/dL
NITRITE: NEGATIVE
Protein, ur: NEGATIVE mg/dL
SPECIFIC GRAVITY, URINE: 1.012 (ref 1.005–1.030)
pH: 6.5 (ref 5.0–8.0)

## 2015-04-07 LAB — I-STAT TROPONIN, ED: Troponin i, poc: 0.01 ng/mL (ref 0.00–0.08)

## 2015-04-07 LAB — I-STAT CG4 LACTIC ACID, ED: LACTIC ACID, VENOUS: 1 mmol/L (ref 0.5–2.0)

## 2015-04-07 LAB — URINE MICROSCOPIC-ADD ON

## 2015-04-07 LAB — BRAIN NATRIURETIC PEPTIDE: B NATRIURETIC PEPTIDE 5: 314.6 pg/mL — AB (ref 0.0–100.0)

## 2015-04-07 MED ORDER — FUROSEMIDE 10 MG/ML IJ SOLN
20.0000 mg | Freq: Every day | INTRAMUSCULAR | Status: DC
  Administered 2015-04-08 – 2015-04-09 (×2): 20 mg via INTRAVENOUS
  Filled 2015-04-07 (×2): qty 2

## 2015-04-07 MED ORDER — SODIUM CHLORIDE 0.9 % IJ SOLN
3.0000 mL | Freq: Two times a day (BID) | INTRAMUSCULAR | Status: DC
  Administered 2015-04-07 – 2015-04-10 (×5): 3 mL via INTRAVENOUS

## 2015-04-07 MED ORDER — IPRATROPIUM-ALBUTEROL 0.5-2.5 (3) MG/3ML IN SOLN: 3.0000 mL | Freq: Four times a day (QID) | RESPIRATORY_TRACT | Status: DC | PRN

## 2015-04-07 MED ORDER — FUROSEMIDE 10 MG/ML IJ SOLN
40.0000 mg | Freq: Once | INTRAMUSCULAR | Status: AC
  Administered 2015-04-07: 40 mg via INTRAVENOUS
  Filled 2015-04-07: qty 4

## 2015-04-07 MED ORDER — SODIUM CHLORIDE 0.9 % IJ SOLN: 3.0000 mL | INTRAMUSCULAR | Status: DC | PRN

## 2015-04-07 MED ORDER — SODIUM CHLORIDE 0.9 % IV SOLN: 250.0000 mL | INTRAVENOUS | Status: DC | PRN

## 2015-04-07 MED ORDER — DOXYCYCLINE HYCLATE 100 MG PO TABS
100.0000 mg | ORAL_TABLET | Freq: Two times a day (BID) | ORAL | Status: DC
  Administered 2015-04-07 – 2015-04-10 (×6): 100 mg via ORAL
  Filled 2015-04-07 (×6): qty 1

## 2015-04-07 MED ORDER — IPRATROPIUM-ALBUTEROL 0.5-2.5 (3) MG/3ML IN SOLN
3.0000 mL | Freq: Once | RESPIRATORY_TRACT | Status: AC
  Administered 2015-04-07: 3 mL via RESPIRATORY_TRACT
  Filled 2015-04-07: qty 3

## 2015-04-07 MED ORDER — ENOXAPARIN SODIUM 30 MG/0.3ML ~~LOC~~ SOLN
30.0000 mg | SUBCUTANEOUS | Status: DC
  Administered 2015-04-07 – 2015-04-10 (×3): 30 mg via SUBCUTANEOUS
  Filled 2015-04-07 (×3): qty 0.3

## 2015-04-07 MED ORDER — SODIUM CHLORIDE 0.9 % IJ SOLN: 3.0000 mL | Freq: Two times a day (BID) | INTRAMUSCULAR | Status: DC

## 2015-04-07 NOTE — H&P (Signed)
History and Physical    Jasmine [name] ZOX:[id number] DOB: 08/27/1922 DOA: 04/07/2015  Referring physician: Noelle Penner, PA PCP: Nicki Reaper, NP  Specialists: none   Chief Complaint: shortness of breath   HPI: Jasmine [name] is a 81 y.o. female has a past medical history significant for hypertension, hyperlipidemia, chronic atrial fibrillation, diastolic heart failure, hypothyroidism, coronary artery disease, Zestril emergency room with a chief complaint shortness of breath and cough for the past week. She has been progressively short of breath and saw her PCP about 5 days ago, and she was given supportive treatment. Despite that, she did not improve and decided to come to the emergency room. She endorses a fever of 101.3 couple nights ago. At home she was found to be hypoxic requiring supplemental oxygen. She denies any chest pain or palpitations. She denies abdominal pain, nausea, vomiting or diarrhea. She endorses poor by mouth intake. She endorses weight loss over the last year, cannot quantify how much. She is not bringing up any phlegm with coughing. She denies any burning with urination, she denies any blood in her stool or blood in her urine. In the emergency room, chest x-ray showed stable interstitial pulmonary edema with pleural effusions and left lower lobe atelectasis. Blood work is significant for mild thrombocytopenia and an elevated BNP. TRH was asked for admission for acute on chronic hypoxic respiratory failure.  Review of Systems: As per history of present illness, otherwise 10 point review of systems negative  Past Medical History  Diagnosis Date  . Insomnia due to medical condition   . Anxiety   . Hyperlipidemia   . Hypertension   . Hypothyroidism   . Low back pain   . Left wrist fracture 09/2007  . Fracture of right shoulder 02/2008  . GERD (gastroesophageal reflux disease)   . Barrett's esophagus   . Hemorrhoids, external   . CAD (coronary artery disease) of artery  bypass graft 1995  . Hiatal hernia   . History of colon polyps   . Atrial fibrillation with RVR (HCC)   . Syncope    Past Surgical History  Procedure Laterality Date  . Colonoscopy w/ polypectomy  06/2005  . Carotid endarterectomy    . Cataract extraction    . Carotid doppler Bilateral 05/15/2010    Right Bulb/CEA-demonstrated trace irregular nonhemodynamically significant plaque 0-49%. Left Bulb/Proximal ICA-demonstrated irregular nonhemodynamically signifcant plaque 0-49%.  . Cardiac catheterization  02/25/1996    No intervention-recommend CABG. Left main-80% concentric distal stenosis, LAD-99% ostial stenosis, first diag-80% segmental proximal stenosis, Circumflex-80% ostial stenosis, RCA-90% ostial stenosis.  . Cardiac catheterization  05/21/2003    No intervention-widely patent grafts.  . Coronary artery bypass graft  1997  . Lexiscan myoview  04/03/2011    No scintigraphic evidence of inducible myocardial ischemia. No lexiscan EKG changes. Non-diagnostic for ischemia.   . Transthoracic echocardiogram  01/08/2011    EF 50-55%, LA moderately dilated, moderate tricuspid regurg, mild pulmonary hypertension.   Social History:  reports that she quit smoking about 46 years ago. She has never used smokeless tobacco. She reports that she does not drink alcohol or use illicit drugs.  Allergies  Allergen Reactions  . Codeine     unknown  . Meperidine Hcl     unknown  . Oxycodone-Acetaminophen     unknown    Family History  Problem Relation Age of Onset  . Diabetes Son   . Heart disease Mother   . Heart disease Father  Prior to Admission medications   Medication Sig Start Date End Date Taking? Authorizing Provider  acetaminophen (TYLENOL) 500 MG tablet Take 500 mg by mouth every 6 (six) hours as needed for mild pain. For pain   Yes Historical Provider, MD  aspirin 81 MG tablet Take 81 mg by mouth daily.     Yes Historical Provider, MD  atorvastatin (LIPITOR) 80 MG tablet TAKE 1  TABLET BY MOUTH DAILY Patient taking differently: Take 80 mg by mouth daily. TAKE 1 TABLET BY MOUTH DAILY 12/03/14  Yes Lorre Munroe, NP  bisoprolol (ZEBETA) 10 MG tablet Take 1 tablet (10 mg total) by mouth daily. Patient taking differently: Take 5 mg by mouth daily.  01/28/15  Yes Lorre Munroe, NP  Calcium Carbonate-Vitamin D (CALCIUM 600-D) 600-400 MG-UNIT per tablet Take 1 tablet by mouth daily.    Yes Historical Provider, MD  clonazePAM (KLONOPIN) 1 MG tablet TAKE 1/2 TABLET BY MOUTH EVERY MORNING, 1/2 TABLET AT 6PM, AND 1 TABLET BY MOUTH AT BEDTIME 03/19/15  Yes Lorre Munroe, NP  cyanocobalamin 500 MCG tablet Take 500 mcg by mouth daily.     Yes Historical Provider, MD  diltiazem [facility]) 120 MG 24 hr capsule TAKE 1 CAPSULE DAILY 04/01/15  Yes Runell Gess, MD  furosemide (LASIX) 20 MG tablet Take 1 tablet (20 mg total) by mouth daily as needed for fluid or edema. 01/30/15  Yes Lorre Munroe, NP  levothyroxine (SYNTHROID, LEVOTHROID) 88 MCG tablet TAKE 1 TABLET DAILY 12/25/14  Yes Lorre Munroe, NP  lubiprostone (AMITIZA) 24 MCG capsule Take 1 capsule (24 mcg total) by mouth 2 (two) times daily with a meal. Patient taking differently: Take 24 mcg by mouth daily with breakfast.  12/23/12  Yes Stacie Glaze, MD  mirtazapine (REMERON) 45 MG tablet TAKE 1 TABLET AT BEDTIME 04/02/15  Yes Lorre Munroe, NP  Multiple Vitamin (MULTIVITAMIN) capsule Take 1 capsule by mouth daily.     Yes Historical Provider, MD  omeprazole (PRILOSEC) 20 MG capsule TAKE 1 CAPSULE DAILY 03/13/15  Yes Lorre Munroe, NP  QUEtiapine (SEROQUEL) 25 MG tablet Take 1 tablet (25 mg total) by mouth at bedtime. 12/03/14  Yes Lorre Munroe, NP  traMADol (ULTRAM) 50 MG tablet Take 1 tablet (50 mg total) by mouth every 8 (eight) hours as needed. Patient taking differently: Take 50 mg by mouth every 8 (eight) hours as needed for moderate pain.  01/17/15  Yes Lorre Munroe, NP  vitamin E 400 UNIT capsule Take 400 Units by  mouth daily.     Yes Historical Provider, MD   Physical Exam: Filed Vitals:   04/07/15 1700 04/07/15 1730 04/07/15 1745 04/07/15 1800  BP: 134/80 134/78 134/109 137/82  Pulse: 109 92  111  Temp:      TempSrc:      Resp:  Weight:      SpO2: 95% 97%  97%     GENERAL: NAD, pleasant 81 year old female  HEENT: head NCAT, no scleral icterus. Pupils round and reactive. Mucous membranes are moist. Posterior pharynx clear of any exudate or lesions.  NECK: Supple. No carotid bruits.   LUNGS: bibasilar crackles, bibasilar rhonchi, no wheezing   HEART: irregularly irregular, no JVD, no peripheral edema  ABDOMEN: Soft, nontender, and nondistended. Positive bowel sounds.  EXTREMITIES: Without any cyanosis, clubbing, rash, lesions or edema.  NEUROLOGIC: Alert and oriented x3. nonfocal     Labs on Admission:  Basic  Metabolic Panel:  Recent Labs Lab 04/07/15 1706  NA 136  K 3.5  CL 92*  CO2 37*  GLUCOSE 107*  BUN 13  CREATININE 0.99  CALCIUM 8.9   CBC:  Recent Labs Lab 04/07/15 1706  WBC 5.2  NEUTROABS 3.2  HGB 13.6  HCT 42.9  MCV 95.8  PLT 106*   BNP (last 3 results)  Recent Labs  01/22/15 1817 04/07/15 1706  BNP 417.6* 314.6*    ProBNP (last 3 results)  Recent Labs  01/28/15 1648  PROBNP 305.0*   Radiological Exams on Admission: Dg Chest 2 View  04/07/2015  CLINICAL DATA:  Productive cough.  Chest congestion. EXAM: CHEST  2 VIEW COMPARISON:  01/24/2015 and 01/22/2015. FINDINGS: The heart size and mediastinal contours are stable status post CABG. There is aortic atherosclerosis. Surgical clips are present within the right neck. Mild interstitial edema, small pleural effusions and asymmetric left lower lobe atelectasis are similar to the most recent examination. No evidence of consolidation. The bones are demineralized without apparent acute osseous findings. IMPRESSION: Stable interstitial pulmonary edema, pleural effusions and left lower lobe  atelectasis. Electronically Signed   By: Carey BullocksWilliam  Veazey M.D.   On: 04/07/2015 16:49    EKG: Independently reviewed. Atrial fibrillation  Assessment/Plan Active Problems:   Hypothyroidism   HLD (hyperlipidemia)   Anxiety state   INSOMNIA, CHRONIC   CAD - CABG '97, cath OK 2005, Myoview low risk 12/12   HTN (hypertension)   Chronic atrial fibrillation (HCC)   Carotid artery disease (HCC)   CKD (chronic kidney disease), stage III   Thrombocytopenia (HCC)   Dyspnea   Acute on chronic diastolic (congestive) heart failure (HCC)   Pulmonary hypertension (HCC)    Acute on chronic diastolic heart failure resulting in acute on chronic hypoxic respiratory failure - Patient normally uses oxygen at nighttime, was found to have low oxygen saturation with ambulation on room air, by supplemental oxygen as needed - IV Lasix 40 mg 1 in the emergency room, monitor I's and O's, daily weights, IV Lasix 20 mg daily starting tomorrow - She recently underwent a 2-D echo 2 months ago which showed normal systolic function with an EF of 55%, and known pulmonary hypertension  Atrial fibrillation - patient's CHA2DS2-VASc Score for Stroke Risk is 5 - She is on aspirin and rate controlling agents, not on anticoagulation because of high fall risk. She was admitted 2 months ago with fall with cerebral contusion   CKD stage III - Creatinine is currently at baseline  URI / bronchitis  - Patient with a fever of 11.3 at home, chest x-ray without any clear evidence of pneumonia,? Bronchitis, provide empiric doxycycline  Hypertension - Resume home medications   Hyperlipidemia - Resume home medications  Hypothyroidism - Last TSH 2 months ago was normal, continue home Synthroid  Thrombocytopenia - Chronic, stable, no bleeding   Diet: heart healthy Fluids: none DVT Prophylaxis: Lovenox  Code Status: DNR  Family Communication: daughter bedside  Disposition Plan: admit to telemetry   Ebbie Sorenson M.  Elvera LennoxGherghe, MD [facility] Pager [phone]  If 7PM-7AM, please contact night-coverage [email or web address] Password [email or web address] 04/07/2015, 6:42 PM

## 2015-04-07 NOTE — ED Provider Notes (Signed)
CSN: [id number]     Arrival date & time 04/07/15  1518 History   First MD Initiated Contact with Patient 04/07/15 1519     Chief Complaint  Patient presents with  . Cough    HPI   Ms. Jasmine [name] is an 81 y.o. female with history of CHF (echo 01/2015 EF 55-60%), afib (no anticoagulation), HTN, HLD, CAD who presents to the ED for evaluation of cough. She endorses a productive cough for one week. She states she saw urgent care earlier this week with unremarkable workup and d/c home with supportive meds. However, she states she has progressively felt like she is coughing up more and more the past couple of days. She states she feels it is more difficult to cough up sputum. Denies chest pain, SOB. Denies new swelling. States she uses supplemental O2 at night. Denies fever, chills, urinary symptoms, abdominal pain, n/v/d.   Past Medical History  Diagnosis Date  . Insomnia due to medical condition   . Anxiety   . Hyperlipidemia   . Hypertension   . Hypothyroidism   . Low back pain   . Left wrist fracture 09/2007  . Fracture of right shoulder 02/2008  . GERD (gastroesophageal reflux disease)   . Barrett's esophagus   . Hemorrhoids, external   . CAD (coronary artery disease) of artery bypass graft 1995  . Hiatal hernia   . History of colon polyps   . Atrial fibrillation with RVR (HCC)   . Syncope    Past Surgical History  Procedure Laterality Date  . Colonoscopy w/ polypectomy  06/2005  . Carotid endarterectomy    . Cataract extraction    . Carotid doppler Bilateral 05/15/2010    Right Bulb/CEA-demonstrated trace irregular nonhemodynamically significant plaque 0-49%. Left Bulb/Proximal ICA-demonstrated irregular nonhemodynamically signifcant plaque 0-49%.  . Cardiac catheterization  02/25/1996    No intervention-recommend CABG. Left main-80% concentric distal stenosis, LAD-99% ostial stenosis, first diag-80% segmental proximal stenosis, Circumflex-80% ostial stenosis, RCA-90% ostial stenosis.   . Cardiac catheterization  05/21/2003    No intervention-widely patent grafts.  . Coronary artery bypass graft  1997  . Lexiscan myoview  04/03/2011    No scintigraphic evidence of inducible myocardial ischemia. No lexiscan EKG changes. Non-diagnostic for ischemia.   . Transthoracic echocardiogram  01/08/2011    EF 50-55%, LA moderately dilated, moderate tricuspid regurg, mild pulmonary hypertension.   Family History  Problem Relation Age of Onset  . Diabetes Son   . Heart disease Mother   . Heart disease Father    Social History  Substance Use Topics  . Smoking status: Former Smoker    Quit date: 04/20/1968  . Smokeless tobacco: Never Used     Comment: QUIT IN 1970  . Alcohol Use: No     Comment: STOPPED IN 1970   OB History    No data available     Review of Systems  All other systems reviewed and are negative.     Allergies  Codeine; Meperidine hcl; and Oxycodone-acetaminophen  Home Medications   Prior to Admission medications   Medication Sig Start Date End Date Taking? Authorizing Provider  acetaminophen (TYLENOL) 500 MG tablet Take 500 mg by mouth every 6 (six) hours as needed for mild pain. For pain    Historical Provider, MD  aspirin 81 MG tablet Take 81 mg by mouth daily.      Historical Provider, MD  atorvastatin (LIPITOR) 80 MG tablet TAKE 1 TABLET BY MOUTH DAILY 12/03/14   Salvadore Oxfordegina W  Baity, NP  bisoprolol (ZEBETA) 10 MG tablet Take 1 tablet (10 mg total) by mouth daily. 01/28/15   Lorre Munroe, NP  Calcium Carbonate-Vitamin D (CALCIUM 600-D) 600-400 MG-UNIT per tablet Take 1 tablet by mouth daily.     Historical Provider, MD  clonazePAM (KLONOPIN) 1 MG tablet TAKE 1/2 TABLET BY MOUTH EVERY MORNING, 1/2 TABLET AT 6PM, AND 1 TABLET BY MOUTH AT BEDTIME 03/19/15   Lorre Munroe, NP  cyanocobalamin 500 MCG tablet Take 500 mcg by mouth daily.      Historical Provider, MD  diltiazem [facility]) 120 MG 24 hr capsule TAKE 1 CAPSULE DAILY 04/01/15   Runell Gess, MD   furosemide (LASIX) 20 MG tablet Take 1 tablet (20 mg total) by mouth daily as needed for fluid or edema. 01/30/15   Lorre Munroe, NP  levothyroxine (SYNTHROID, LEVOTHROID) 88 MCG tablet TAKE 1 TABLET DAILY 12/25/14   Lorre Munroe, NP  lubiprostone (AMITIZA) 24 MCG capsule Take 1 capsule (24 mcg total) by mouth 2 (two) times daily with a meal. Patient taking differently: Take 24 mcg by mouth daily with breakfast.  12/23/12   Stacie Glaze, MD  mirtazapine (REMERON) 45 MG tablet TAKE 1 TABLET AT BEDTIME 04/02/15   Lorre Munroe, NP  Multiple Vitamin (MULTIVITAMIN) capsule Take 1 capsule by mouth daily.      Historical Provider, MD  omeprazole (PRILOSEC) 20 MG capsule TAKE 1 CAPSULE DAILY 03/13/15   Lorre Munroe, NP  QUEtiapine (SEROQUEL) 25 MG tablet Take 1 tablet (25 mg total) by mouth at bedtime. 12/03/14   Lorre Munroe, NP  traMADol (ULTRAM) 50 MG tablet Take 1 tablet (50 mg total) by mouth every 8 (eight) hours as needed. Patient taking differently: Take 50 mg by mouth every 8 (eight) hours as needed for moderate pain.  01/17/15   Lorre Munroe, NP  vitamin E 400 UNIT capsule Take 400 Units by mouth daily.      Historical Provider, MD   BP 137/82 mmHg  Pulse 111  Temp(Src) 97.9 F (36.6 C) (Oral)  Resp 22  Wt 46.72 kg  SpO2 97% Physical Exam  Constitutional: She is oriented to person, place, and time.  HENT:  Right Ear: External ear normal.  Left Ear: External ear normal.  Nose: Nose normal.  Mouth/Throat: Oropharynx is clear and moist. No oropharyngeal exudate.  Eyes: Conjunctivae and EOM are normal. Pupils are equal, round, and reactive to light.  Neck: Normal range of motion. Neck supple.  Cardiovascular: Normal heart sounds and intact distal pulses.  An irregularly irregular rhythm present.  Pulmonary/Chest: Effort normal. No respiratory distress. She exhibits no tenderness.  Diffuse expiratory wheezing bilaterally. Coarse lung sounds, congestion bilaterally. No increased  WOB.  Abdominal: Soft. Bowel sounds are normal. She exhibits no distension. There is no tenderness. There is no rebound and no guarding.  Musculoskeletal: She exhibits no edema.  Neurological: She is alert and oriented to person, place, and time. No cranial nerve deficit.  Skin: Skin is warm and dry.  Psychiatric: She has a normal mood and affect.  Nursing note and vitals reviewed.     ED Course  Procedures (including critical care time) Labs Review Labs Reviewed  BASIC METABOLIC PANEL - Abnormal; Notable for the following:    Chloride 92 (*)    CO2 37 (*)    Glucose, Bld 107 (*)    GFR calc non Af Amer 49 (*)    GFR calc Af  Amer 56 (*)    All other components within normal limits  BRAIN NATRIURETIC PEPTIDE - Abnormal; Notable for the following:    B Natriuretic Peptide 314.6 (*)    All other components within normal limits  CBC WITH DIFFERENTIAL/PLATELET - Abnormal; Notable for the following:    Platelets 106 (*)    All other components within normal limits  URINALYSIS, ROUTINE W REFLEX MICROSCOPIC (NOT AT [facility]) - Abnormal; Notable for the following:    APPearance CLOUDY (*)    Hgb urine dipstick SMALL (*)    Leukocytes, UA MODERATE (*)    All other components within normal limits  URINE MICROSCOPIC-ADD ON - Abnormal; Notable for the following:    Squamous Epithelial / LPF 0-5 (*)    Bacteria, UA MANY (*)    All other components within normal limits  COMPREHENSIVE METABOLIC PANEL  CBC  I-STAT TROPOININ, ED  I-STAT CG4 LACTIC ACID, ED    Imaging Review Dg Chest 2 View  04/07/2015  CLINICAL DATA:  Productive cough.  Chest congestion. EXAM: CHEST  2 VIEW COMPARISON:  01/24/2015 and 01/22/2015. FINDINGS: The heart size and mediastinal contours are stable status post CABG. There is aortic atherosclerosis. Surgical clips are present within the right neck. Mild interstitial edema, small pleural effusions and asymmetric left lower lobe atelectasis are similar to the most recent  examination. No evidence of consolidation. The bones are demineralized without apparent acute osseous findings. IMPRESSION: Stable interstitial pulmonary edema, pleural effusions and left lower lobe atelectasis. Electronically Signed   By: Carey Bullocks M.D.   On: 04/07/2015 16:49   I have personally reviewed and evaluated these images and lab results as part of my medical decision-making.   EKG Interpretation   Date/Time:  Sunday April 07 2015 17:22:46 EST Ventricular Rate:  95 PR Interval:    QRS Duration: 82 QT Interval:  458 QTC Calculation: 576 R Axis:   174 Text Interpretation:  Atrial fibrillation Right ventricular hypertrophy  Consider anterolateral infarct Prolonged QT interval Atrial fibrillation  Artifact Abnormal ekg Confirmed by Gerhard Munch  MD [phone]) on  04/07/2015 5:31:07 PM      MDM   Final diagnoses:  Acute on chronic congestive heart failure, unspecified congestive heart failure type (HCC)  Hypoxia    Initial CXR shows stable pulmonary edema, pleural effusions, and atelectasis. No new acute findings. Pt had initially been maintaing SpO2 mid 90s on RA. Duoneb given as pt has quite a bit of wheezing, which i suspect is 2/2 viral syndrome. However, SpO2 dropped to high 80s while sitting at room air. Given hypoxia and new oxygen requirement, will expand workup and admit for hypoxia.  Labs unremarkable. BNP 314, otherwise negative. She does not have any new edema, has been taking lasix as prescribed. Will call hospitalist for admission for hypoxia, likely exacerbation of CHF with viral syndrome.    Carlene Coria, PA-C 04/07/15 1940  Melene Plan, DO 04/08/15 1447

## 2015-04-07 NOTE — ED Notes (Signed)
[facility]/o cough for about a week. Stated she feels like she has junk in her lungs that she can't bring up. Saw PCP Tuesday who told her to use Robitussin which has been ineffective. EMS states she was satting in the low 90s on RA, put her on 2L and sats went up to 100%. Last BP 130/80, P 90. No respiratory distress en route. Pt from home with daughter.

## 2015-04-08 ENCOUNTER — Other Ambulatory Visit: Payer: Self-pay | Admitting: Internal Medicine

## 2015-04-08 DIAGNOSIS — F411 Generalized anxiety disorder: Secondary | ICD-10-CM

## 2015-04-08 LAB — COMPREHENSIVE METABOLIC PANEL
ALT: 24 U/L (ref 14–54)
AST: 38 U/L (ref 15–41)
Albumin: 3.1 g/dL — ABNORMAL LOW (ref 3.5–5.0)
Alkaline Phosphatase: 79 U/L (ref 38–126)
Anion gap: 10 (ref 5–15)
BUN: 11 mg/dL (ref 6–20)
CHLORIDE: 90 mmol/L — AB (ref 101–111)
CO2: 37 mmol/L — ABNORMAL HIGH (ref 22–32)
Calcium: 8.5 mg/dL — ABNORMAL LOW (ref 8.9–10.3)
Creatinine, Ser: 1.01 mg/dL — ABNORMAL HIGH (ref 0.44–1.00)
GFR, EST AFRICAN AMERICAN: 55 mL/min — AB (ref >60)
GFR, EST NON AFRICAN AMERICAN: 48 mL/min — AB (ref >60)
Glucose, Bld: 103 mg/dL — ABNORMAL HIGH (ref 65–99)
POTASSIUM: 3.2 mmol/L — AB (ref 3.5–5.1)
Sodium: 137 mmol/L (ref 135–145)
Total Bilirubin: 0.5 mg/dL (ref 0.3–1.2)
Total Protein: 5.9 g/dL — ABNORMAL LOW (ref 6.5–8.1)

## 2015-04-08 LAB — CBC
HEMATOCRIT: 41.8 % (ref 36.0–46.0)
Hemoglobin: 13.1 g/dL (ref 12.0–15.0)
MCH: 29.8 pg (ref 26.0–34.0)
MCHC: 31.3 g/dL (ref 30.0–36.0)
MCV: 95.2 fL (ref 78.0–100.0)
PLATELETS: 117 10*3/uL — AB (ref 150–400)
RBC: 4.39 MIL/uL (ref 3.87–5.11)
RDW: 15.3 % (ref 11.5–15.5)
WBC: 5.7 10*3/uL (ref 4.0–10.5)

## 2015-04-08 MED ORDER — CALCIUM CARBONATE-VITAMIN D 500-200 MG-UNIT PO TABS
1.0000 | ORAL_TABLET | Freq: Every day | ORAL | Status: DC
Start: 2015-04-08 — End: 2015-04-10
  Administered 2015-04-08 – 2015-04-10 (×3): 1 via ORAL
  Filled 2015-04-08 (×5): qty 1

## 2015-04-08 MED ORDER — LEVOTHYROXINE SODIUM 88 MCG PO TABS
88.0000 ug | ORAL_TABLET | Freq: Every day | ORAL | Status: DC
  Administered 2015-04-09 – 2015-04-10 (×2): 88 ug via ORAL
  Filled 2015-04-08 (×2): qty 1

## 2015-04-08 MED ORDER — ATORVASTATIN CALCIUM 80 MG PO TABS
80.0000 mg | ORAL_TABLET | Freq: Every day | ORAL | Status: DC
  Administered 2015-04-08 – 2015-04-10 (×3): 80 mg via ORAL
  Filled 2015-04-08 (×3): qty 1

## 2015-04-08 MED ORDER — LUBIPROSTONE 24 MCG PO CAPS
24.0000 ug | ORAL_CAPSULE | Freq: Every day | ORAL | Status: DC
  Administered 2015-04-09 – 2015-04-10 (×2): 24 ug via ORAL
  Filled 2015-04-08 (×3): qty 1

## 2015-04-08 MED ORDER — CLONAZEPAM 0.5 MG PO TABS: 1.0000 mg | ORAL_TABLET | Freq: Every evening | ORAL | Status: DC | PRN

## 2015-04-08 MED ORDER — ASPIRIN EC 81 MG PO TBEC
81.0000 mg | DELAYED_RELEASE_TABLET | Freq: Every day | ORAL | Status: DC
  Administered 2015-04-08: 81 mg via ORAL
  Filled 2015-04-08 (×2): qty 1

## 2015-04-08 MED ORDER — TRAMADOL HCL 50 MG PO TABS: 50.0000 mg | ORAL_TABLET | Freq: Three times a day (TID) | ORAL | Status: DC | PRN

## 2015-04-08 MED ORDER — POTASSIUM CHLORIDE CRYS ER 20 MEQ PO TBCR
40.0000 meq | EXTENDED_RELEASE_TABLET | Freq: Once | ORAL | Status: AC
  Administered 2015-04-08: 40 meq via ORAL
  Filled 2015-04-08: qty 2

## 2015-04-08 MED ORDER — MIRTAZAPINE 15 MG PO TABS
45.0000 mg | ORAL_TABLET | Freq: Every day | ORAL | Status: DC
  Administered 2015-04-08 – 2015-04-10 (×2): 45 mg via ORAL
  Filled 2015-04-08 (×2): qty 3

## 2015-04-08 MED ORDER — PANTOPRAZOLE SODIUM 40 MG PO TBEC
40.0000 mg | DELAYED_RELEASE_TABLET | Freq: Every day | ORAL | Status: DC
  Administered 2015-04-08 – 2015-04-10 (×3): 40 mg via ORAL
  Filled 2015-04-08 (×3): qty 1

## 2015-04-08 MED ORDER — BISOPROLOL FUMARATE 5 MG PO TABS
5.0000 mg | ORAL_TABLET | Freq: Every day | ORAL | Status: DC
  Administered 2015-04-08 – 2015-04-10 (×3): 5 mg via ORAL
  Filled 2015-04-08 (×3): qty 1

## 2015-04-08 MED ORDER — VITAMIN E 180 MG (400 UNIT) PO CAPS
400.0000 [IU] | ORAL_CAPSULE | Freq: Every day | ORAL | Status: DC
  Administered 2015-04-08 – 2015-04-10 (×3): 400 [IU] via ORAL
  Filled 2015-04-08 (×3): qty 1

## 2015-04-08 MED ORDER — QUETIAPINE FUMARATE 25 MG PO TABS
25.0000 mg | ORAL_TABLET | Freq: Every day | ORAL | Status: DC
  Administered 2015-04-08 – 2015-04-10 (×2): 25 mg via ORAL
  Filled 2015-04-08 (×2): qty 1

## 2015-04-08 MED ORDER — ADULT MULTIVITAMIN W/MINERALS CH
1.0000 | ORAL_TABLET | Freq: Every day | ORAL | Status: DC
  Administered 2015-04-08 – 2015-04-09 (×2): 1 via ORAL
  Filled 2015-04-08 (×2): qty 1

## 2015-04-08 MED ORDER — ACETAMINOPHEN 500 MG PO TABS: 500.0000 mg | ORAL_TABLET | Freq: Four times a day (QID) | ORAL | Status: DC | PRN

## 2015-04-08 MED ORDER — VITAMIN B-12 1000 MCG PO TABS
500.0000 ug | ORAL_TABLET | Freq: Every day | ORAL | Status: DC
  Administered 2015-04-08 – 2015-04-10 (×3): 500 ug via ORAL
  Filled 2015-04-08 (×3): qty 1

## 2015-04-08 MED ORDER — DILTIAZEM HCL ER BEADS 120 MG PO CP24
120.0000 mg | ORAL_CAPSULE | Freq: Every day | ORAL | Status: DC
  Administered 2015-04-08 – 2015-04-09 (×2): 120 mg via ORAL
  Filled 2015-04-08 (×6): qty 1

## 2015-04-08 NOTE — Progress Notes (Signed)
PROGRESS NOTE  Jasmine [name] FAO:[id number] DOB: 07/23/1922 DOA: 04/07/2015 PCP: Nicki Reaper, NP   HPI: 81 y.o. female has a past medical history significant for hypertension, hyperlipidemia, chronic atrial fibrillation, diastolic heart failure, hypothyroidism, coronary artery disease, admitted with dyspnea  Subjective / 24 H Interval events - she is tired this morning, just finished working with PT - breathing "about the same"  Assessment/Plan: Active Problems:   Hypothyroidism   HLD (hyperlipidemia)   Anxiety state   INSOMNIA, CHRONIC   CAD - CABG '97, cath OK 2005, Myoview low risk 12/12   HTN (hypertension)   Chronic atrial fibrillation (HCC)   Carotid artery disease (HCC)   CKD (chronic kidney disease), stage III   Thrombocytopenia (HCC)   Dyspnea   Acute on chronic diastolic (congestive) heart failure (HCC)   Pulmonary hypertension (HCC)   Acute on chronic diastolic heart failure resulting in acute on chronic hypoxic respiratory failure - Patient normally uses oxygen at nighttime, was found to have low oxygen saturation with ambulation on room air, by supplemental oxygen as needed - IV Lasix 40 mg 1 in the emergency room, monitor I's and O's, daily weights - She recently underwent a 2-D echo 2 months ago which showed normal systolic function with an EF of 55%, and known pulmonary hypertension - continue diuresis with IV lasix today  Atrial fibrillation - patient's CHA2DS2-VASc Score for Stroke Risk is 5 - She is on aspirin and rate controlling agents, not on anticoagulation because of high fall risk. She was admitted 2 months ago with fall with cerebral contusion  - rate controlled on telemetry   CKD stage III - Creatinine slightly higher due to lasix this morning, stable, continue Lasix and repeat BMP in am   URI / bronchitis  - Patient with a fever of 101.3 at home, chest x-ray without any clear evidence of pneumonia,? Bronchitis, provide empiric  doxycycline - afebrile overnight  Hypertension - Resume home medications   Hyperlipidemia - Resume home medications  Hypothyroidism - Last TSH 2 months ago was normal, continue home Synthroid  Thrombocytopenia - Chronic, stable, no bleeding   Diet: Diet Heart Room service appropriate?: Yes; Fluid consistency:: Thin Fluids: none  DVT Prophylaxis: Lovenox  Code Status: DNR Family Communication: no family bedside  Disposition Plan: home when ready, 1-2 days based on respiratory status  Barriers to discharge: diuresis   Consultants:  None   Procedures:  None    Antibiotics  Anti-infectives    Start     Dose/Rate Route Frequency Ordered Stop   04/07/15 2200  doxycycline (VIBRA-TABS) tablet 100 mg     100 mg Oral Every 12 hours 04/07/15 1859         Studies  Dg Chest 2 View  04/07/2015  CLINICAL DATA:  Productive cough.  Chest congestion. EXAM: CHEST  2 VIEW COMPARISON:  01/24/2015 and 01/22/2015. FINDINGS: The heart size and mediastinal contours are stable status post CABG. There is aortic atherosclerosis. Surgical clips are present within the right neck. Mild interstitial edema, small pleural effusions and asymmetric left lower lobe atelectasis are similar to the most recent examination. No evidence of consolidation. The bones are demineralized without apparent acute osseous findings. IMPRESSION: Stable interstitial pulmonary edema, pleural effusions and left lower lobe atelectasis. Electronically Signed   By: Carey Bullocks M.D.   On: 04/07/2015 16:49    Objective  Filed Vitals:   04/08/15 0425 04/08/15 0742 04/08/15 1040 04/08/15 1230  BP: 128/88 140/56 124/83 132/90  Pulse: 87 90  119  Temp: 98 F (36.7 C) 98.3 F (36.8 C) 98.2 F (36.8 C)   TempSrc: Oral Oral    Resp: 17 18  18   Height:      Weight: 42.021 kg (92 lb 10.2 oz)     SpO2: 95% 96%  98%    Intake/Output Summary (Last 24 hours) at 04/08/15 1420 Last data filed at 04/08/15 1408  Gross  per 24 hour  Intake    655 ml  Output    775 ml  Net   -120 ml   Filed Weights   04/07/15 1526 04/07/15 2011 04/08/15 0425  Weight: 46.72 kg (103 lb) 42.139 kg (92 lb 14.4 oz) 42.021 kg (92 lb 10.2 oz)    Exam:  GENERAL: NAD, frail appearing  HEENT: no scleral icterus, PERRL  NECK: supple, no LAD  LUNGS: bilateral rhonchi at the bases  HEART: irregular  ABDOMEN: soft, non tender  EXTREMITIES: no clubbing / cyanosis  NEUROLOGIC: non focal  Data Reviewed: Basic Metabolic Panel:  Recent Labs Lab 04/07/15 1706 04/08/15 0336  NA 136 137  K 3.5 3.2*  CL 92* 90*  CO2 37* 37*  GLUCOSE 107* 103*  BUN 13 11  CREATININE 0.99 1.01*  CALCIUM 8.9 8.5*   Liver Function Tests:  Recent Labs Lab 04/08/15 0336  AST 38  ALT 24  ALKPHOS 79  BILITOT 0.5  PROT 5.9*  ALBUMIN 3.1*   CBC:  Recent Labs Lab 04/07/15 1706 04/08/15 0336  WBC 5.2 5.7  NEUTROABS 3.2  --   HGB 13.6 13.1  HCT 42.9 41.8  MCV 95.8 95.2  PLT 106* 117*   BNP (last 3 results)  Recent Labs  01/22/15 1817 04/07/15 1706  BNP 417.6* 314.6*    ProBNP (last 3 results)  Recent Labs  01/28/15 1648  PROBNP 305.0*    Scheduled Meds: . aspirin EC  81 mg Oral Daily  . atorvastatin  80 mg Oral Daily  . bisoprolol  5 mg Oral Daily  . calcium-vitamin D  1 tablet Oral Daily  . diltiazem  120 mg Oral Daily  . doxycycline  100 mg Oral Q12H  . enoxaparin (LOVENOX) injection  30 mg Subcutaneous Q24H  . furosemide  20 mg Intravenous Daily  . [START ON 04/09/2015] levothyroxine  88 mcg Oral QAC breakfast  . [START ON 04/09/2015] lubiprostone  24 mcg Oral Q breakfast  . mirtazapine  45 mg Oral QHS  . multivitamin with minerals  1 tablet Oral Q lunch  . pantoprazole  40 mg Oral Daily  . QUEtiapine  25 mg Oral QHS  . sodium chloride  3 mL Intravenous Q12H  . cyanocobalamin  500 mcg Oral Daily  . vitamin E  400 Units Oral Daily   Continuous Infusions:     Pamella Pertostin Sharmain Lastra, MD [facility] Pager [phone]. If 7 PM - 7 AM, please contact night-coverage at [email or web address], password [email or web address] 04/08/2015, 2:20 PM  LOS: 1 day

## 2015-04-08 NOTE — Evaluation (Addendum)
Physical Therapy Evaluation Patient Details Name: Jasmine [name] MRN: [id number] DOB: April 13, 1923 Today's Date: 04/08/2015   History of Present Illness  81 yo female with onset of SOB, found to be in acute respir failure, bronchitis, LLL atelectasis.  PMHx:  CABG, a-fib  Clinical Impression  Pt was assessed and could walk short trip with assistance and noted her gait was unsteady and runs into obstacles on R side with walker.  Pt does not report vision issues and may be her mild lethargy contributing.  Will follow along with her and for now due to help at home will expect her to go directly home from hospital.    Follow Up Recommendations Home health PT;Supervision/Assistance - 24 hour    Equipment Recommendations  None recommended by PT (await SNF disposition)    Recommendations for Other Services Rehab consult     Precautions / Restrictions Precautions Precautions: Fall (pulses) Restrictions Weight Bearing Restrictions: No      Mobility  Bed Mobility Overal bed mobility: Needs Assistance Bed Mobility: Supine to Sit;Sit to Supine     Supine to sit: Mod assist     General bed mobility comments: Pt needed some help to assist scooting out to EOB  Transfers Overall transfer level: Needs assistance Equipment used: Rolling walker (2 wheeled);1 person hand held assist Transfers: Sit to/from [employer] CorporationStand;Stand Pivot Transfers (none due to pulses up to 139 and lethargic from pain meds) Sit to Stand: Min assist;Mod assist Stand pivot transfers: Min assist       General transfer comment: stood with RW and needed help to steady initially  Ambulation/Gait Ambulation/Gait assistance: Min assist Ambulation Distance (Feet): 150 Feet Assistive device: Rolling walker (2 wheeled);1 person hand held assist Gait Pattern/deviations: Step-through pattern;Wide base of support;Drifts right/left;Shuffle Gait velocity: reduced Gait velocity interpretation: Below normal speed for  age/gender General Gait Details: reduced pace with tendency to hit to R side obstacles and cannot control walker well  Stairs            Wheelchair Mobility    Modified Rankin (Stroke Patients Only)       Balance Overall balance assessment: Needs assistance Sitting-balance support: Feet supported Sitting balance-Leahy Scale: Fair                                       Pertinent Vitals/Pain Pain Assessment: No/denies pain    Home Living Family/patient expects to be discharged to:: Private residence Living Arrangements: Children Available Help at Discharge: Family;Available PRN/intermittently (Nearly all the time per pt) Type of Home: House Home Access: Stairs to enter Entrance Stairs-Rails: Right;Left Entrance Stairs-Number of Steps: 3 Home Layout: One level Home Equipment: Bedside commode;Shower seat;Walker - 2 wheels      Prior Function Level of Independence: Independent with assistive device(s)         Comments: uses RW     Hand Dominance        Extremity/Trunk Assessment   Upper Extremity Assessment: Generalized weakness           Lower Extremity Assessment: Generalized weakness      Cervical / Trunk Assessment: Normal  Communication   Communication: No difficulties  Cognition Arousal/Alertness: Lethargic Behavior During Therapy: WFL for tasks assessed/performed Overall Cognitive Status: No family/caregiver present to determine baseline cognitive functioning                      General Comments  General comments (skin integrity, edema, etc.): Pt was able to maintain O2 sats with O2 via nasal cannula with IV, has sat intially 98% and now 93% after walking, pulses 95 to 102    Exercises        Assessment/Plan    PT Assessment Patient needs continued PT services  PT Diagnosis Generalized weakness;Altered mental status   PT Problem List Decreased strength;Decreased range of motion;Decreased balance;Decreased  activity tolerance;Decreased mobility;Decreased coordination;Decreased safety awareness;Cardiopulmonary status limiting activity;Obesity;Decreased skin integrity  PT Treatment Interventions DME instruction;Gait training;Functional mobility training;Therapeutic activities;Balance training;Therapeutic exercise;Neuromuscular re-education;Patient/family education   PT Goals (Current goals can be found in the Care Plan section) Acute Rehab PT Goals Patient Stated Goal: none stated  PT Goal Formulation: With patient Time For Goal Achievement: 04/22/15 Potential to Achieve Goals: Good    Frequency Min 2X/week   Barriers to discharge Other (comment) (needs 2 person assist to try to stand which she cannot do) physical and mental lethargy    Co-evaluation               End of Session Equipment Utilized During Treatment: Oxygen;Gait belt Activity Tolerance: Patient tolerated treatment well;Patient limited by fatigue Patient left: in bed;with call bell/phone within reach;in chair Nurse Communication: Mobility status         Time: 2956-2130 PT Time Calculation (min) (ACUTE ONLY): 30 min   Charges:   PT Evaluation $Initial PT Evaluation Tier I: 1 Procedure PT Treatments $Therapeutic Activity: 8-22 mins   PT G Codes:        Ivar Drape 04-20-15, 11:11 AM   Samul Dada, PT MS Acute Rehab Dept. Number: [facility] [id number] and [facility] [phone]

## 2015-04-08 NOTE — Progress Notes (Signed)
Pt a/o, forgetful, no c/o pain, VSS, pt stable

## 2015-04-08 NOTE — Plan of Care (Signed)
Problem: Acute Rehab PT Goals(only PT should resolve) Goal: Pt Will Ambulate And close guard of chair   

## 2015-04-09 LAB — BASIC METABOLIC PANEL
Anion gap: 10 (ref 5–15)
BUN: 12 mg/dL (ref 6–20)
CALCIUM: 8.9 mg/dL (ref 8.9–10.3)
CO2: 35 mmol/L — AB (ref 22–32)
Chloride: 93 mmol/L — ABNORMAL LOW (ref 101–111)
Creatinine, Ser: 1.02 mg/dL — ABNORMAL HIGH (ref 0.44–1.00)
GFR calc Af Amer: 54 mL/min — ABNORMAL LOW (ref >60)
GFR, EST NON AFRICAN AMERICAN: 47 mL/min — AB (ref >60)
GLUCOSE: 101 mg/dL — AB (ref 65–99)
Potassium: 4.1 mmol/L (ref 3.5–5.1)
Sodium: 138 mmol/L (ref 135–145)

## 2015-04-09 MED ORDER — FUROSEMIDE 10 MG/ML IJ SOLN: 40.0000 mg | Freq: Every day | INTRAMUSCULAR | Status: DC

## 2015-04-09 MED ORDER — ASPIRIN EC 81 MG PO TBEC
81.0000 mg | DELAYED_RELEASE_TABLET | Freq: Every day | ORAL | Status: DC
  Administered 2015-04-09 – 2015-04-10 (×2): 81 mg via ORAL
  Filled 2015-04-09 (×2): qty 1

## 2015-04-09 MED ORDER — FUROSEMIDE 10 MG/ML IJ SOLN
20.0000 mg | Freq: Once | INTRAMUSCULAR | Status: AC
  Administered 2015-04-09: 20 mg via INTRAVENOUS
  Filled 2015-04-09: qty 2

## 2015-04-09 NOTE — Progress Notes (Signed)
SATURATION QUALIFICATIONS: (This note is used to comply with regulatory documentation for home oxygen)  Patient Saturations on Room Air at Rest = 96%  Patient Saturations on Room Air while Ambulating = 70%  Patient Saturations on 2 Liters of oxygen while Ambulating = 88%  Please briefly explain why patient needs home oxygen:  Pt sat dropped below 90%

## 2015-04-09 NOTE — Progress Notes (Addendum)
PROGRESS NOTE  Jasmine [name] ZOX:[id number] DOB: March 03, 1923 DOA: 04/07/2015 PCP: Nicki Reaper, NP   HPI: 81 y.o. female has a past medical history significant for hypertension, hyperlipidemia, chronic atrial fibrillation, diastolic heart failure, hypothyroidism, coronary artery disease, admitted with dyspnea  Subjective / 24 H Interval events - she feels well, breathing "about the same"  Assessment/Plan: Active Problems:   Hypothyroidism   HLD (hyperlipidemia)   Anxiety state   INSOMNIA, CHRONIC   CAD - CABG '97, cath OK 2005, Myoview low risk 12/12   HTN (hypertension)   Chronic atrial fibrillation (HCC)   Carotid artery disease (HCC)   CKD (chronic kidney disease), stage III   Thrombocytopenia (HCC)   Dyspnea   Acute on chronic diastolic (congestive) heart failure (HCC)   Pulmonary hypertension (HCC)   Acute on chronic diastolic heart failure resulting in acute on chronic hypoxic respiratory failure - Patient normally uses oxygen at nighttime, was found to have low oxygen saturation with ambulation on room air, by supplemental oxygen as needed - IV Lasix 40 mg 1 in the emergency room, monitor I's and O's, daily weights - She recently underwent a 2-D echo 2 months ago which showed normal systolic function with an EF of 55%, and known pulmonary hypertension - continue diuresis with IV lasix today - ambulate again in am, consider d/c home if oxygenation improved  Atrial fibrillation - patient's CHA2DS2-VASc Score for Stroke Risk is 5 - She is on aspirin and rate controlling agents, not on anticoagulation because of high fall risk. She was admitted 2 months ago with fall with cerebral contusion  - rate controlled on telemetry   CKD stage III - Creatinine slightly higher due to lasix this morning, stable, continue Lasix and repeat BMP in am   URI / bronchitis  - Patient with a fever of 101.3 at home, chest x-ray without any clear evidence of pneumonia,? Bronchitis,  provide empiric doxycycline - afebrile overnight  Hypertension - Resume home medications   Hyperlipidemia - Resume home medications  Hypothyroidism - Last TSH 2 months ago was normal, continue home Synthroid  Thrombocytopenia - Chronic, stable, no bleeding   Diet: Diet Heart Room service appropriate?: Yes; Fluid consistency:: Thin Fluids: none  DVT Prophylaxis: Lovenox  Code Status: DNR Family Communication: d/w daughter bedside Disposition Plan: home when ready, 1-2 days based on respiratory status  Barriers to discharge: diuresis   Consultants:  None   Procedures:  None    Antibiotics  Anti-infectives    Start     Dose/Rate Route Frequency Ordered Stop   04/07/15 2200  doxycycline (VIBRA-TABS) tablet 100 mg     100 mg Oral Every 12 hours 04/07/15 1859         Studies  Dg Chest 2 View  04/07/2015  CLINICAL DATA:  Productive cough.  Chest congestion. EXAM: CHEST  2 VIEW COMPARISON:  01/24/2015 and 01/22/2015. FINDINGS: The heart size and mediastinal contours are stable status post CABG. There is aortic atherosclerosis. Surgical clips are present within the right neck. Mild interstitial edema, small pleural effusions and asymmetric left lower lobe atelectasis are similar to the most recent examination. No evidence of consolidation. The bones are demineralized without apparent acute osseous findings. IMPRESSION: Stable interstitial pulmonary edema, pleural effusions and left lower lobe atelectasis. Electronically Signed   By: Carey Bullocks M.D.   On: 04/07/2015 16:49    Objective  Filed Vitals:   04/08/15 2039 04/09/15 0405 04/09/15 1151 04/09/15 1217  BP: 147/83 104/53 119/69  132/69  Pulse: 94 79  82  Temp: 98.4 F (36.9 C) 97.5 F (36.4 C)  97.4 F (36.3 C)  TempSrc: Oral Oral  Oral  Resp: 18 18    Height:      Weight:  41.822 kg (92 lb 3.2 oz)    SpO2: 94% 95%  100%    Intake/Output Summary (Last 24 hours) at 04/09/15 1435 Last data filed at  04/09/15 1341  Gross per 24 hour  Intake    350 ml  Output    402 ml  Net    -52 ml   Filed Weights   04/07/15 2011 04/08/15 0425 04/09/15 0405  Weight: 42.139 kg (92 lb 14.4 oz) 42.021 kg (92 lb 10.2 oz) 41.822 kg (92 lb 3.2 oz)    Exam:  GENERAL: NAD, frail appearing  HEENT: no scleral icterus, PERRL  NECK: supple, no LAD  LUNGS: bilateral rhonchi at the bases  HEART: irregular  ABDOMEN: soft, non tender  EXTREMITIES: no clubbing / cyanosis  NEUROLOGIC: non focal  Data Reviewed: Basic Metabolic Panel:  Recent Labs Lab 04/07/15 1706 04/08/15 0336 04/09/15 0800  NA 136 137 138  K 3.5 3.2* 4.1  CL 92* 90* 93*  CO2 37* 37* 35*  GLUCOSE 107* 103* 101*  BUN 13 11 12   CREATININE 0.99 1.01* 1.02*  CALCIUM 8.9 8.5* 8.9   Liver Function Tests:  Recent Labs Lab 04/08/15 0336  AST 38  ALT 24  ALKPHOS 79  BILITOT 0.5  PROT 5.9*  ALBUMIN 3.1*   CBC:  Recent Labs Lab 04/07/15 1706 04/08/15 0336  WBC 5.2 5.7  NEUTROABS 3.2  --   HGB 13.6 13.1  HCT 42.9 41.8  MCV 95.8 95.2  PLT 106* 117*   BNP (last 3 results)  Recent Labs  01/22/15 1817 04/07/15 1706  BNP 417.6* 314.6*    ProBNP (last 3 results)  Recent Labs  01/28/15 1648  PROBNP 305.0*    Scheduled Meds: . aspirin EC  81 mg Oral Daily  . atorvastatin  80 mg Oral Daily  . bisoprolol  5 mg Oral Daily  . calcium-vitamin D  1 tablet Oral Daily  . diltiazem  120 mg Oral Daily  . doxycycline  100 mg Oral Q12H  . enoxaparin (LOVENOX) injection  30 mg Subcutaneous Q24H  . furosemide  20 mg Intravenous Daily  . levothyroxine  88 mcg Oral QAC breakfast  . lubiprostone  24 mcg Oral Q breakfast  . mirtazapine  45 mg Oral QHS  . multivitamin with minerals  1 tablet Oral Q lunch  . pantoprazole  40 mg Oral Daily  . QUEtiapine  25 mg Oral QHS  . sodium chloride  3 mL Intravenous Q12H  . cyanocobalamin  500 mcg Oral Daily  . vitamin E  400 Units Oral Daily   Continuous Infusions:      Pamella Pertostin Maytal Mijangos, MD [facility] Pager [phone]. If 7 PM - 7 AM, please contact night-coverage at [email or web address], password [email or web address] 04/09/2015, 2:35 PM  LOS: 2 days

## 2015-04-09 NOTE — Progress Notes (Signed)
Utilization review completed. Rejina Odle, RN, BSN. 

## 2015-04-10 ENCOUNTER — Inpatient Hospital Stay (HOSPITAL_COMMUNITY): Payer: Medicare Other

## 2015-04-10 LAB — BASIC METABOLIC PANEL
ANION GAP: 10 (ref 5–15)
BUN: 15 mg/dL (ref 6–20)
CALCIUM: 8.7 mg/dL — AB (ref 8.9–10.3)
CO2: 37 mmol/L — AB (ref 22–32)
Chloride: 91 mmol/L — ABNORMAL LOW (ref 101–111)
Creatinine, Ser: 0.9 mg/dL (ref 0.44–1.00)
GFR calc Af Amer: >60 mL/min (ref >60)
GFR calc non Af Amer: 55 mL/min — ABNORMAL LOW (ref >60)
GLUCOSE: 129 mg/dL — AB (ref 65–99)
Potassium: 3.3 mmol/L — ABNORMAL LOW (ref 3.5–5.1)
Sodium: 138 mmol/L (ref 135–145)

## 2015-04-10 LAB — URINE CULTURE

## 2015-04-10 MED ORDER — BISOPROLOL FUMARATE 5 MG PO TABS: 5.0000 mg | ORAL_TABLET | Freq: Every day | ORAL | Status: DC

## 2015-04-10 MED ORDER — SULFAMETHOXAZOLE-TRIMETHOPRIM 800-160 MG PO TABS: 1.0000 | ORAL_TABLET | Freq: Two times a day (BID) | ORAL | Status: DC

## 2015-04-10 MED ORDER — CLONAZEPAM 1 MG PO TABS: 1.0000 mg | ORAL_TABLET | Freq: Every evening | ORAL | Status: DC | PRN

## 2015-04-10 MED ORDER — POTASSIUM CHLORIDE CRYS ER 20 MEQ PO TBCR: 40.0000 meq | EXTENDED_RELEASE_TABLET | Freq: Once | ORAL | Status: DC

## 2015-04-10 MED ORDER — FUROSEMIDE 20 MG PO TABS
20.0000 mg | ORAL_TABLET | Freq: Every day | ORAL | Status: DC
  Administered 2015-04-10: 20 mg via ORAL
  Filled 2015-04-10: qty 1

## 2015-04-10 MED ORDER — POTASSIUM CHLORIDE CRYS ER 20 MEQ PO TBCR
40.0000 meq | EXTENDED_RELEASE_TABLET | Freq: Once | ORAL | Status: AC
  Administered 2015-04-10: 40 meq via ORAL
  Filled 2015-04-10: qty 2

## 2015-04-10 MED ORDER — IPRATROPIUM-ALBUTEROL 0.5-2.5 (3) MG/3ML IN SOLN: 3.0000 mL | Freq: Four times a day (QID) | RESPIRATORY_TRACT | Status: AC | PRN

## 2015-04-10 MED ORDER — FUROSEMIDE 20 MG PO TABS: 20.0000 mg | ORAL_TABLET | Freq: Every day | ORAL | Status: DC

## 2015-04-10 MED ORDER — ACETAMINOPHEN 500 MG PO TABS: 500.0000 mg | ORAL_TABLET | Freq: Four times a day (QID) | ORAL | Status: AC | PRN

## 2015-04-10 MED ORDER — DOXYCYCLINE HYCLATE 100 MG PO TABS: 100.0000 mg | ORAL_TABLET | Freq: Two times a day (BID) | ORAL | Status: DC

## 2015-04-10 NOTE — Progress Notes (Signed)
Nebulizer machine ordered for home as requested. To be delivered to room today prior to discharging home. Abelino DerrickB Ariaunna Longsworth [facility] HospitalRN,MHA,BSN [phone]

## 2015-04-10 NOTE — Consult Note (Signed)
[facility] CM Inpatient Consult   04/10/2015  Jasmine [name] 08/02/1922 [id number] Referral received to assess for care management services. Explained that [facility] is a covered benefit of insurance.   Met with the patient regarding the benefits of [facility] Care Management services. Review information for [facility] and a folder was provided with contact information.  Explained that [facility] does not interfere with or replace any services arranged by the inpatient care management staff.  Patient states she would like to check out the services with [facility] with her daughter [name].  [name] entered patient's room and she states that there has been mentioned her mom needs 24 hour care and she is not sure of her discharge plan currently.  She states she doesn't know how the 24 hours/7days a week is going to work.   Brochure and contact information given. For questions, please contact:   , RN BSN CCM [facility] Liaison  [phone] business mobile phone   

## 2015-04-10 NOTE — Care Management Note (Signed)
Case Management Note  Patient Details  Name: Jasmine [name] MRN: [id number] Date of Birth: 03-08-1923  Subjective/Objective:    Admitted with sob, CHF                Action/Plan: CM talked to [name] [facility]. [name], she has home [id number] and daughter know that she needs to wear it 24 hr daily. [name]. Patient has a walker at home and no other DME is needed at this time.   Expected Discharge Date:  04/10/2015              Expected Discharge Plan:  Home w Home Health Services  Discharge planning Services  CM Consult   Choice offered to:  Adult Children  HH Arranged:  RN, Disease Management, PT HH Agency:  [facility]  Status of Service:  In process, will continue to follow  Reola MosherChandler, Urvi Imes L, RN,MHA,BSN [phone] 04/10/2015, 11:21 AM

## 2015-04-10 NOTE — Progress Notes (Signed)
Physical Therapy Treatment Patient Details Name: Jasmine [name] MRN: [id number] DOB: 10/14/1922 Today's Date: 04/10/2015    History of Present Illness 81 yo female with onset of SOB, found to be in acute respir failure, CHF, bronchitis, LLL atelectasis.  PMHx:  CABG, a-fib    PT Comments    Pt ambulated 160' with RW and supervision, SaO2 88% on 2L O2 with walking. Performed seated BLE exercises. Pt ready to DC home from PT standpoint.   Follow Up Recommendations  Home health PT;Supervision/Assistance - 24 hour     Equipment Recommendations  None recommended by PT (await SNF disposition)    Recommendations for Other Services Rehab consult     Precautions / Restrictions Precautions Precautions: Fall (pulses) Precaution Comments: monitor O2 Restrictions Weight Bearing Restrictions: No    Mobility  Bed Mobility Overal bed mobility: Modified Independent Bed Mobility: Supine to Sit;Sit to Supine     Supine to sit: Modified independent (Device/Increase time);HOB elevated     General bed mobility comments: HOB up 30%, no physical assist needed  Transfers Overall transfer level: Needs assistance Equipment used: Rolling walker (2 wheeled) Transfers: Sit to/from Stand Sit to Stand: Min guard         General transfer comment: verbal cues for hand placement, min/guard for safety/balance, initial mild posterior lean but pt able to self correct  Ambulation/Gait Ambulation/Gait assistance: Supervision Ambulation Distance (Feet): 160 Feet Assistive device: Rolling walker (2 wheeled) Gait Pattern/deviations: Step-through pattern;Decreased step length - right;Decreased step length - left Gait velocity: reduced Gait velocity interpretation: at or above normal speed for age/gender General Gait Details: steady with walker, pt able to negotiate obstacles well, no LOB, SaO2 88% on 2L O2 walking; verbal cues for pursed lip breathing   Stairs            Wheelchair Mobility    Modified Rankin (Stroke Patients Only)       Balance     Sitting balance-Leahy Scale: Good                              Cognition Arousal/Alertness: Awake/alert Behavior During Therapy: WFL for tasks assessed/performed Overall Cognitive Status: Within Functional Limits for tasks assessed                      Exercises General Exercises - Lower Extremity Ankle Circles/Pumps: AROM;Both;10 reps;Seated Long Arc Quad: AROM;Both;10 reps;Seated Hip Flexion/Marching: AROM;Both;10 reps;Seated    General Comments        Pertinent Vitals/Pain Pain Assessment: No/denies pain    Home Living                      Prior Function            PT Goals (current goals can now be found in the care plan section) Acute Rehab PT Goals Patient Stated Goal: none stated  PT Goal Formulation: With patient Time For Goal Achievement: 04/22/15 Potential to Achieve Goals: Good Progress towards PT goals: Goals met and updated - see care plan    Frequency  Min 2X/week    PT Plan Current plan remains appropriate    Co-evaluation             End of Session Equipment Utilized During Treatment: Oxygen;Gait belt Activity Tolerance: Patient tolerated treatment well Patient left: with call bell/phone within reach;in chair     Time: 0927-3310 PT Time Calculation (min) (ACUTE ONLY):  21 min  Charges:  $Gait Training: 8-22 mins                    G Codes:      Philomena Doheny 04/10/2015, 11:55 AM [phone]

## 2015-04-10 NOTE — Discharge Summary (Signed)
Physician Discharge Summary  Jasmine [name] UUV:[id number] DOB: 1922/09/27 DOA: 04/07/2015  PCP: Webb Silversmith, NP  Admit date: 04/07/2015 Discharge date: 04/10/2015  Time spent: 35 minutes  Recommendations for Outpatient Follow-up:  Needs B-met to follow K level and renal function.  Follow up on weight and oxygen requirement. Adjust lasix as needed.   Discharge Diagnoses:    Acute on chronic diastolic (congestive) heart failure (HCC)   Bronchitis   Hypothyroidism   HLD (hyperlipidemia)   Anxiety state   INSOMNIA, CHRONIC   CAD - CABG '97, cath OK 2005, Myoview low risk 12/12   HTN (hypertension)   Chronic atrial fibrillation (HCC)   Carotid artery disease (HCC)   CKD (chronic kidney disease), stage III   Thrombocytopenia (HCC)   Dyspnea   Pulmonary hypertension ([place])   Discharge Condition: stable.   Diet recommendation: Heart Healthy  Filed Weights   04/08/15 0425 04/09/15 0405 04/10/15 0654  Weight: 42.021 kg (92 lb 10.2 oz) 41.822 kg (92 lb 3.2 oz) 41 kg (90 lb 6.2 oz)    History of present illness:  Jasmine [name] is a 81 y.o. female has a past medical history significant for hypertension, hyperlipidemia, chronic atrial fibrillation, diastolic heart failure, hypothyroidism, coronary artery disease, [facility] emergency room with a chief complaint shortness of breath and cough for the past week. She has been progressively short of breath and saw her PCP about 5 days ago, and she was given supportive treatment. Despite that, she did not improve and decided to come to the emergency room. She endorses a fever of 101.3 couple nights ago. At home she was found to be hypoxic requiring supplemental oxygen. She denies any chest pain or palpitations. She denies abdominal pain, nausea, vomiting or diarrhea. She endorses poor by mouth intake. She endorses weight loss over the last year, cannot quantify how much. She is not bringing up any phlegm with coughing. She denies any burning with  urination, she denies any blood in her stool or blood in her urine. In the emergency room, chest x-ray showed stable interstitial pulmonary edema with pleural effusions and left lower lobe atelectasis. Blood work is significant for mild thrombocytopenia and an elevated BNP. TRH was asked for admission for acute on chronic hypoxic respiratory failure.  Hospital Course:   Assessment/Plan: Active Problems:  Hypothyroidism  HLD (hyperlipidemia)  Anxiety state  INSOMNIA, CHRONIC  CAD - CABG '97, cath OK 2005, Myoview low risk 12/12  HTN (hypertension)  Chronic atrial fibrillation (HCC)  Carotid artery disease (HCC)  CKD (chronic kidney disease), stage III  Thrombocytopenia (HCC)  Dyspnea  Acute on chronic diastolic (congestive) heart failure (HCC)  Pulmonary hypertension (HCC)   Acute on chronic diastolic heart failure resulting in acute on chronic hypoxic respiratory failure - Patient normally uses oxygen at nighttime, was found to have low oxygen saturation with ambulation on room air, by supplemental oxygen as needed - IV Lasix 40 mg 1 in the emergency room, monitor I's and O's, daily weights - She recently underwent a 2-D echo 2 months ago which showed normal systolic function with an EF of 55%, and known pulmonary hypertension - received IV lasix. Weight stable at 90 pounds. Chest x ray with improved aeration. Patient will need oxygen on ambulation.  -SW, CM consulted to help with discharge planning.  -Patient is feeling better,. Her regular weight is 108 pounds. She is breathing better.   UTI; E coli on urine; sensitive to Bactrim. Will change doxy which patient was receiving  for bronchitis to bactrim.   Atrial fibrillation - patient's CHA2DS2-VASc Score for Stroke Risk is 5 - She is on aspirin and rate controlling agents, not on anticoagulation because of high fall risk. She was admitted 2 months ago with fall with cerebral contusion  - rate controlled on telemetry    CKD stage III - Creatinine slightly higher due to lasix this morning, stable, continue Lasix . Cr stable.   URI / bronchitis  - Patient with a fever of 101.3 at home, chest x-ray without any clear evidence of pneumonia,? Bronchitis, provide empiric bactrim.  - afebrile overnight  Hypertension - Resume home medications   Hyperlipidemia - Resume home medications  Hypothyroidism - Last TSH 2 months ago was normal, continue home Synthroid  Thrombocytopenia - Chronic, stable, no bleeding  Procedures:  none  Consultations:  none  Discharge Exam: Filed Vitals:   04/09/15 2205 04/10/15 0654  BP: 129/70 110/65  Pulse: 94 83  Temp: 98.2 F (36.8 C) 97.4 F (36.3 C)  Resp: 18 18    General: Alert in no distress Cardiovascular: S 1, S 2 RRR Respiratory: CTA  Discharge Instructions   Discharge Instructions    Diet - low sodium heart healthy    Complete by:  As directed      Increase activity slowly    Complete by:  As directed           Current Discharge Medication List    START taking these medications   Details  ipratropium-albuterol (DUONEB) 0.5-2.5 (3) MG/3ML SOLN Take 3 mLs by nebulization every 6 (six) hours as needed. Qty: 360 mL, Refills: 0    potassium chloride SA (K-DUR,KLOR-CON) 20 MEQ tablet Take 2 tablets (40 mEq total) by mouth once. Qty: 20 tablet, Refills: 0    sulfamethoxazole-trimethoprim (BACTRIM DS,SEPTRA DS) 800-160 MG tablet Take 1 tablet by mouth 2 (two) times daily. Qty: 6 tablet, Refills: 0      CONTINUE these medications which have CHANGED   Details  bisoprolol (ZEBETA) 5 MG tablet Take 1 tablet (5 mg total) by mouth daily. Qty: 30 tablet, Refills: 0    clonazePAM (KLONOPIN) 1 MG tablet Take 1 tablet (1 mg total) by mouth at bedtime as needed (anxiety / sleep). Qty: 30 tablet, Refills: 0    furosemide (LASIX) 20 MG tablet Take 1 tablet (20 mg total) by mouth daily. Qty: 30 tablet, Refills: 0      CONTINUE these  medications which have NOT CHANGED   Details  acetaminophen (TYLENOL) 500 MG tablet Take 500 mg by mouth every 6 (six) hours as needed for mild pain. For pain    aspirin 81 MG tablet Take 81 mg by mouth daily.      atorvastatin (LIPITOR) 80 MG tablet TAKE 1 TABLET BY MOUTH DAILY Qty: 90 tablet, Refills: 1   Associated Diagnoses: HLD (hyperlipidemia)    Calcium Carbonate-Vitamin D (CALCIUM 600-D) 600-400 MG-UNIT per tablet Take 1 tablet by mouth daily.     cyanocobalamin 500 MCG tablet Take 500 mcg by mouth daily.      diltiazem (TIAZAC) 120 MG 24 hr capsule TAKE 1 CAPSULE DAILY Qty: 90 capsule, Refills: 1    levothyroxine (SYNTHROID, LEVOTHROID) 88 MCG tablet TAKE 1 TABLET DAILY Qty: 90 tablet, Refills: 1    lubiprostone (AMITIZA) 24 MCG capsule Take 1 capsule (24 mcg total) by mouth 2 (two) times daily with a meal. Qty: 180 capsule, Refills: 3    mirtazapine (REMERON) 45 MG tablet TAKE 1 TABLET  AT BEDTIME Qty: 90 tablet, Refills: 1    Multiple Vitamin (MULTIVITAMIN) capsule Take 1 capsule by mouth daily.      omeprazole (PRILOSEC) 20 MG capsule TAKE 1 CAPSULE DAILY Qty: 90 capsule, Refills: 1    QUEtiapine (SEROQUEL) 25 MG tablet Take 1 tablet (25 mg total) by mouth at bedtime. Qty: 90 tablet, Refills: 1   Associated Diagnoses: Insomnia    traMADol (ULTRAM) 50 MG tablet Take 1 tablet (50 mg total) by mouth every 8 (eight) hours as needed. Qty: 30 tablet, Refills: 0    vitamin E 400 UNIT capsule Take 400 Units by mouth daily.         Allergies  Allergen Reactions  . Codeine     unknown  . Meperidine Hcl     unknown  . Oxycodone-Acetaminophen     unknown   Follow-up Information    Follow up with Webb Silversmith, NP In 3 days.   Specialty:  Internal Medicine   Contact information:   [place] [address] [phone]       Follow up with [facility].   Why:  They will do your home health care at your home   Contact  information:   [address] [place] [address] [phone]        The results of significant diagnostics from this hospitalization (including imaging, microbiology, ancillary and laboratory) are listed below for reference.    Significant Diagnostic Studies: Dg Chest 2 View  04/10/2015  CLINICAL DATA:  Shortness of breath, cough, congestion EXAM: CHEST  2 VIEW COMPARISON:  04/07/2015 FINDINGS: Prior CABG. There is hyperinflation of the lungs compatible with COPD. Heart is normal size. Small layering effusions bilaterally. No confluent airspace opacities or edema. No acute bony abnormality. IMPRESSION: COPD.  Small bilateral layering effusions. Electronically Signed   By: Rolm Baptise M.D.   On: 04/10/2015 12:53   Dg Chest 2 View  04/07/2015  CLINICAL DATA:  Productive cough.  Chest congestion. EXAM: CHEST  2 VIEW COMPARISON:  01/24/2015 and 01/22/2015. FINDINGS: The heart size and mediastinal contours are stable status post CABG. There is aortic atherosclerosis. Surgical clips are present within the right neck. Mild interstitial edema, small pleural effusions and asymmetric left lower lobe atelectasis are similar to the most recent examination. No evidence of consolidation. The bones are demineralized without apparent acute osseous findings. IMPRESSION: Stable interstitial pulmonary edema, pleural effusions and left lower lobe atelectasis. Electronically Signed   By: Richardean Sale M.D.   On: 04/07/2015 16:49    Microbiology: Recent Results (from the past 240 hour(s))  Culture, Urine     Status: None   Collection Time: 04/08/15  1:21 PM  Result Value Ref Range Status   Specimen Description URINE, RANDOM  Final   Special Requests NONE  Final   Culture >=100,000 COLONIES/mL ESCHERICHIA COLI  Final   Report Status 04/10/2015 FINAL  Final   Organism ID, Bacteria ESCHERICHIA COLI  Final      Susceptibility   Escherichia coli - MIC*    AMPICILLIN 8 SENSITIVE Sensitive      CEFAZOLIN <=4 SENSITIVE Sensitive     CEFTRIAXONE <=1 SENSITIVE Sensitive     CIPROFLOXACIN <=0.25 SENSITIVE Sensitive     GENTAMICIN <=1 SENSITIVE Sensitive     IMIPENEM <=0.25 SENSITIVE Sensitive     NITROFURANTOIN <=16 SENSITIVE Sensitive     TRIMETH/SULFA <=20 SENSITIVE Sensitive     AMPICILLIN/SULBACTAM 4 SENSITIVE Sensitive  PIP/TAZO <=4 SENSITIVE Sensitive     * >=100,000 COLONIES/mL ESCHERICHIA COLI     Labs: Basic Metabolic Panel:  Recent Labs Lab 04/07/15 1706 04/08/15 0336 04/09/15 0800 04/10/15 0447  NA 136 137 138 138  K 3.5 3.2* 4.1 3.3*  CL 92* 90* 93* 91*  CO2 37* 37* 35* 37*  GLUCOSE 107* 103* 101* 129*  BUN '13 11 12 15  '$ CREATININE 0.99 1.01* 1.02* 0.90  CALCIUM 8.9 8.5* 8.9 8.7*   Liver Function Tests:  Recent Labs Lab 04/08/15 0336  AST 38  ALT 24  ALKPHOS 79  BILITOT 0.5  PROT 5.9*  ALBUMIN 3.1*   No results for input(s): LIPASE, AMYLASE in the last 168 hours. No results for input(s): AMMONIA in the last 168 hours. CBC:  Recent Labs Lab 04/07/15 1706 04/08/15 0336  WBC 5.2 5.7  NEUTROABS 3.2  --   HGB 13.6 13.1  HCT 42.9 41.8  MCV 95.8 95.2  PLT 106* 117*   Cardiac Enzymes: No results for input(s): CKTOTAL, CKMB, CKMBINDEX, TROPONINI in the last 168 hours. BNP: BNP (last 3 results)  Recent Labs  01/22/15 1817 04/07/15 1706  BNP 417.6* 314.6*    ProBNP (last 3 results)  Recent Labs  01/28/15 1648  PROBNP 305.0*    CBG: No results for input(s): GLUCAP in the last 168 hours.     Signed:  Niel Hummer A  [facility] 04/10/2015, 1:41 PM

## 2015-04-10 NOTE — Care Management Important Message (Signed)
Important Message  Patient Details  Name: Jasmine [name] MRN: [id number] Date of Birth: April 09, 1923   Medicare Important Message Given:  Yes    Marcia Lepera P Izick Gasbarro 04/10/2015, 3:27 PM

## 2015-04-11 ENCOUNTER — Telehealth: Payer: Self-pay | Admitting: *Deleted

## 2015-04-11 NOTE — Telephone Encounter (Signed)
Transitional care call attempted.  Left message for patient to return call. 

## 2015-04-12 DIAGNOSIS — J9611 Chronic respiratory failure with hypoxia: Secondary | ICD-10-CM | POA: Diagnosis not present

## 2015-04-12 DIAGNOSIS — I272 Other secondary pulmonary hypertension: Secondary | ICD-10-CM | POA: Diagnosis not present

## 2015-04-12 DIAGNOSIS — B9620 Unspecified Escherichia coli [E. coli] as the cause of diseases classified elsewhere: Secondary | ICD-10-CM | POA: Diagnosis not present

## 2015-04-12 DIAGNOSIS — I2510 Atherosclerotic heart disease of native coronary artery without angina pectoris: Secondary | ICD-10-CM | POA: Diagnosis not present

## 2015-04-12 DIAGNOSIS — J069 Acute upper respiratory infection, unspecified: Secondary | ICD-10-CM | POA: Diagnosis not present

## 2015-04-12 DIAGNOSIS — Z9981 Dependence on supplemental oxygen: Secondary | ICD-10-CM | POA: Diagnosis not present

## 2015-04-12 DIAGNOSIS — Z87891 Personal history of nicotine dependence: Secondary | ICD-10-CM | POA: Diagnosis not present

## 2015-04-12 DIAGNOSIS — Z7982 Long term (current) use of aspirin: Secondary | ICD-10-CM | POA: Diagnosis not present

## 2015-04-12 DIAGNOSIS — N390 Urinary tract infection, site not specified: Secondary | ICD-10-CM | POA: Diagnosis not present

## 2015-04-12 DIAGNOSIS — F411 Generalized anxiety disorder: Secondary | ICD-10-CM | POA: Diagnosis not present

## 2015-04-12 DIAGNOSIS — Z951 Presence of aortocoronary bypass graft: Secondary | ICD-10-CM | POA: Diagnosis not present

## 2015-04-12 DIAGNOSIS — Z9181 History of falling: Secondary | ICD-10-CM | POA: Diagnosis not present

## 2015-04-12 DIAGNOSIS — N183 Chronic kidney disease, stage 3 (moderate): Secondary | ICD-10-CM | POA: Diagnosis not present

## 2015-04-12 DIAGNOSIS — I5032 Chronic diastolic (congestive) heart failure: Secondary | ICD-10-CM | POA: Diagnosis not present

## 2015-04-12 DIAGNOSIS — Z86010 Personal history of colonic polyps: Secondary | ICD-10-CM | POA: Diagnosis not present

## 2015-04-12 DIAGNOSIS — I130 Hypertensive heart and chronic kidney disease with heart failure and stage 1 through stage 4 chronic kidney disease, or unspecified chronic kidney disease: Secondary | ICD-10-CM | POA: Diagnosis not present

## 2015-04-12 DIAGNOSIS — I482 Chronic atrial fibrillation: Secondary | ICD-10-CM | POA: Diagnosis not present

## 2015-04-12 DIAGNOSIS — J40 Bronchitis, not specified as acute or chronic: Secondary | ICD-10-CM | POA: Diagnosis not present

## 2015-04-12 NOTE — Telephone Encounter (Signed)
Transition Care Management Follow-up Telephone Call  Date discharged? 04/10/15 How have you been since you were released from the hospital? Jasmine [name] pts daughter said pt has been doing good since discharge. Pt not doing a lot for herself since home.  Do you understand why you were in the hospital? yes  Do you understand the discharge instructions? Yes; Jasmine [name] cannot stay with pt 100 % of time but is there most of the time with the pt.  Where were you discharged to? home   Items Reviewed:  Medications reviewed: yes  Allergies reviewed: yes  Dietary changes reviewed: yes  Referrals reviewed: N/A   Functional Questionnaire:   Activities of Daily Living (ADLs):   She states they are independent in the following: ambulation, feeding and grooming and toileting States they require assistance with the following: bathing and hygiene; pt usually stays in gown and house coat.   Any transportation issues/concerns?: no   Any patient concerns? No  Confirmed importance and date/time of follow-up visits scheduled yes, has appt scheduled 04/19/15 at 1:30 PM Provider Appointment booked with [name] NP. Confirmed with patient if condition begins to worsen call PCP or go to the ER.  Patient was given the office number and encouraged to call back with question or concerns.  : yes

## 2015-04-16 DIAGNOSIS — N390 Urinary tract infection, site not specified: Secondary | ICD-10-CM | POA: Diagnosis not present

## 2015-04-16 DIAGNOSIS — I130 Hypertensive heart and chronic kidney disease with heart failure and stage 1 through stage 4 chronic kidney disease, or unspecified chronic kidney disease: Secondary | ICD-10-CM | POA: Diagnosis not present

## 2015-04-16 DIAGNOSIS — J40 Bronchitis, not specified as acute or chronic: Secondary | ICD-10-CM | POA: Diagnosis not present

## 2015-04-16 DIAGNOSIS — B9620 Unspecified Escherichia coli [E. coli] as the cause of diseases classified elsewhere: Secondary | ICD-10-CM | POA: Diagnosis not present

## 2015-04-16 DIAGNOSIS — N183 Chronic kidney disease, stage 3 (moderate): Secondary | ICD-10-CM | POA: Diagnosis not present

## 2015-04-16 DIAGNOSIS — I5032 Chronic diastolic (congestive) heart failure: Secondary | ICD-10-CM | POA: Diagnosis not present

## 2015-04-18 DIAGNOSIS — I5032 Chronic diastolic (congestive) heart failure: Secondary | ICD-10-CM | POA: Diagnosis not present

## 2015-04-18 DIAGNOSIS — B9620 Unspecified Escherichia coli [E. coli] as the cause of diseases classified elsewhere: Secondary | ICD-10-CM | POA: Diagnosis not present

## 2015-04-18 DIAGNOSIS — J40 Bronchitis, not specified as acute or chronic: Secondary | ICD-10-CM | POA: Diagnosis not present

## 2015-04-18 DIAGNOSIS — N183 Chronic kidney disease, stage 3 (moderate): Secondary | ICD-10-CM | POA: Diagnosis not present

## 2015-04-18 DIAGNOSIS — N390 Urinary tract infection, site not specified: Secondary | ICD-10-CM | POA: Diagnosis not present

## 2015-04-18 DIAGNOSIS — I130 Hypertensive heart and chronic kidney disease with heart failure and stage 1 through stage 4 chronic kidney disease, or unspecified chronic kidney disease: Secondary | ICD-10-CM | POA: Diagnosis not present

## 2015-04-19 ENCOUNTER — Telehealth: Payer: Self-pay | Admitting: Internal Medicine

## 2015-04-19 ENCOUNTER — Encounter: Payer: Self-pay | Admitting: Internal Medicine

## 2015-04-19 ENCOUNTER — Ambulatory Visit: Payer: Medicare Other | Admitting: Internal Medicine

## 2015-04-19 ENCOUNTER — Ambulatory Visit (INDEPENDENT_AMBULATORY_CARE_PROVIDER_SITE_OTHER): Payer: Medicare Other | Admitting: Internal Medicine

## 2015-04-19 VITALS — BP 108/62 | HR 70 | Temp 96.8°F | Wt 98.0 lb

## 2015-04-19 DIAGNOSIS — N390 Urinary tract infection, site not specified: Secondary | ICD-10-CM | POA: Diagnosis not present

## 2015-04-19 DIAGNOSIS — I509 Heart failure, unspecified: Secondary | ICD-10-CM

## 2015-04-19 DIAGNOSIS — I2510 Atherosclerotic heart disease of native coronary artery without angina pectoris: Secondary | ICD-10-CM | POA: Diagnosis not present

## 2015-04-19 LAB — BASIC METABOLIC PANEL
BUN: 18 mg/dL (ref 7–25)
CHLORIDE: 96 mmol/L — AB (ref 98–110)
CO2: 34 mmol/L — ABNORMAL HIGH (ref 20–31)
Calcium: 8.9 mg/dL (ref 8.6–10.4)
Creat: 1.31 mg/dL — ABNORMAL HIGH (ref 0.60–0.88)
Glucose, Bld: 99 mg/dL (ref 65–99)
POTASSIUM: 4.8 mmol/L (ref 3.5–5.3)
SODIUM: 135 mmol/L (ref 135–146)

## 2015-04-19 NOTE — Patient Instructions (Signed)

## 2015-04-19 NOTE — Telephone Encounter (Signed)
Pt's daughter, [name], called stating that the papers from pts d/c and rxs were thrown away at Christmas time on accident. Pt is needed the controlled substance Clonazepam rx re-prescribed. You can reach her at [phone].

## 2015-04-19 NOTE — Progress Notes (Signed)
Pre visit review using our clinic review tool, if applicable. No additional management support is needed unless otherwise documented below in the visit note. 

## 2015-04-19 NOTE — Telephone Encounter (Signed)
[facility]---they did not see where they had received order for

## 2015-04-19 NOTE — Progress Notes (Signed)
Subjective:    Patient ID: Jasmine [name], female    DOB: October 30, 1922, 81 y.o.   MRN: [id number]  HPI  Pt presents to the clinic today for TCM hospital follow up for CHF exacerbation. She went to the ER for ongoing cough and shortness of breath. Chest xray showed bilateral pleural effusions.  She was treated with IV Lasix. She was also put on Doxycycline for possible bronchitis. They found out she had a UTI that grew out [place], so they switched her Doxy to Septra. She was discharged home. Since she has been home, she reports she has been feeling well. She sleeps a lot. She does not have a good appetite. Her bowels are moving normally. She denies any urinary complaints. She has finished all of her antibiotics. She is taking her medications as prescribed.  Review of Systems  Past Medical History  Diagnosis Date  . Insomnia due to medical condition   . Anxiety   . Hyperlipidemia   . Hypertension   . Hypothyroidism   . Low back pain   . Left wrist fracture 09/2007  . Fracture of right shoulder 02/2008  . GERD (gastroesophageal reflux disease)   . Barrett's esophagus   . Hemorrhoids, external   . CAD (coronary artery disease) of artery bypass graft 1995  . Hiatal hernia   . History of colon polyps   . Atrial fibrillation with RVR (HCC)   . Syncope     Current Outpatient Prescriptions  Medication Sig Dispense Refill  . acetaminophen (TYLENOL) 500 MG tablet Take 1 tablet (500 mg total) by mouth every 6 (six) hours as needed for mild pain. For pain 30 tablet 0  . aspirin 81 MG tablet Take 81 mg by mouth daily.      Marland Kitchen atorvastatin (LIPITOR) 80 MG tablet TAKE 1 TABLET BY MOUTH DAILY (Patient taking differently: Take 80 mg by mouth daily. TAKE 1 TABLET BY MOUTH DAILY) 90 tablet 1  . bisoprolol (ZEBETA) 5 MG tablet Take 1 tablet (5 mg total) by mouth daily. 30 tablet 0  . Calcium Carbonate-Vitamin D (CALCIUM 600-D) 600-400 MG-UNIT per tablet Take 1 tablet by mouth daily.     . clonazePAM  (KLONOPIN) 1 MG tablet Take 1 tablet (1 mg total) by mouth at bedtime as needed (anxiety / sleep). 30 tablet 0  . cyanocobalamin 500 MCG tablet Take 500 mcg by mouth daily.      Marland Kitchen diltiazem (TIAZAC) 120 MG 24 hr capsule TAKE 1 CAPSULE DAILY 90 capsule 1  . furosemide (LASIX) 20 MG tablet Take 1 tablet (20 mg total) by mouth daily. 30 tablet 0  . ipratropium-albuterol (DUONEB) 0.5-2.5 (3) MG/3ML SOLN Take 3 mLs by nebulization every 6 (six) hours as needed. 360 mL 0  . levothyroxine (SYNTHROID, LEVOTHROID) 88 MCG tablet TAKE 1 TABLET DAILY 90 tablet 1  . lubiprostone (AMITIZA) 24 MCG capsule Take 1 capsule (24 mcg total) by mouth 2 (two) times daily with a meal. (Patient taking differently: Take 24 mcg by mouth daily with breakfast. ) 180 capsule 3  . mirtazapine (REMERON) 45 MG tablet TAKE 1 TABLET AT BEDTIME 90 tablet 1  . Multiple Vitamin (MULTIVITAMIN) capsule Take 1 capsule by mouth daily.      Marland Kitchen omeprazole (PRILOSEC) 20 MG capsule TAKE 1 CAPSULE DAILY 90 capsule 1  . potassium chloride SA (K-DUR,KLOR-CON) 20 MEQ tablet Take 2 tablets (40 mEq total) by mouth once. 20 tablet 0  . QUEtiapine (SEROQUEL) 25 MG tablet Take 1  tablet (25 mg total) by mouth at bedtime. 90 tablet 1  . sulfamethoxazole-trimethoprim (BACTRIM DS,SEPTRA DS) 800-160 MG tablet Take 1 tablet by mouth 2 (two) times daily. 6 tablet 0  . traMADol (ULTRAM) 50 MG tablet Take 1 tablet (50 mg total) by mouth every 8 (eight) hours as needed. (Patient taking differently: Take 50 mg by mouth every 8 (eight) hours as needed for moderate pain. ) 30 tablet 0  . vitamin E 400 UNIT capsule Take 400 Units by mouth daily.       No current facility-administered medications for this visit.    Allergies  Allergen Reactions  . Codeine     unknown  . Meperidine Hcl     unknown  . Oxycodone-Acetaminophen     unknown    Family History  Problem Relation Age of Onset  . Diabetes Son   . Heart disease Mother   . Heart disease Father      Social History   Social History  . Marital Status: Widowed    Spouse Name: N/A  . Number of Children: N/A  . Years of Education: N/A   Occupational History  . Not on file.   Social History Main Topics  . Smoking status: Former Smoker    Quit date: 04/20/1968  . Smokeless tobacco: Never Used     Comment: QUIT IN 1970  . Alcohol Use: No     Comment: STOPPED IN 1970  . Drug Use: No  . Sexual Activity: Not Currently   Other Topics Concern  . Not on file   Social History Narrative     Constitutional: Pt reports fatigue. Denies fever, malaise, headache or abrupt weight changes.  HEENT: Denies eye pain, eye redness, ear pain, ringing in the ears, wax buildup, runny nose, nasal congestion, bloody nose, or sore throat. Respiratory: Pt reports cough. Denies difficulty breathing, shortness of breath, or sputum production.   Cardiovascular: Denies chest pain, chest tightness, palpitations or swelling in the hands or feet.  Gastrointestinal: Pt reports poor appetite. Denies abdominal pain, bloating, constipation, diarrhea or blood in the stool.  GU: Denies urgency, frequency, pain with urination, burning sensation, blood in urine, odor or discharge. Musculoskeletal: Denies decrease in range of motion, difficulty with gait, muscle pain or joint pain and swelling.  Skin: Denies redness, rashes, lesions or ulcercations.   No other specific complaints in a complete review of systems (except as listed in HPI above).     Objective:   Physical Exam   BP 108/62 mmHg  Pulse 70  Temp(Src) 96.8 F (36 C) (Oral)  Wt 98 lb (44.453 kg)  SpO2 95%  Wt Readings from Last 3 Encounters:  04/10/15 90 lb 6.2 oz (41 kg)  04/02/15 103 lb (46.72 kg)  01/28/15 104 lb (47.174 kg)    General: Appears her stated age, chronically ill appearing in NAD. Neck:  No adenopathy noted.  Cardiovascular: Normal rate and rhythm. S1,S2 noted.  Pulmonary/Chest: Normal effort and positive vesicular breath  sounds. No respiratory distress. No wheezes, rales or ronchi noted.  Abdomen: Soft and nontender.  Musculoskeletal: Gait steady with the use of a walker.  Neurological: Alert and oriented. Hard of hearing.   BMET    Component Value Date/Time   NA 138 04/10/2015 0447   K 3.3* 04/10/2015 0447   CL 91* 04/10/2015 0447   CO2 37* 04/10/2015 0447   GLUCOSE 129* 04/10/2015 0447   BUN 15 04/10/2015 0447   CREATININE 0.90 04/10/2015 0447   CREATININE  1.00 01/15/2014 1528   CALCIUM 8.7* 04/10/2015 0447   GFRNONAA 55* 04/10/2015 0447   GFRAA >60 04/10/2015 0447    Lipid Panel     Component Value Date/Time   CHOL 131 12/03/2014 1655   TRIG 46.0 12/03/2014 1655   HDL 53.90 12/03/2014 1655   CHOLHDL 2 12/03/2014 1655   VLDL 9.2 12/03/2014 1655   LDLCALC 68 12/03/2014 1655    CBC    Component Value Date/Time   WBC 5.7 04/08/2015 0336   RBC 4.39 04/08/2015 0336   HGB 13.1 04/08/2015 0336   HCT 41.8 04/08/2015 0336   PLT 117* 04/08/2015 0336   MCV 95.2 04/08/2015 0336   MCH 29.8 04/08/2015 0336   MCHC 31.3 04/08/2015 0336   RDW 15.3 04/08/2015 0336   LYMPHSABS 1.3 04/07/2015 1706   MONOABS 0.6 04/07/2015 1706   EOSABS 0.1 04/07/2015 1706   BASOSABS 0.0 04/07/2015 1706    Hgb A1C Lab Results  Component Value Date   HGBA1C 6.0* 01/23/2015        Assessment & Plan:   Hospital follow up for CHF exacerbation, UTI:  Euvolemic on exam She will continue Lasix and Potassium Will check BNP and BMET today She has finished her Bactrim Encouraged her to drink plenty of fluids  RTC as needed or if symptoms persist or worsen

## 2015-04-20 ENCOUNTER — Encounter: Payer: Self-pay | Admitting: Internal Medicine

## 2015-04-20 LAB — BRAIN NATRIURETIC PEPTIDE: Brain Natriuretic Peptide: 172.1 pg/mL — ABNORMAL HIGH (ref 0.0–100.0)

## 2015-04-20 NOTE — Telephone Encounter (Signed)
It is a controlled substance. It can not be refilled for 30 days

## 2015-04-22 DIAGNOSIS — N390 Urinary tract infection, site not specified: Secondary | ICD-10-CM | POA: Diagnosis not present

## 2015-04-22 DIAGNOSIS — I5032 Chronic diastolic (congestive) heart failure: Secondary | ICD-10-CM | POA: Diagnosis not present

## 2015-04-22 DIAGNOSIS — J40 Bronchitis, not specified as acute or chronic: Secondary | ICD-10-CM | POA: Diagnosis not present

## 2015-04-22 DIAGNOSIS — I130 Hypertensive heart and chronic kidney disease with heart failure and stage 1 through stage 4 chronic kidney disease, or unspecified chronic kidney disease: Secondary | ICD-10-CM | POA: Diagnosis not present

## 2015-04-22 DIAGNOSIS — N183 Chronic kidney disease, stage 3 (moderate): Secondary | ICD-10-CM | POA: Diagnosis not present

## 2015-04-22 DIAGNOSIS — B9620 Unspecified Escherichia coli [E. coli] as the cause of diseases classified elsewhere: Secondary | ICD-10-CM | POA: Diagnosis not present

## 2015-04-23 DIAGNOSIS — N390 Urinary tract infection, site not specified: Secondary | ICD-10-CM | POA: Diagnosis not present

## 2015-04-23 DIAGNOSIS — I130 Hypertensive heart and chronic kidney disease with heart failure and stage 1 through stage 4 chronic kidney disease, or unspecified chronic kidney disease: Secondary | ICD-10-CM | POA: Diagnosis not present

## 2015-04-23 DIAGNOSIS — N183 Chronic kidney disease, stage 3 (moderate): Secondary | ICD-10-CM | POA: Diagnosis not present

## 2015-04-23 DIAGNOSIS — J40 Bronchitis, not specified as acute or chronic: Secondary | ICD-10-CM | POA: Diagnosis not present

## 2015-04-23 DIAGNOSIS — I5032 Chronic diastolic (congestive) heart failure: Secondary | ICD-10-CM | POA: Diagnosis not present

## 2015-04-23 DIAGNOSIS — B9620 Unspecified Escherichia coli [E. coli] as the cause of diseases classified elsewhere: Secondary | ICD-10-CM | POA: Diagnosis not present

## 2015-04-23 NOTE — Telephone Encounter (Signed)
Spoke to pt's daughter--ok per HIPAA she is aware as instructed---after letting her know Rx would not be able to be refilled until 30 days from Rx---daughter said "bye" and hung up the phone. I let daughter know that I also had lab results for pt before she hung up

## 2015-04-23 NOTE — Telephone Encounter (Signed)
Pts daughter left v/m requesting cb about refill.

## 2015-04-23 NOTE — Telephone Encounter (Signed)
noted 

## 2015-04-24 DIAGNOSIS — B9620 Unspecified Escherichia coli [E. coli] as the cause of diseases classified elsewhere: Secondary | ICD-10-CM | POA: Diagnosis not present

## 2015-04-24 DIAGNOSIS — J40 Bronchitis, not specified as acute or chronic: Secondary | ICD-10-CM | POA: Diagnosis not present

## 2015-04-24 DIAGNOSIS — N183 Chronic kidney disease, stage 3 (moderate): Secondary | ICD-10-CM | POA: Diagnosis not present

## 2015-04-24 DIAGNOSIS — I130 Hypertensive heart and chronic kidney disease with heart failure and stage 1 through stage 4 chronic kidney disease, or unspecified chronic kidney disease: Secondary | ICD-10-CM | POA: Diagnosis not present

## 2015-04-24 DIAGNOSIS — I5032 Chronic diastolic (congestive) heart failure: Secondary | ICD-10-CM | POA: Diagnosis not present

## 2015-04-24 DIAGNOSIS — N390 Urinary tract infection, site not specified: Secondary | ICD-10-CM | POA: Diagnosis not present

## 2015-04-24 MED ORDER — POTASSIUM CHLORIDE CRYS ER 20 MEQ PO TBCR: 40.0000 meq | EXTENDED_RELEASE_TABLET | Freq: Once | ORAL | Status: DC

## 2015-04-24 NOTE — Addendum Note (Signed)
Addended by: Desmond DikeKNIGHT, Ulani Degrasse H on: 04/24/2015 01:56 PM   Modules accepted: Orders

## 2015-04-25 ENCOUNTER — Telehealth: Payer: Self-pay | Admitting: Internal Medicine

## 2015-04-25 DIAGNOSIS — N183 Chronic kidney disease, stage 3 (moderate): Secondary | ICD-10-CM | POA: Diagnosis not present

## 2015-04-25 DIAGNOSIS — I5032 Chronic diastolic (congestive) heart failure: Secondary | ICD-10-CM | POA: Diagnosis not present

## 2015-04-25 DIAGNOSIS — N390 Urinary tract infection, site not specified: Secondary | ICD-10-CM | POA: Diagnosis not present

## 2015-04-25 DIAGNOSIS — J40 Bronchitis, not specified as acute or chronic: Secondary | ICD-10-CM | POA: Diagnosis not present

## 2015-04-25 DIAGNOSIS — B9620 Unspecified Escherichia coli [E. coli] as the cause of diseases classified elsewhere: Secondary | ICD-10-CM | POA: Diagnosis not present

## 2015-04-25 DIAGNOSIS — I130 Hypertensive heart and chronic kidney disease with heart failure and stage 1 through stage 4 chronic kidney disease, or unspecified chronic kidney disease: Secondary | ICD-10-CM | POA: Diagnosis not present

## 2015-04-25 MED ORDER — CLONAZEPAM 1 MG PO TABS: 1.0000 mg | ORAL_TABLET | Freq: Every evening | ORAL | Status: DC | PRN

## 2015-04-25 NOTE — Telephone Encounter (Signed)
I will refill it but this time only. I want to get a UDS on pt next time she comes in. Phone in Clonazepam

## 2015-04-25 NOTE — Telephone Encounter (Signed)
Jasmine OsgoodSheila davis [facility] is requesting verbal orders for Speech therapy and OT  eval and treat for both  cb number is [phone]

## 2015-04-25 NOTE — Telephone Encounter (Signed)
Ok for speech therapy and OT

## 2015-04-25 NOTE — Telephone Encounter (Signed)
Jasmine [name] is aware--Rx called into pharmacy

## 2015-04-25 NOTE — Telephone Encounter (Signed)
Verbal order given to Sheila as instructed. 

## 2015-04-25 NOTE — Addendum Note (Signed)
Addended by: Roena MaladyEVONTENNO, Nox Talent Y on: 04/25/2015 04:54 PM   Modules accepted: Orders

## 2015-04-25 NOTE — Telephone Encounter (Addendum)
Jasmine [name] pts daughter(DPR signed) left v/m; rehab nurse advised Jasmine KidneyDebra that pt will go thru withdrawals if no med is sent to pharmacy for pt. Pt having problems breathing earlier this morninng;right now pt does not seem to have trouble breathing. Jasmine KidneyDebra does not want pt to go thru withdrawals or problems breathing and wants cb about refill of clonazepam or substitute med. Pt has not had clonazepam in 2 days. [facility] [address]. Jasmine [name] request cb.

## 2015-04-26 DIAGNOSIS — I130 Hypertensive heart and chronic kidney disease with heart failure and stage 1 through stage 4 chronic kidney disease, or unspecified chronic kidney disease: Secondary | ICD-10-CM | POA: Diagnosis not present

## 2015-04-26 DIAGNOSIS — N183 Chronic kidney disease, stage 3 (moderate): Secondary | ICD-10-CM | POA: Diagnosis not present

## 2015-04-26 DIAGNOSIS — N390 Urinary tract infection, site not specified: Secondary | ICD-10-CM | POA: Diagnosis not present

## 2015-04-26 DIAGNOSIS — J40 Bronchitis, not specified as acute or chronic: Secondary | ICD-10-CM | POA: Diagnosis not present

## 2015-04-26 DIAGNOSIS — I5032 Chronic diastolic (congestive) heart failure: Secondary | ICD-10-CM | POA: Diagnosis not present

## 2015-04-26 DIAGNOSIS — B9620 Unspecified Escherichia coli [E. coli] as the cause of diseases classified elsewhere: Secondary | ICD-10-CM | POA: Diagnosis not present

## 2015-04-29 DIAGNOSIS — N390 Urinary tract infection, site not specified: Secondary | ICD-10-CM | POA: Diagnosis not present

## 2015-04-29 DIAGNOSIS — J40 Bronchitis, not specified as acute or chronic: Secondary | ICD-10-CM | POA: Diagnosis not present

## 2015-04-29 DIAGNOSIS — I5032 Chronic diastolic (congestive) heart failure: Secondary | ICD-10-CM | POA: Diagnosis not present

## 2015-04-29 DIAGNOSIS — I130 Hypertensive heart and chronic kidney disease with heart failure and stage 1 through stage 4 chronic kidney disease, or unspecified chronic kidney disease: Secondary | ICD-10-CM | POA: Diagnosis not present

## 2015-04-29 DIAGNOSIS — N183 Chronic kidney disease, stage 3 (moderate): Secondary | ICD-10-CM | POA: Diagnosis not present

## 2015-04-29 DIAGNOSIS — B9620 Unspecified Escherichia coli [E. coli] as the cause of diseases classified elsewhere: Secondary | ICD-10-CM | POA: Diagnosis not present

## 2015-05-01 DIAGNOSIS — B9620 Unspecified Escherichia coli [E. coli] as the cause of diseases classified elsewhere: Secondary | ICD-10-CM | POA: Diagnosis not present

## 2015-05-01 DIAGNOSIS — N390 Urinary tract infection, site not specified: Secondary | ICD-10-CM | POA: Diagnosis not present

## 2015-05-01 DIAGNOSIS — N183 Chronic kidney disease, stage 3 (moderate): Secondary | ICD-10-CM | POA: Diagnosis not present

## 2015-05-01 DIAGNOSIS — I130 Hypertensive heart and chronic kidney disease with heart failure and stage 1 through stage 4 chronic kidney disease, or unspecified chronic kidney disease: Secondary | ICD-10-CM | POA: Diagnosis not present

## 2015-05-01 DIAGNOSIS — J40 Bronchitis, not specified as acute or chronic: Secondary | ICD-10-CM | POA: Diagnosis not present

## 2015-05-01 DIAGNOSIS — I5032 Chronic diastolic (congestive) heart failure: Secondary | ICD-10-CM | POA: Diagnosis not present

## 2015-05-03 DIAGNOSIS — B9620 Unspecified Escherichia coli [E. coli] as the cause of diseases classified elsewhere: Secondary | ICD-10-CM | POA: Diagnosis not present

## 2015-05-03 DIAGNOSIS — J40 Bronchitis, not specified as acute or chronic: Secondary | ICD-10-CM | POA: Diagnosis not present

## 2015-05-03 DIAGNOSIS — I5032 Chronic diastolic (congestive) heart failure: Secondary | ICD-10-CM | POA: Diagnosis not present

## 2015-05-03 DIAGNOSIS — N390 Urinary tract infection, site not specified: Secondary | ICD-10-CM | POA: Diagnosis not present

## 2015-05-03 DIAGNOSIS — I130 Hypertensive heart and chronic kidney disease with heart failure and stage 1 through stage 4 chronic kidney disease, or unspecified chronic kidney disease: Secondary | ICD-10-CM | POA: Diagnosis not present

## 2015-05-03 DIAGNOSIS — N183 Chronic kidney disease, stage 3 (moderate): Secondary | ICD-10-CM | POA: Diagnosis not present

## 2015-05-06 DIAGNOSIS — I130 Hypertensive heart and chronic kidney disease with heart failure and stage 1 through stage 4 chronic kidney disease, or unspecified chronic kidney disease: Secondary | ICD-10-CM | POA: Diagnosis not present

## 2015-05-06 DIAGNOSIS — N390 Urinary tract infection, site not specified: Secondary | ICD-10-CM | POA: Diagnosis not present

## 2015-05-06 DIAGNOSIS — B9620 Unspecified Escherichia coli [E. coli] as the cause of diseases classified elsewhere: Secondary | ICD-10-CM | POA: Diagnosis not present

## 2015-05-06 DIAGNOSIS — I5032 Chronic diastolic (congestive) heart failure: Secondary | ICD-10-CM | POA: Diagnosis not present

## 2015-05-06 DIAGNOSIS — J40 Bronchitis, not specified as acute or chronic: Secondary | ICD-10-CM | POA: Diagnosis not present

## 2015-05-06 DIAGNOSIS — N183 Chronic kidney disease, stage 3 (moderate): Secondary | ICD-10-CM | POA: Diagnosis not present

## 2015-05-08 DIAGNOSIS — I5032 Chronic diastolic (congestive) heart failure: Secondary | ICD-10-CM | POA: Diagnosis not present

## 2015-05-08 DIAGNOSIS — B9620 Unspecified Escherichia coli [E. coli] as the cause of diseases classified elsewhere: Secondary | ICD-10-CM | POA: Diagnosis not present

## 2015-05-08 DIAGNOSIS — J40 Bronchitis, not specified as acute or chronic: Secondary | ICD-10-CM | POA: Diagnosis not present

## 2015-05-08 DIAGNOSIS — N390 Urinary tract infection, site not specified: Secondary | ICD-10-CM | POA: Diagnosis not present

## 2015-05-08 DIAGNOSIS — I130 Hypertensive heart and chronic kidney disease with heart failure and stage 1 through stage 4 chronic kidney disease, or unspecified chronic kidney disease: Secondary | ICD-10-CM | POA: Diagnosis not present

## 2015-05-08 DIAGNOSIS — N183 Chronic kidney disease, stage 3 (moderate): Secondary | ICD-10-CM | POA: Diagnosis not present

## 2015-05-09 DIAGNOSIS — B9620 Unspecified Escherichia coli [E. coli] as the cause of diseases classified elsewhere: Secondary | ICD-10-CM | POA: Diagnosis not present

## 2015-05-09 DIAGNOSIS — J40 Bronchitis, not specified as acute or chronic: Secondary | ICD-10-CM | POA: Diagnosis not present

## 2015-05-09 DIAGNOSIS — N390 Urinary tract infection, site not specified: Secondary | ICD-10-CM | POA: Diagnosis not present

## 2015-05-09 DIAGNOSIS — N183 Chronic kidney disease, stage 3 (moderate): Secondary | ICD-10-CM | POA: Diagnosis not present

## 2015-05-09 DIAGNOSIS — I130 Hypertensive heart and chronic kidney disease with heart failure and stage 1 through stage 4 chronic kidney disease, or unspecified chronic kidney disease: Secondary | ICD-10-CM | POA: Diagnosis not present

## 2015-05-09 DIAGNOSIS — I5032 Chronic diastolic (congestive) heart failure: Secondary | ICD-10-CM | POA: Diagnosis not present

## 2015-05-13 DIAGNOSIS — N390 Urinary tract infection, site not specified: Secondary | ICD-10-CM | POA: Diagnosis not present

## 2015-05-13 DIAGNOSIS — B9620 Unspecified Escherichia coli [E. coli] as the cause of diseases classified elsewhere: Secondary | ICD-10-CM | POA: Diagnosis not present

## 2015-05-13 DIAGNOSIS — I130 Hypertensive heart and chronic kidney disease with heart failure and stage 1 through stage 4 chronic kidney disease, or unspecified chronic kidney disease: Secondary | ICD-10-CM | POA: Diagnosis not present

## 2015-05-13 DIAGNOSIS — I5032 Chronic diastolic (congestive) heart failure: Secondary | ICD-10-CM | POA: Diagnosis not present

## 2015-05-13 DIAGNOSIS — J40 Bronchitis, not specified as acute or chronic: Secondary | ICD-10-CM | POA: Diagnosis not present

## 2015-05-13 DIAGNOSIS — N183 Chronic kidney disease, stage 3 (moderate): Secondary | ICD-10-CM | POA: Diagnosis not present

## 2015-05-14 DIAGNOSIS — I5032 Chronic diastolic (congestive) heart failure: Secondary | ICD-10-CM | POA: Diagnosis not present

## 2015-05-14 DIAGNOSIS — J40 Bronchitis, not specified as acute or chronic: Secondary | ICD-10-CM | POA: Diagnosis not present

## 2015-05-14 DIAGNOSIS — I130 Hypertensive heart and chronic kidney disease with heart failure and stage 1 through stage 4 chronic kidney disease, or unspecified chronic kidney disease: Secondary | ICD-10-CM | POA: Diagnosis not present

## 2015-05-14 DIAGNOSIS — N390 Urinary tract infection, site not specified: Secondary | ICD-10-CM | POA: Diagnosis not present

## 2015-05-14 DIAGNOSIS — B9620 Unspecified Escherichia coli [E. coli] as the cause of diseases classified elsewhere: Secondary | ICD-10-CM | POA: Diagnosis not present

## 2015-05-14 DIAGNOSIS — N183 Chronic kidney disease, stage 3 (moderate): Secondary | ICD-10-CM | POA: Diagnosis not present

## 2015-05-15 DIAGNOSIS — N390 Urinary tract infection, site not specified: Secondary | ICD-10-CM | POA: Diagnosis not present

## 2015-05-15 DIAGNOSIS — I130 Hypertensive heart and chronic kidney disease with heart failure and stage 1 through stage 4 chronic kidney disease, or unspecified chronic kidney disease: Secondary | ICD-10-CM | POA: Diagnosis not present

## 2015-05-15 DIAGNOSIS — J40 Bronchitis, not specified as acute or chronic: Secondary | ICD-10-CM | POA: Diagnosis not present

## 2015-05-15 DIAGNOSIS — I5032 Chronic diastolic (congestive) heart failure: Secondary | ICD-10-CM | POA: Diagnosis not present

## 2015-05-15 DIAGNOSIS — N183 Chronic kidney disease, stage 3 (moderate): Secondary | ICD-10-CM | POA: Diagnosis not present

## 2015-05-15 DIAGNOSIS — B9620 Unspecified Escherichia coli [E. coli] as the cause of diseases classified elsewhere: Secondary | ICD-10-CM | POA: Diagnosis not present

## 2015-05-16 DIAGNOSIS — N183 Chronic kidney disease, stage 3 (moderate): Secondary | ICD-10-CM | POA: Diagnosis not present

## 2015-05-16 DIAGNOSIS — I5032 Chronic diastolic (congestive) heart failure: Secondary | ICD-10-CM | POA: Diagnosis not present

## 2015-05-16 DIAGNOSIS — B9620 Unspecified Escherichia coli [E. coli] as the cause of diseases classified elsewhere: Secondary | ICD-10-CM | POA: Diagnosis not present

## 2015-05-16 DIAGNOSIS — I130 Hypertensive heart and chronic kidney disease with heart failure and stage 1 through stage 4 chronic kidney disease, or unspecified chronic kidney disease: Secondary | ICD-10-CM | POA: Diagnosis not present

## 2015-05-16 DIAGNOSIS — N390 Urinary tract infection, site not specified: Secondary | ICD-10-CM | POA: Diagnosis not present

## 2015-05-16 DIAGNOSIS — J40 Bronchitis, not specified as acute or chronic: Secondary | ICD-10-CM | POA: Diagnosis not present

## 2015-05-21 DIAGNOSIS — I130 Hypertensive heart and chronic kidney disease with heart failure and stage 1 through stage 4 chronic kidney disease, or unspecified chronic kidney disease: Secondary | ICD-10-CM | POA: Diagnosis not present

## 2015-05-21 DIAGNOSIS — N390 Urinary tract infection, site not specified: Secondary | ICD-10-CM | POA: Diagnosis not present

## 2015-05-21 DIAGNOSIS — I5032 Chronic diastolic (congestive) heart failure: Secondary | ICD-10-CM | POA: Diagnosis not present

## 2015-05-21 DIAGNOSIS — B9620 Unspecified Escherichia coli [E. coli] as the cause of diseases classified elsewhere: Secondary | ICD-10-CM | POA: Diagnosis not present

## 2015-05-21 DIAGNOSIS — N183 Chronic kidney disease, stage 3 (moderate): Secondary | ICD-10-CM | POA: Diagnosis not present

## 2015-05-21 DIAGNOSIS — J40 Bronchitis, not specified as acute or chronic: Secondary | ICD-10-CM | POA: Diagnosis not present

## 2015-05-22 ENCOUNTER — Other Ambulatory Visit: Payer: Self-pay | Admitting: Internal Medicine

## 2015-05-22 DIAGNOSIS — N183 Chronic kidney disease, stage 3 (moderate): Secondary | ICD-10-CM | POA: Diagnosis not present

## 2015-05-22 DIAGNOSIS — I130 Hypertensive heart and chronic kidney disease with heart failure and stage 1 through stage 4 chronic kidney disease, or unspecified chronic kidney disease: Secondary | ICD-10-CM | POA: Diagnosis not present

## 2015-05-22 DIAGNOSIS — N390 Urinary tract infection, site not specified: Secondary | ICD-10-CM | POA: Diagnosis not present

## 2015-05-22 DIAGNOSIS — J40 Bronchitis, not specified as acute or chronic: Secondary | ICD-10-CM | POA: Diagnosis not present

## 2015-05-22 DIAGNOSIS — I5032 Chronic diastolic (congestive) heart failure: Secondary | ICD-10-CM | POA: Diagnosis not present

## 2015-05-22 DIAGNOSIS — B9620 Unspecified Escherichia coli [E. coli] as the cause of diseases classified elsewhere: Secondary | ICD-10-CM | POA: Diagnosis not present

## 2015-05-22 MED ORDER — FUROSEMIDE 20 MG PO TABS: 20.0000 mg | ORAL_TABLET | Freq: Every day | ORAL | Status: DC

## 2015-05-27 ENCOUNTER — Other Ambulatory Visit: Payer: Self-pay

## 2015-05-27 MED ORDER — CLONAZEPAM 1 MG PO TABS: 1.0000 mg | ORAL_TABLET | Freq: Every evening | ORAL | Status: DC | PRN

## 2015-05-27 NOTE — Telephone Encounter (Signed)
Rx called in to pharmacy. 

## 2015-05-27 NOTE — Telephone Encounter (Signed)
Ok to phone in Clonazepam 

## 2015-05-27 NOTE — Telephone Encounter (Signed)
Jasmine [name] (DPR signed) request refill clonazepam to [facility] [address]. Last refilled # 30 on 04/25/15 and pt last f/u 04/19/15.

## 2015-05-28 ENCOUNTER — Encounter: Payer: Self-pay | Admitting: Gastroenterology

## 2015-05-28 DIAGNOSIS — J40 Bronchitis, not specified as acute or chronic: Secondary | ICD-10-CM | POA: Diagnosis not present

## 2015-05-28 DIAGNOSIS — I5032 Chronic diastolic (congestive) heart failure: Secondary | ICD-10-CM | POA: Diagnosis not present

## 2015-05-28 DIAGNOSIS — N183 Chronic kidney disease, stage 3 (moderate): Secondary | ICD-10-CM | POA: Diagnosis not present

## 2015-05-28 DIAGNOSIS — I130 Hypertensive heart and chronic kidney disease with heart failure and stage 1 through stage 4 chronic kidney disease, or unspecified chronic kidney disease: Secondary | ICD-10-CM | POA: Diagnosis not present

## 2015-05-28 DIAGNOSIS — B9620 Unspecified Escherichia coli [E. coli] as the cause of diseases classified elsewhere: Secondary | ICD-10-CM | POA: Diagnosis not present

## 2015-05-28 DIAGNOSIS — N390 Urinary tract infection, site not specified: Secondary | ICD-10-CM | POA: Diagnosis not present

## 2015-05-30 DIAGNOSIS — B9620 Unspecified Escherichia coli [E. coli] as the cause of diseases classified elsewhere: Secondary | ICD-10-CM | POA: Diagnosis not present

## 2015-05-30 DIAGNOSIS — J40 Bronchitis, not specified as acute or chronic: Secondary | ICD-10-CM | POA: Diagnosis not present

## 2015-05-30 DIAGNOSIS — N183 Chronic kidney disease, stage 3 (moderate): Secondary | ICD-10-CM | POA: Diagnosis not present

## 2015-05-30 DIAGNOSIS — I130 Hypertensive heart and chronic kidney disease with heart failure and stage 1 through stage 4 chronic kidney disease, or unspecified chronic kidney disease: Secondary | ICD-10-CM | POA: Diagnosis not present

## 2015-05-30 DIAGNOSIS — N390 Urinary tract infection, site not specified: Secondary | ICD-10-CM | POA: Diagnosis not present

## 2015-05-30 DIAGNOSIS — I5032 Chronic diastolic (congestive) heart failure: Secondary | ICD-10-CM | POA: Diagnosis not present

## 2015-06-04 DIAGNOSIS — I5032 Chronic diastolic (congestive) heart failure: Secondary | ICD-10-CM | POA: Diagnosis not present

## 2015-06-04 DIAGNOSIS — B9620 Unspecified Escherichia coli [E. coli] as the cause of diseases classified elsewhere: Secondary | ICD-10-CM | POA: Diagnosis not present

## 2015-06-04 DIAGNOSIS — N183 Chronic kidney disease, stage 3 (moderate): Secondary | ICD-10-CM | POA: Diagnosis not present

## 2015-06-04 DIAGNOSIS — J40 Bronchitis, not specified as acute or chronic: Secondary | ICD-10-CM | POA: Diagnosis not present

## 2015-06-04 DIAGNOSIS — N390 Urinary tract infection, site not specified: Secondary | ICD-10-CM | POA: Diagnosis not present

## 2015-06-04 DIAGNOSIS — I130 Hypertensive heart and chronic kidney disease with heart failure and stage 1 through stage 4 chronic kidney disease, or unspecified chronic kidney disease: Secondary | ICD-10-CM | POA: Diagnosis not present

## 2015-06-05 ENCOUNTER — Ambulatory Visit: Payer: Medicare Other | Admitting: Internal Medicine

## 2015-06-06 DIAGNOSIS — J40 Bronchitis, not specified as acute or chronic: Secondary | ICD-10-CM | POA: Diagnosis not present

## 2015-06-06 DIAGNOSIS — N390 Urinary tract infection, site not specified: Secondary | ICD-10-CM | POA: Diagnosis not present

## 2015-06-06 DIAGNOSIS — B9620 Unspecified Escherichia coli [E. coli] as the cause of diseases classified elsewhere: Secondary | ICD-10-CM | POA: Diagnosis not present

## 2015-06-06 DIAGNOSIS — N183 Chronic kidney disease, stage 3 (moderate): Secondary | ICD-10-CM | POA: Diagnosis not present

## 2015-06-06 DIAGNOSIS — I5032 Chronic diastolic (congestive) heart failure: Secondary | ICD-10-CM | POA: Diagnosis not present

## 2015-06-06 DIAGNOSIS — I130 Hypertensive heart and chronic kidney disease with heart failure and stage 1 through stage 4 chronic kidney disease, or unspecified chronic kidney disease: Secondary | ICD-10-CM | POA: Diagnosis not present

## 2015-06-10 ENCOUNTER — Ambulatory Visit (INDEPENDENT_AMBULATORY_CARE_PROVIDER_SITE_OTHER): Payer: Medicare Other | Admitting: Internal Medicine

## 2015-06-10 ENCOUNTER — Encounter: Payer: Self-pay | Admitting: Internal Medicine

## 2015-06-10 VITALS — BP 116/68 | HR 73 | Temp 97.6°F | Wt 98.0 lb

## 2015-06-10 DIAGNOSIS — I779 Disorder of arteries and arterioles, unspecified: Secondary | ICD-10-CM | POA: Diagnosis not present

## 2015-06-10 DIAGNOSIS — I10 Essential (primary) hypertension: Secondary | ICD-10-CM

## 2015-06-10 DIAGNOSIS — Z79899 Other long term (current) drug therapy: Secondary | ICD-10-CM | POA: Diagnosis not present

## 2015-06-10 DIAGNOSIS — I739 Peripheral vascular disease, unspecified: Principal | ICD-10-CM

## 2015-06-10 DIAGNOSIS — E785 Hyperlipidemia, unspecified: Secondary | ICD-10-CM

## 2015-06-10 DIAGNOSIS — K2270 Barrett's esophagus without dysplasia: Secondary | ICD-10-CM

## 2015-06-10 DIAGNOSIS — I5033 Acute on chronic diastolic (congestive) heart failure: Secondary | ICD-10-CM | POA: Diagnosis not present

## 2015-06-10 DIAGNOSIS — Z79891 Long term (current) use of opiate analgesic: Secondary | ICD-10-CM | POA: Diagnosis not present

## 2015-06-10 DIAGNOSIS — I482 Chronic atrial fibrillation, unspecified: Secondary | ICD-10-CM

## 2015-06-10 DIAGNOSIS — G4700 Insomnia, unspecified: Secondary | ICD-10-CM

## 2015-06-10 DIAGNOSIS — E039 Hypothyroidism, unspecified: Secondary | ICD-10-CM

## 2015-06-10 DIAGNOSIS — F411 Generalized anxiety disorder: Secondary | ICD-10-CM

## 2015-06-10 MED ORDER — CLONAZEPAM 1 MG PO TABS: ORAL_TABLET | ORAL | Status: DC

## 2015-06-10 NOTE — Assessment & Plan Note (Signed)
Continue Bisoprolol and Cardizem ASA only for anticoag

## 2015-06-10 NOTE — Progress Notes (Signed)
Pre visit review using our clinic review tool, if applicable. No additional management support is needed unless otherwise documented below in the visit note. 

## 2015-06-10 NOTE — Assessment & Plan Note (Signed)
Klonipin was mistakenly decreased Will increase back to current dose, call in new RX today

## 2015-06-10 NOTE — Assessment & Plan Note (Signed)
Continue Prilosec

## 2015-06-10 NOTE — Assessment & Plan Note (Signed)
Controlled off meds.

## 2015-06-10 NOTE — Assessment & Plan Note (Signed)
-  Continue Lipitor °

## 2015-06-10 NOTE — Assessment & Plan Note (Signed)
Euvolemic on exam Continue Lasix 

## 2015-06-10 NOTE — Assessment & Plan Note (Signed)
Continue ASA and Lipitor. 

## 2015-06-10 NOTE — Assessment & Plan Note (Signed)
Continue Seroquel, Remeron and Klonipin

## 2015-06-10 NOTE — Progress Notes (Signed)
Subjective:    Patient ID: Jasmine [name], female    DOB: 08/04/1922, 82 y.o.   MRN: [id number]  HPI  Pt presents to the clinic today for 6 month follow up of chronic conditions.  Insomnia: She reports she sleeps okay. Her daughter reports she does not sleep well. She can not tell me how many hours she gets per night. She takes Remeron, Klonipin and Seroquel nightly. She does not feel sleepy during the day but her daughter reports that she often naps in her chair.  Anxiety: She takes Klonopin three times per day. It seems to control her anxiety very well.  Barretts Esophagus: She denies s/s of reflux on Prilosec.  CAD/HLD: Her last LDL was 68. She denies myalgias on Lipitor. She denies chest pain. Cardiology note from 12/2014 reviewed.  CHF: Now on Lasix. Echo from 01/2015 reviewed. EF > 55%.  HTN: BP well controlled off meds. She does have a history of orthostatic hypotension but has not had an issue with this recently. Her BP today is 116/68.  Hypothyroidism: She denies any current issues on Synthroid. She is due to have her labs checked today.  PAF: She takes Bisoprolol and Diltiazem as prescribed. Her Xarelto was stopped secondary to fall risk by Dr. Allyson Sabal.  Constipation: She is moving her bowels regularly on Amitiza.  Review of Systems      Past Medical History  Diagnosis Date  . Insomnia due to medical condition   . Anxiety   . Hyperlipidemia   . Hypertension   . Hypothyroidism   . Low back pain   . Left wrist fracture 09/2007  . Fracture of right shoulder 02/2008  . GERD (gastroesophageal reflux disease)   . Barrett's esophagus   . Hemorrhoids, external   . CAD (coronary artery disease) of artery bypass graft 1995  . Hiatal hernia   . History of colon polyps   . Atrial fibrillation with RVR (HCC)   . Syncope     Current Outpatient Prescriptions  Medication Sig Dispense Refill  . acetaminophen (TYLENOL) 500 MG tablet Take 1 tablet (500 mg total) by mouth every  6 (six) hours as needed for mild pain. For pain 30 tablet 0  . aspirin 81 MG tablet Take 81 mg by mouth daily.      Marland Kitchen atorvastatin (LIPITOR) 80 MG tablet TAKE 1 TABLET BY MOUTH DAILY (Patient taking differently: Take 80 mg by mouth daily. TAKE 1 TABLET BY MOUTH DAILY) 90 tablet 1  . bisoprolol (ZEBETA) 5 MG tablet Take 1 tablet (5 mg total) by mouth daily. 30 tablet 0  . Calcium Carbonate-Vitamin D (CALCIUM 600-D) 600-400 MG-UNIT per tablet Take 1 tablet by mouth daily.     . clonazePAM (KLONOPIN) 1 MG tablet Take 1 tablet (1 mg total) by mouth at bedtime as needed (anxiety / sleep). 30 tablet 0  . cyanocobalamin 500 MCG tablet Take 500 mcg by mouth daily.      Marland Kitchen diltiazem (TIAZAC) 120 MG 24 hr capsule TAKE 1 CAPSULE DAILY 90 capsule 1  . furosemide (LASIX) 20 MG tablet Take 1 tablet (20 mg total) by mouth daily. 90 tablet 1  . ipratropium-albuterol (DUONEB) 0.5-2.5 (3) MG/3ML SOLN Take 3 mLs by nebulization every 6 (six) hours as needed. 360 mL 0  . levothyroxine (SYNTHROID, LEVOTHROID) 88 MCG tablet TAKE 1 TABLET DAILY 90 tablet 1  . lubiprostone (AMITIZA) 24 MCG capsule Take 1 capsule (24 mcg total) by mouth 2 (two) times daily with a  meal. (Patient taking differently: Take 24 mcg by mouth daily with breakfast. ) 180 capsule 3  . mirtazapine (REMERON) 45 MG tablet TAKE 1 TABLET AT BEDTIME 90 tablet 1  . Multiple Vitamin (MULTIVITAMIN) capsule Take 1 capsule by mouth daily.      Marland Kitchen omeprazole (PRILOSEC) 20 MG capsule TAKE 1 CAPSULE DAILY 90 capsule 1  . potassium chloride SA (K-DUR,KLOR-CON) 20 MEQ tablet Take 2 tablets (40 mEq total) by mouth once. 180 tablet 1  . QUEtiapine (SEROQUEL) 25 MG tablet Take 1 tablet (25 mg total) by mouth at bedtime. 90 tablet 1  . sulfamethoxazole-trimethoprim (BACTRIM DS,SEPTRA DS) 800-160 MG tablet Take 1 tablet by mouth 2 (two) times daily. 6 tablet 0  . traMADol (ULTRAM) 50 MG tablet Take 1 tablet (50 mg total) by mouth every 8 (eight) hours as needed. (Patient  taking differently: Take 50 mg by mouth every 8 (eight) hours as needed for moderate pain. ) 30 tablet 0  . vitamin E 400 UNIT capsule Take 400 Units by mouth daily.       No current facility-administered medications for this visit.    Allergies  Allergen Reactions  . Codeine     unknown  . Meperidine Hcl     unknown  . Oxycodone-Acetaminophen     unknown    Family History  Problem Relation Age of Onset  . Diabetes Son   . Heart disease Mother   . Heart disease Father     Social History   Social History  . Marital Status: Widowed    Spouse Name: N/A  . Number of Children: N/A  . Years of Education: N/A   Occupational History  . Not on file.   Social History Main Topics  . Smoking status: Former Smoker    Quit date: 04/20/1968  . Smokeless tobacco: Never Used     Comment: QUIT IN 1970  . Alcohol Use: No     Comment: STOPPED IN 1970  . Drug Use: No  . Sexual Activity: Not Currently   Other Topics Concern  . Not on file   Social History Narrative     Constitutional: Denies fever, malaise, fatigue, headache or abrupt weight changes.  HEENT: Denies eye pain, eye redness, ear pain, ringing in the ears, wax buildup, runny nose, nasal congestion, bloody nose, or sore throat. Respiratory: Denies difficulty breathing, shortness of breath, cough or sputum production.   Cardiovascular: Denies chest pain, chest tightness, palpitations or swelling in the hands or feet.  Gastrointestinal: Pt reports poor appetite. Denies abdominal pain, bloating, constipation, diarrhea or blood in the stool.  GU: Denies urgency, frequency, pain with urination, burning sensation, blood in urine, odor or discharge. Musculoskeletal: Denies decrease in range of motion, difficulty with gait, muscle pain or joint pain and swelling.  Skin: Denies redness, rashes, lesions or ulcercations.  Neurological: Pt reports difficulty with memory. Denies dizziness, difficulty with speech.  Psych: Pt  reports anxiety, depression, SI/HI.  No other specific complaints in a complete review of systems (except as listed in HPI above).  Objective:   Physical Exam  BP 116/68 mmHg  Pulse 73  Temp(Src) 97.6 F (36.4 C) (Oral)  Wt 98 lb (44.453 kg)  SpO2 97%  Wt Readings from Last 3 Encounters:  04/19/15 98 lb (44.453 kg)  04/10/15 90 lb 6.2 oz (41 kg)  04/02/15 103 lb (46.72 kg)    General: Appears her stated age, chronically ill appearing, in NAD. Skin: Warm, dry and intact. Seborrheic keratosis  noted on left cheek. Neck: Neck supple, trachea midline. No masses, lumps or thyromegaly present.  Cardiovascular: Normal rate with irregular rhythm. S1,S2 noted.  No murmur, rubs or gallops noted. No JVD or BLE edema. No carotid bruits noted. Pulmonary/Chest: Normal effort and positive vesicular breath sounds. No respiratory distress. No wheezes, rales or ronchi noted.  Abdomen: Soft and nontender. Normal bowel sounds. Neurological: Alert and oriented.  Psychiatric: Mood and affect normal.   BMET    Component Value Date/Time   NA 135 04/19/2015 1624   K 4.8 04/19/2015 1624   CL 96* 04/19/2015 1624   CO2 34* 04/19/2015 1624   GLUCOSE 99 04/19/2015 1624   BUN 18 04/19/2015 1624   CREATININE 1.31* 04/19/2015 1624   CREATININE 0.90 04/10/2015 0447   CALCIUM 8.9 04/19/2015 1624   GFRNONAA 55* 04/10/2015 0447   GFRAA >60 04/10/2015 0447    Lipid Panel     Component Value Date/Time   CHOL 131 12/03/2014 1655   TRIG 46.0 12/03/2014 1655   HDL 53.90 12/03/2014 1655   CHOLHDL 2 12/03/2014 1655   VLDL 9.2 12/03/2014 1655   LDLCALC 68 12/03/2014 1655    CBC    Component Value Date/Time   WBC 5.7 04/08/2015 0336   RBC 4.39 04/08/2015 0336   HGB 13.1 04/08/2015 0336   HCT 41.8 04/08/2015 0336   PLT 117* 04/08/2015 0336   MCV 95.2 04/08/2015 0336   MCH 29.8 04/08/2015 0336   MCHC 31.3 04/08/2015 0336   RDW 15.3 04/08/2015 0336   LYMPHSABS 1.3 04/07/2015 1706   MONOABS 0.6  04/07/2015 1706   EOSABS 0.1 04/07/2015 1706   BASOSABS 0.0 04/07/2015 1706    Hgb A1C Lab Results  Component Value Date   HGBA1C 6.0* 01/23/2015         Assessment & Plan:     RTC in 6 months for Medicare Wellness exam

## 2015-06-10 NOTE — Assessment & Plan Note (Signed)
Continue Synthroid at this time

## 2015-06-10 NOTE — Patient Instructions (Signed)
Barrett's Esophagus  Barrett's esophagus occurs when the lining of the esophagus is damaged. The esophagus is the tube that carries food from the mouth to the stomach. With Barrett's esophagus, the lining of the esophagus gets replaced by material that is similar to the lining in the intestines. This process is called intestinal metaplasia. A small number of people with Barrett's esophagus develop esophageal cancer.  CAUSES   The exact cause of Barrett's esophagus is unknown.  SYMPTOMS   Most people with Barrett's esophagus do not have symptoms. However, many patients also have gastroesophageal reflux disease (GERD). GERD can cause heartburn, trouble swallowing, and a dry cough.  DIAGNOSIS  Barrett's esophagus is diagnosed by an exam called upper gastrointestinal endoscopy. A thin, flexible tube (endoscope) is passed down the esophagus. The endoscope has a light and camera on the end. Your caregiver uses the endoscope to view the inside of the esophagus. A tissue sample may also be taken and examined under a microscope (biopsy). If cancer cells are found during the biopsy, this condition is called dysplasia.  TREATMENT   If you have no dysplasia or low-grade dysplasia, your caregiver may recommend no treatment or only taking medicines to treat GERD. Sometimes, taking acid-blocking drugs to treat GERD helps improve the tissue affected by Barrett's esophagus. Your caregiver may also recommend periodic esophageal exams.  If you have high-grade dysplasia, treatment may include removing the damaged parts of the esophagus. This can be done by heating, freezing, or surgically removing the tissue. In some cases, surgery may be done to remove most of the esophagus. The stomach is then attached to the remaining portion of the esophagus.  HOME CARE INSTRUCTIONS  · Take acid-blocking drugs for GERD if recommended by your caregiver.  · Keep all follow-up appointments as directed by your caregiver. You may need periodic  esophageal exams.  SEEK IMMEDIATE MEDICAL CARE IF:  · You have chest pain.  · You have trouble swallowing.  · You vomit blood or material that looks like coffee grounds.  · Your stools are bright red or dark.     This information is not intended to replace advice given to you by your health care provider. Make sure you discuss any questions you have with your health care provider.     Document Released: 06/27/2003 Document Revised: 10/06/2011 Document Reviewed: 06/16/2011  Elsevier Interactive Patient Education ©2016 [employer]

## 2015-06-21 ENCOUNTER — Other Ambulatory Visit: Payer: Self-pay | Admitting: Internal Medicine

## 2015-06-21 NOTE — Telephone Encounter (Signed)
She had it done in the hospital. Will send electronically.

## 2015-06-21 NOTE — Telephone Encounter (Signed)
Pt had 6 mth f/u with you last month and in the note you mentioned to check labs but I do not see any ordered---last lab for thyroid was 11/2014---does pt need to come in for labs?--please advise

## 2015-06-25 ENCOUNTER — Encounter: Payer: Self-pay | Admitting: Internal Medicine

## 2015-07-11 ENCOUNTER — Other Ambulatory Visit: Payer: Self-pay | Admitting: Internal Medicine

## 2015-07-18 ENCOUNTER — Other Ambulatory Visit: Payer: Self-pay | Admitting: Internal Medicine

## 2015-07-18 NOTE — Telephone Encounter (Signed)
Last filled 06/10/2015--please advise

## 2015-07-18 NOTE — Telephone Encounter (Signed)
Ok to phone in Clonazepam 

## 2015-07-18 NOTE — Telephone Encounter (Signed)
Rx called in to pharmacy. 

## 2015-07-19 ENCOUNTER — Other Ambulatory Visit: Payer: Self-pay | Admitting: Internal Medicine

## 2015-08-19 ENCOUNTER — Other Ambulatory Visit: Payer: Self-pay | Admitting: Internal Medicine

## 2015-08-19 NOTE — Telephone Encounter (Signed)
Rx called in to pharmacy. 

## 2015-08-19 NOTE — Telephone Encounter (Signed)
Ok to phone in Clonazepam 

## 2015-08-19 NOTE — Telephone Encounter (Signed)
Last filled 07/18/2015--please advise

## 2015-09-03 ENCOUNTER — Telehealth: Payer: Self-pay | Admitting: Cardiovascular Disease

## 2015-09-03 NOTE — Telephone Encounter (Signed)
Spoke with pt dtr, the pt has always had the wire prominent because of her stature.  It has now protruded through the skin. There is a small open area with a scab.  They noticed it this weekend. The area is sore to the touch. Left a message for nurse at the surgeon's office to call me to discuss. Pt dtr agreed with this plan.

## 2015-09-03 NOTE — Telephone Encounter (Signed)
Spoke with [facility], they are going to contact the pt regarding an appointment to be seen.

## 2015-09-03 NOTE — Telephone Encounter (Signed)
[name] is calling because her mom Mrs. Jasmine [name] has a wire that has popped out of her chest from an Open heart Surgery done back in the 90's . Patient was seen last in 2015 .Marland Kitchen. Please call   Thanks

## 2015-09-06 ENCOUNTER — Telehealth: Payer: Self-pay | Admitting: Cardiovascular Disease

## 2015-09-06 ENCOUNTER — Other Ambulatory Visit: Payer: Self-pay | Admitting: Cardiothoracic Surgery

## 2015-09-06 DIAGNOSIS — Z951 Presence of aortocoronary bypass graft: Secondary | ICD-10-CM

## 2015-09-06 DIAGNOSIS — I2510 Atherosclerotic heart disease of native coronary artery without angina pectoris: Secondary | ICD-10-CM

## 2015-09-06 NOTE — Telephone Encounter (Signed)
Called [facility] at [phone], they said they had not received the referral yet.  Therefore, referral entered into [facility]. The receptionist said someone would call the pt on Monday. Will call pt back.

## 2015-09-06 NOTE — Telephone Encounter (Signed)
Called [name], pt's daughter, ok per DPR. Gave her the information that referral was placed and they should call on Monday.  If she does not receive a call [facility] office number provided.  In further conversation with the daughter, she said the area was looking worse. She went to look at it and visually saw the wire sticking out. Then area surrounding it is red and tender. She thinks it has gotten worse over the past few days. With this information, it was advised for the pt to go to the ER due to that the weekend is coming up and appointment for the referral would not be until later next week. The daughter verbalized understanding and agreed to take her mother to the ER to have the area checked out.

## 2015-09-06 NOTE — Telephone Encounter (Signed)
Follow Up   Pt daughter states that [facility] has not called regarding the appt. Pt daughter request a call back to discuss how to move forward with patients care.

## 2015-09-07 ENCOUNTER — Other Ambulatory Visit: Payer: Self-pay | Admitting: Internal Medicine

## 2015-09-09 ENCOUNTER — Encounter: Payer: Self-pay | Admitting: Cardiothoracic Surgery

## 2015-09-09 ENCOUNTER — Ambulatory Visit
Admission: RE | Admit: 2015-09-09 | Discharge: 2015-09-09 | Disposition: A | Discharge status: Home / Self Care | Payer: Medicare Other | Source: Ambulatory Visit | Attending: Cardiothoracic Surgery | Admitting: Cardiothoracic Surgery

## 2015-09-09 ENCOUNTER — Ambulatory Visit (INDEPENDENT_AMBULATORY_CARE_PROVIDER_SITE_OTHER): Payer: Medicare Other | Admitting: Cardiothoracic Surgery

## 2015-09-09 VITALS — BP 129/72 | HR 74 | Resp 20 | Ht 64.0 in | Wt 98.0 lb

## 2015-09-09 DIAGNOSIS — Z951 Presence of aortocoronary bypass graft: Secondary | ICD-10-CM

## 2015-09-09 DIAGNOSIS — I2510 Atherosclerotic heart disease of native coronary artery without angina pectoris: Secondary | ICD-10-CM

## 2015-09-09 DIAGNOSIS — J449 Chronic obstructive pulmonary disease, unspecified: Secondary | ICD-10-CM | POA: Diagnosis not present

## 2015-09-09 NOTE — Progress Notes (Signed)
[address]       [place] [address]             [phone]                    GERYL DOHN [facility] Medical Record #[id number] Date of Birth: [birth year]  Referring: Lorre Munroe, NP Primary Care: Nicki Reaper, NP Cardiology: Dr Allyson Sabal  Chief Complaint:   No chief complaint on file.   History of Present Illness:    Jasmine [name] 82 y.o. female is seen in the office  today for painful sternal wire. She had CABG with combined carotid in 1997. She has lost weight  And lower sternal wire has eroded through the  Skin.        Current Activity/ Functional Status:  Patient is independent with mobility/ambulation, transfers, ADL's, IADL's.   Zubrod Score: At the time of surgery this patient's most appropriate activity status/level should be described as:     0    Normal activity, no symptoms     1    Restricted in physical strenuous activity but ambulatory, able to do out light work     2    Ambulatory and capable of self care, unable to do work activities, up and about               >50 % of waking hours                                  3    Only limited self care, in bed greater than 50% of waking hours     4    Completely disabled, no self care, confined to bed or chair     5    Moribund   Past Medical History  Diagnosis Date  . Insomnia due to medical condition   . Anxiety   . Hyperlipidemia   . Hypertension   . Hypothyroidism   . Low back pain   . Left wrist fracture 09/2007  . Fracture of right shoulder 02/2008  . GERD (gastroesophageal reflux disease)   . Barrett's esophagus   . Hemorrhoids, external   . CAD (coronary artery disease) of artery bypass graft 1995  . Hiatal hernia   . History of colon polyps   . Atrial fibrillation with RVR (HCC)   . Syncope     Past Surgical History  Procedure Laterality Date  . Colonoscopy w/ polypectomy  06/2005  . Carotid endarterectomy    . Cataract extraction    . Carotid doppler  Bilateral 05/15/2010    Right Bulb/CEA-demonstrated trace irregular nonhemodynamically significant plaque 0-49%. Left Bulb/Proximal ICA-demonstrated irregular nonhemodynamically signifcant plaque 0-49%.  . Cardiac catheterization  02/25/1996    No intervention-recommend CABG. Left main-80% concentric distal stenosis, LAD-99% ostial stenosis, first diag-80% segmental proximal stenosis, Circumflex-80% ostial stenosis, RCA-90% ostial stenosis.  . Cardiac catheterization  05/21/2003    No intervention-widely patent grafts.  . Coronary artery bypass graft  1997  . Lexiscan myoview  04/03/2011    No scintigraphic evidence of inducible myocardial ischemia. No lexiscan EKG changes. Non-diagnostic for ischemia.   . Transthoracic echocardiogram  01/08/2011    EF 50-55%, LA moderately dilated, moderate tricuspid regurg, mild pulmonary hypertension.    Family History  Problem Relation Age of Onset  . Diabetes Son   . Heart disease Mother   .  Heart disease Father     Social History   Social History  . Marital Status: Widowed    Spouse Name: N/A  . Number of Children: N/A  . Years of Education: N/A   Occupational History  . Not on file.   Social History Main Topics  . Smoking status: Former Smoker    Quit date: 04/20/1968  . Smokeless tobacco: Never Used     Comment: QUIT IN 1970  . Alcohol Use: No     Comment: STOPPED IN 1970  . Drug Use: No  . Sexual Activity: Not Currently   Other Topics Concern  . Not on file   Social History Narrative    History  Smoking status  . Former Smoker  . Quit date: 04/20/1968  Smokeless tobacco  . Never Used    Comment: QUIT IN 1970    History  Alcohol Use No    Comment: STOPPED IN 1970     Allergies  Allergen Reactions  . Codeine     unknown  . Meperidine Hcl     unknown  . Oxycodone-Acetaminophen     unknown    Current Outpatient Prescriptions  Medication Sig Dispense Refill  . acetaminophen (TYLENOL) 500 MG tablet Take 1 tablet  (500 mg total) by mouth every 6 (six) hours as needed for mild pain. For pain 30 tablet 0  . aspirin 81 MG tablet Take 81 mg by mouth daily.      Marland Kitchen. atorvastatin (LIPITOR) 80 MG tablet TAKE 1 TABLET DAILY 90 tablet 1  . bisoprolol (ZEBETA) 5 MG tablet Take 1 tablet (5 mg total) by mouth daily. 30 tablet 0  . Calcium Carbonate-Vitamin D (CALCIUM 600-D) 600-400 MG-UNIT per tablet Take 1 tablet by mouth daily.     . clonazePAM (KLONOPIN) 1 MG tablet TAKE 1/2 TABLET BY MOUTH EVERY MORNING, 1/2 TABLET AT 6 PM, AND 1 TABLET AT BEDTIME 60 tablet 0  . cyanocobalamin 500 MCG tablet Take 500 mcg by mouth daily.      Marland Kitchen. diltiazem (TIAZAC) 120 MG 24 hr capsule TAKE 1 CAPSULE DAILY 90 capsule 1  . furosemide (LASIX) 20 MG tablet Take 1 tablet (20 mg total) by mouth daily. 90 tablet 1  . ipratropium-albuterol (DUONEB) 0.5-2.5 (3) MG/3ML SOLN Take 3 mLs by nebulization every 6 (six) hours as needed. 360 mL 0  . levothyroxine (SYNTHROID, LEVOTHROID) 88 MCG tablet TAKE 1 TABLET DAILY 90 tablet 0  . lubiprostone (AMITIZA) 24 MCG capsule Take 1 capsule (24 mcg total) by mouth 2 (two) times daily with a meal. (Patient taking differently: Take 24 mcg by mouth daily with breakfast. ) 180 capsule 3  . mirtazapine (REMERON) 45 MG tablet TAKE 1 TABLET AT BEDTIME 90 tablet 1  . Multiple Vitamin (MULTIVITAMIN) capsule Take 1 capsule by mouth daily.      Marland Kitchen. omeprazole (PRILOSEC) 20 MG capsule TAKE 1 CAPSULE DAILY 90 capsule 0  . potassium chloride SA (K-DUR,KLOR-CON) 20 MEQ tablet Take 2 tablets (40 mEq total) by mouth once. 180 tablet 1  . QUEtiapine (SEROQUEL) 25 MG tablet TAKE 1 TABLET AT BEDTIME 90 tablet 1  . traMADol (ULTRAM) 50 MG tablet Take 1 tablet (50 mg total) by mouth every 8 (eight) hours as needed. (Patient taking differently: Take 50 mg by mouth every 8 (eight) hours as needed for moderate pain. ) 30 tablet 0  . vitamin E 400 UNIT capsule Take 400 Units by mouth daily.       No current  facility-administered  medications for this visit.     Review of Systems:     Cardiac Review of Systems: Y or N  Chest Pain [ wire painful   ]  Resting SOB [   ] Exertional SOB  [  ]  Orthopnea [  ]   Pedal Edema [   ]    Palpitations [  ] Syncope  [  ]   Presyncope [   ]  General Review of Systems: [Y] = yes [  ]=no Constitional: recent weight change Cove.Etienne  ];  Wt loss over the last 3 months [   ] anorexia [  ]; fatigue [  ]; nausea [  ]; night sweats [  ]; fever [  ]; or chills [  ];          Dental: poor dentition[  ]; Last Dentist visit:   Eye : blurred vision [  ]; diplopia [   ]; vision changes [  ];  Amaurosis fugax[  ]; Resp: cough [  ];  wheezing[  ];  hemoptysis[  ]; shortness of breath[  ]; paroxysmal nocturnal dyspnea[  ]; dyspnea on exertion[  ]; or orthopnea[  ];  GI:  gallstones[  ], vomiting[  ];  dysphagia[  ]; melena[  ];  hematochezia [  ]; heartburn[  ];   Hx of  Colonoscopy[  ]; GU: kidney stones [  ]; hematuria[  ];   dysuria [  ];  nocturia[  ];  history of     obstruction [  ]; urinary frequency [  ]             Skin: rash, swelling[  ];, hair loss[  ];  peripheral edema[  ];  or itching[  ]; Musculosketetal: myalgias[  ];  joint swelling[  ];  joint erythema[  ];  joint pain[  ];  back pain[  ];  Heme/Lymph: bruising[  ];  bleeding[  ];  anemia[  ];  Neuro: TIA[  ];  headaches[  ];  stroke[  ];  vertigo[  ];  seizures[  ];   paresthesias[  ];  difficulty walking[  ];  Psych:depression[  ]; anxiety[  ];  Endocrine: diabetes[  ];  thyroid dysfunction[  ];  Immunizations: Flu up to date [  ]; Pneumococcal up to date [  ];  Other:  Physical Exam: BP 129/72 mmHg  Pulse 74  Resp 20  Ht 5\' 4"  (1.626 m)  Wt 98 lb (44.453 kg)  BMI 16.81 kg/m2  SpO2 95%  PHYSICAL EXAMINATION: General appearance: alert, cooperative and appears stated age Head: Normocephalic, without obvious abnormality, atraumatic Neck: no adenopathy, no carotid bruit, no JVD, supple, symmetrical, trachea midline and thyroid  not enlarged, symmetric, no tenderness/mass/nodules Resp: clear to auscultation bilaterally Cardio: regular rate and rhythm, S1, S2 normal, no murmur, click, rub or gallop Extremities: extremities normal, atraumatic, no cyanosis or edema Neurologic: Grossly normal Exposed sternal wire protruding through skin Diagnostic Studies & Laboratory data:     Recent Radiology Findings:   Dg Chest 2 View  09/09/2015  CLINICAL DATA:  The patient reports a sternal wire protruding through the scan. Patient has history of CABG in 1997 EXAM: CHEST  2 VIEW COMPARISON:  Chest x-ray of April 10, 2015 FINDINGS: The lower most sternal wire projects anteriorly in the soft tissues of the lower sternum. The sternal wires are intact. The mediastinum is normal in width. The retrosternal soft tissues are normal. The  lungs are adequately inflated. The interstitial markings are chronically increased. There are small amounts of pleural fluid versus pleural thickening at the lung bases which is not new. The heart and pulmonary vascularity are normal. There is mural calcification throughout the thoracic aorta. There is tortuosity of its course. There surgical clips at the base of the neck on the right. The thoracic vertebral bodies are preserved in height. IMPRESSION: 1. The lower most sternal wire projects into the soft tissues anteriorly and is likely the wire visible on physical exam. 2. COPD and chronic pleural parenchymal scarring. Possible small but stable bilateral pleural effusions. 3. No evidence of pulmonary edema. Electronically Signed   By: David  [place] M.D.   On: 09/09/2015 11:25     I have independently reviewed the above radiologic studies.  Recent Lab Findings: Lab Results  Component Value Date   WBC 5.7 04/08/2015   HGB 13.1 04/08/2015   HCT 41.8 04/08/2015   PLT 117* 04/08/2015   GLUCOSE 99 04/19/2015   CHOL 131 12/03/2014   TRIG 46.0 12/03/2014   HDL 53.90 12/03/2014   LDLDIRECT 84.4 10/07/2009    LDLCALC 68 12/03/2014   ALT 24 04/08/2015   AST 38 04/08/2015   NA 135 04/19/2015   K 4.8 04/19/2015   CL 96* 04/19/2015   CREATININE 1.31* 04/19/2015   BUN 18 04/19/2015   CO2 34* 04/19/2015   TSH 2.025 01/23/2015   INR 1.30 01/23/2015   HGBA1C 6.0* 01/23/2015      Assessment / Plan:   exposed sternal wire, with patients permission wire  was removed . He sternum was preped with betadine , 1% liodacaine was infiltrated, wire untwisted , cut and remove without difficulity      I  spent 30 minutes counseling the patient face to face and 50% or more the  time was spent in counseling and coordination of care. The total time spent in the appointment was 40 minutes.  Delight Ovens MD      [address] [place] [place] [address] Office [phone]   Beeper [phone]  09/09/2015 5:48 PM

## 2015-09-13 ENCOUNTER — Other Ambulatory Visit: Payer: Self-pay | Admitting: Internal Medicine

## 2015-09-17 ENCOUNTER — Other Ambulatory Visit: Payer: Self-pay | Admitting: Internal Medicine

## 2015-09-17 NOTE — Telephone Encounter (Signed)
Last filled 08/19/2015--please advise if okay to call in 09/19/2015

## 2015-09-17 NOTE — Telephone Encounter (Signed)
Ok to phone in Clonazepam 

## 2015-09-18 NOTE — Telephone Encounter (Signed)
Rx called in to pharmacy. 

## 2015-09-29 ENCOUNTER — Other Ambulatory Visit: Payer: Self-pay | Admitting: Internal Medicine

## 2015-09-29 ENCOUNTER — Other Ambulatory Visit: Payer: Self-pay | Admitting: Cardiovascular Disease

## 2015-09-30 NOTE — Telephone Encounter (Signed)
Rx(s) sent to pharmacy electronically.  

## 2015-09-30 NOTE — Telephone Encounter (Signed)
Ok to continue

## 2015-09-30 NOTE — Telephone Encounter (Signed)
Last filled 12/16 #90 with 1 refill--please advise if okay for pt to continue

## 2015-10-05 ENCOUNTER — Other Ambulatory Visit: Payer: Self-pay | Admitting: Internal Medicine

## 2015-10-11 ENCOUNTER — Other Ambulatory Visit: Payer: Self-pay | Admitting: Internal Medicine

## 2015-10-17 ENCOUNTER — Other Ambulatory Visit: Payer: Self-pay

## 2015-10-17 MED ORDER — CLONAZEPAM 1 MG PO TABS: ORAL_TABLET | ORAL | Status: DC

## 2015-10-17 NOTE — Telephone Encounter (Signed)
Ok to phone in Clonazepam 

## 2015-10-17 NOTE — Telephone Encounter (Signed)
[name] pts daughter left v/m (DPR signed) requesting refill clonazepam to [facility]. Request cb when refilled. Pt will be out of med on 10/19/15. Last refilled # 60 on 09/17/15. Last seen 06/10/15.

## 2015-10-18 MED ORDER — CLONAZEPAM 1 MG PO TABS: ORAL_TABLET | ORAL | Status: DC

## 2015-10-18 NOTE — Telephone Encounter (Signed)
[name] request status of clonazepam refill; advised already called to pharmacy and [name] voiced understanding.

## 2015-10-18 NOTE — Telephone Encounter (Signed)
Yes, what you have written is correct.

## 2015-10-18 NOTE — Addendum Note (Signed)
Addended by: Roena MaladyEVONTENNO, Verlin Uher Y on: 10/18/2015 11:07 AM   Modules accepted: Orders

## 2015-10-18 NOTE — Telephone Encounter (Signed)
I just wanted to make sure sig is correct.... The last to refills I think were in error---I saw some previous Rx state she takes 1/2 tab in AM, 1/2 tab afternoon and 1 tab at bedtime--please advise if the new sig is correct and quantity of 60---just want to confirm

## 2015-10-18 NOTE — Telephone Encounter (Signed)
Rx called in to pharmacy. 

## 2015-10-31 ENCOUNTER — Other Ambulatory Visit: Payer: Self-pay | Admitting: Internal Medicine

## 2015-11-15 ENCOUNTER — Other Ambulatory Visit: Payer: Self-pay | Admitting: Internal Medicine

## 2015-11-15 NOTE — Telephone Encounter (Signed)
Rx called in to pharmacy. 

## 2015-11-15 NOTE — Telephone Encounter (Signed)
Ok to phone in Clonazepam 

## 2015-11-15 NOTE — Telephone Encounter (Signed)
Last filled 10/18/2015--ok to call in to be filled on or after 11/17/2014?--please advise

## 2015-11-18 NOTE — Telephone Encounter (Signed)
[name] (DPR signed) left v/m requesting cb with what happened with clonazepam refill; refill was called in to [facility] on 11/15/15 but could not be filled until 11/17/15 and pharmacist said put on hold waiting for some one to call and request refill; I requested [facility] to get ready for pick up and pharmacist voiced understanding. [name] notified with this info and voiced understanding and will ck with pharmacy later today for pick up.

## 2015-11-20 ENCOUNTER — Ambulatory Visit (INDEPENDENT_AMBULATORY_CARE_PROVIDER_SITE_OTHER): Payer: Medicare Other | Admitting: Internal Medicine

## 2015-11-20 ENCOUNTER — Encounter: Payer: Self-pay | Admitting: Internal Medicine

## 2015-11-20 VITALS — BP 122/62 | HR 75 | Temp 97.7°F | Wt 106.0 lb

## 2015-11-20 DIAGNOSIS — I482 Chronic atrial fibrillation, unspecified: Secondary | ICD-10-CM

## 2015-11-20 DIAGNOSIS — K5900 Constipation, unspecified: Secondary | ICD-10-CM | POA: Insufficient documentation

## 2015-11-20 DIAGNOSIS — E785 Hyperlipidemia, unspecified: Secondary | ICD-10-CM

## 2015-11-20 DIAGNOSIS — I739 Peripheral vascular disease, unspecified: Secondary | ICD-10-CM

## 2015-11-20 DIAGNOSIS — I2510 Atherosclerotic heart disease of native coronary artery without angina pectoris: Secondary | ICD-10-CM

## 2015-11-20 DIAGNOSIS — E039 Hypothyroidism, unspecified: Secondary | ICD-10-CM

## 2015-11-20 DIAGNOSIS — F411 Generalized anxiety disorder: Secondary | ICD-10-CM

## 2015-11-20 DIAGNOSIS — G4700 Insomnia, unspecified: Secondary | ICD-10-CM

## 2015-11-20 DIAGNOSIS — I10 Essential (primary) hypertension: Secondary | ICD-10-CM

## 2015-11-20 DIAGNOSIS — I779 Disorder of arteries and arterioles, unspecified: Secondary | ICD-10-CM

## 2015-11-20 DIAGNOSIS — M546 Pain in thoracic spine: Secondary | ICD-10-CM | POA: Insufficient documentation

## 2015-11-20 DIAGNOSIS — K2270 Barrett's esophagus without dysplasia: Secondary | ICD-10-CM

## 2015-11-20 DIAGNOSIS — I5032 Chronic diastolic (congestive) heart failure: Secondary | ICD-10-CM

## 2015-11-20 NOTE — Assessment & Plan Note (Addendum)
Well controlled off meds Will continue to monitor CBC, CMP today

## 2015-11-20 NOTE — Assessment & Plan Note (Signed)
Well controlled on Klonopin Call if symptoms worsen

## 2015-11-20 NOTE — Assessment & Plan Note (Signed)
Continue Lipitor daily Will check lipid panel today

## 2015-11-20 NOTE — Assessment & Plan Note (Addendum)
Miralax daily Call if symptoms worsen or do not resolve

## 2015-11-20 NOTE — Assessment & Plan Note (Signed)
Tramadol as needed for pain Call if symptoms worsen or do not resolve

## 2015-11-20 NOTE — Patient Instructions (Signed)
Fall Prevention in the Home  Falls can cause injuries and can affect people from all age groups. There are many simple things that you can do to make your home safe and to help prevent falls. WHAT CAN I DO ON THE OUTSIDE OF MY HOME?  Regularly repair the edges of walkways and driveways and fix any cracks.  Remove high doorway thresholds.  Trim any shrubbery on the main path into your home.  Use bright outdoor lighting.  Clear walkways of debris and clutter, including tools and rocks.  Regularly check that handrails are securely fastened and in good repair. Both sides of any steps should have handrails.  Install guardrails along the edges of any raised decks or porches.  Have leaves, snow, and ice cleared regularly.  Use sand or salt on walkways during winter months.  In the garage, clean up any spills right away, including grease or oil spills. WHAT CAN I DO IN THE BATHROOM?  Use night lights.  Install grab bars by the toilet and in the tub and shower. Do not use towel bars as grab bars.  Use non-skid mats or decals on the floor of the tub or shower.  If you need to sit down while you are in the shower, use a plastic, non-slip stool..  Keep the floor dry. Immediately clean up any water that spills on the floor.  Remove soap buildup in the tub or shower on a regular basis.  Attach bath mats securely with double-sided non-slip rug tape.  Remove throw rugs and other tripping hazards from the floor. WHAT CAN I DO IN THE BEDROOM?  Use night lights.  Make sure that a bedside light is easy to reach.  Do not use oversized bedding that drapes onto the floor.  Have a firm chair that has side arms to use for getting dressed.  Remove throw rugs and other tripping hazards from the floor. WHAT CAN I DO IN THE KITCHEN?   Clean up any spills right away.  Avoid walking on wet floors.  Place frequently used items in easy-to-reach places.  If you need to reach for something  above you, use a sturdy step stool that has a grab bar.  Keep electrical cables out of the way.  Do not use floor polish or wax that makes floors slippery. If you have to use wax, make sure that it is non-skid floor wax.  Remove throw rugs and other tripping hazards from the floor. WHAT CAN I DO IN THE STAIRWAYS?  Do not leave any items on the stairs.  Make sure that there are handrails on both sides of the stairs. Fix handrails that are broken or loose. Make sure that handrails are as long as the stairways.  Check any carpeting to make sure that it is firmly attached to the stairs. Fix any carpet that is loose or worn.  Avoid having throw rugs at the top or bottom of stairways, or secure the rugs with carpet tape to prevent them from moving.  Make sure that you have a light switch at the top of the stairs and the bottom of the stairs. If you do not have them, have them installed. WHAT ARE SOME OTHER FALL PREVENTION TIPS?  Wear closed-toe shoes that fit well and support your feet. Wear shoes that have rubber soles or low heels.  When you use a stepladder, make sure that it is completely opened and that the sides are firmly locked. Have someone hold the ladder while you   are using it. Do not climb a closed stepladder.  Add color or contrast paint or tape to grab bars and handrails in your home. Place contrasting color strips on the first and last steps.  Use mobility aids as needed, such as canes, walkers, scooters, and crutches.  Turn on lights if it is dark. Replace any light bulbs that burn out.  Set up furniture so that there are clear paths. Keep the furniture in the same spot.  Fix any uneven floor surfaces.  Choose a carpet design that does not hide the edge of steps of a stairway.  Be aware of any and all pets.  Review your medicines with your healthcare provider. Some medicines can cause dizziness or changes in blood pressure, which increase your risk of falling. Talk  with your health care provider about other ways that you can decrease your risk of falls. This may include working with a physical therapist or trainer to improve your strength, balance, and endurance.   This information is not intended to replace advice given to you by your health care provider. Make sure you discuss any questions you have with your health care provider.   Document Released: 03/27/2002 Document Revised: 08/21/2014 Document Reviewed: 05/11/2014 Elsevier Interactive Patient Education 2016 [employer]  

## 2015-11-20 NOTE — Assessment & Plan Note (Signed)
Continue Prilosec

## 2015-11-20 NOTE — Assessment & Plan Note (Signed)
Continue Remeron, Klonopin, and Seroquel daily

## 2015-11-20 NOTE — Assessment & Plan Note (Addendum)
Continue Bisoprolol, ASA and Diltiazem daily

## 2015-11-20 NOTE — Assessment & Plan Note (Addendum)
Continue Lasix daily CMET today

## 2015-11-20 NOTE — Assessment & Plan Note (Signed)
Continue Synthroid daily.    

## 2015-11-20 NOTE — Progress Notes (Addendum)
Subjective:    Patient ID: Jasmine [name], female    DOB: 1922/11/07, 82 y.o.   MRN: [id number]  HPI  Pt presents to the clinic today for 6 month follow up of chronic conditions.  Insomnia: She reports she usually sleeps okay and will typically get 8 hours of sleep a night but will occasionally have nights when she cannot fall asleep.  She takes Remeron, Klonipin and Seroquel nightly. She does not feel sleepy during the day.  Anxiety: She takes Klonopin three times per day. It seems to control her anxiety very well.  Her daughter reports the medication really helps her.  Barretts Esophagus: She denies s/s of reflux on Prilosec.  CAD/HLD: Her last LDL was 68. She takes the Lipitor daily.  She denies myalgias or chest pain. Cardiology note from 12/2014 reviewed.  CHF: She is taking Lasix daily. Echo from 01/2015 reviewed. EF > 55%. She continues to use oxygen.  HTN: BP well controlled off meds. She denies dizziness, headaches, or palpitations.  Her BP today is 122/62.  Hypothyroidism: She is taking Synthroid daily.  She denies weight changes or dry skin.  She reports she is always cold but this is not new.  PAF: She takes Bisoprolol and Diltiazem daily as prescribed.    Constipation: She reports 2-3 days between bowel movements, and admits to straining with bowel movements.  She is no longer taking the Amitiza due to having troubles making it to the bathroom in time.   She complains of back pain x 2 days.  She reports the pain is in the middle of the upper back and has been constant.  She has not tried anything for it but reports she does have Tramadol at home.  Her daughter states intermittent back pain has been an ongoing issue for a while.        Review of Systems  Psychiatric/Behavioral: The patient has insomnia.         Past Medical History:  Diagnosis Date  . Anxiety   . Atrial fibrillation with RVR (HCC)   . Barrett's esophagus   . CAD (coronary artery disease) of artery  bypass graft 1995  . Fracture of right shoulder 02/2008  . GERD (gastroesophageal reflux disease)   . Hemorrhoids, external   . Hiatal hernia   . History of colon polyps   . Hyperlipidemia   . Hypertension   . Hypothyroidism   . Insomnia due to medical condition   . Left wrist fracture 09/2007  . Low back pain   . Syncope     Current Outpatient Prescriptions  Medication Sig Dispense Refill  . acetaminophen (TYLENOL) 500 MG tablet Take 1 tablet (500 mg total) by mouth every 6 (six) hours as needed for mild pain. For pain 30 tablet 0  . aspirin 81 MG tablet Take 81 mg by mouth daily.      Marland Kitchen atorvastatin (LIPITOR) 80 MG tablet TAKE 1 TABLET DAILY 90 tablet 1  . bisoprolol (ZEBETA) 10 MG tablet TAKE 1 TABLET DAILY 90 tablet 0  . Calcium Carbonate-Vitamin D (CALCIUM 600-D) 600-400 MG-UNIT per tablet Take 1 tablet by mouth daily.     . clonazePAM (KLONOPIN) 1 MG tablet TAKE 1/2 TABLET BY MOUTH IN THE MORNING, 1/2 TABLET BY MOUTH AT 6PM AND 1 TABLET BY MOUTH AT BEDTIME DAILY 60 tablet 0  . cyanocobalamin 500 MCG tablet Take 500 mcg by mouth daily.      Marland Kitchen diltiazem (TIAZAC) 120 MG 24 hr capsule  Take 1 capsule (120 mg total) by mouth daily. <PLEASE MAKE APPOINTMENT FOR REFILLS> 90 capsule 0  . furosemide (LASIX) 20 MG tablet Take 1 tablet (20 mg total) by mouth daily. MUST SCHEDULE ANNUAL EXAM 90 tablet 0  . ipratropium-albuterol (DUONEB) 0.5-2.5 (3) MG/3ML SOLN Take 3 mLs by nebulization every 6 (six) hours as needed. 360 mL 0  . KLOR-CON M20 20 MEQ tablet TAKE 2 TABLETS DAILY 180 tablet 0  . levothyroxine (SYNTHROID, LEVOTHROID) 88 MCG tablet TAKE 1 TABLET DAILY 90 tablet 0  . lubiprostone (AMITIZA) 24 MCG capsule Take 1 capsule (24 mcg total) by mouth 2 (two) times daily with a meal. (Patient taking differently: Take 24 mcg by mouth daily with breakfast. ) 180 capsule 3  . mirtazapine (REMERON) 45 MG tablet TAKE 1 TABLET AT BEDTIME 90 tablet 0  . Multiple Vitamin (MULTIVITAMIN) capsule Take 1  capsule by mouth daily.      Marland Kitchen omeprazole (PRILOSEC) 20 MG capsule TAKE 1 CAPSULE DAILY 90 capsule 0  . QUEtiapine (SEROQUEL) 25 MG tablet TAKE 1 TABLET AT BEDTIME 90 tablet 1  . traMADol (ULTRAM) 50 MG tablet Take 1 tablet (50 mg total) by mouth every 8 (eight) hours as needed. (Patient taking differently: Take 50 mg by mouth every 8 (eight) hours as needed for moderate pain. ) 30 tablet 0  . vitamin E 400 UNIT capsule Take 400 Units by mouth daily.       No current facility-administered medications for this visit.     Allergies  Allergen Reactions  . Codeine     unknown  . Meperidine Hcl     unknown  . Oxycodone-Acetaminophen     unknown    Family History  Problem Relation Age of Onset  . Diabetes Son   . Heart disease Mother   . Heart disease Father     Social History   Social History  . Marital status: Widowed    Spouse name: N/A  . Number of children: N/A  . Years of education: N/A   Occupational History  . Not on file.   Social History Main Topics  . Smoking status: Former Smoker    Quit date: 04/20/1968  . Smokeless tobacco: Never Used     Comment: QUIT IN 1970  . Alcohol use No     Comment: STOPPED IN 1970  . Drug use: No  . Sexual activity: Not Currently   Other Topics Concern  . Not on file   Social History Narrative  . No narrative on file     Constitutional: Denies dizziness, headaches, or abrupt weight changes.  Respiratory: Denies shortness of breath.   Cardiovascular: Denies chest pain, palpitations or swelling in the hands or feet.  Gastrointestinal:  Pt reports constipation.  Denies abdominal pain.  Musculoskeletal: Pt reports back pain.  Denies muscle pain. Psych: Pt reports history of anxiety.  No other specific complaints in a complete review of systems (except as listed in HPI above).  Objective:   Physical Exam  BP 122/62   Pulse 75   Temp 97.7 F (36.5 C) (Oral)   Wt 106 lb (48.1 kg)   SpO2 98%   BMI 18.19 kg/m    Wt  Readings from Last 3 Encounters:  11/20/15 106 lb (48.1 kg)  09/09/15 98 lb (44.5 kg)  06/10/15 98 lb (44.5 kg)    General: Appears her stated age, chronically ill appearing, in NAD. Skin: Warm, dry and intact. Seborrheic keratosis noted on left cheek.  Cardiovascular: Normal rate with irregular rhythm. S1,S2 noted.  No murmur, rubs or gallops noted. No JVD or BLE edema. No carotid bruits noted. Pulmonary/Chest: Normal effort and positive vesicular breath sounds. No respiratory distress. No wheezes, rales or ronchi noted.  Abdomen: Soft, nontender, nondistended. Normal bowel sounds. MSK:  Spine and paraspinals nontender. Mild kyphosis noted.  Full flexion, rotation, and lateral flexion of cervical spine, decreased extension.  Full AROM of bilateral shoulders. Neurological: Alert and oriented.  Psychiatric: Mood and affect normal.   BMET    Component Value Date/Time   NA 135 04/19/2015 1624   K 4.8 04/19/2015 1624   CL 96 (L) 04/19/2015 1624   CO2 34 (H) 04/19/2015 1624   GLUCOSE 99 04/19/2015 1624   BUN 18 04/19/2015 1624   CREATININE 1.31 (H) 04/19/2015 1624   CALCIUM 8.9 04/19/2015 1624   GFRNONAA 55 (L) 04/10/2015 0447   GFRAA >60 04/10/2015 0447    Lipid Panel     Component Value Date/Time   CHOL 131 12/03/2014 1655   TRIG 46.0 12/03/2014 1655   HDL 53.90 12/03/2014 1655   CHOLHDL 2 12/03/2014 1655   VLDL 9.2 12/03/2014 1655   LDLCALC 68 12/03/2014 1655    CBC    Component Value Date/Time   WBC 5.7 04/08/2015 0336   RBC 4.39 04/08/2015 0336   HGB 13.1 04/08/2015 0336   HCT 41.8 04/08/2015 0336   PLT 117 (L) 04/08/2015 0336   MCV 95.2 04/08/2015 0336   MCH 29.8 04/08/2015 0336   MCHC 31.3 04/08/2015 0336   RDW 15.3 04/08/2015 0336   LYMPHSABS 1.3 04/07/2015 1706   MONOABS 0.6 04/07/2015 1706   EOSABS 0.1 04/07/2015 1706   BASOSABS 0.0 04/07/2015 1706    Hgb A1C Lab Results  Component Value Date   HGBA1C 6.0 (H) 01/23/2015         Assessment &  Plan:     RTC in 6 months for Medicare Wellness exam

## 2015-11-20 NOTE — Assessment & Plan Note (Addendum)
Continue Lipitor and ASA daily CMET today

## 2015-11-21 LAB — COMPREHENSIVE METABOLIC PANEL
ALBUMIN: 3.9 g/dL (ref 3.5–5.2)
ALT: 24 U/L (ref 0–35)
AST: 35 U/L (ref 0–37)
Alkaline Phosphatase: 116 U/L (ref 39–117)
BUN: 17 mg/dL (ref 6–23)
CHLORIDE: 96 meq/L (ref 96–112)
CO2: 34 meq/L — AB (ref 19–32)
CREATININE: 1.06 mg/dL (ref 0.40–1.20)
Calcium: 9.4 mg/dL (ref 8.4–10.5)
GFR: 51.62 mL/min — ABNORMAL LOW (ref >60.00)
GLUCOSE: 100 mg/dL — AB (ref 70–99)
Potassium: 5.3 mEq/L — ABNORMAL HIGH (ref 3.5–5.1)
SODIUM: 134 meq/L — AB (ref 135–145)
Total Bilirubin: 0.7 mg/dL (ref 0.2–1.2)
Total Protein: 7 g/dL (ref 6.0–8.3)

## 2015-11-21 LAB — CBC
HCT: 40.3 % (ref 36.0–46.0)
Hemoglobin: 13.3 g/dL (ref 12.0–15.0)
MCHC: 33 g/dL (ref 30.0–36.0)
MCV: 92 fl (ref 78.0–100.0)
Platelets: 113 10*3/uL — ABNORMAL LOW (ref 150.0–400.0)
RBC: 4.38 Mil/uL (ref 3.87–5.11)
RDW: 15.8 % — AB (ref 11.5–15.5)
WBC: 5.3 10*3/uL (ref 4.0–10.5)

## 2015-11-21 LAB — LIPID PANEL
CHOL/HDL RATIO: 2
CHOLESTEROL: 120 mg/dL (ref 0–200)
HDL: 58.9 mg/dL (ref >39.00)
LDL CALC: 51 mg/dL (ref 0–99)
NONHDL: 61.55
Triglycerides: 53 mg/dL (ref 0.0–149.0)
VLDL: 10.6 mg/dL (ref 0.0–40.0)

## 2015-12-06 ENCOUNTER — Other Ambulatory Visit: Payer: Self-pay | Admitting: Internal Medicine

## 2015-12-12 ENCOUNTER — Other Ambulatory Visit: Payer: Self-pay | Admitting: Internal Medicine

## 2015-12-13 NOTE — Telephone Encounter (Signed)
pts daughter left vm requesting status of clonazepam refill; pt will be out of med on 12/15/15.

## 2015-12-13 NOTE — Telephone Encounter (Signed)
Ok to phone in Clonazepam 

## 2015-12-13 NOTE — Telephone Encounter (Signed)
Rx called in to pharmacy. 

## 2015-12-13 NOTE — Telephone Encounter (Signed)
Last filled 11/15/15--please advise 

## 2015-12-17 ENCOUNTER — Other Ambulatory Visit: Payer: Self-pay | Admitting: Internal Medicine

## 2015-12-29 ENCOUNTER — Other Ambulatory Visit: Payer: Self-pay | Admitting: Internal Medicine

## 2015-12-29 ENCOUNTER — Other Ambulatory Visit: Payer: Self-pay | Admitting: Cardiovascular Disease

## 2015-12-30 NOTE — Telephone Encounter (Signed)
Rx(s) sent to pharmacy electronically.  

## 2016-01-05 ENCOUNTER — Other Ambulatory Visit: Payer: Self-pay | Admitting: Internal Medicine

## 2016-01-07 ENCOUNTER — Other Ambulatory Visit: Payer: Self-pay | Admitting: Internal Medicine

## 2016-01-09 ENCOUNTER — Other Ambulatory Visit: Payer: Self-pay | Admitting: Internal Medicine

## 2016-01-15 ENCOUNTER — Other Ambulatory Visit: Payer: Self-pay | Admitting: Internal Medicine

## 2016-01-17 ENCOUNTER — Other Ambulatory Visit: Payer: Self-pay

## 2016-01-17 MED ORDER — CLONAZEPAM 1 MG PO TABS: ORAL_TABLET | ORAL | 0 refills | Status: DC

## 2016-01-17 NOTE — Telephone Encounter (Signed)
Rx called in to pharmacy. 

## 2016-01-17 NOTE — Telephone Encounter (Signed)
Ok to phone in Clonazepam 

## 2016-01-17 NOTE — Telephone Encounter (Signed)
Patient's daughter called upset, stating that she called 3 days ago to request refill and her mother will be out of clonazepam today.  I do not see where a prior call was received or any authorization requested by pharmacy.  Daughter requests script to be called in asap and please notify her once this has been done.  Last filled #60 on 12/13/15 and last seen on 11/20/15 by R.Baity, NP.

## 2016-01-29 ENCOUNTER — Encounter: Payer: Self-pay | Admitting: Cardiovascular Disease

## 2016-01-29 ENCOUNTER — Ambulatory Visit (INDEPENDENT_AMBULATORY_CARE_PROVIDER_SITE_OTHER): Payer: Medicare Other | Admitting: Cardiovascular Disease

## 2016-01-29 VITALS — BP 108/74 | HR 71 | Ht 64.0 in | Wt 105.0 lb

## 2016-01-29 DIAGNOSIS — I10 Essential (primary) hypertension: Secondary | ICD-10-CM

## 2016-01-29 DIAGNOSIS — I739 Peripheral vascular disease, unspecified: Secondary | ICD-10-CM

## 2016-01-29 DIAGNOSIS — I2510 Atherosclerotic heart disease of native coronary artery without angina pectoris: Secondary | ICD-10-CM

## 2016-01-29 DIAGNOSIS — I779 Disorder of arteries and arterioles, unspecified: Secondary | ICD-10-CM

## 2016-01-29 DIAGNOSIS — I482 Chronic atrial fibrillation, unspecified: Secondary | ICD-10-CM

## 2016-01-29 DIAGNOSIS — E7800 Pure hypercholesterolemia, unspecified: Secondary | ICD-10-CM

## 2016-01-29 NOTE — Progress Notes (Signed)
01/29/2016 Jasmine [name]   Apr 06, 1923  [id number]  Primary Physician Nicki ReaperBAITY, REGINA, NP Primary Cardiologist: Runell GessJonathan J Jerimah Witucki MD Roseanne RenoFACP, FACC, FAHA, FSCAI  HPI:  The patient is an 82 year old, thin and frail-appearing, widowed Caucasian female, mother of 3, grandmother to [id number] grandchildren who is accompanied by one of her daughters today. I saw her in the office 03/22/14.Marland Kitchen. She has a history of CAD and PVOD. She had coronary artery bypass grafting in 1997 as well as carotid endarterectomy at that time. I catheterized her December 09, 2003, revealing patent grafts and normal LV function. Her other problems include hypertension and hyperlipidemia. She developed A-fib with RVR and we have been adjusting her medications. I put her on Xarelto for stroke prophylaxis and she spontaneously converted to sinus rhythm. Her last stress test performed April 03, 2011, was unremarkable. She is otherwise asymptomatic except for dizziness.. The Xarelto was discontinued because of fall risk. Since I saw her in the office 2 years ago she's remained clinically stable.  Current Outpatient Prescriptions  Medication Sig Dispense Refill  . acetaminophen (TYLENOL) 500 MG tablet Take 1 tablet (500 mg total) by mouth every 6 (six) hours as needed for mild pain. For pain 30 tablet 0  . aspirin 81 MG tablet Take 81 mg by mouth daily.      Marland Kitchen. atorvastatin (LIPITOR) 80 MG tablet TAKE 1 TABLET DAILY 90 tablet 1  . bisoprolol (ZEBETA) 10 MG tablet TAKE 1 TABLET DAILY 90 tablet 0  . Calcium Carbonate-Vitamin D (CALCIUM 600-D) 600-400 MG-UNIT per tablet Take 1 tablet by mouth daily.     . clonazePAM (KLONOPIN) 1 MG tablet TAKE 1/2 TABLET BY MOUTH IN THE MORNING, 1/2 TABLET AT 6 PM AND 1 TABLET AT BEDTIME 60 tablet 0  . cyanocobalamin 500 MCG tablet Take 500 mcg by mouth daily.      Marland Kitchen. diltiazem (TIAZAC) 120 MG 24 hr capsule Take 1 capsule (120 mg total) by mouth daily. FINAL REFILL UNLESS PATIENT CONTACTS OFFICE 90 capsule 0  .  furosemide (LASIX) 20 MG tablet Take 1 tablet (20 mg total) by mouth daily. MUST SCHEDULE ANNUAL EXAM 90 tablet 0  . ipratropium-albuterol (DUONEB) 0.5-2.5 (3) MG/3ML SOLN Take 3 mLs by nebulization every 6 (six) hours as needed. 360 mL 0  . KLOR-CON M20 20 MEQ tablet TAKE 2 TABLETS DAILY 180 tablet 0  . levothyroxine (SYNTHROID, LEVOTHROID) 88 MCG tablet TAKE 1 TABLET DAILY 90 tablet 1  . lubiprostone (AMITIZA) 24 MCG capsule Take 1 capsule (24 mcg total) by mouth 2 (two) times daily with a meal. (Patient taking differently: Take 24 mcg by mouth daily with breakfast. ) 180 capsule 3  . mirtazapine (REMERON) 45 MG tablet TAKE 1 TABLET AT BEDTIME 90 tablet 1  . Multiple Vitamin (MULTIVITAMIN) capsule Take 1 capsule by mouth daily.      Marland Kitchen. omeprazole (PRILOSEC) 20 MG capsule TAKE 1 CAPSULE DAILY 90 capsule 3  . QUEtiapine (SEROQUEL) 25 MG tablet TAKE 1 TABLET AT BEDTIME 90 tablet 1  . traMADol (ULTRAM) 50 MG tablet Take 1 tablet (50 mg total) by mouth every 8 (eight) hours as needed. (Patient taking differently: Take 50 mg by mouth every 8 (eight) hours as needed for moderate pain. ) 30 tablet 0  . vitamin E 400 UNIT capsule Take 400 Units by mouth daily.       No current facility-administered medications for this visit.     Allergies  Allergen Reactions  . Codeine  unknown  . Meperidine Hcl     unknown  . Oxycodone-Acetaminophen     unknown    Social History   Social History  . Marital status: Widowed    Spouse name: N/A  . Number of children: N/A  . Years of education: N/A   Occupational History  . Not on file.   Social History Main Topics  . Smoking status: Former Smoker    Quit date: 04/20/1968  . Smokeless tobacco: Never Used     Comment: QUIT IN 1970  . Alcohol use No     Comment: STOPPED IN 1970  . Drug use: No  . Sexual activity: Not Currently   Other Topics Concern  . Not on file   Social History Narrative  . No narrative on file     Review of  Systems: General: negative for chills, fever, night sweats or weight changes.  Cardiovascular: negative for chest pain, dyspnea on exertion, edema, orthopnea, palpitations, paroxysmal nocturnal dyspnea or shortness of breath Dermatological: negative for rash Respiratory: negative for cough or wheezing Urologic: negative for hematuria Abdominal: negative for nausea, vomiting, diarrhea, bright red blood per rectum, melena, or hematemesis Neurologic: negative for visual changes, syncope, or dizziness All other systems reviewed and are otherwise negative except as noted above.    Blood pressure 108/74, pulse 71, height 5\' 4"  (1.626 m), weight 105 lb (47.6 kg).  General appearance: alert and no distress Neck: no adenopathy, no carotid bruit, no JVD, supple, symmetrical, trachea midline and thyroid not enlarged, symmetric, no tenderness/mass/nodules Lungs: clear to auscultation bilaterally Heart: irregularly irregular rhythm Extremities: extremities normal, atraumatic, no cyanosis or edema  EKG atrial fibrillation with a ventricular response of 71 with septal Q waves. Per se reviewed this EKG.  ASSESSMENT AND PLAN:   HLD (hyperlipidemia) History of hyperlipidemia on statin therapy with recent lipid profile performed 11/20/15 revealing a total cholesterol 120,  LDL 51 and HDL of 59.  CAD - CABG '97, cath OK 2005, Myoview low risk 12/12 History of coronary artery disease status post bypass grafting in 1997. I catheterized her in 04/09/04 revealing patent grafts with normal LV function. She has stress test performed 04/03/11 which was unremarkable. She denies chest pain or shortness of breath.  HTN (hypertension) History of hypertension blood pressure measured at 108/74 She is on diltiazem. Continue current meds at current dosing  Chronic atrial fibrillation (HCC) History of chronic atrial fibrillation rate controlled currently not on oral anticoagulation because of fall risk.  Carotid artery  disease (HCC) History of carotid artery disease status post endarterectomy of the time of her bypass surgery 1997. Her last carotid Dopplers performed 05/15/10 showed no evidence of IC stenosis. I cannot auscultate a bruit today.      Runell Gess MD FACP,FACC,FAHA, [facility] 01/29/2016 3:07 PM

## 2016-01-29 NOTE — Assessment & Plan Note (Signed)
History of carotid artery disease status post endarterectomy of the time of her bypass surgery 1997. Her last carotid Dopplers performed 05/15/10 showed no evidence of IC stenosis. I cannot auscultate a bruit today.

## 2016-01-29 NOTE — Assessment & Plan Note (Signed)
History of hypertension blood pressure measured at 108/74 She is on diltiazem. Continue current meds at current dosing

## 2016-01-29 NOTE — Assessment & Plan Note (Addendum)
History of chronic atrial fibrillation rate controlled currently not on oral anticoagulation because of fall risk.

## 2016-01-29 NOTE — Assessment & Plan Note (Signed)
History of hyperlipidemia on statin therapy with recent lipid profile performed 11/20/15 revealing a total cholesterol 120,  LDL 51 and HDL of 59.

## 2016-01-29 NOTE — Patient Instructions (Signed)
Medication Instructions:  NO CHANGES.   Follow-Up: Your physician wants you to follow-up in: 12 MONTHS WITH DR BERRY.  You will receive a reminder letter in the mail two months in advance. If you don't receive a letter, please call our office to schedule the follow-up appointment.   If you need a refill on your cardiac medications before your next appointment, please call your pharmacy.   

## 2016-01-29 NOTE — Assessment & Plan Note (Signed)
History of coronary artery disease status post bypass grafting in 1997. I catheterized her in 04/09/04 revealing patent grafts with normal LV function. She has stress test performed 04/03/11 which was unremarkable. She denies chest pain or shortness of breath.

## 2016-01-30 ENCOUNTER — Other Ambulatory Visit: Payer: Self-pay | Admitting: Internal Medicine

## 2016-02-13 ENCOUNTER — Telehealth: Payer: Self-pay | Admitting: Internal Medicine

## 2016-02-13 NOTE — Telephone Encounter (Signed)
Last filled 01/17/16...please advise 

## 2016-02-14 NOTE — Telephone Encounter (Signed)
Ok to phone in Clonazepam to be filled on or after 10/29  *please change class to "phone in"

## 2016-02-14 NOTE — Telephone Encounter (Signed)
Daughter called Triage to check the status of this refill, I advise daughter that refills can take 48-72 hrs but we will get it sent in and to check with the pharmacy later to see when it will be ready, please phone in Rx when you can

## 2016-02-14 NOTE — Telephone Encounter (Signed)
Rx called in to pharmacy. 

## 2016-02-17 NOTE — Telephone Encounter (Signed)
Jasmine [name] called [facility] on [address] [place] and [place]. She is requesting a cb to discuss status of Clonazepam.

## 2016-02-18 NOTE — Telephone Encounter (Signed)
Spoke to pts daughter yesterday

## 2016-03-16 ENCOUNTER — Other Ambulatory Visit: Payer: Self-pay | Admitting: Internal Medicine

## 2016-03-16 NOTE — Telephone Encounter (Signed)
Rx called in to pharmacy. 

## 2016-03-16 NOTE — Telephone Encounter (Signed)
Ok to phone in Clonazepam.  *please change class to "phone in" 

## 2016-03-16 NOTE — Telephone Encounter (Signed)
Last filled 02/14/2016.Marland Kitchen.Marland Kitchen.please advise

## 2016-03-24 ENCOUNTER — Telehealth: Payer: Self-pay

## 2016-03-24 NOTE — Telephone Encounter (Signed)
Jasmine [name] with [facility] left v/m; Shanda BumpsJessica said the 11/20/15 notes are very good but wants to know if addendum could be done to that visit notes to state pt is still using oxygen. Shanda BumpsJessica is working on getting pts oxygen recertified. Jasmine [name]'s fax # is [phone].

## 2016-03-24 NOTE — Telephone Encounter (Signed)
Done, does she need copy of note?

## 2016-03-25 NOTE — Telephone Encounter (Signed)
Left message on voicemail.

## 2016-03-30 ENCOUNTER — Other Ambulatory Visit: Payer: Self-pay | Admitting: Cardiovascular Disease

## 2016-04-06 ENCOUNTER — Other Ambulatory Visit: Payer: Self-pay | Admitting: Internal Medicine

## 2016-04-06 NOTE — Telephone Encounter (Signed)
Last filled by you on 12/2014--pt saw cardiology 01/2015 and I did not see any mention of this medication--please advise if okay to refill

## 2016-04-07 NOTE — Telephone Encounter (Signed)
She should continue. Sent electronically

## 2016-04-08 ENCOUNTER — Other Ambulatory Visit: Payer: Self-pay | Admitting: Internal Medicine

## 2016-04-14 ENCOUNTER — Other Ambulatory Visit: Payer: Self-pay | Admitting: Internal Medicine

## 2016-04-14 NOTE — Telephone Encounter (Signed)
Electronic refill request. Last Filled:    60 tablet 0 03/16/2016  Last office visit:   11/20/15  6 months follow up.  Please advise.

## 2016-04-15 NOTE — Telephone Encounter (Signed)
Ok to phone in Clonazepam.  *please change class to "phone in" 

## 2016-04-15 NOTE — Telephone Encounter (Signed)
Rx called in to pharmacy. 

## 2016-04-29 ENCOUNTER — Other Ambulatory Visit: Payer: Self-pay | Admitting: Internal Medicine

## 2016-05-15 ENCOUNTER — Other Ambulatory Visit: Payer: Self-pay | Admitting: Internal Medicine

## 2016-05-15 NOTE — Telephone Encounter (Signed)
Last filled 04/15/16--please advise 

## 2016-05-15 NOTE — Telephone Encounter (Signed)
Ok to phone in Clonazepam 

## 2016-05-16 NOTE — Telephone Encounter (Signed)
Rx called in to pharmacy. 

## 2016-05-22 ENCOUNTER — Ambulatory Visit (INDEPENDENT_AMBULATORY_CARE_PROVIDER_SITE_OTHER): Payer: Medicare Other | Admitting: Internal Medicine

## 2016-05-22 ENCOUNTER — Encounter: Payer: Self-pay | Admitting: Internal Medicine

## 2016-05-22 VITALS — BP 106/68 | HR 65 | Temp 97.3°F | Ht 64.0 in | Wt 103.5 lb

## 2016-05-22 DIAGNOSIS — E559 Vitamin D deficiency, unspecified: Secondary | ICD-10-CM | POA: Diagnosis not present

## 2016-05-22 DIAGNOSIS — E039 Hypothyroidism, unspecified: Secondary | ICD-10-CM

## 2016-05-22 DIAGNOSIS — E7800 Pure hypercholesterolemia, unspecified: Secondary | ICD-10-CM

## 2016-05-22 DIAGNOSIS — I779 Disorder of arteries and arterioles, unspecified: Secondary | ICD-10-CM

## 2016-05-22 DIAGNOSIS — I482 Chronic atrial fibrillation, unspecified: Secondary | ICD-10-CM

## 2016-05-22 DIAGNOSIS — I739 Peripheral vascular disease, unspecified: Secondary | ICD-10-CM

## 2016-05-22 DIAGNOSIS — Z0000 Encounter for general adult medical examination without abnormal findings: Secondary | ICD-10-CM

## 2016-05-22 DIAGNOSIS — F411 Generalized anxiety disorder: Secondary | ICD-10-CM | POA: Diagnosis not present

## 2016-05-22 DIAGNOSIS — K2270 Barrett's esophagus without dysplasia: Secondary | ICD-10-CM

## 2016-05-22 DIAGNOSIS — I5032 Chronic diastolic (congestive) heart failure: Secondary | ICD-10-CM

## 2016-05-22 DIAGNOSIS — G4700 Insomnia, unspecified: Secondary | ICD-10-CM

## 2016-05-22 DIAGNOSIS — I10 Essential (primary) hypertension: Secondary | ICD-10-CM | POA: Diagnosis not present

## 2016-05-22 DIAGNOSIS — K5900 Constipation, unspecified: Secondary | ICD-10-CM

## 2016-05-22 LAB — T4, FREE: Free T4: 0.96 ng/dL (ref 0.60–1.60)

## 2016-05-22 LAB — COMPREHENSIVE METABOLIC PANEL
ALT: 16 U/L (ref 0–35)
AST: 32 U/L (ref 0–37)
Albumin: 3.8 g/dL (ref 3.5–5.2)
Alkaline Phosphatase: 95 U/L (ref 39–117)
BUN: 18 mg/dL (ref 6–23)
CO2: 36 meq/L — AB (ref 19–32)
CREATININE: 1.12 mg/dL (ref 0.40–1.20)
Calcium: 9.3 mg/dL (ref 8.4–10.5)
Chloride: 99 mEq/L (ref 96–112)
GFR: 48.39 mL/min — ABNORMAL LOW (ref >60.00)
GLUCOSE: 107 mg/dL — AB (ref 70–99)
Potassium: 4.3 mEq/L (ref 3.5–5.1)
SODIUM: 140 meq/L (ref 135–145)
Total Bilirubin: 0.8 mg/dL (ref 0.2–1.2)
Total Protein: 7.1 g/dL (ref 6.0–8.3)

## 2016-05-22 LAB — TSH: TSH: 3.53 u[IU]/mL (ref 0.35–4.50)

## 2016-05-22 LAB — VITAMIN D 25 HYDROXY (VIT D DEFICIENCY, FRACTURES): VITD: 50.44 ng/mL (ref 30.00–100.00)

## 2016-05-22 NOTE — Patient Instructions (Signed)

## 2016-05-22 NOTE — Assessment & Plan Note (Signed)
Compensated Continue Bisoprolol, Lasix and Potassium If Potassium is too hard to swallow, we can change it to powder

## 2016-05-22 NOTE — Assessment & Plan Note (Signed)
CMET and Lipid Profile today Encouraged her to consume a low fat diet Continue ASA and Lipitor

## 2016-05-22 NOTE — Assessment & Plan Note (Signed)
Controlled with Amitiza Monitor

## 2016-05-22 NOTE — Assessment & Plan Note (Signed)
Controlled with Prilosec Will continue at this time

## 2016-05-22 NOTE — Progress Notes (Signed)
HPI:  Pt presents to the clinic today for her Medicare Wellness Exam. She is also due to follow up chronic conditions.  Anxiety: She is not sure what triggers this, she just feel anxious all the time. She takes Klonopin 3 x day with good relief of symptoms.  Afib: Controlled on Diltiazem and Bisoprolol. She denies palpitations or feeling like her heart is racing. She is taking a ASA daily. She follows with Dr. Allyson Sabal.  GERD with Hiatal Hernia and Barrett's Esophagus: Last EGD 06/2008. She denies breakthrough symptoms on Prilosec.   HLD with CAD: Her last LDL was 51, 11/2015. She is taking her Lipitor daily as prescribed. She denies myalgias or chest pain. She follows with Dr. Allyson Sabal. Note from 02/2016 reviewed.  HTN: BP today is 106/68. She is taking Diltiazem and Bisoprolol for Afib, not for BP control.  Hypothyroidism: She is taking her Synthroid daily. Her TSH has not been checked in over 1 year. She denies weight gain, dry skin, depression. She has cold intolerance and constipation, but this is not new for her.  Insomnia: She reports sometimes she has difficulty falling asleep. Most nights, she can get about 8 hours of sleep. She is taking Remeron, Klonopin, Seroquel as prescribed. Her daughter request that I not adjust or wean these medications because she has been taking them so long.   CHF: She denies cough, shortness of breath or swelling in her feet. She takes Lasix daily. Her daughter reports she is having more difficulty swallowing the potassium pills. Echo from 01/2015 reviewed. She does use supplemental oxygen at night only. She follows with Dr. Allyson Sabal.   Constipation: She is taking Amitiza as prescribed with good relief. She denies blood in her stool.  Past Medical History:  Diagnosis Date  . Anxiety   . Atrial fibrillation with RVR (HCC)   . Barrett's esophagus   . CAD (coronary artery disease) of artery bypass graft 1995  . Fracture of right shoulder 02/2008  . GERD  (gastroesophageal reflux disease)   . Hemorrhoids, external   . Hiatal hernia   . History of colon polyps   . Hyperlipidemia   . Hypertension   . Hypothyroidism   . Insomnia due to medical condition   . Left wrist fracture 09/2007  . Low back pain   . Syncope     Current Outpatient Prescriptions  Medication Sig Dispense Refill  . acetaminophen (TYLENOL) 500 MG tablet Take 1 tablet (500 mg total) by mouth every 6 (six) hours as needed for mild pain. For pain 30 tablet 0  . aspirin 81 MG tablet Take 81 mg by mouth daily.      Marland Kitchen atorvastatin (LIPITOR) 80 MG tablet TAKE 1 TABLET DAILY 90 tablet 1  . bisoprolol (ZEBETA) 10 MG tablet TAKE 1 TABLET DAILY 90 tablet 0  . Calcium Carbonate-Vitamin D (CALCIUM 600-D) 600-400 MG-UNIT per tablet Take 1 tablet by mouth daily.     . clonazePAM (KLONOPIN) 1 MG tablet TAKE 1/2 TABLET BY MOUTH EVERY MORNING, 1/2 TABLET AT 6 PM, AND 1 TABLET AT BEDTIME 60 tablet 0  . cyanocobalamin 500 MCG tablet Take 500 mcg by mouth daily.      Marland Kitchen diltiazem (TIAZAC) 120 MG 24 hr capsule Take 1 capsule (120 mg total) by mouth daily. 90 capsule 3  . furosemide (LASIX) 20 MG tablet TAKE 1 TABLET DAILY (MUST SCHEDULE ANNUAL EXAM) 90 tablet 0  . ipratropium-albuterol (DUONEB) 0.5-2.5 (3) MG/3ML SOLN Take 3 mLs by nebulization every 6 (  six) hours as needed. 360 mL 0  . levothyroxine (SYNTHROID, LEVOTHROID) 88 MCG tablet TAKE 1 TABLET DAILY 90 tablet 1  . lubiprostone (AMITIZA) 24 MCG capsule Take 1 capsule (24 mcg total) by mouth 2 (two) times daily with a meal. (Patient taking differently: Take 24 mcg by mouth daily with breakfast. ) 180 capsule 3  . mirtazapine (REMERON) 45 MG tablet TAKE 1 TABLET AT BEDTIME 90 tablet 1  . Multiple Vitamin (MULTIVITAMIN) capsule Take 1 capsule by mouth daily.      Marland Kitchen omeprazole (PRILOSEC) 20 MG capsule TAKE 1 CAPSULE DAILY 90 capsule 3  . potassium chloride SA (KLOR-CON M20) 20 MEQ tablet Take 1 tablet (20 mEq total) by mouth daily. 90 tablet 1   . QUEtiapine (SEROQUEL) 25 MG tablet TAKE 1 TABLET AT BEDTIME 90 tablet 1  . traMADol (ULTRAM) 50 MG tablet Take 1 tablet (50 mg total) by mouth every 8 (eight) hours as needed. (Patient taking differently: Take 50 mg by mouth every 8 (eight) hours as needed for moderate pain. ) 30 tablet 0  . vitamin E 400 UNIT capsule Take 400 Units by mouth daily.       No current facility-administered medications for this visit.     Allergies  Allergen Reactions  . Codeine     unknown  . Meperidine Hcl     unknown  . Oxycodone-Acetaminophen     unknown    Family History  Problem Relation Age of Onset  . Heart disease Mother   . Heart disease Father   . Diabetes Son     Social History   Social History  . Marital status: Widowed    Spouse name: N/A  . Number of children: N/A  . Years of education: N/A   Occupational History  . Not on file.   Social History Main Topics  . Smoking status: Former Smoker    Quit date: 04/20/1968  . Smokeless tobacco: Never Used     Comment: QUIT IN 1970  . Alcohol use No     Comment: STOPPED IN 1970  . Drug use: No  . Sexual activity: Not Currently   Other Topics Concern  . Not on file   Social History Narrative  . No narrative on file    Hospitiliaztions: None  Health Maintenance:    Flu: 01/2011  Tetanus: unsure  Pneumovax: 12/2009  Prevnar: unsure  Zostavax: unsure  Mammogram: no longer screening  Pap Smear: no longer screening  Bone Density: > 5 years ago  Colon Screening: no longer screening  Eye Doctor: as needed  Dental Exam: as needed   Providers:   PCP: Nicki Reaper, NP-C  Cardiologist: Dr. Allyson Sabal    I have personally reviewed and have noted:  1. The patient's medical and social history 2. Their use of alcohol, tobacco or illicit drugs 3. Their current medications and supplements 4. The patient's functional ability including ADL's, fall risks, home safety risks and hearing or visual impairment. 5. Diet and physical  activities 6. Evidence for depression or mood disorder  Subjective:   Review of Systems:   Constitutional: Denies fever, malaise, fatigue, headache or abrupt weight changes.  HEENT: Denies eye pain, eye redness, ear pain, ringing in the ears, wax buildup, runny nose, nasal congestion, bloody nose, or sore throat. Respiratory: Denies difficulty breathing, shortness of breath, cough or sputum production.   Cardiovascular: Denies chest pain, chest tightness, palpitations or swelling in the hands or feet.  Gastrointestinal: Pt reports poor appetite.  Denies abdominal pain, bloating, constipation, diarrhea or blood in the stool.  GU: Denies urgency, frequency, pain with urination, burning sensation, blood in urine, odor or discharge. Musculoskeletal: Pt reports difficulty with gait. Denies decrease in range of motion, muscle pain or joint pain and swelling.  Skin: Pt reports dry skin.  Neurological: Pt reports insomnia and difficulty with memory. Denies dizziness, difficulty with speech or problems with balance and coordination.  Psych: Pt reports anxiety. Denies depression, SI/HI.  No other specific complaints in a complete review of systems (except as listed in HPI above).  Objective:  PE:   BP 106/68   Pulse 65   Temp 97.3 F (36.3 C) (Oral)   Ht 5\' 4"  (1.626 m)   Wt 103 lb 8 oz (46.9 kg)   SpO2 98%   BMI 17.77 kg/m   Wt Readings from Last 3 Encounters:  01/29/16 105 lb (47.6 kg)  11/20/15 106 lb (48.1 kg)  09/09/15 98 lb (44.5 kg)    General: Appears her stated age, chronically ill appearing, in NAD. Skin: Warm, dry and intact. 1 cm oval seborrheic keratosis noted on left cheek.  Neck: Neck supple, trachea midline. No masses, lumps or thyromegaly present.  Cardiovascular: Normal rate with irregular rhythm. S1,S2 noted. No JVD or BLE edema. Pulmonary/Chest: Normal effort and positive vesicular breath sounds. No respiratory distress. No wheezes, rales or ronchi noted.   Abdomen: Soft and nontender. Normal bowel sounds. No distention or masses noted.  Musculoskeletal: Gait shuffled but steady with the use of a walker. Neurological: Alert and oriented to person and place. Psychiatric: Mood and affect normal. Behavior is normal. Judgment and thought content normal.    BMET    Component Value Date/Time   NA 134 (L) 11/20/2015 1630   K 5.3 (H) 11/20/2015 1630   CL 96 11/20/2015 1630   CO2 34 (H) 11/20/2015 1630   GLUCOSE 100 (H) 11/20/2015 1630   BUN 17 11/20/2015 1630   CREATININE 1.06 11/20/2015 1630   CREATININE 1.31 (H) 04/19/2015 1624   CALCIUM 9.4 11/20/2015 1630   GFRNONAA 55 (L) 04/10/2015 0447   GFRAA >60 04/10/2015 0447    Lipid Panel     Component Value Date/Time   CHOL 120 11/20/2015 1630   TRIG 53.0 11/20/2015 1630   HDL 58.90 11/20/2015 1630   CHOLHDL 2 11/20/2015 1630   VLDL 10.6 11/20/2015 1630   LDLCALC 51 11/20/2015 1630    CBC    Component Value Date/Time   WBC 5.3 11/20/2015 1630   RBC 4.38 11/20/2015 1630   HGB 13.3 11/20/2015 1630   HCT 40.3 11/20/2015 1630   PLT 113.0 (L) 11/20/2015 1630   MCV 92.0 11/20/2015 1630   MCH 29.8 04/08/2015 0336   MCHC 33.0 11/20/2015 1630   RDW 15.8 (H) 11/20/2015 1630   LYMPHSABS 1.3 04/07/2015 1706   MONOABS 0.6 04/07/2015 1706   EOSABS 0.1 04/07/2015 1706   BASOSABS 0.0 04/07/2015 1706    Hgb A1C Lab Results  Component Value Date   HGBA1C 6.0 (H) 01/23/2015      Assessment and Plan:   Medicare Annual Wellness Visit:  Diet: She does eat meat. She eats fruits and veggies daily. She does eat some fried food. She drinks mostly water and Boost. Physical activity: Sedentary Depression/mood screen: Negative Hearing: Intact to whispered voice Visual acuity: Grossly normal ADLs: Independent with ADL's but is starting to struggle with this. Can feed herself.  Fall risk: High Home safety: Good Cognitive evaluation: Some short  term memory impairment but otherwise  cognitively aware for age. EOL planning: Adv directives, DNR/ I agree  Preventative Medicine: She declines flu, tetanus, prevnar or zostavax. Pneumovax UTD. She no longer wishes to do pap smear, mammogram, colon cancer screening or bone density exams. Encouraged her to see an eye doctor or dentist yearly. Encouraged her to consume a low fat diet.   Next appointment: 1 year, Medicare Wellness Exam    Nicki Reaper, NP

## 2016-05-22 NOTE — Assessment & Plan Note (Signed)
Irregular but rate controlled Continue Cardizem and Bisoprolol ASA for anticoag She will continue to follow with Dr. Allyson SabalBerry

## 2016-05-22 NOTE — Assessment & Plan Note (Signed)
CMET and Lipid profile today Continue Lipitor and ASA

## 2016-05-22 NOTE — Assessment & Plan Note (Signed)
Stable on Clonazepam TID Will monitor

## 2016-05-22 NOTE — Assessment & Plan Note (Signed)
She is taking max dose Remeron, Clonazepam and Seroquel at bedtime I would like to stop the Seroquel but her daughter absolutely refuses Will monitor for now

## 2016-05-22 NOTE — Assessment & Plan Note (Signed)
TSH and T4 today Will adjust Synthroid if needed based on labs 

## 2016-05-22 NOTE — Assessment & Plan Note (Signed)
Controlled CMET today Will monitor

## 2016-06-02 ENCOUNTER — Telehealth: Payer: Self-pay

## 2016-06-02 NOTE — Telephone Encounter (Signed)
[facility]; received message that Dr Allyson SabalBerry has been filling out info for pts oxygen.Per [facility] records it appears R Baity NP has filled out oxygen papers before and Shanda BumpsJessica does not have any info on Dr Allyson SabalBerry; if Dr Allyson SabalBerry is the one who has been doing this please provide Dr Hazle CocaBerry's contact info.

## 2016-06-03 NOTE — Telephone Encounter (Signed)
Can she fax over a copy of the one I filled out previously?

## 2016-06-04 NOTE — Telephone Encounter (Signed)
Form faxed, sent to be scanned

## 2016-06-14 ENCOUNTER — Other Ambulatory Visit: Payer: Self-pay | Admitting: Internal Medicine

## 2016-06-15 ENCOUNTER — Other Ambulatory Visit: Payer: Self-pay | Admitting: Internal Medicine

## 2016-06-15 NOTE — Telephone Encounter (Signed)
Last filled 05/15/2016--please advise

## 2016-06-15 NOTE — Telephone Encounter (Signed)
Ok to phone in Clonazepam.  *please change class to "phone in" 

## 2016-06-15 NOTE — Telephone Encounter (Signed)
Rx called in to pharmacy. 

## 2016-06-27 ENCOUNTER — Other Ambulatory Visit: Payer: Self-pay | Admitting: Internal Medicine

## 2016-06-29 NOTE — Telephone Encounter (Signed)
Last filled 12/30/15 #90 with 1 refill--please advise

## 2016-07-05 ENCOUNTER — Other Ambulatory Visit: Payer: Self-pay | Admitting: Internal Medicine

## 2016-07-07 ENCOUNTER — Other Ambulatory Visit: Payer: Self-pay | Admitting: Internal Medicine

## 2016-07-08 ENCOUNTER — Telehealth: Payer: Self-pay

## 2016-07-08 NOTE — Telephone Encounter (Signed)
Jasmine [name] with [facility] left v/m checking on status of CMS form for oxygen [facility]. Jasmine [name] request cb if did not receive the form.

## 2016-07-09 NOTE — Telephone Encounter (Signed)
Form has been refaxed.

## 2016-07-09 NOTE — Telephone Encounter (Signed)
Left message on voicemail I faxed to original form 06/04/16 and received confirmation of fax.Marland Kitchen.Marland Kitchen.I sent the form to be scanned into chart, but I do not see it in the chart... I requested information on how the form is supposed to filled out, the information needed on form

## 2016-07-14 ENCOUNTER — Other Ambulatory Visit: Payer: Self-pay | Admitting: Internal Medicine

## 2016-07-14 NOTE — Telephone Encounter (Signed)
Rx called in to pharmacy. 

## 2016-07-14 NOTE — Telephone Encounter (Signed)
Last filled 06/15/16--please advise

## 2016-07-14 NOTE — Telephone Encounter (Signed)
Ok to phone in Clonazepam 

## 2016-07-15 ENCOUNTER — Encounter (HOSPITAL_COMMUNITY): Payer: Self-pay | Admitting: Emergency Medicine

## 2016-07-15 ENCOUNTER — Other Ambulatory Visit: Payer: Self-pay | Admitting: Internal Medicine

## 2016-07-15 ENCOUNTER — Emergency Department (HOSPITAL_COMMUNITY): Payer: Medicare Other

## 2016-07-15 ENCOUNTER — Inpatient Hospital Stay (HOSPITAL_COMMUNITY)
Admission: EM | Admit: 2016-07-15 | Discharge: 2016-07-21 | DRG: 871 | Disposition: A | Discharge status: Skilled Nursing Facility | Payer: Medicare Other | Attending: Internal Medicine | Admitting: Internal Medicine

## 2016-07-15 ENCOUNTER — Telehealth: Payer: Self-pay | Admitting: Internal Medicine

## 2016-07-15 DIAGNOSIS — Y92239 Unspecified place in hospital as the place of occurrence of the external cause: Secondary | ICD-10-CM | POA: Diagnosis present

## 2016-07-15 DIAGNOSIS — E785 Hyperlipidemia, unspecified: Secondary | ICD-10-CM | POA: Diagnosis present

## 2016-07-15 DIAGNOSIS — I25810 Atherosclerosis of coronary artery bypass graft(s) without angina pectoris: Secondary | ICD-10-CM | POA: Diagnosis present

## 2016-07-15 DIAGNOSIS — Z7982 Long term (current) use of aspirin: Secondary | ICD-10-CM

## 2016-07-15 DIAGNOSIS — R6520 Severe sepsis without septic shock: Secondary | ICD-10-CM | POA: Diagnosis present

## 2016-07-15 DIAGNOSIS — J189 Pneumonia, unspecified organism: Secondary | ICD-10-CM | POA: Diagnosis not present

## 2016-07-15 DIAGNOSIS — D696 Thrombocytopenia, unspecified: Secondary | ICD-10-CM | POA: Diagnosis not present

## 2016-07-15 DIAGNOSIS — R488 Other symbolic dysfunctions: Secondary | ICD-10-CM | POA: Diagnosis not present

## 2016-07-15 DIAGNOSIS — J1008 Influenza due to other identified influenza virus with other specified pneumonia: Secondary | ICD-10-CM | POA: Diagnosis present

## 2016-07-15 DIAGNOSIS — J111 Influenza due to unidentified influenza virus with other respiratory manifestations: Secondary | ICD-10-CM | POA: Diagnosis not present

## 2016-07-15 DIAGNOSIS — A409 Streptococcal sepsis, unspecified: Secondary | ICD-10-CM | POA: Diagnosis not present

## 2016-07-15 DIAGNOSIS — J449 Chronic obstructive pulmonary disease, unspecified: Secondary | ICD-10-CM | POA: Diagnosis not present

## 2016-07-15 DIAGNOSIS — R509 Fever, unspecified: Secondary | ICD-10-CM | POA: Diagnosis not present

## 2016-07-15 DIAGNOSIS — R5383 Other fatigue: Secondary | ICD-10-CM | POA: Diagnosis present

## 2016-07-15 DIAGNOSIS — I110 Hypertensive heart disease with heart failure: Secondary | ICD-10-CM | POA: Diagnosis present

## 2016-07-15 DIAGNOSIS — I482 Chronic atrial fibrillation, unspecified: Secondary | ICD-10-CM | POA: Diagnosis present

## 2016-07-15 DIAGNOSIS — A419 Sepsis, unspecified organism: Secondary | ICD-10-CM | POA: Diagnosis not present

## 2016-07-15 DIAGNOSIS — R4182 Altered mental status, unspecified: Secondary | ICD-10-CM | POA: Diagnosis present

## 2016-07-15 DIAGNOSIS — R4189 Other symptoms and signs involving cognitive functions and awareness: Secondary | ICD-10-CM

## 2016-07-15 DIAGNOSIS — Z86010 Personal history of colonic polyps: Secondary | ICD-10-CM

## 2016-07-15 DIAGNOSIS — A4189 Other specified sepsis: Secondary | ICD-10-CM | POA: Diagnosis present

## 2016-07-15 DIAGNOSIS — I10 Essential (primary) hypertension: Secondary | ICD-10-CM | POA: Diagnosis not present

## 2016-07-15 DIAGNOSIS — Z681 Body mass index (BMI) 19 or less, adult: Secondary | ICD-10-CM | POA: Diagnosis not present

## 2016-07-15 DIAGNOSIS — J154 Pneumonia due to other streptococci: Secondary | ICD-10-CM | POA: Diagnosis present

## 2016-07-15 DIAGNOSIS — K2270 Barrett's esophagus without dysplasia: Secondary | ICD-10-CM | POA: Diagnosis present

## 2016-07-15 DIAGNOSIS — A403 Sepsis due to Streptococcus pneumoniae: Secondary | ICD-10-CM | POA: Diagnosis not present

## 2016-07-15 DIAGNOSIS — R1312 Dysphagia, oropharyngeal phase: Secondary | ICD-10-CM | POA: Diagnosis not present

## 2016-07-15 DIAGNOSIS — J9601 Acute respiratory failure with hypoxia: Secondary | ICD-10-CM

## 2016-07-15 DIAGNOSIS — J9621 Acute and chronic respiratory failure with hypoxia: Secondary | ICD-10-CM | POA: Diagnosis present

## 2016-07-15 DIAGNOSIS — R05 Cough: Secondary | ICD-10-CM | POA: Diagnosis not present

## 2016-07-15 DIAGNOSIS — Z66 Do not resuscitate: Secondary | ICD-10-CM | POA: Diagnosis present

## 2016-07-15 DIAGNOSIS — Z8249 Family history of ischemic heart disease and other diseases of the circulatory system: Secondary | ICD-10-CM

## 2016-07-15 DIAGNOSIS — Z951 Presence of aortocoronary bypass graft: Secondary | ICD-10-CM

## 2016-07-15 DIAGNOSIS — G4489 Other headache syndrome: Secondary | ICD-10-CM | POA: Diagnosis not present

## 2016-07-15 DIAGNOSIS — I5032 Chronic diastolic (congestive) heart failure: Secondary | ICD-10-CM | POA: Diagnosis present

## 2016-07-15 DIAGNOSIS — F419 Anxiety disorder, unspecified: Secondary | ICD-10-CM | POA: Diagnosis present

## 2016-07-15 DIAGNOSIS — J1108 Influenza due to unidentified influenza virus with specified pneumonia: Secondary | ICD-10-CM | POA: Diagnosis not present

## 2016-07-15 DIAGNOSIS — R0602 Shortness of breath: Secondary | ICD-10-CM | POA: Diagnosis not present

## 2016-07-15 DIAGNOSIS — I2510 Atherosclerotic heart disease of native coronary artery without angina pectoris: Secondary | ICD-10-CM | POA: Diagnosis not present

## 2016-07-15 DIAGNOSIS — T424X5A Adverse effect of benzodiazepines, initial encounter: Secondary | ICD-10-CM | POA: Diagnosis present

## 2016-07-15 DIAGNOSIS — J181 Lobar pneumonia, unspecified organism: Secondary | ICD-10-CM

## 2016-07-15 DIAGNOSIS — Z833 Family history of diabetes mellitus: Secondary | ICD-10-CM

## 2016-07-15 DIAGNOSIS — R2681 Unsteadiness on feet: Secondary | ICD-10-CM | POA: Diagnosis not present

## 2016-07-15 DIAGNOSIS — Z9981 Dependence on supplemental oxygen: Secondary | ICD-10-CM

## 2016-07-15 DIAGNOSIS — Z79899 Other long term (current) drug therapy: Secondary | ICD-10-CM

## 2016-07-15 DIAGNOSIS — E039 Hypothyroidism, unspecified: Secondary | ICD-10-CM | POA: Diagnosis not present

## 2016-07-15 DIAGNOSIS — B999 Unspecified infectious disease: Secondary | ICD-10-CM | POA: Diagnosis not present

## 2016-07-15 DIAGNOSIS — R51 Headache: Secondary | ICD-10-CM | POA: Diagnosis not present

## 2016-07-15 DIAGNOSIS — J440 Chronic obstructive pulmonary disease with acute lower respiratory infection: Secondary | ICD-10-CM | POA: Diagnosis present

## 2016-07-15 DIAGNOSIS — I252 Old myocardial infarction: Secondary | ICD-10-CM | POA: Diagnosis not present

## 2016-07-15 DIAGNOSIS — M6281 Muscle weakness (generalized): Secondary | ICD-10-CM | POA: Diagnosis not present

## 2016-07-15 DIAGNOSIS — I25700 Atherosclerosis of coronary artery bypass graft(s), unspecified, with unstable angina pectoris: Secondary | ICD-10-CM | POA: Diagnosis not present

## 2016-07-15 DIAGNOSIS — J441 Chronic obstructive pulmonary disease with (acute) exacerbation: Secondary | ICD-10-CM | POA: Diagnosis present

## 2016-07-15 DIAGNOSIS — E43 Unspecified severe protein-calorie malnutrition: Secondary | ICD-10-CM | POA: Diagnosis present

## 2016-07-15 DIAGNOSIS — F329 Major depressive disorder, single episode, unspecified: Secondary | ICD-10-CM | POA: Diagnosis present

## 2016-07-15 DIAGNOSIS — Z87891 Personal history of nicotine dependence: Secondary | ICD-10-CM

## 2016-07-15 DIAGNOSIS — R498 Other voice and resonance disorders: Secondary | ICD-10-CM | POA: Diagnosis not present

## 2016-07-15 DIAGNOSIS — I509 Heart failure, unspecified: Secondary | ICD-10-CM | POA: Diagnosis not present

## 2016-07-15 DIAGNOSIS — Z5189 Encounter for other specified aftercare: Secondary | ICD-10-CM | POA: Diagnosis not present

## 2016-07-15 DIAGNOSIS — I82409 Acute embolism and thrombosis of unspecified deep veins of unspecified lower extremity: Secondary | ICD-10-CM | POA: Insufficient documentation

## 2016-07-15 DIAGNOSIS — R262 Difficulty in walking, not elsewhere classified: Secondary | ICD-10-CM | POA: Diagnosis not present

## 2016-07-15 DIAGNOSIS — K219 Gastro-esophageal reflux disease without esophagitis: Secondary | ICD-10-CM | POA: Diagnosis present

## 2016-07-15 DIAGNOSIS — R2689 Other abnormalities of gait and mobility: Secondary | ICD-10-CM | POA: Diagnosis not present

## 2016-07-15 LAB — URINALYSIS, ROUTINE W REFLEX MICROSCOPIC
Bilirubin Urine: NEGATIVE
Glucose, UA: NEGATIVE mg/dL
Ketones, ur: NEGATIVE mg/dL
Leukocytes, UA: NEGATIVE
Nitrite: NEGATIVE
PH: 6 (ref 5.0–8.0)
Protein, ur: NEGATIVE mg/dL
SPECIFIC GRAVITY, URINE: 1.011 (ref 1.005–1.030)

## 2016-07-15 LAB — CBC WITH DIFFERENTIAL/PLATELET
BASOS PCT: 0 %
Basophils Absolute: 0 10*3/uL (ref 0.0–0.1)
EOS ABS: 0 10*3/uL (ref 0.0–0.7)
EOS PCT: 0 %
HEMATOCRIT: 38.9 % (ref 36.0–46.0)
HEMOGLOBIN: 13 g/dL (ref 12.0–15.0)
LYMPHS PCT: 14 %
Lymphs Abs: 0.6 10*3/uL — ABNORMAL LOW (ref 0.7–4.0)
MCH: 30.6 pg (ref 26.0–34.0)
MCHC: 33.4 g/dL (ref 30.0–36.0)
MCV: 91.5 fL (ref 78.0–100.0)
MONOS PCT: 11 %
Monocytes Absolute: 0.5 10*3/uL (ref 0.1–1.0)
NEUTROS ABS: 3.2 10*3/uL (ref 1.7–7.7)
NEUTROS PCT: 75 %
Platelets: 80 10*3/uL — ABNORMAL LOW (ref 150–400)
RBC: 4.25 MIL/uL (ref 3.87–5.11)
RDW: 14.2 % (ref 11.5–15.5)
WBC: 4.3 10*3/uL (ref 4.0–10.5)

## 2016-07-15 LAB — COMPREHENSIVE METABOLIC PANEL
ALK PHOS: 89 U/L (ref 38–126)
ALT: 21 U/L (ref 14–54)
AST: 48 U/L — ABNORMAL HIGH (ref 15–41)
Albumin: 4.1 g/dL (ref 3.5–5.0)
Anion gap: 9 (ref 5–15)
BILIRUBIN TOTAL: 1 mg/dL (ref 0.3–1.2)
BUN: 16 mg/dL (ref 6–20)
CALCIUM: 8.8 mg/dL — AB (ref 8.9–10.3)
CO2: 31 mmol/L (ref 22–32)
CREATININE: 1.06 mg/dL — AB (ref 0.44–1.00)
Chloride: 96 mmol/L — ABNORMAL LOW (ref 101–111)
GFR calc non Af Amer: 44 mL/min — ABNORMAL LOW (ref >60)
GFR, EST AFRICAN AMERICAN: 52 mL/min — AB (ref >60)
GLUCOSE: 128 mg/dL — AB (ref 65–99)
Potassium: 3.8 mmol/L (ref 3.5–5.1)
SODIUM: 136 mmol/L (ref 135–145)
TOTAL PROTEIN: 7 g/dL (ref 6.5–8.1)

## 2016-07-15 LAB — INFLUENZA PANEL BY PCR (TYPE A & B)
INFLBPCR: NEGATIVE
Influenza A By PCR: POSITIVE — AB

## 2016-07-15 LAB — TROPONIN I: Troponin I: 0.03 ng/mL (ref <0.03)

## 2016-07-15 MED ORDER — DEXTROSE 5 % IV SOLN
1.0000 g | INTRAVENOUS | Status: DC
  Administered 2016-07-15 – 2016-07-20 (×6): 1 g via INTRAVENOUS
  Filled 2016-07-15 (×8): qty 10

## 2016-07-15 MED ORDER — ACETAMINOPHEN 325 MG PO TABS: 650.0000 mg | ORAL_TABLET | Freq: Four times a day (QID) | ORAL | Status: DC | PRN

## 2016-07-15 MED ORDER — LEVOTHYROXINE SODIUM 88 MCG PO TABS
88.0000 ug | ORAL_TABLET | Freq: Every day | ORAL | Status: DC
  Administered 2016-07-16 – 2016-07-21 (×6): 88 ug via ORAL
  Filled 2016-07-15 (×6): qty 1

## 2016-07-15 MED ORDER — ALBUTEROL SULFATE (2.5 MG/3ML) 0.083% IN NEBU: 2.5000 mg | INHALATION_SOLUTION | RESPIRATORY_TRACT | Status: DC | PRN

## 2016-07-15 MED ORDER — SODIUM CHLORIDE 0.9 % IV BOLUS (SEPSIS)
1000.0000 mL | Freq: Once | INTRAVENOUS | Status: AC
  Administered 2016-07-15: 1000 mL via INTRAVENOUS

## 2016-07-15 MED ORDER — CLONAZEPAM 0.5 MG PO TABS
0.5000 mg | ORAL_TABLET | ORAL | Status: DC
  Administered 2016-07-16 – 2016-07-21 (×6): 0.5 mg via ORAL
  Filled 2016-07-15 (×7): qty 1

## 2016-07-15 MED ORDER — ONDANSETRON HCL 4 MG/2ML IJ SOLN: 4.0000 mg | Freq: Four times a day (QID) | INTRAMUSCULAR | Status: DC | PRN

## 2016-07-15 MED ORDER — ACETAMINOPHEN 650 MG RE SUPP
650.0000 mg | Freq: Four times a day (QID) | RECTAL | Status: DC | PRN
Start: 2016-07-15 — End: 2016-07-21

## 2016-07-15 MED ORDER — SODIUM CHLORIDE 0.9 % IV SOLN
INTRAVENOUS | Status: AC
  Administered 2016-07-15: 21:00:00 via INTRAVENOUS

## 2016-07-15 MED ORDER — MIRTAZAPINE 15 MG PO TABS
45.0000 mg | ORAL_TABLET | Freq: Every day | ORAL | Status: DC
  Administered 2016-07-15 – 2016-07-17 (×3): 45 mg via ORAL
  Filled 2016-07-15 (×3): qty 3

## 2016-07-15 MED ORDER — ATORVASTATIN CALCIUM 40 MG PO TABS
80.0000 mg | ORAL_TABLET | Freq: Every day | ORAL | Status: DC
  Administered 2016-07-16 – 2016-07-21 (×6): 80 mg via ORAL
  Filled 2016-07-15 (×7): qty 2

## 2016-07-15 MED ORDER — ASPIRIN EC 81 MG PO TBEC
81.0000 mg | DELAYED_RELEASE_TABLET | Freq: Every day | ORAL | Status: DC
  Administered 2016-07-16 – 2016-07-21 (×6): 81 mg via ORAL
  Filled 2016-07-15 (×6): qty 1

## 2016-07-15 MED ORDER — ONDANSETRON HCL 4 MG PO TABS: 4.0000 mg | ORAL_TABLET | Freq: Four times a day (QID) | ORAL | Status: DC | PRN

## 2016-07-15 MED ORDER — DM-GUAIFENESIN ER 30-600 MG PO TB12
1.0000 | ORAL_TABLET | Freq: Two times a day (BID) | ORAL | Status: DC | PRN
  Administered 2016-07-16: 1 via ORAL
  Filled 2016-07-15: qty 1

## 2016-07-15 MED ORDER — CLONAZEPAM 1 MG PO TABS
1.0000 mg | ORAL_TABLET | Freq: Every day | ORAL | Status: DC
  Administered 2016-07-15 – 2016-07-20 (×5): 1 mg via ORAL
  Filled 2016-07-15 (×5): qty 1
  Filled 2016-07-15: qty 2

## 2016-07-15 MED ORDER — PREDNISONE 20 MG PO TABS
40.0000 mg | ORAL_TABLET | Freq: Every day | ORAL | Status: AC
  Administered 2016-07-16 – 2016-07-20 (×5): 40 mg via ORAL
  Filled 2016-07-15 (×5): qty 2

## 2016-07-15 MED ORDER — BISOPROLOL FUMARATE 5 MG PO TABS: 10.0000 mg | ORAL_TABLET | Freq: Every morning | ORAL | Status: DC

## 2016-07-15 MED ORDER — ALBUTEROL SULFATE (2.5 MG/3ML) 0.083% IN NEBU
5.0000 mg | INHALATION_SOLUTION | Freq: Once | RESPIRATORY_TRACT | Status: AC
  Administered 2016-07-15: 5 mg via RESPIRATORY_TRACT
  Filled 2016-07-15: qty 6

## 2016-07-15 MED ORDER — DEXTROSE 5 % IV SOLN
500.0000 mg | INTRAVENOUS | Status: DC
  Administered 2016-07-15 – 2016-07-16 (×2): 500 mg via INTRAVENOUS
  Filled 2016-07-15 (×2): qty 500

## 2016-07-15 MED ORDER — SODIUM CHLORIDE 0.9 % IV SOLN
INTRAVENOUS | Status: DC
  Administered 2016-07-16 (×2): via INTRAVENOUS

## 2016-07-15 MED ORDER — DILTIAZEM HCL ER COATED BEADS 120 MG PO CP24
120.0000 mg | ORAL_CAPSULE | Freq: Every day | ORAL | Status: DC
  Administered 2016-07-16 – 2016-07-20 (×5): 120 mg via ORAL
  Filled 2016-07-15 (×6): qty 1

## 2016-07-15 MED ORDER — QUETIAPINE FUMARATE 25 MG PO TABS
25.0000 mg | ORAL_TABLET | Freq: Every day | ORAL | Status: DC
  Administered 2016-07-15 – 2016-07-20 (×6): 25 mg via ORAL
  Filled 2016-07-15 (×6): qty 1

## 2016-07-15 NOTE — H&P (Signed)
History and Physical  Patient Name: Jasmine [name]     DTO:[id number]    DOB: 1923-04-16    DOA: 07/15/2016 PCP: Webb Silversmith, NP   Patient coming from: Home  Chief Complaint: Cough, malaise  HPI: Jasmine [name] is a 83 y.o. female with a past medical history significant for CAD s/p CABG, HFpEF on noct O2, Afib not on AC, hypothyroidism, and HTN who presents with few days worsening cough and weakness.  The patient was in her usual state of health until the middle of last week when she developed cough. Then the last 3-4 days she has had worsening cough, and now accompanied by progressive malaise, chills, headache, sweats, and weakness, to the point that she can barely get out of bed, sit today her daughter brought her to the emergency room.  ED course: -Afebrile, heart rate 94 and in A. fib, respirations 23, blood pressure 154/74, pulse oximetry 88% on room air -Na 136, K 3.8, Cr 1.06 (baseline 1.1), WBC 4.3K, Hgb 13 -AST minimally elevated at 48 -Platelets 80K -Troponin minimally elevated at 0.03 ng/mL -Lactic acid greater than 3 -Chest x-ray shows chronic scarring changes, cannot exclude new infiltrate, also small effusion -ECG showed rate controlled A. fib -She was given albuterol and TRH were asked to evaluate for pneumonia with sepsis     ROS: Review of Systems  Constitutional: Positive for chills, diaphoresis and malaise/fatigue.  Respiratory: Positive for cough and sputum production.   Neurological: Positive for weakness.  All other systems reviewed and are negative.         Past Medical History:  Diagnosis Date  . Anxiety   . Atrial fibrillation with RVR ([place])   . Barrett's esophagus   . CAD (coronary artery disease) of artery bypass graft 1995  . Fracture of right shoulder 02/2008  . GERD (gastroesophageal reflux disease)   . Hemorrhoids, external   . Hiatal hernia   . History of colon polyps   . Hyperlipidemia   . Hypertension   . Hypothyroidism   . Insomnia  due to medical condition   . Left wrist fracture 09/2007  . Low back pain   . Syncope     Past Surgical History:  Procedure Laterality Date  . CARDIAC CATHETERIZATION  02/25/1996   No intervention-recommend CABG. Left main-80% concentric distal stenosis, LAD-99% ostial stenosis, first diag-80% segmental proximal stenosis, Circumflex-80% ostial stenosis, RCA-90% ostial stenosis.  Marland Kitchen CARDIAC CATHETERIZATION  05/21/2003   No intervention-widely patent grafts.  . CAROTID DOPPLER Bilateral 05/15/2010   Right Bulb/CEA-demonstrated trace irregular nonhemodynamically significant plaque 0-49%. Left Bulb/Proximal ICA-demonstrated irregular nonhemodynamically signifcant plaque 0-49%.  . CAROTID ENDARTERECTOMY    . CATARACT EXTRACTION    . COLONOSCOPY W/ POLYPECTOMY  06/2005  . CORONARY ARTERY BYPASS GRAFT  1997  . LEXISCAN MYOVIEW  04/03/2011   No scintigraphic evidence of inducible myocardial ischemia. No lexiscan EKG changes. Non-diagnostic for ischemia.   . TRANSTHORACIC ECHOCARDIOGRAM  01/08/2011   EF 50-55%, LA moderately dilated, moderate tricuspid regurg, mild pulmonary hypertension.    Social History: The patient lives with her daughter. She hemorrhoids with a walker, hasn't met the house and "long time", because of frequent falls. She has no dementia and is able to pursue disability and all self-cares. She is from [place] originally, worked at the [employer] here in [place].  Was a former smoker, quit >40 yaers ago but daughter smokes around her.  Allergies  Allergen Reactions  . Codeine     unknown  .  Meperidine Hcl     unknown  . Oxycodone-Acetaminophen     unknown    Family history: family history includes Diabetes in her son; Heart disease in her father and mother.  Prior to Admission medications   Medication Sig Start Date End Date Taking? Authorizing Provider  acetaminophen (TYLENOL) 500 MG tablet Take 1 tablet (500 mg total) by mouth every 6 (six) hours as needed for  mild pain. For pain 04/10/15  Yes Belkys A Regalado, MD  aspirin 81 MG tablet Take 81 mg by mouth daily with breakfast.    Yes Historical Provider, MD  atorvastatin (LIPITOR) 80 MG tablet TAKE 1 TABLET DAILY 07/15/16  Yes Jearld Fenton, NP  bisoprolol (ZEBETA) 10 MG tablet TAKE 1 TABLET DAILY Patient taking differently: TAKE 1 TABLET by mouth every morning 07/06/16  Yes Jearld Fenton, NP  Calcium Carbonate-Vitamin D (CALCIUM 600-D) 600-400 MG-UNIT per tablet Take 1 tablet by mouth daily.    Yes Historical Provider, MD  clonazePAM (KLONOPIN) 1 MG tablet TAKE 1/2 TABLET BY MOUTH EVERY MORNING AND THEN TAKE 1/2 TABLET BY MOUTH AT 6PM THEN TAKE 1 EVERY NIGHT AT BEDTIME 07/14/16  Yes Jearld Fenton, NP  cyanocobalamin 500 MCG tablet Take 500 mcg by mouth daily.     Yes Historical Provider, MD  Dextromethorphan-Guaifenesin (ROBITUSSIN COUGH+CHEST CONG DM) 10-200 MG CAPS Take 2 capsules by mouth 2 (two) times daily as needed (for cough).   Yes Historical Provider, MD  diltiazem (TIAZAC) 120 MG 24 hr capsule Take 1 capsule (120 mg total) by mouth daily. Patient taking differently: Take 120 mg by mouth at bedtime.  03/30/16  Yes Lorretta Harp, MD  furosemide (LASIX) 20 MG tablet TAKE 1 TABLET DAILY (MUST SCHEDULE ANNUAL EXAM) Patient taking differently: TAKE 1 TABLET by mouth in the morning 04/29/16  Yes Jearld Fenton, NP  ipratropium-albuterol (DUONEB) 0.5-2.5 (3) MG/3ML SOLN Take 3 mLs by nebulization every 6 (six) hours as needed. Patient taking differently: Take 3 mLs by nebulization every 6 (six) hours as needed (for shortness of breath).  04/10/15  Yes Belkys A Regalado, MD  levothyroxine (SYNTHROID, LEVOTHROID) 88 MCG tablet TAKE 1 TABLET DAILY 06/15/16  Yes Jearld Fenton, NP  mirtazapine (REMERON) 45 MG tablet TAKE 1 TABLET AT BEDTIME 06/29/16  Yes Jearld Fenton, NP  Multiple Vitamin (MULTIVITAMIN) capsule Take 1 capsule by mouth daily.     Yes Historical Provider, MD  omeprazole (PRILOSEC) 20 MG  capsule TAKE 1 CAPSULE DAILY Patient taking differently: TAKE 1 CAPSULE by mouth every morning 12/10/15  Yes Jearld Fenton, NP  OXYGEN Inhale 2 L into the lungs at bedtime.   Yes Historical Provider, MD  potassium chloride SA (KLOR-CON M20) 20 MEQ tablet Take 1 tablet (20 mEq total) by mouth daily. Patient taking differently: Take 20 mEq by mouth daily with breakfast.  04/08/16  Yes Jearld Fenton, NP  QUEtiapine (SEROQUEL) 25 MG tablet TAKE 1 TABLET AT BEDTIME 07/08/16  Yes Jearld Fenton, NP  vitamin E 400 UNIT capsule Take 400 Units by mouth daily.     Yes Historical Provider, MD       Physical Exam: BP 107/84   Pulse 92   Temp 98 F (36.7 C) (Oral)   Resp 18   Ht 5' 6" (1.676 m)   Wt 46.7 kg (103 lb)   SpO2 95%   BMI 16.62 kg/m  General appearance: Frail very elderly adult female, alert and in mild distress  from malaise.   Eyes: Anicteric, conjunctiva pink, lids and lashes normal. PERRL.    ENT: No nasal deformity, discharge, epistaxis.  Hearing normal. OP with dry MM, upper dentures without lesions.   Neck: No neck masses.  Trachea midline.  No thyromegaly/tenderness. Lymph: No cervical or supraclavicular lymphadenopathy. Skin: Hot, moist.  No jaundice.  No suspicious rashes or lesions. Cardiac: Tachycardic, irregular, nl S1-S2, no murmurs appreciated.  Capillary refill is brisk.  JVP normal.  No LE edema.  Radial and DP pulses 2+ and symmetric. Respiratory: Tachypneic mildly.  Coarse breath sounds throughout, wheezing is present with exhalation. Abdomen: Abdomen soft.  No TTP. No ascites, distension, hepatosplenomegaly.   MSK: No deformities or effusions.  No cyanosis or clubbing. Neuro: Cranial nerves normal.  Sensation intact to light touch. Speech is fluent.  Muscle strength 5-/5 and symmetric, able to sit up with assistance.    Psych: Sensorium intact and responding to questions, attention normal.  Behavior appropriate.  Affect normal.  Judgment and insight appear  normal.     Labs on Admission:  I have personally reviewed following labs and imaging studies: CBC:  Recent Labs Lab 07/15/16 1627  WBC 4.3  NEUTROABS 3.2  HGB 13.0  HCT 38.9  MCV 91.5  PLT 80*   Basic Metabolic Panel:  Recent Labs Lab 07/15/16 1627  NA 136  K 3.8  CL 96*  CO2 31  GLUCOSE 128*  BUN 16  CREATININE 1.06*  CALCIUM 8.8*   GFR: Estimated Creatinine Clearance: 25.5 mL/min (A) (by C-G formula based on SCr of 1.06 mg/dL (H)).  Liver Function Tests:  Recent Labs Lab 07/15/16 1627  AST 48*  ALT 21  ALKPHOS 89  BILITOT 1.0  PROT 7.0  ALBUMIN 4.1   No results for input(s): LIPASE, AMYLASE in the last 168 hours. No results for input(s): AMMONIA in the last 168 hours. Coagulation Profile: No results for input(s): INR, PROTIME in the last 168 hours. Cardiac Enzymes:  Recent Labs Lab 07/15/16 1627  TROPONINI 0.03*   BNP (last 3 results) No results for input(s): PROBNP in the last 8760 hours. HbA1C: No results for input(s): HGBA1C in the last 72 hours. CBG: No results for input(s): GLUCAP in the last 168 hours. Lipid Profile: No results for input(s): CHOL, HDL, LDLCALC, TRIG, CHOLHDL, LDLDIRECT in the last 72 hours. Thyroid Function Tests: No results for input(s): TSH, T4TOTAL, FREET4, T3FREE, THYROIDAB in the last 72 hours. Anemia Panel: No results for input(s): VITAMINB12, FOLATE, FERRITIN, TIBC, IRON, RETICCTPCT in the last 72 hours. Sepsis Labs: Lactic acid >3 Invalid input(s): PROCALCITONIN, LACTICIDVEN No results found for this or any previous visit (from the past 240 hour(s)).       Radiological Exams on Admission: Personally reviewed CXR with small effusions, cannot exclude RUL pneumonia; CT head report reviewed: Dg Chest 2 View  Result Date: 07/15/2016 CLINICAL DATA:  Cough for 3-4 days, headache for 2-3 days, atrial fibrillation, coronary artery disease, hypertension, GERD, former smoker EXAM: CHEST  2 VIEW COMPARISON:   09/09/2015 FINDINGS: Normal heart size post CABG. Calcified tortuous aorta. Pulmonary vascularity normal. Bronchitic changes with chronic accentuation of RIGHT upper lobe markings, little changed. Persistent blunting of the LEFT lateral costophrenic angle and mild LEFT basilar atelectasis, with associated small LEFT pleural effusion. Minimal RIGHT basilar atelectasis. No definite acute infiltrate or pneumothorax. Bones diffusely demineralized. IMPRESSION: Post CABG. Aortic atherosclerosis. LEFT pleural effusion with bibasilar atelectasis greater on LEFT. Underlying emphysematous changes with chronic increase in RIGHT upper lobe  markings, may represent scarring, unable to exclude recurrent infiltrate. Electronically Signed   By: Lavonia Dana M.D.   On: 07/15/2016 17:48   Ct Head Wo Contrast  Result Date: 07/15/2016 CLINICAL DATA:  Cough and headache x3 days. EXAM: CT HEAD WITHOUT CONTRAST TECHNIQUE: Contiguous axial images were obtained from the base of the skull through the vertex without intravenous contrast. COMPARISON:  01/22/2015 CT FINDINGS: BRAIN: There is sulcal prominence consistent with superficial atrophy similar to prior exam. No intraparenchymal hemorrhage, mass effect nor midline shift. Periventricular and subcortical white matter hypodensities consistent with chronic mild small vessel ischemic disease are identified. No acute large vascular territory infarcts. No abnormal extra-axial fluid collections. Basal cisterns are not effaced and midline. VASCULAR: Moderate calcific atherosclerosis of the carotid siphons and both vertebral arteries. SKULL: No skull fracture. No significant scalp soft tissue swelling. SINUSES/ORBITS: The mastoid air-cells are clear. The included paranasal sinuses are well-aerated.The included ocular globes and orbital contents are non-suspicious. OTHER: None. IMPRESSION: Mild superficial atrophy with chronic small vessel ischemic disease periventricular white matter. No acute  intracranial abnormality noted. Electronically Signed   By: Ashley Royalty M.D.   On: 07/15/2016 20:07    EKG: Independently reviewed. Rate 99, A. fib.  No ST changes.  QTc normal.  Echocardiogram 2016: Report reviewed EF 55-60% PAP 55        Assessment/Plan  1. Sepsis, suspected pneumonia:  Suspected source pneumonia. Organism unknown.   Patient meets criteria given tachycardia, tachypnea, and evidence of organ dysfunction.  Lactate >3 mmol/L and repeat ordered within 6 hours.  This patient is not at high risk of poor outcomes with a qSOFA score of 1.  Antibiotics delivered in the ED.    -Sepsis bundle utilized:  -Blood, sputum and urine cultures drawn  -Fluid bolus given in ED, will repeat lactic acid  -Antibiotics: ceftriaxone and azithromycin  -Repeat renal function and complete blood count in AM  -Code SEPSIS called to E-link  -Follow urine pneumonia antigens  -Procalcitonin protocol     2. COPD exacerbation:  Given history of smoking and wheezing/bronchospasm will treat with oral steroids.  Note, family state she is on no inhalers and has no history of COPD however.  No PFTs in chart. -Prendisone 40 mg PO daily for 5 days -Albuterol PRN  3. Thrombocytopenia:  Mild, slightly lower than chronic value  4. Chronic diastolic CHF, Connie artery disease secondary prevention, hypertension:  EF 55%.  Currently appears slightly hypovolemic in setting of pneumonia.  Troponin leak is minimal, in setting of pneumonia sepsis. -Hold furosmide tomorrow -Strict I/Os, daily weights -Check TSH, mag -Continue BB -Continue statin, aspirin -Trend troponin  5. Atrial fibrillation:  CHADS2-VASc 6. Not on anticoagulation, presumably because of frequent falls. -Continue diltiazem and bisoprolol  6. Hypothyroidism:  -check TSH -Continue levothyroxine  7. Mood/other medications:  -continue Remeron, Klonopin, Seroquel    DVT prophylaxis: SCDs Code Status: DO NOT  RESUSCITATE Family Communication: Daughter at the bedside, when I plan discussed, consent was confirmed Disposition Plan: Anticipate IV fluids, IV antibiotics, follow culture data, PT evaluation. Consults called: None Admission status: INPATIENT    Medical decision making: Patient seen at 9:30 PM on 07/15/2016.  The patient was discussed with Dr. Kathrynn Humble.  What exists of the patient's chart was reviewed in depth and summarized above.  Clinical condition: HR and BP stable, not requiring NIV, stable tachypnea on supplemental O2.  Stable for medical surgical floor.      Edwin Dada [facility] Pager  [phone]       At the time of admission, it appears that the appropriate admission status for this patient is INPATIENT. This is judged to be reasonable and necessary in order to provide the required intensity of service to ensure the patient's safety given the presenting symptoms, physical exam findings, and initial radiographic and laboratory data in the context of their chronic comorbidities.  Together, these circumstances are felt to place her at high risk for further clinical deterioration threatening life, limb, or organ.   Patient requires inpatient status due to high intensity of service, high risk for further deterioration and high frequency of surveillance required because of this acute illness that poses a threat to life, limb or bodily function.  Factors support inpatient status included age and presentation with pneumonia causing sepsis (elevated lactate, troponin leak, elevated AST, thrombocytopenia), with evidence of effusion, PSI score 111, and chronic comorbidities including hypertension, congestive heart failure, coronary disease, hypothyroidism.  I certify that at the point of admission it is my clinical judgment that the patient will require inpatient hospital care spanning beyond 2 midnights from the point of admission and that early discharge would result in  unnecessary risk of decompensation and readmission or threat to life, limb or bodily function.

## 2016-07-15 NOTE — ED Provider Notes (Signed)
[facility]-EMERGENCY DEPT Provider Note   CSN: [id number] Arrival date & time: 07/15/16  1536     History   Chief Complaint Chief Complaint  Patient presents with  . Shortness of Breath  . Cough  . Headache    HPI Jasmine [name] is a 83 y.o. female.  The history is provided by the patient. No language interpreter was used.  Shortness of Breath  This is a new problem. The problem occurs intermittently.The current episode started more than 2 days ago. The problem has not changed since onset.Associated symptoms include headaches and cough. She has tried nothing for the symptoms. The treatment provided no relief. Associated medical issues include heart failure and past MI.  Cough  This is a new problem. The problem occurs constantly. The problem has been gradually worsening. The cough is non-productive. Associated symptoms include headaches and shortness of breath. She has tried nothing for the symptoms. She is not a smoker.  Headache   Associated symptoms include shortness of breath.  Pt complains of a cough and shortness of breath for the past 4   Past Medical History:  Diagnosis Date  . Anxiety   . Atrial fibrillation with RVR (HCC)   . Barrett's esophagus   . CAD (coronary artery disease) of artery bypass graft 1995  . Fracture of right shoulder 02/2008  . GERD (gastroesophageal reflux disease)   . Hemorrhoids, external   . Hiatal hernia   . History of colon polyps   . Hyperlipidemia   . Hypertension   . Hypothyroidism   . Insomnia due to medical condition   . Left wrist fracture 09/2007  . Low back pain   . Syncope     Patient Active Problem List   Diagnosis Date Noted  . Constipation 11/20/2015  . Thoracic back pain 11/20/2015  . CHF (congestive heart failure) (HCC) 01/22/2015  . Carotid artery disease (HCC) 03/22/2014  . CAD - CABG '97, cath OK 2005, Myoview low risk 12/12 01/06/2013  . HTN (hypertension) 01/06/2013  . Chronic atrial fibrillation (HCC) 01/06/2013    . BARRETT'S ESOPHAGUS 06/13/2008  . INSOMNIA, CHRONIC 01/17/2007  . Hypothyroidism 01/12/2007  . HLD (hyperlipidemia) 01/12/2007  . Anxiety state 01/12/2007    Past Surgical History:  Procedure Laterality Date  . CARDIAC CATHETERIZATION  02/25/1996   No intervention-recommend CABG. Left main-80% concentric distal stenosis, LAD-99% ostial stenosis, first diag-80% segmental proximal stenosis, Circumflex-80% ostial stenosis, RCA-90% ostial stenosis.  Marland Kitchen CARDIAC CATHETERIZATION  05/21/2003   No intervention-widely patent grafts.  . CAROTID DOPPLER Bilateral 05/15/2010   Right Bulb/CEA-demonstrated trace irregular nonhemodynamically significant plaque 0-49%. Left Bulb/Proximal ICA-demonstrated irregular nonhemodynamically signifcant plaque 0-49%.  . CAROTID ENDARTERECTOMY    . CATARACT EXTRACTION    . COLONOSCOPY W/ POLYPECTOMY  06/2005  . CORONARY ARTERY BYPASS GRAFT  1997  . LEXISCAN MYOVIEW  04/03/2011   No scintigraphic evidence of inducible myocardial ischemia. No lexiscan EKG changes. Non-diagnostic for ischemia.   . TRANSTHORACIC ECHOCARDIOGRAM  01/08/2011   EF 50-55%, LA moderately dilated, moderate tricuspid regurg, mild pulmonary hypertension.    OB History    No data available       Home Medications    Prior to Admission medications   Medication Sig Start Date End Date Taking? Authorizing Provider  acetaminophen (TYLENOL) 500 MG tablet Take 1 tablet (500 mg total) by mouth every 6 (six) hours as needed for mild pain. For pain 04/10/15  Yes Belkys A Regalado, MD  aspirin 81 MG tablet Take 81  mg by mouth daily with breakfast.    Yes Historical Provider, MD  atorvastatin (LIPITOR) 80 MG tablet TAKE 1 TABLET DAILY 07/15/16  Yes Lorre Munroe, NP  bisoprolol (ZEBETA) 10 MG tablet TAKE 1 TABLET DAILY Patient taking differently: TAKE 1 TABLET by mouth every morning 07/06/16  Yes Lorre Munroe, NP  Calcium Carbonate-Vitamin D (CALCIUM 600-D) 600-400 MG-UNIT per tablet Take 1 tablet  by mouth daily.    Yes Historical Provider, MD  clonazePAM (KLONOPIN) 1 MG tablet TAKE 1/2 TABLET BY MOUTH EVERY MORNING AND THEN TAKE 1/2 TABLET BY MOUTH AT 6PM THEN TAKE 1 EVERY NIGHT AT BEDTIME 07/14/16  Yes Lorre Munroe, NP  cyanocobalamin 500 MCG tablet Take 500 mcg by mouth daily.     Yes Historical Provider, MD  Dextromethorphan-Guaifenesin (ROBITUSSIN COUGH+CHEST CONG DM) 10-200 MG CAPS Take 2 capsules by mouth 2 (two) times daily as needed (for cough).   Yes Historical Provider, MD  diltiazem (TIAZAC) 120 MG 24 hr capsule Take 1 capsule (120 mg total) by mouth daily. Patient taking differently: Take 120 mg by mouth at bedtime.  03/30/16  Yes Runell Gess, MD  furosemide (LASIX) 20 MG tablet TAKE 1 TABLET DAILY (MUST SCHEDULE ANNUAL EXAM) Patient taking differently: TAKE 1 TABLET by mouth in the morning 04/29/16  Yes Lorre Munroe, NP  ipratropium-albuterol (DUONEB) 0.5-2.5 (3) MG/3ML SOLN Take 3 mLs by nebulization every 6 (six) hours as needed. Patient taking differently: Take 3 mLs by nebulization every 6 (six) hours as needed (for shortness of breath).  04/10/15  Yes Belkys A Regalado, MD  levothyroxine (SYNTHROID, LEVOTHROID) 88 MCG tablet TAKE 1 TABLET DAILY 06/15/16  Yes Lorre Munroe, NP  mirtazapine (REMERON) 45 MG tablet TAKE 1 TABLET AT BEDTIME 06/29/16  Yes Lorre Munroe, NP  Multiple Vitamin (MULTIVITAMIN) capsule Take 1 capsule by mouth daily.     Yes Historical Provider, MD  omeprazole (PRILOSEC) 20 MG capsule TAKE 1 CAPSULE DAILY Patient taking differently: TAKE 1 CAPSULE by mouth every morning 12/10/15  Yes Lorre Munroe, NP  OXYGEN Inhale 2 L into the lungs at bedtime.   Yes Historical Provider, MD  potassium chloride SA (KLOR-CON M20) 20 MEQ tablet Take 1 tablet (20 mEq total) by mouth daily. Patient taking differently: Take 20 mEq by mouth daily with breakfast.  04/08/16  Yes Lorre Munroe, NP  QUEtiapine (SEROQUEL) 25 MG tablet TAKE 1 TABLET AT BEDTIME 07/08/16   Yes Lorre Munroe, NP  vitamin E 400 UNIT capsule Take 400 Units by mouth daily.     Yes Historical Provider, MD  lubiprostone (AMITIZA) 24 MCG capsule Take 1 capsule (24 mcg total) by mouth 2 (two) times daily with a meal. Patient not taking: Reported on 07/15/2016 12/23/12   Stacie Glaze, MD    Family History Family History  Problem Relation Age of Onset  . Heart disease Mother   . Heart disease Father   . Diabetes Son     Social History Social History  Substance Use Topics  . Smoking status: Former Smoker    Quit date: 04/20/1968  . Smokeless tobacco: Never Used     Comment: QUIT IN 1970  . Alcohol use No     Comment: STOPPED IN 1970     Allergies   Codeine; Meperidine hcl; and Oxycodone-acetaminophen   Review of Systems Review of Systems  Respiratory: Positive for cough and shortness of breath.   Neurological: Positive for headaches.  All  other systems reviewed and are negative.    Physical Exam Updated Vital Signs BP (!) 154/74 (BP Location: Right Arm)   Pulse 94   Temp 98 F (36.7 C) (Oral)   Resp (!) 23   SpO2 94%   Physical Exam  Constitutional: She appears well-developed and well-nourished. No distress.  HENT:  Head: Normocephalic and atraumatic.  Right Ear: External ear normal.  Left Ear: External ear normal.  Nose: Nose normal.  Mouth/Throat: Oropharynx is clear and moist.  Eyes: Conjunctivae are normal.  Neck: Neck supple.  Cardiovascular: Normal rate and regular rhythm.   No murmur heard. Pulmonary/Chest: Effort normal and breath sounds normal. No respiratory distress.  Rhonchi,  Frequent cough   Abdominal: Soft. There is no tenderness.  Musculoskeletal: She exhibits no edema.  Neurological: She is alert.  Skin: Skin is warm and dry.  Psychiatric: She has a normal mood and affect.  Nursing note and vitals reviewed.    ED Treatments / Results  Labs (all labs ordered are listed, but only abnormal results are displayed) Labs Reviewed    CBC WITH DIFFERENTIAL/PLATELET - Abnormal; Notable for the following:       Result Value   Platelets 80 (*)    Lymphs Abs 0.6 (*)    All other components within normal limits  COMPREHENSIVE METABOLIC PANEL - Abnormal; Notable for the following:    Chloride 96 (*)    Glucose, Bld 128 (*)    Creatinine, Ser 1.06 (*)    Calcium 8.8 (*)    AST 48 (*)    GFR calc non Af Amer 44 (*)    GFR calc Af Amer 52 (*)    All other components within normal limits    EKG  EKG Interpretation None       Radiology Dg Chest 2 View  Result Date: 07/15/2016 CLINICAL DATA:  Cough for 3-4 days, headache for 2-3 days, atrial fibrillation, coronary artery disease, hypertension, GERD, former smoker EXAM: CHEST  2 VIEW COMPARISON:  09/09/2015 FINDINGS: Normal heart size post CABG. Calcified tortuous aorta. Pulmonary vascularity normal. Bronchitic changes with chronic accentuation of RIGHT upper lobe markings, little changed. Persistent blunting of the LEFT lateral costophrenic angle and mild LEFT basilar atelectasis, with associated small LEFT pleural effusion. Minimal RIGHT basilar atelectasis. No definite acute infiltrate or pneumothorax. Bones diffusely demineralized. IMPRESSION: Post CABG. Aortic atherosclerosis. LEFT pleural effusion with bibasilar atelectasis greater on LEFT. Underlying emphysematous changes with chronic increase in RIGHT upper lobe markings, may represent scarring, unable to exclude recurrent infiltrate. Electronically Signed   By: Ulyses Southward M.D.   On: 07/15/2016 17:48    Procedures Procedures (including critical care time)  Medications Ordered in ED Medications  albuterol (PROVENTIL) (2.5 MG/3ML) 0.083% nebulizer solution 5 mg (5 mg Nebulization Given 07/15/16 1618)     Initial Impression / Assessment and Plan / ED Course  I have reviewed the triage vital signs and the nursing notes.  Pertinent labs & imaging results that were available during my care of the patient were  reviewed by me and considered in my medical decision making (see chart for details).       Final Clinical Impressions(s) / ED Diagnoses   Final diagnoses:  Community acquired pneumonia of right lung, unspecified part of lung    New Prescriptions New Prescriptions   No medications on file  Dr. Rhunette Croft in to see and examine.   Pt to be admitted.    Elson Areas, PA-C 07/16/16 0121  Derwood KaplanAnkit Nanavati, MD 07/24/16 [phone]

## 2016-07-15 NOTE — ED Triage Notes (Addendum)
Pt from home with complaints of cough x 3-4 days, headache 3 days ago and headache 2-3 days. Pt lives at home with her daughter Pt is currently on 2 L and she uses oxygen at night. Pt is unsure why she uses oxygen at night. Pt states she took a medication for her cough today, but is not sure as he daughter manages her medications. Pt denies n/v/d and denies syncope. Pt has clear yet diminished lung sounds and a productive cough with clear/white sputum

## 2016-07-15 NOTE — ED Notes (Signed)
Writer notified EDP of abnormal I-stat lactic result 

## 2016-07-15 NOTE — Telephone Encounter (Signed)
[facility] - Client TELEPHONE ADVICE RECORD [facility] Patient Name: Jasmine [name] DOB: [birth year] Initial Comment Caller states her Mother has a cold. Coughing. Headache. Nurse Assessment Nurse: Lane HackerHarley, RN, Elvin SoWindy Date/Time (Eastern Time): 07/15/2016 2:19:13 PM Confirm and document reason for call. If symptomatic, describe symptoms. ---Caller states her mother has a coughing, some neck pain, and c/ o sinus headache. S/S began started with some coughing 2 nights ago, and worse today. No fever. Does the patient have any new or worsening symptoms? ---Yes Will a triage be completed? ---Yes Related visit to physician within the last 2 weeks? ---No Does the PT have any chronic conditions? (i.e. diabetes, asthma, etc.) ---Yes List chronic conditions. ---CHF, HTN Is this a behavioral health or substance abuse call? ---No Guidelines Guideline Title Affirmed Question Affirmed Notes Sinus Pain or Congestion [1] SEVERE pain AND [2] not improved 2 hours after pain medicine Final Disposition User See Physician within 4 Hours (or PCP triage) Lane HackerHarley, RN, Windy Comments Cough is mainly at night, not having coughing spells. Robitussin DM is being used. [facility]. She will go to ER instead of UC per pt request. Referrals REFERRED TO PCP OFFICE Disagree/Comply: Comply

## 2016-07-15 NOTE — ED Notes (Signed)
Dr. Christopher Danford, hospitalist, at bedside. 

## 2016-07-15 NOTE — Telephone Encounter (Signed)
Per chart review tab pt is at [facility] ED. 

## 2016-07-16 ENCOUNTER — Inpatient Hospital Stay (HOSPITAL_COMMUNITY): Payer: Medicare Other

## 2016-07-16 DIAGNOSIS — J111 Influenza due to unidentified influenza virus with other respiratory manifestations: Secondary | ICD-10-CM

## 2016-07-16 DIAGNOSIS — J9601 Acute respiratory failure with hypoxia: Secondary | ICD-10-CM

## 2016-07-16 DIAGNOSIS — R5383 Other fatigue: Secondary | ICD-10-CM

## 2016-07-16 DIAGNOSIS — E43 Unspecified severe protein-calorie malnutrition: Secondary | ICD-10-CM

## 2016-07-16 DIAGNOSIS — A409 Streptococcal sepsis, unspecified: Secondary | ICD-10-CM

## 2016-07-16 LAB — CBC
HCT: 41.3 % (ref 36.0–46.0)
Hemoglobin: 13.2 g/dL (ref 12.0–15.0)
MCH: 30.6 pg (ref 26.0–34.0)
MCHC: 32 g/dL (ref 30.0–36.0)
MCV: 95.6 fL (ref 78.0–100.0)
PLATELETS: 61 10*3/uL — AB (ref 150–400)
RBC: 4.32 MIL/uL (ref 3.87–5.11)
RDW: 14.3 % (ref 11.5–15.5)
WBC: 6.6 10*3/uL (ref 4.0–10.5)

## 2016-07-16 LAB — LACTIC ACID, PLASMA
LACTIC ACID, VENOUS: 7.1 mmol/L — AB (ref 0.5–1.9)
Lactic Acid, Venous: 4.3 mmol/L (ref 0.5–1.9)
Lactic Acid, Venous: 2 mmol/L (ref 0.5–1.9)

## 2016-07-16 LAB — BLOOD GAS, ARTERIAL
ACID-BASE EXCESS: 2.4 mmol/L — AB (ref 0.0–2.0)
Bicarbonate: 28.3 mmol/L — ABNORMAL HIGH (ref 20.0–28.0)
O2 Content: 2.5 L/min
O2 SAT: 97.7 %
PATIENT TEMPERATURE: 98.6
PCO2 ART: 52.5 mmHg — AB (ref 32.0–48.0)
PO2 ART: 110 mmHg — AB (ref 83.0–108.0)
pH, Arterial: 7.351 (ref 7.350–7.450)

## 2016-07-16 LAB — TROPONIN I
TROPONIN I: 0.05 ng/mL — AB (ref <0.03)
Troponin I: 0.04 ng/mL (ref <0.03)

## 2016-07-16 LAB — BASIC METABOLIC PANEL
ANION GAP: 13 (ref 5–15)
BUN: 15 mg/dL (ref 6–20)
CALCIUM: 8.1 mg/dL — AB (ref 8.9–10.3)
CHLORIDE: 100 mmol/L — AB (ref 101–111)
CO2: 24 mmol/L (ref 22–32)
Creatinine, Ser: 0.99 mg/dL (ref 0.44–1.00)
GFR calc non Af Amer: 48 mL/min — ABNORMAL LOW (ref >60)
GFR, EST AFRICAN AMERICAN: 56 mL/min — AB (ref >60)
Glucose, Bld: 111 mg/dL — ABNORMAL HIGH (ref 65–99)
Potassium: 3.4 mmol/L — ABNORMAL LOW (ref 3.5–5.1)
Sodium: 137 mmol/L (ref 135–145)

## 2016-07-16 LAB — CG4 I-STAT (LACTIC ACID): Lactic Acid, Venous: 3.28 mmol/L (ref 0.5–1.9)

## 2016-07-16 LAB — GLUCOSE, CAPILLARY: GLUCOSE-CAPILLARY: 123 mg/dL — AB (ref 65–99)

## 2016-07-16 LAB — MAGNESIUM: Magnesium: 1.9 mg/dL (ref 1.7–2.4)

## 2016-07-16 LAB — STREP PNEUMONIAE URINARY ANTIGEN: Strep Pneumo Urinary Antigen: POSITIVE — AB

## 2016-07-16 LAB — MRSA PCR SCREENING: MRSA BY PCR: NEGATIVE

## 2016-07-16 LAB — PROCALCITONIN: Procalcitonin: <0.1 ng/mL

## 2016-07-16 LAB — TSH: TSH: 0.834 u[IU]/mL (ref 0.350–4.500)

## 2016-07-16 MED ORDER — LEVALBUTEROL HCL 0.63 MG/3ML IN NEBU: 0.6300 mg | INHALATION_SOLUTION | Freq: Four times a day (QID) | RESPIRATORY_TRACT | Status: DC | PRN

## 2016-07-16 MED ORDER — SODIUM CHLORIDE 0.9 % IV BOLUS (SEPSIS)
500.0000 mL | Freq: Once | INTRAVENOUS | Status: AC
  Administered 2016-07-16: 500 mL via INTRAVENOUS

## 2016-07-16 MED ORDER — OSELTAMIVIR PHOSPHATE 75 MG PO CAPS: 75.0000 mg | ORAL_CAPSULE | Freq: Two times a day (BID) | ORAL | Status: DC

## 2016-07-16 MED ORDER — METOPROLOL TARTRATE 5 MG/5ML IV SOLN
5.0000 mg | Freq: Once | INTRAVENOUS | Status: AC
  Administered 2016-07-16: 5 mg via INTRAVENOUS
  Filled 2016-07-16: qty 5

## 2016-07-16 MED ORDER — DILTIAZEM HCL 30 MG PO TABS
30.0000 mg | ORAL_TABLET | Freq: Three times a day (TID) | ORAL | Status: DC
  Administered 2016-07-16: 30 mg via ORAL
  Filled 2016-07-16: qty 1

## 2016-07-16 MED ORDER — ADULT MULTIVITAMIN W/MINERALS CH
1.0000 | ORAL_TABLET | Freq: Every day | ORAL | Status: DC
  Administered 2016-07-17 – 2016-07-21 (×5): 1 via ORAL
  Filled 2016-07-16 (×5): qty 1

## 2016-07-16 MED ORDER — SODIUM CHLORIDE 0.9 % IV BOLUS (SEPSIS)
1000.0000 mL | Freq: Once | INTRAVENOUS | Status: AC
  Administered 2016-07-16: 1000 mL via INTRAVENOUS

## 2016-07-16 MED ORDER — OSELTAMIVIR PHOSPHATE 30 MG PO CAPS
30.0000 mg | ORAL_CAPSULE | Freq: Every day | ORAL | Status: DC
Start: 2016-07-16 — End: 2016-07-17
  Administered 2016-07-16 (×2): 30 mg via ORAL
  Filled 2016-07-16 (×2): qty 1

## 2016-07-16 MED ORDER — ENSURE ENLIVE PO LIQD
237.0000 mL | Freq: Two times a day (BID) | ORAL | Status: DC
  Administered 2016-07-17 – 2016-07-21 (×8): 237 mL via ORAL

## 2016-07-16 MED ORDER — METOPROLOL TARTRATE 5 MG/5ML IV SOLN
2.5000 mg | Freq: Once | INTRAVENOUS | Status: AC
  Administered 2016-07-16: 2.5 mg via INTRAVENOUS
  Filled 2016-07-16: qty 5

## 2016-07-16 MED ORDER — OSELTAMIVIR PHOSPHATE 30 MG PO CAPS: 30.0000 mg | ORAL_CAPSULE | Freq: Two times a day (BID) | ORAL | Status: DC

## 2016-07-16 MED ORDER — AMMONIA AROMATIC IN INHA
RESPIRATORY_TRACT | Status: AC
  Administered 2016-07-16: 17:00:00
  Filled 2016-07-16: qty 10

## 2016-07-16 MED ORDER — BISOPROLOL FUMARATE 5 MG PO TABS
10.0000 mg | ORAL_TABLET | Freq: Every morning | ORAL | Status: DC
  Administered 2016-07-16 – 2016-07-21 (×6): 10 mg via ORAL
  Filled 2016-07-16 (×7): qty 2

## 2016-07-16 NOTE — Evaluation (Signed)
Physical Therapy Evaluation Patient Details Name: Jasmine [name] MRN: [id number] DOB: August 05, 1922 Today's Date: 07/16/2016   History of Present Illness   Jasmine [name] is a 83 y.o. female with a past medical history significant for CAD s/p CABG, HFpEF on noct O2, Afib not on AC, hypothyroidism, and HTN who presents with few days worsening cough and weakness.  Chest xray: LEFT pleural effusion with bibasilar atelectasis greater on LEFT   Clinical Impression  The patient is very pleasant. No family present to discuss DC plan and caregiver availability. Pt admitted with above diagnosis. Pt currently with functional limitations due to the deficits listed below (see PT Problem List).  Pt will benefit from skilled PT to increase their independence and safety with mobility to allow discharge to the venue listed below.       Follow Up Recommendations SNF;Home health PT;Supervision/Assistance - 24 hour (need to speak with daughter)    Equipment Recommendations  None recommended by PT    Recommendations for Other Services       Precautions / Restrictions Precautions Precautions: Fall Precaution Comments: monitor sats, on O2 at  night      Mobility  Bed Mobility Overal bed mobility: Needs Assistance Bed Mobility: Supine to Sit;Sit to Supine     Supine to sit: Mod assist Sit to supine: Mod assist   General bed mobility comments: assisyt  with trunk and legs   Transfers Overall transfer level: Needs assistance Equipment used: 1 person hand held assist Transfers: Sit to/from Stand Sit to Stand: Mod assist         General transfer comment: stood from bed x 1 minute. Assist to rise. Having loose BM so did not attempt ambulation  Ambulation/Gait                Stairs            Wheelchair Mobility    Modified Rankin (Stroke Patients Only)       Balance Overall balance assessment: Needs assistance Sitting-balance support: Bilateral upper extremity supported;Feet  supported Sitting balance-Leahy Scale: Fair     Standing balance support: Bilateral upper extremity supported;During functional activity Standing balance-Leahy Scale: Poor                               Pertinent Vitals/Pain Pain Assessment: No/denies pain    Home Living Family/patient expects to be discharged to:: Private residence Living Arrangements: Children Available Help at Discharge: Family;Available 24 hours/day Type of Home: House Home Access: Stairs to enter Entrance Stairs-Rails: Right;Left (taken from another encounter.) Entrance Stairs-Number of Steps: 3 Home Layout: One level Home Equipment: Walker - 2 wheels      Prior Function Level of Independence: Needs assistance;Independent with assistive device(s)   Gait / Transfers Assistance Needed: uses RW,  ADL's / Homemaking Assistance Needed: assist with sponge bath        Hand Dominance        Extremity/Trunk Assessment   Upper Extremity Assessment Upper Extremity Assessment: Generalized weakness    Lower Extremity Assessment Lower Extremity Assessment: Generalized weakness    Cervical / Trunk Assessment Cervical / Trunk Assessment: Kyphotic  Communication      Cognition Arousal/Alertness: Awake/alert Behavior During Therapy: WFL for tasks assessed/performed Overall Cognitive Status: History of cognitive impairments - at baseline  General Comments: not oriented to place nor time      General Comments      Exercises     Assessment/Plan    PT Assessment Patient needs continued PT services  PT Problem List Decreased strength;Decreased activity tolerance;Decreased mobility;Decreased knowledge of precautions;Decreased safety awareness;Decreased knowledge of use of DME;Decreased cognition       PT Treatment Interventions DME instruction;Gait training;Functional mobility training;Therapeutic exercise;Therapeutic  activities;Patient/family education    PT Goals (Current goals can be found in the Care Plan section)  Acute Rehab PT Goals Patient Stated Goal: did not state PT Goal Formulation: Patient unable to participate in goal setting Time For Goal Achievement: 07/30/16 Potential to Achieve Goals: Good    Frequency Min 3X/week   Barriers to discharge        Co-evaluation               End of Session Equipment Utilized During Treatment: Oxygen Activity Tolerance: Patient tolerated treatment well Patient left: in bed;with call bell/phone within reach;with bed alarm set Nurse Communication: Mobility status PT Visit Diagnosis: Difficulty in walking, not elsewhere classified (R26.2)    Time: 1000-1017 PT Time Calculation (min) (ACUTE ONLY): 17 min   Charges:   PT Evaluation $PT Eval Low Complexity: 1 Procedure     PT G CodesBlanchard Kelch PT [phone]   Rada Hay 07/16/2016, 12:09 PM

## 2016-07-16 NOTE — Progress Notes (Signed)
[facility] NP Craige CottaKirby paged for multiple issues: Critical lactic of 4.3, increased blood pressure of 172/109, patient's bladder scan of >950cc of urine, and patient's 3 small, loose bowel movements.  Orders for 500cc bolus, 5mg  IV lopressor, and foley insertion received.

## 2016-07-16 NOTE — Telephone Encounter (Signed)
ER note reviewed

## 2016-07-16 NOTE — Progress Notes (Signed)
MD paged and spoke with about patient being only responsive to pain. This AM patient was alert out of bed and oriented to self. MAR reviewed and no sedatives given. Klonopin given as ordered but patients daughter says she never responds like this to her medications. BP noted to be 125/47 which is significantly lower than her BP of 180's this am. MD assessed patient at bedside and ordered a 500 cc bolus. Bolus infusing now will call MD with post bolus BP.

## 2016-07-16 NOTE — Progress Notes (Signed)
CRITICAL VALUE ALERT  Critical value received:  Lactic Acid 7.1  Date of notification:  07/16/2016  Time of notification:  0021  Critical value read back:Yes.    Nurse who received alert:  J.Tyeson Tanimoto  MD notified (1st page):  K.Kirby  Time of first page:  0021  MD notified (2nd page):  Time of second page:  Responding MD:  [email or web address]  Time MD responded:  0040

## 2016-07-16 NOTE — Care Management Note (Signed)
Case Management Note  Patient Details  Name: Jasmine [name] MRN: [id number] Date of Birth: April 06, 1923  Subjective/Objective:       Flu type a, urine positive for strep pneumonia             Action/Plan: Date:  July 16, 2016 Chart reviewed for concurrent status and case management needs. Will continue to follow patient progress. Discharge Planning: following for needs Expected discharge date: [id number] Marcelle SmilingRhonda Davis, BSN, [place]3, [place]   [phone]  Expected Discharge Date:   (unknown)               Expected Discharge Plan:  Home/Self Care  In-House Referral:     Discharge planning Services     Post Acute Care Choice:    Choice offered to:     DME Arranged:    DME Agency:     HH Arranged:    HH Agency:     Status of Service:  In process, will continue to follow  If discussed at Long Length of Stay Meetings, dates discussed:    Additional Comments:  Golda AcreDavis, Rhonda Lynn, RN 07/16/2016, 9:43 AM

## 2016-07-16 NOTE — Progress Notes (Signed)
Initial Nutrition Assessment  DOCUMENTATION CODES:   Severe malnutrition in context of chronic illness, Underweight  INTERVENTION:  - Will order Ensure Enlive po BID, each supplement provides 350 kcal and 20 grams of protein - Will order daily multivitamin with minerals. - Continue to encourage PO intakes.  - RD will continue to monitor for needs.  NUTRITION DIAGNOSIS:   Malnutrition related to chronic illness as evidenced by severe depletion of muscle mass, severe depletion of body fat.  GOAL:   Patient will meet greater than or equal to 90% of their needs  MONITOR:   PO intake, Supplement acceptance, Weight trends, Labs  REASON FOR ASSESSMENT:   Malnutrition Screening Tool  ASSESSMENT:   83 y.o. female with a past medical history significant for CAD s/p CABG, HFpEF on noct O2, Afib not on AC, hypothyroidism, and HTN who presents with few days worsening cough and weakness.  Pt seen for MST. BMI indicates underweight status. Pt did not arouse to name call or physical assessment by RD. No family/visitors present. RN entered the room and reports that pt has been unresponsive this afternoon; she performed sternal rub with name call and pt continued to sleep soundly. She reports that this is new for pt. RN states pt drank Ensure this AM and did not want any breakfast food ordered. Pt did have some coughing while drinking but pt has been having ongoing cough and coughing up bloody sputum. RN reports that pt's daughter reported pt likes very sugary cereal; this AM pt did not want Fruit Loops with sugar added as it was not sweet enough for her.   Physical assessment shows moderate and severe muscle and severe fat wasting. Per chart review, current body weight consistent with weight from October and December 2016. Pt lost weight in December 2016 and then regained weight from that time to current.   Medications reviewed; 88 mcg oral Synthroid/day, 40 mg Deltasone/day. Labs reviewed; K:  3.4 mmol/L, Cl: 100 mmol/L, Ca: 8.1 mg/dL, GFR: 48 mL/min.   IVF: NS @ 100 mL/hr.    Diet Order:  Diet Heart Room service appropriate? Yes; Fluid consistency: Thin  Skin:  Reviewed, no issues  Last BM:  3/29  Height:   Ht Readings from Last 1 Encounters:  07/16/16 5\' 5"  (1.651 m)    Weight:   Wt Readings from Last 1 Encounters:  07/16/16 106 lb 7.7 oz (48.3 kg)    Ideal Body Weight:  56.82 kg  BMI:  Body mass index is 17.72 kg/m.  Estimated Nutritional Needs:   Kcal:  1110-1350 (23-28 kcal/kg  Protein:  48-58 grams (1-1.2 grams/kg)  Fluid:  >/= 1.3 L/day  EDUCATION NEEDS:   No education needs identified at this time    Trenton GammonJessica Broady Lafoy, MS, RD, LDN, CNSC Inpatient Clinical Dietitian Pager # [phone] After hours/weekend pager # [phone]

## 2016-07-16 NOTE — Progress Notes (Addendum)
PROGRESS NOTE    Jasmine [name]   JYN:[id number]  DOB: [birth year]  DOA: 07/15/2016 PCP: Nicki Reaper, NP   Brief Narrative:   Jasmine [name] is a 83 y.o. female with a past medical history significant for CAD s/p CABG, HFpEF on noct O2, Afib not on AC, hypothyroidism, and HTN who presents with few days worsening cough and weakness.  The patient was in her usual state of health until the middle of last week when she developed cough. Then the last 3-4 days she has had worsening cough, and now accompanied by progressive malaise, chills, headache, sweats, and weakness, to the point that she can barely get out of bed, sit today her daughter brought her to the emergency room.  Subjective: She tells me she has been coughing up brown sputum. Has coughed it up in my presence. No chest pain. Focused on getting out of bed to have a bowel movement although she was just on a bedpan which I removed. It had a very small amount of watery stool.   Assessment & Plan:   Principal Problem:   Sepsis - tachycardia tachypnea  Influenza +  Possible b/l basilar pneumonia - cont Rocephin, Zithromax, Tamiflu, Solumedrol, Nebs -  Strep pneumo antigen + - very congested- ad aflutter valve - wean O2 as able- not sure why she is on bed time O2- PCP notes mention it's per cardiology, Dr Hazle Coca notes do not mention it and I see no reason why someone with dCHF would need O2 at night  Active Problems:  Lethargy - became unresponsive in the middle of the day- ? Due to sudden drop in BP- given 500 cc bolus x 2 - evaluated 2 more times in the day - ABG unrevealing, head CT negative - close to 4 PM, was able to arouse with ammonia -  hold Clonazepam while sleepy    Chronic atrial fibrillation  - not on anticoagulation due to fall risk - HR up today likely due to acute resp failure - cont Cardizem at bedtime-   - cont Zebeta as well    CAD - CABG '97, cath OK 2005, Myoview low risk 12/12 - Follows with Dr Allyson Sabal  and was noted to be stable     HTN (hypertension) - Cardizem, Zebeta    Chronic diastolic CHF (congestive heart failure)  - has been on 100 cc/hr - given a total of 1 L in boluses- hold further IVF for now- reassess in AM    Thrombocytopenia   - chronic issue    Hypothyroidism - Synthroid  Anxiety/depression - Clonazepam, Seroquel per home dose  DVT prophylaxis: SCDs Code Status: DNR Family Communication:  Disposition Plan: home when stable Consultants:   none Procedures:    Antimicrobials:  Anti-infectives    Start     Dose/Rate Route Frequency Ordered Stop   07/16/16 1000  oseltamivir (TAMIFLU) capsule 30 mg  Status:  Discontinued     30 mg Oral 2 times daily 07/16/16 0106 07/16/16 0106   07/16/16 0115  oseltamivir (TAMIFLU) capsule 30 mg     30 mg Oral Daily at bedtime 07/16/16 0106 07/20/16 2159   07/16/16 0100  oseltamivir (TAMIFLU) capsule 75 mg  Status:  Discontinued     75 mg Oral 2 times daily 07/16/16 0054 07/16/16 0106   07/15/16 2130  cefTRIAXone (ROCEPHIN) 1 g in dextrose 5 % 50 mL IVPB     1 g 100 mL/hr over 30 Minutes Intravenous Every 24 hours 07/15/16 2122  07/22/16 2129   07/15/16 2130  azithromycin (ZITHROMAX) 500 mg in dextrose 5 % 250 mL IVPB     500 mg 250 mL/hr over 60 Minutes Intravenous Every 24 hours 07/15/16 2122 07/22/16 2129       Objective: Vitals:   07/16/16 0730 07/16/16 0800 07/16/16 0900 07/16/16 1000  BP: (!) 181/89 (!) 174/82  140/82  Pulse: 93 (!) 108 (!) 117 100  Resp: (!) 23 (!) 28 (!) 30 (!) 25  Temp:  98.7 F (37.1 C)    TempSrc:  Oral    SpO2: 95% 95% 93% 100%  Weight:      Height:        Intake/Output Summary (Last 24 hours) at 07/16/16 1143 Last data filed at 07/16/16 1000  Gross per 24 hour  Intake          3676.25 ml  Output             1250 ml  Net          2426.25 ml   Filed Weights   07/15/16 2023 07/15/16 2218 07/16/16 0100  Weight: 46.7 kg (103 lb) 48.9 kg (107 lb 12.9 oz) 48.3 kg (106 lb 7.7 oz)     Examination: General exam: Appears comfortable  HEENT: PERRLA, oral mucosa moist, no sclera icterus or thrush Respiratory system: rhonchi, Respiratory effort normal. Cardiovascular system: S1 & S2 heard, RRR.  No murmurs  Gastrointestinal system: Abdomen soft, non-tender, nondistended. Normal bowel sound. No organomegaly Central nervous system: Alert and oriented. No focal neurological deficits. Extremities: No cyanosis, clubbing or edema Skin: No rashes or ulcers Psychiatry:  Mood & affect appropriate.     Data Reviewed: I have personally reviewed following labs and imaging studies  CBC:  Recent Labs Lab 07/15/16 1627 07/16/16 0317  WBC 4.3 6.6  NEUTROABS 3.2  --   HGB 13.0 13.2  HCT 38.9 41.3  MCV 91.5 95.6  PLT 80* 61*   Basic Metabolic Panel:  Recent Labs Lab 07/15/16 1627 07/15/16 2343 07/16/16 0317  NA 136  --  137  K 3.8  --  3.4*  CL 96*  --  100*  CO2 31  --  24  GLUCOSE 128*  --  111*  BUN 16  --  15  CREATININE 1.06*  --  0.99  CALCIUM 8.8*  --  8.1*  MG  --  1.9  --    GFR: Estimated Creatinine Clearance: 28.2 mL/min (by C-G formula based on SCr of 0.99 mg/dL). Liver Function Tests:  Recent Labs Lab 07/15/16 1627  AST 48*  ALT 21  ALKPHOS 89  BILITOT 1.0  PROT 7.0  ALBUMIN 4.1   No results for input(s): LIPASE, AMYLASE in the last 168 hours. No results for input(s): AMMONIA in the last 168 hours. Coagulation Profile: No results for input(s): INR, PROTIME in the last 168 hours. Cardiac Enzymes:  Recent Labs Lab 07/15/16 1627 07/15/16 2343 07/16/16 0317  TROPONINI 0.03* 0.04* 0.05*   BNP (last 3 results) No results for input(s): PROBNP in the last 8760 hours. HbA1C: No results for input(s): HGBA1C in the last 72 hours. CBG: No results for input(s): GLUCAP in the last 168 hours. Lipid Profile: No results for input(s): CHOL, HDL, LDLCALC, TRIG, CHOLHDL, LDLDIRECT in the last 72 hours. Thyroid Function Tests:  Recent  Labs  07/15/16 2307  TSH 0.834   Anemia Panel: No results for input(s): VITAMINB12, FOLATE, FERRITIN, TIBC, IRON, RETICCTPCT in the last 72 hours. Urine analysis:  Component Value Date/Time   COLORURINE YELLOW 07/15/2016 2125   APPEARANCEUR HAZY (A) 07/15/2016 2125   LABSPEC 1.011 07/15/2016 2125   PHURINE 6.0 07/15/2016 2125   GLUCOSEU NEGATIVE 07/15/2016 2125   HGBUR LARGE (A) 07/15/2016 2125   HGBUR 1+ 05/06/2010 1520   BILIRUBINUR NEGATIVE 07/15/2016 2125   KETONESUR NEGATIVE 07/15/2016 2125   PROTEINUR NEGATIVE 07/15/2016 2125   UROBILINOGEN 1.0 01/22/2015 1721   NITRITE NEGATIVE 07/15/2016 2125   LEUKOCYTESUR NEGATIVE 07/15/2016 2125   Sepsis Labs: @LABRCNTIP (procalcitonin:4,lacticidven:4) ) Recent Results (from the past 240 hour(s))  Blood Culture (routine x 2)     Status: None (Preliminary result)   Collection Time: 07/15/16  7:49 PM  Result Value Ref Range Status   Specimen Description   Final    BLOOD LEFT ANTECUBITAL Performed at [facility], [address] [place], [place] [address]    Special Requests IN PEDIATRIC BOTTLE 1CC  Final   Culture PENDING  Incomplete   Report Status PENDING  Incomplete  Blood Culture (routine x 2)     Status: None (Preliminary result)   Collection Time: 07/15/16  7:49 PM  Result Value Ref Range Status   Specimen Description   Final    BLOOD LEFT ANTECUBITAL Performed at [facility], [address] [place], [place] [address]    Special Requests IN PEDIATRIC BOTTLE 1CC  Final   Culture PENDING  Incomplete   Report Status PENDING  Incomplete  MRSA PCR Screening     Status: None   Collection Time: 07/16/16  1:48 AM  Result Value Ref Range Status   MRSA by PCR NEGATIVE NEGATIVE Final    Comment:        The GeneXpert MRSA Assay (FDA approved for NASAL specimens only), is one component of a comprehensive MRSA colonization surveillance program. It is not intended to diagnose MRSA infection nor to guide  or monitor treatment for MRSA infections.          Radiology Studies: Dg Chest 2 View  Result Date: 07/15/2016 CLINICAL DATA:  Cough for 3-4 days, headache for 2-3 days, atrial fibrillation, coronary artery disease, hypertension, GERD, former smoker EXAM: CHEST  2 VIEW COMPARISON:  09/09/2015 FINDINGS: Normal heart size post CABG. Calcified tortuous aorta. Pulmonary vascularity normal. Bronchitic changes with chronic accentuation of RIGHT upper lobe markings, little changed. Persistent blunting of the LEFT lateral costophrenic angle and mild LEFT basilar atelectasis, with associated small LEFT pleural effusion. Minimal RIGHT basilar atelectasis. No definite acute infiltrate or pneumothorax. Bones diffusely demineralized. IMPRESSION: Post CABG. Aortic atherosclerosis. LEFT pleural effusion with bibasilar atelectasis greater on LEFT. Underlying emphysematous changes with chronic increase in RIGHT upper lobe markings, may represent scarring, unable to exclude recurrent infiltrate. Electronically Signed   By: Ulyses SouthwardMark  Boles M.D.   On: 07/15/2016 17:48   Ct Head Wo Contrast  Result Date: 07/15/2016 CLINICAL DATA:  Cough and headache x3 days. EXAM: CT HEAD WITHOUT CONTRAST TECHNIQUE: Contiguous axial images were obtained from the base of the skull through the vertex without intravenous contrast. COMPARISON:  01/22/2015 CT FINDINGS: BRAIN: There is sulcal prominence consistent with superficial atrophy similar to prior exam. No intraparenchymal hemorrhage, mass effect nor midline shift. Periventricular and subcortical white matter hypodensities consistent with chronic mild small vessel ischemic disease are identified. No acute large vascular territory infarcts. No abnormal extra-axial fluid collections. Basal cisterns are not effaced and midline. VASCULAR: Moderate calcific atherosclerosis of the carotid siphons and both vertebral arteries. SKULL: No skull fracture.  No significant scalp soft tissue swelling.  SINUSES/ORBITS: The mastoid air-cells are clear. The included paranasal sinuses are well-aerated.The included ocular globes and orbital contents are non-suspicious. OTHER: None. IMPRESSION: Mild superficial atrophy with chronic small vessel ischemic disease periventricular white matter. No acute intracranial abnormality noted. Electronically Signed   By: Tollie Eth M.D.   On: 07/15/2016 20:07      Scheduled Meds: . aspirin EC  81 mg Oral Q breakfast  . atorvastatin  80 mg Oral Daily  . azithromycin  500 mg Intravenous Q24H  . bisoprolol  10 mg Oral q morning - 10a  . cefTRIAXone (ROCEPHIN)  IV  1 g Intravenous Q24H  . clonazePAM  0.5 mg Oral 2 times per day  . clonazePAM  1 mg Oral QHS  . diltiazem  120 mg Oral QHS  . diltiazem  30 mg Oral Q8H  . levothyroxine  88 mcg Oral QAC breakfast  . mirtazapine  45 mg Oral QHS  . oseltamivir  30 mg Oral QHS  . predniSONE  40 mg Oral Q breakfast  . QUEtiapine  25 mg Oral QHS   Continuous Infusions: . sodium chloride 100 mL/hr at 07/16/16 1000     LOS: 1 day    Time spent in minutes: 50 min evaluated patient 3 times- spoke with daughter in person twice    Calvert Cantor, MD [facility] Pager: [email or web address] Password [facility] 07/16/2016, 11:43 AM

## 2016-07-16 NOTE — Progress Notes (Signed)
Patient remaining minimally responsive after bolus and BP remaining 110s, additional vital signs stable, blood sugar checked and was stable at 123, ABG checked by respiratory- results grossly WNL. MD aware and ordered a second 500cc bolus.

## 2016-07-17 LAB — BASIC METABOLIC PANEL
Anion gap: 10 (ref 5–15)
BUN: 17 mg/dL (ref 6–20)
CALCIUM: 8 mg/dL — AB (ref 8.9–10.3)
CHLORIDE: 105 mmol/L (ref 101–111)
CO2: 25 mmol/L (ref 22–32)
CREATININE: 0.82 mg/dL (ref 0.44–1.00)
GFR calc Af Amer: >60 mL/min (ref >60)
GFR calc non Af Amer: >60 mL/min (ref >60)
GLUCOSE: 75 mg/dL (ref 65–99)
Potassium: 3.5 mmol/L (ref 3.5–5.1)
Sodium: 140 mmol/L (ref 135–145)

## 2016-07-17 LAB — PROCALCITONIN: Procalcitonin: 0.17 ng/mL

## 2016-07-17 LAB — CBC
HCT: 37.8 % (ref 36.0–46.0)
HEMOGLOBIN: 12.3 g/dL (ref 12.0–15.0)
MCH: 31.3 pg (ref 26.0–34.0)
MCHC: 32.5 g/dL (ref 30.0–36.0)
MCV: 96.2 fL (ref 78.0–100.0)
PLATELETS: 69 10*3/uL — AB (ref 150–400)
RBC: 3.93 MIL/uL (ref 3.87–5.11)
RDW: 14.4 % (ref 11.5–15.5)
WBC: 8.7 10*3/uL (ref 4.0–10.5)

## 2016-07-17 LAB — URINE CULTURE

## 2016-07-17 MED ORDER — OSELTAMIVIR PHOSPHATE 30 MG PO CAPS
30.0000 mg | ORAL_CAPSULE | Freq: Two times a day (BID) | ORAL | Status: AC
Start: 2016-07-17 — End: 2016-07-20
  Administered 2016-07-17 – 2016-07-20 (×8): 30 mg via ORAL
  Filled 2016-07-17 (×9): qty 1

## 2016-07-17 NOTE — Progress Notes (Signed)
Patient Demographics:    Jasmine [name], is a 83 y.o. female, DOB - [birth year], ZOX:[id number]  Admit date - 07/15/2016   Admitting Physician Alberteen Sam, MD  Outpatient Primary MD for the patient is Nicki Reaper, NP  LOS - 2   Chief Complaint  Patient presents with  . Shortness of Breath  . Cough  . Headache        Subjective:    Jasmine [name] today has no fevers, no emesis,  No chest pain,  More awake,    Assessment  & Plan :    Principal Problem:   Sepsis (HCC) Active Problems:   Hypothyroidism   CAD - CABG '97, cath OK 2005, Myoview low risk 12/12   HTN (hypertension)   Chronic atrial fibrillation (HCC)   Chronic diastolic CHF (congestive heart failure) (HCC)   Thrombocytopenia (HCC)   COPD with acute exacerbation (HCC)   Influenza   Acute respiratory failure with hypoxemia (HCC)   Protein-calorie malnutrition, severe   1)Sepsis -Secondary to right-sided strep pneumonia as well as influenza A, tachycardia, hypotension and tachypnea has improved,  continue Tamiflu, stop azithromycin, continue Rocephin for strep pneumo, continue flutter valve ,  continue supplemental oxygen, Lethargy has resolved (was most likely due to benzos)   2)Acute and Chronic Hypoxic Respiratory Failure- secondary to #1 above in a patient with underlying diastolic CHF, improving, prior to admission patient was using oxygen on a daily at bedtime, she is using oxygen continuously now  3) Chronic atrial fibrillation - continue Cardizem and bisoprolol for rate control, not on anticoagulation due to concerns about increased risk of falling   4)CAD - prior CABG '97, cath OK 2005, Myoview low risk 03/31/16, patient's cardiologist is Dr. Allyson Sabal, no ACS type symptoms at this time   5)HFpEF/history of Chronic diastolic CHF (congestive heart failure) -  no evidence of acute exacerbation at this time, last known EF  55-60% based on echocardiogram from October 2016, be judicious with IV fluids to avoid volume overload  6)Chronic  Thrombocytopenia  -no evidence of bleeding , patient is not on anticoagulation  7)Anxiety/depression- patient had excessive lethargy due to benzos, be judicious with benzodiazepine and Seroquel  8)Dispo- plan of care discussed with patient's daughter, who is patient's primary caregiver and lives with patient, she is requesting possible transfer to skilled nursing facility for rehabilitation prior to returning home. Physical therapy eval and social work consult requested  DVT prophylaxis: SCDs Code Status: DNR Family Communication:  Disposition Plan:  ??? SNF Consultants:   none Procedures:     Lab Results  Component Value Date   PLT 69 (L) 07/17/2016    Inpatient Medications  Scheduled Meds: . aspirin EC  81 mg Oral Q breakfast  . atorvastatin  80 mg Oral Daily  . bisoprolol  10 mg Oral q morning - 10a  . cefTRIAXone (ROCEPHIN)  IV  1 g Intravenous Q24H  . clonazePAM  0.5 mg Oral 2 times per day  . clonazePAM  1 mg Oral QHS  . diltiazem  120 mg Oral QHS  . feeding supplement (ENSURE ENLIVE)  237 mL Oral BID BM  . levothyroxine  88 mcg Oral QAC breakfast  . mirtazapine  45 mg Oral QHS  . multivitamin  with minerals  1 tablet Oral Daily  . oseltamivir  30 mg Oral BID  . predniSONE  40 mg Oral Q breakfast  . QUEtiapine  25 mg Oral QHS   Continuous Infusions: PRN Meds:.acetaminophen **OR** acetaminophen, dextromethorphan-guaiFENesin, levalbuterol, ondansetron **OR** ondansetron (ZOFRAN) IV    Anti-infectives    Start     Dose/Rate Route Frequency Ordered Stop   07/17/16 1000  oseltamivir (TAMIFLU) capsule 30 mg     30 mg Oral 2 times daily 07/17/16 0800 07/21/16 0959   07/16/16 1000  oseltamivir (TAMIFLU) capsule 30 mg  Status:  Discontinued     30 mg Oral 2 times daily 07/16/16 0106 07/16/16 0106   07/16/16 0115  oseltamivir (TAMIFLU) capsule 30 mg   Status:  Discontinued     30 mg Oral Daily at bedtime 07/16/16 0106 07/17/16 0800   07/16/16 0100  oseltamivir (TAMIFLU) capsule 75 mg  Status:  Discontinued     75 mg Oral 2 times daily 07/16/16 0054 07/16/16 0106   07/15/16 2130  cefTRIAXone (ROCEPHIN) 1 g in dextrose 5 % 50 mL IVPB     1 g 100 mL/hr over 30 Minutes Intravenous Every 24 hours 07/15/16 2122 07/22/16 2129   07/15/16 2130  azithromycin (ZITHROMAX) 500 mg in dextrose 5 % 250 mL IVPB  Status:  Discontinued     500 mg 250 mL/hr over 60 Minutes Intravenous Every 24 hours 07/15/16 2122 07/17/16 0920        Objective:   Vitals:   07/17/16 0900 07/17/16 1000 07/17/16 1100 07/17/16 1200  BP: (!) 155/77 (!) 163/72 (!) 128/54 132/64  Pulse: 91 89 96 78  Resp: (!) 23 (!) Temp:    98.7 F (37.1 C)  TempSrc:    Axillary  SpO2: 95% 100% 96% 97%  Weight:      Height:        Wt Readings from Last 3 Encounters:  07/17/16 51 kg (112 lb 7 oz)  05/22/16 46.9 kg (103 lb 8 oz)  01/29/16 47.6 kg (105 lb)     Intake/Output Summary (Last 24 hours) at 07/17/16 1353 Last data filed at 07/16/16 2216  Gross per 24 hour  Intake          1204.16 ml  Output              200 ml  Net          1004.16 ml     Physical Exam  Gen:- Awake Alert,  In NAD HEENT:- [place].AT, No sclera icterus Neck-Supple Neck,No JVD,.  Lungs-  diminished in bases, scattered rhonchi on the right  CV- S1, S2 normal, irregular Abd-  +ve B.Sounds, Abd Soft, No tenderness,    Extremity/Skin:- Neg Homan's    Data Review:   Micro Results Recent Results (from the past 240 hour(s))  Blood Culture (routine x 2)     Status: None (Preliminary result)   Collection Time: 07/15/16  7:49 PM  Result Value Ref Range Status   Specimen Description BLOOD LEFT ANTECUBITAL  Final   Special Requests IN PEDIATRIC BOTTLE 1CC  Final   Culture   Final    NO GROWTH < 24 HOURS Performed at [facility], [address], [place], [place] [address]    Report  Status PENDING  Incomplete  Blood Culture (routine x 2)     Status: None (Preliminary result)   Collection Time: 07/15/16  7:49 PM  Result Value Ref Range Status  Specimen Description BLOOD LEFT ANTECUBITAL  Final   Special Requests IN PEDIATRIC BOTTLE 1CC  Final   Culture   Final    NO GROWTH < 24 HOURS Performed at [facility], [address] [place], [place] [address]    Report Status PENDING  Incomplete  Culture, Urine     Status: Abnormal   Collection Time: 07/15/16  9:25 PM  Result Value Ref Range Status   Specimen Description URINE, CLEAN CATCH  Final   Special Requests NONE  Final   Culture MULTIPLE SPECIES PRESENT, SUGGEST RECOLLECTION (A)  Final   Report Status 07/17/2016 FINAL  Final  MRSA PCR Screening     Status: None   Collection Time: 07/16/16  1:48 AM  Result Value Ref Range Status   MRSA by PCR NEGATIVE NEGATIVE Final    Comment:        The GeneXpert MRSA Assay (FDA approved for NASAL specimens only), is one component of a comprehensive MRSA colonization surveillance program. It is not intended to diagnose MRSA infection nor to guide or monitor treatment for MRSA infections.     Radiology Reports Dg Chest 2 View  Result Date: 07/15/2016 CLINICAL DATA:  Cough for 3-4 days, headache for 2-3 days, atrial fibrillation, coronary artery disease, hypertension, GERD, former smoker EXAM: CHEST  2 VIEW COMPARISON:  09/09/2015 FINDINGS: Normal heart size post CABG. Calcified tortuous aorta. Pulmonary vascularity normal. Bronchitic changes with chronic accentuation of RIGHT upper lobe markings, little changed. Persistent blunting of the LEFT lateral costophrenic angle and mild LEFT basilar atelectasis, with associated small LEFT pleural effusion. Minimal RIGHT basilar atelectasis. No definite acute infiltrate or pneumothorax. Bones diffusely demineralized. IMPRESSION: Post CABG. Aortic atherosclerosis. LEFT pleural effusion with bibasilar atelectasis greater on  LEFT. Underlying emphysematous changes with chronic increase in RIGHT upper lobe markings, may represent scarring, unable to exclude recurrent infiltrate. Electronically Signed   By: Ulyses Southward M.D.   On: 07/15/2016 17:48   Ct Head Wo Contrast  Result Date: 07/16/2016 CLINICAL DATA:  Altered mental status, lethargy, hypertension, coronary artery disease, atrial fibrillation EXAM: CT HEAD WITHOUT CONTRAST TECHNIQUE: Contiguous axial images were obtained from the base of the skull through the vertex without intravenous contrast. COMPARISON:  07/15/2016 FINDINGS: Brain: Generalized atrophy. Normal ventricular morphology. No midline shift or mass effect. Question new lacunar infarct at the LEFT external capsule. Question additional lacunar infarct at LEFT thalamus. Small vessel chronic ischemic changes of deep cerebral white matter. No acute cortical infarction identified. No intracranial hemorrhage, mass lesion, or extra-axial fluid collection. Vascular: Atherosclerotic calcification of the internal carotid and vertebral arteries at the skullbase. Skull: Diffuse osseous demineralization Sinuses/Orbits: Cleared Other: N/A IMPRESSION: Atrophy with small vessel chronic ischemic changes of deep cerebral white matter. Questionable lacunar infarcts at the LEFT thalamus and LEFT external capsule. Electronically Signed   By: Ulyses Southward M.D.   On: 07/16/2016 17:16   Ct Head Wo Contrast  Result Date: 07/15/2016 CLINICAL DATA:  Cough and headache x3 days. EXAM: CT HEAD WITHOUT CONTRAST TECHNIQUE: Contiguous axial images were obtained from the base of the skull through the vertex without intravenous contrast. COMPARISON:  01/22/2015 CT FINDINGS: BRAIN: There is sulcal prominence consistent with superficial atrophy similar to prior exam. No intraparenchymal hemorrhage, mass effect nor midline shift. Periventricular and subcortical white matter hypodensities consistent with chronic mild small vessel ischemic disease are  identified. No acute large vascular territory infarcts. No abnormal extra-axial fluid collections. Basal cisterns are not effaced and midline. VASCULAR:  Moderate calcific atherosclerosis of the carotid siphons and both vertebral arteries. SKULL: No skull fracture. No significant scalp soft tissue swelling. SINUSES/ORBITS: The mastoid air-cells are clear. The included paranasal sinuses are well-aerated.The included ocular globes and orbital contents are non-suspicious. OTHER: None. IMPRESSION: Mild superficial atrophy with chronic small vessel ischemic disease periventricular white matter. No acute intracranial abnormality noted. Electronically Signed   By: Tollie Eth M.D.   On: 07/15/2016 20:07     CBC  Recent Labs Lab 07/15/16 1627 07/16/16 0317 07/17/16 0328  WBC 4.3 6.6 8.7  HGB 13.0 13.2 12.3  HCT 38.9 41.3 37.8  PLT 80* 61* 69*  MCV 91.5 95.6 96.2  MCH 30.6 30.6 31.3  MCHC 33.4 32.0 32.5  RDW 14.2 14.3 14.4  LYMPHSABS 0.6*  --   --   MONOABS 0.5  --   --   EOSABS 0.0  --   --   BASOSABS 0.0  --   --     Chemistries   Recent Labs Lab 07/15/16 1627 07/15/16 2343 07/16/16 0317 07/17/16 0328  NA 136  --  137 140  K 3.8  --  3.4* 3.5  CL 96*  --  100* 105  CO2 31  --  24 25  GLUCOSE 128*  --  111* 75  BUN 16  --  15 17  CREATININE 1.06*  --  0.99 0.82  CALCIUM 8.8*  --  8.1* 8.0*  MG  --  1.9  --   --   AST 48*  --   --   --   ALT 21  --   --   --   ALKPHOS 89  --   --   --   BILITOT 1.0  --   --   --    ------------------------------------------------------------------------------------------------------------------ No results for input(s): CHOL, HDL, LDLCALC, TRIG, CHOLHDL, LDLDIRECT in the last 72 hours.  Lab Results  Component Value Date   HGBA1C 6.0 (H) 01/23/2015   ------------------------------------------------------------------------------------------------------------------  Recent Labs  07/15/16 2307  TSH 0.834    ------------------------------------------------------------------------------------------------------------------ No results for input(s): VITAMINB12, FOLATE, FERRITIN, TIBC, IRON, RETICCTPCT in the last 72 hours.  Coagulation profile No results for input(s): INR, PROTIME in the last 168 hours.  No results for input(s): DDIMER in the last 72 hours.  Cardiac Enzymes  Recent Labs Lab 07/15/16 1627 07/15/16 2343 07/16/16 0317  TROPONINI 0.03* 0.04* 0.05*   ------------------------------------------------------------------------------------------------------------------    Component Value Date/Time   BNP 172.1 (H) 04/19/2015 1624     Reynaldo Rossman M.D on 07/17/2016 at 1:53 PM  Between 7am to 7pm - Pager - [phone]  After 7pm go to [email or web address] - password [email or web address]  [facility] -  Office  [phone]  Dragon dictation system was used to create this note, attempts have been made to correct errors, however presence of uncorrected errors is not a reflection quality of care provided

## 2016-07-18 LAB — BASIC METABOLIC PANEL WITH GFR
Anion gap: 5 (ref 5–15)
BUN: 20 mg/dL (ref 6–20)
CO2: 28 mmol/L (ref 22–32)
Calcium: 8.1 mg/dL — ABNORMAL LOW (ref 8.9–10.3)
Chloride: 104 mmol/L (ref 101–111)
Creatinine, Ser: 0.73 mg/dL (ref 0.44–1.00)
GFR calc Af Amer: >60 mL/min
GFR calc non Af Amer: >60 mL/min
Glucose, Bld: 103 mg/dL — ABNORMAL HIGH (ref 65–99)
Potassium: 3.9 mmol/L (ref 3.5–5.1)
Sodium: 137 mmol/L (ref 135–145)

## 2016-07-18 LAB — LEGIONELLA PNEUMOPHILA SEROGP 1 UR AG: L. PNEUMOPHILA SEROGP 1 UR AG: NEGATIVE

## 2016-07-18 MED ORDER — MIRTAZAPINE 15 MG PO TABS
15.0000 mg | ORAL_TABLET | Freq: Every day | ORAL | Status: DC
  Administered 2016-07-18 – 2016-07-20 (×3): 15 mg via ORAL
  Filled 2016-07-18 (×3): qty 1

## 2016-07-18 NOTE — Clinical Social Work Note (Signed)
Clinical Social Work Assessment  Patient Details  Name: Jasmine [name] MRN: [id number] Date of Birth: [birth year]  Date of referral:  07/18/16               Reason for consult:  Facility Placement                Permission sought to share information with:  Chartered certified accountant granted to share information::  Yes, Verbal Permission Granted  Name::        Agency::     Relationship::     Contact Information:     Housing/Transportation Living arrangements for the past 2 months:  Single Family Home Source of Information:  Patient, Adult Children Jasmine [name]) Patient Interpreter Needed:  None Criminal Activity/Legal Involvement Pertinent to Current Situation/Hospitalization:    Significant Relationships:  Adult Children, Other Family Members (Daughter and grand daughter) Lives with:  Adult Children Do you feel safe going back to the place where you live?  Yes Need for family participation in patient care:  Yes (Comment)  Care giving concerns:  No concerns at this time.    Social Worker assessment / plan:  CSW met with patient and granddaughter, Jasmine [name], via bedside to discuss discharge plans. Patient stated she was very tired and granddaughter referred me to contact daughter, Jasmine [name], who is HCPOA. CSW contacted Jasmine [name] and informed her of PT's recommendation. Daughter stated short-term physical therapy would be a good option. Patient currently lives with daughter and they live in [place]. Daughter would prefer [facility] due to closeness of facility. Daughter is agreeable to information being referred to [facility] in [facility] as well.   Plan: CSW will complete FL2 and search for SNF.   Employment status:  Retired Forensic scientist:  Medicare PT Recommendations:  [place] / Referral to community resources:  [place]  Patient/Family's Response to care:  Patient and family appreciated CSW.   Patient/Family's  Understanding of and Emotional Response to Diagnosis, Current Treatment, and Prognosis:  Family understands current treatment and prognosis.   Emotional Assessment Appearance:  Appears stated age Attitude/Demeanor/Rapport:    Affect (typically observed):  Calm, Appropriate, Accepting Orientation:  Oriented to Self, Oriented to Place, Oriented to Situation Alcohol / Substance use:    Psych involvement (Current and /or in the community):  No (Comment)  Discharge Needs  Concerns to be addressed:  No discharge needs identified Readmission within the last 30 days:  No Current discharge risk:  None Barriers to Discharge:  No Barriers Identified   Jasmine Anna, LCSW 07/18/2016, 10:35 AM

## 2016-07-18 NOTE — Progress Notes (Signed)
Patient Demographics:    Jasmine [name], is a 83 y.o. female, DOB - 1922/10/06, FAO:[id number]  Admit date - 07/15/2016   Admitting Physician Alberteen Sam, MD  Outpatient Primary MD for the patient is Nicki Reaper, NP  LOS - 3   Chief Complaint  Patient presents with  . Shortness of Breath  . Cough  . Headache        Subjective:    Jasmine [name] today has no fevers, no emesis,  No chest pain,  Cough is better   Assessment  & Plan :    Principal Problem:   Sepsis (HCC) Active Problems:   Hypothyroidism   CAD - CABG '97, cath OK 2005, Myoview low risk 12/12   HTN (hypertension)   Chronic atrial fibrillation (HCC)   Chronic diastolic CHF (congestive heart failure) (HCC)   Thrombocytopenia (HCC)   COPD with acute exacerbation (HCC)   Influenza   Acute respiratory failure with hypoxemia (HCC)   Protein-calorie malnutrition, severe   1)Sepsis -Secondary to right-sided strep pneumonia as well as influenza A, tachycardia, hypotension and tachypnea has improved,  continue Tamiflu, stopped azithromycin, continue Rocephin for strep pneumo, continue flutter valve ,  continue supplemental oxygen, excessive Lethargy has resolved (was most likely due to benzos), decrease Remeron to 15 mg qhs   2)Acute and Chronic Hypoxic Respiratory Failure- secondary to #1 above in a patient with underlying diastolic CHF, improving, prior to admission patient was using oxygen only at bedtime, she is using oxygen continuously now  3)Chronic atrial fibrillation - continue Cardizem 120 mg q hs  and bisoprolol 10 mg q am for rate control, not on anticoagulation due to concerns about increased risk of falling   4)CAD - prior CABG '97, cath OK 2005, Myoview low risk 03/31/16, patient's cardiologist is Dr. Allyson Sabal, no ACS type symptoms at this time  5)HFpEF/history of Chronic diastolic CHF (congestive heart failure)  -  no evidence of acute exacerbation at this time, last known EF 55-60% based on echocardiogram from October 2016, be judicious with IV fluids to avoid volume overload  6)ChronicThrombocytopenia -no evidence of bleeding , patient is not on anticoagulation  7)Anxiety/depression- patient had excessive lethargy due to benzos, be judicious with benzodiazepine and Seroquel, decrease Remeron to 15 mg qhs  8)Dispo- plan of care discussed with patient's daughter, who is patient's primary caregiver and lives with patient, she is requesting possible transfer to skilled nursing facility for rehabilitation prior to returning home. Physical therapy eval and social work consult requested  DVT prophylaxis:SCDs Code Status:DNR Family Communication: Disposition Plan: SNF Consultants:  Phy Therapy     Lab Results  Component Value Date   PLT 69 (L) 07/17/2016    Inpatient Medications  Scheduled Meds: . aspirin EC  81 mg Oral Q breakfast  . atorvastatin  80 mg Oral Daily  . bisoprolol  10 mg Oral q morning - 10a  . cefTRIAXone (ROCEPHIN)  IV  1 g Intravenous Q24H  . clonazePAM  0.5 mg Oral 2 times per day  . clonazePAM  1 mg Oral QHS  . diltiazem  120 mg Oral QHS  . feeding supplement (ENSURE ENLIVE)  237 mL Oral BID BM  . levothyroxine  88 mcg Oral QAC breakfast  .  mirtazapine  45 mg Oral QHS  . multivitamin with minerals  1 tablet Oral Daily  . oseltamivir  30 mg Oral BID  . predniSONE  40 mg Oral Q breakfast  . QUEtiapine  25 mg Oral QHS   Continuous Infusions: PRN Meds:.acetaminophen **OR** acetaminophen, dextromethorphan-guaiFENesin, levalbuterol, ondansetron **OR** ondansetron (ZOFRAN) IV    Anti-infectives    Start     Dose/Rate Route Frequency Ordered Stop   07/17/16 1000  oseltamivir (TAMIFLU) capsule 30 mg     30 mg Oral 2 times daily 07/17/16 0800 07/21/16 0959   07/16/16 1000  oseltamivir (TAMIFLU) capsule 30 mg  Status:  Discontinued     30 mg Oral 2 times  daily 07/16/16 0106 07/16/16 0106   07/16/16 0115  oseltamivir (TAMIFLU) capsule 30 mg  Status:  Discontinued     30 mg Oral Daily at bedtime 07/16/16 0106 07/17/16 0800   07/16/16 0100  oseltamivir (TAMIFLU) capsule 75 mg  Status:  Discontinued     75 mg Oral 2 times daily 07/16/16 0054 07/16/16 0106   07/15/16 2130  cefTRIAXone (ROCEPHIN) 1 g in dextrose 5 % 50 mL IVPB     1 g 100 mL/hr over 30 Minutes Intravenous Every 24 hours 07/15/16 2122 07/22/16 2129   07/15/16 2130  azithromycin (ZITHROMAX) 500 mg in dextrose 5 % 250 mL IVPB  Status:  Discontinued     500 mg 250 mL/hr over 60 Minutes Intravenous Every 24 hours 07/15/16 2122 07/17/16 0920        Objective:   Vitals:   07/18/16 0800 07/18/16 0840 07/18/16 1147 07/18/16 1200  BP: 124/62   (!) 151/97  Pulse: 65   64  Resp: 17   17  Temp:  97.2 F (36.2 C) 97.5 F (36.4 C)   TempSrc:  Axillary Oral   SpO2: 97%   98%  Weight:      Height:        Wt Readings from Last 3 Encounters:  07/18/16 50.6 kg (111 lb 8.8 oz)  05/22/16 46.9 kg (103 lb 8 oz)  01/29/16 47.6 kg (105 lb)     Intake/Output Summary (Last 24 hours) at 07/18/16 1438 Last data filed at 07/18/16 1000  Gross per 24 hour  Intake              670 ml  Output              675 ml  Net               -5 ml     Physical Exam  Gen:- more awake, resting HEENT:- [place].AT, No sclera icterus Neck-Supple Neck,No JVD,.  Lungs-  Diminished in bases, few scattered wheezes CV- S1, S2 normal Abd-  +ve B.Sounds, Abd Soft, No tenderness,    Extremity/Skin:- No  edema,  Neuro- Generalized weakness and debility, without new focal deficits    Data Review:   Micro Results Recent Results (from the past 240 hour(s))  Blood Culture (routine x 2)     Status: None (Preliminary result)   Collection Time: 07/15/16  7:49 PM  Result Value Ref Range Status   Specimen Description BLOOD LEFT ANTECUBITAL  Final   Special Requests IN PEDIATRIC BOTTLE 1CC  Final   Culture    Final    NO GROWTH 3 DAYS Performed at [facility], [address], [place], [place] [address]    Report Status PENDING  Incomplete  Blood Culture (routine x 2)  Status: None (Preliminary result)   Collection Time: 07/15/16  7:49 PM  Result Value Ref Range Status   Specimen Description BLOOD LEFT ANTECUBITAL  Final   Special Requests IN PEDIATRIC BOTTLE 1CC  Final   Culture   Final    NO GROWTH 3 DAYS Performed at [facility], [address], [place], [place] [address]    Report Status PENDING  Incomplete  Culture, Urine     Status: Abnormal   Collection Time: 07/15/16  9:25 PM  Result Value Ref Range Status   Specimen Description URINE, CLEAN CATCH  Final   Special Requests NONE  Final   Culture MULTIPLE SPECIES PRESENT, SUGGEST RECOLLECTION (A)  Final   Report Status 07/17/2016 FINAL  Final  MRSA PCR Screening     Status: None   Collection Time: 07/16/16  1:48 AM  Result Value Ref Range Status   MRSA by PCR NEGATIVE NEGATIVE Final    Comment:        The GeneXpert MRSA Assay (FDA approved for NASAL specimens only), is one component of a comprehensive MRSA colonization surveillance program. It is not intended to diagnose MRSA infection nor to guide or monitor treatment for MRSA infections.     Radiology Reports Dg Chest 2 View  Result Date: 07/15/2016 CLINICAL DATA:  Cough for 3-4 days, headache for 2-3 days, atrial fibrillation, coronary artery disease, hypertension, GERD, former smoker EXAM: CHEST  2 VIEW COMPARISON:  09/09/2015 FINDINGS: Normal heart size post CABG. Calcified tortuous aorta. Pulmonary vascularity normal. Bronchitic changes with chronic accentuation of RIGHT upper lobe markings, little changed. Persistent blunting of the LEFT lateral costophrenic angle and mild LEFT basilar atelectasis, with associated small LEFT pleural effusion. Minimal RIGHT basilar atelectasis. No definite acute infiltrate or pneumothorax. Bones diffusely  demineralized. IMPRESSION: Post CABG. Aortic atherosclerosis. LEFT pleural effusion with bibasilar atelectasis greater on LEFT. Underlying emphysematous changes with chronic increase in RIGHT upper lobe markings, may represent scarring, unable to exclude recurrent infiltrate. Electronically Signed   By: Ulyses Southward M.D.   On: 07/15/2016 17:48   Ct Head Wo Contrast  Result Date: 07/16/2016 CLINICAL DATA:  Altered mental status, lethargy, hypertension, coronary artery disease, atrial fibrillation EXAM: CT HEAD WITHOUT CONTRAST TECHNIQUE: Contiguous axial images were obtained from the base of the skull through the vertex without intravenous contrast. COMPARISON:  07/15/2016 FINDINGS: Brain: Generalized atrophy. Normal ventricular morphology. No midline shift or mass effect. Question new lacunar infarct at the LEFT external capsule. Question additional lacunar infarct at LEFT thalamus. Small vessel chronic ischemic changes of deep cerebral white matter. No acute cortical infarction identified. No intracranial hemorrhage, mass lesion, or extra-axial fluid collection. Vascular: Atherosclerotic calcification of the internal carotid and vertebral arteries at the skullbase. Skull: Diffuse osseous demineralization Sinuses/Orbits: Cleared Other: N/A IMPRESSION: Atrophy with small vessel chronic ischemic changes of deep cerebral white matter. Questionable lacunar infarcts at the LEFT thalamus and LEFT external capsule. Electronically Signed   By: Ulyses Southward M.D.   On: 07/16/2016 17:16   Ct Head Wo Contrast  Result Date: 07/15/2016 CLINICAL DATA:  Cough and headache x3 days. EXAM: CT HEAD WITHOUT CONTRAST TECHNIQUE: Contiguous axial images were obtained from the base of the skull through the vertex without intravenous contrast. COMPARISON:  01/22/2015 CT FINDINGS: BRAIN: There is sulcal prominence consistent with superficial atrophy similar to prior exam. No intraparenchymal hemorrhage, mass effect nor midline shift.  Periventricular and subcortical white matter hypodensities consistent with chronic mild small vessel ischemic disease are identified.  No acute large vascular territory infarcts. No abnormal extra-axial fluid collections. Basal cisterns are not effaced and midline. VASCULAR: Moderate calcific atherosclerosis of the carotid siphons and both vertebral arteries. SKULL: No skull fracture. No significant scalp soft tissue swelling. SINUSES/ORBITS: The mastoid air-cells are clear. The included paranasal sinuses are well-aerated.The included ocular globes and orbital contents are non-suspicious. OTHER: None. IMPRESSION: Mild superficial atrophy with chronic small vessel ischemic disease periventricular white matter. No acute intracranial abnormality noted. Electronically Signed   By: Tollie Eth M.D.   On: 07/15/2016 20:07    CBC  Recent Labs Lab 07/15/16 1627 07/16/16 0317 07/17/16 0328  WBC 4.3 6.6 8.7  HGB 13.0 13.2 12.3  HCT 38.9 41.3 37.8  PLT 80* 61* 69*  MCV 91.5 95.6 96.2  MCH 30.6 30.6 31.3  MCHC 33.4 32.0 32.5  RDW 14.2 14.3 14.4  LYMPHSABS 0.6*  --   --   MONOABS 0.5  --   --   EOSABS 0.0  --   --   BASOSABS 0.0  --   --     Chemistries   Recent Labs Lab 07/15/16 1627 07/15/16 2343 07/16/16 0317 07/17/16 0328 07/18/16 0333  NA 136  --  137 140 137  K 3.8  --  3.4* 3.5 3.9  CL 96*  --  100* 105 104  CO2 31  --  GLUCOSE 128*  --  111* 75 103*  BUN 16  --  CREATININE 1.06*  --  0.99 0.82 0.73  CALCIUM 8.8*  --  8.1* 8.0* 8.1*  MG  --  1.9  --   --   --   AST 48*  --   --   --   --   ALT 21  --   --   --   --   ALKPHOS 89  --   --   --   --   BILITOT 1.0  --   --   --   --    ------------------------------------------------------------------------------------------------------------------ No results for input(s): CHOL, HDL, LDLCALC, TRIG, CHOLHDL, LDLDIRECT in the last 72 hours.  Lab Results  Component Value Date   HGBA1C 6.0 (H) 01/23/2015    ------------------------------------------------------------------------------------------------------------------  Recent Labs  07/15/16 2307  TSH 0.834   ------------------------------------------------------------------------------------------------------------------ No results for input(s): VITAMINB12, FOLATE, FERRITIN, TIBC, IRON, RETICCTPCT in the last 72 hours.  Coagulation profile No results for input(s): INR, PROTIME in the last 168 hours.  No results for input(s): DDIMER in the last 72 hours.  Cardiac Enzymes  Recent Labs Lab 07/15/16 1627 07/15/16 2343 07/16/16 0317  TROPONINI 0.03* 0.04* 0.05*   ------------------------------------------------------------------------------------------------------------------    Component Value Date/Time   BNP 172.1 (H) 04/19/2015 1624    Anees Vanecek M.D on 07/18/2016 at 2:38 PM  Between 7am to 7pm - Pager - [phone]  After 7pm go to [email or web address] - password [email or web address]  [facility] -  Office  [phone]  Dragon dictation system was used to create this note, attempts have been made to correct errors, however presence of uncorrected errors is not a reflection quality of care provided

## 2016-07-18 NOTE — [place] FL2 (Signed)
[place] MEDICAID FL2 LEVEL OF CARE SCREENING TOOL     IDENTIFICATION  Patient Name: Jasmine [name] Birthdate: 09-13-1922 Sex: female Admission Date (Current Location): 07/15/2016  [facility] and [place] Number:  Producer, television/film/video and Address:  [facility],  [address] [place] [address]      Provider Number: [id number]  Attending Physician Name and Address:  Shon Hale, MD  Relative Name and Phone Number:       Current Level of Care: Hospital Recommended Level of Care: Skilled Nursing Facility Prior Approval Number:    Date Approved/Denied:   PASRR Number:   [id number]   Discharge Plan: SNF    Current Diagnoses: Patient Active Problem List   Diagnosis Date Noted  . Influenza 07/16/2016  . Acute respiratory failure with hypoxemia (HCC) 07/16/2016  . Protein-calorie malnutrition, severe 07/16/2016  . Sepsis (HCC) 07/15/2016  . DVT (deep venous thrombosis) (HCC) 07/15/2016  . COPD with acute exacerbation (HCC) 07/15/2016  . Constipation 11/20/2015  . Thoracic back pain 11/20/2015  . Thrombocytopenia (HCC) 01/23/2015  . Chronic diastolic CHF (congestive heart failure) (HCC) 01/22/2015  . Carotid artery disease (HCC) 03/22/2014  . CAD - CABG '97, cath OK 2005, Myoview low risk 12/12 01/06/2013  . HTN (hypertension) 01/06/2013  . Chronic atrial fibrillation (HCC) 01/06/2013  . BARRETT'S ESOPHAGUS 06/13/2008  . INSOMNIA, CHRONIC 01/17/2007  . Hypothyroidism 01/12/2007  . HLD (hyperlipidemia) 01/12/2007  . Anxiety state 01/12/2007    Orientation RESPIRATION BLADDER Height & Weight     Self, Situation, Place  O2 (2L) Continent Weight: 111 lb 8.8 oz (50.6 kg) Height:   (165.1 cm)  BEHAVIORAL SYMPTOMS/MOOD NEUROLOGICAL BOWEL NUTRITION STATUS      Continent Diet (Heart healthy)  AMBULATORY STATUS COMMUNICATION OF NEEDS Skin   Limited Assist Verbally Normal                       Personal Care Assistance Level of Assistance   Bathing, Feeding, Dressing Bathing Assistance: Limited assistance Feeding assistance: Independent Dressing Assistance: Limited assistance     Functional Limitations Info             SPECIAL CARE FACTORS FREQUENCY  PT (By licensed PT), OT (By licensed OT)     PT Frequency: 5 OT Frequency: 5            Contractures      Additional Factors Info  Allergies, Code Status Code Status Info: DNR Allergies Info: CODEINE, MEPERIDINE HCL, OXYCODONE-ACETAMINOPHEN            Current Medications (07/18/2016):  This is the current hospital active medication list Current Facility-Administered Medications  Medication Dose Route Frequency Provider Last Rate Last Dose  . acetaminophen (TYLENOL) tablet 650 mg  650 mg Oral Q6H PRN Alberteen Sam, MD       Or  . acetaminophen (TYLENOL) suppository 650 mg  650 mg Rectal Q6H PRN Alberteen Sam, MD      . aspirin EC tablet 81 mg  81 mg Oral Q breakfast Alberteen Sam, MD   81 mg at 07/18/16 5621  . atorvastatin (LIPITOR) tablet 80 mg  80 mg Oral Daily Alberteen Sam, MD   80 mg at 07/18/16 1057  . bisoprolol (ZEBETA) tablet 10 mg  10 mg Oral q morning - 10a Calvert Cantor, MD   10 mg at 07/18/16 1057  . cefTRIAXone (ROCEPHIN) 1 g in dextrose 5 % 50 mL IVPB  1  g Intravenous Q24H Alberteen Sam, MD 100 mL/hr at 07/17/16 2150 1 g at 07/17/16 2150  . clonazePAM (KLONOPIN) tablet 0.5 mg  0.5 mg Oral 2 times per day Calvert Cantor, MD   0.5 mg at 07/16/16 0918  . clonazePAM (KLONOPIN) tablet 1 mg  1 mg Oral QHS Calvert Cantor, MD   1 mg at 07/17/16 2151  . dextromethorphan-guaiFENesin (MUCINEX DM) 30-600 MG per 12 hr tablet 1 tablet  1 tablet Oral BID PRN Alberteen Sam, MD   1 tablet at 07/16/16 0917  . diltiazem (CARDIZEM CD) 24 hr capsule 120 mg  120 mg Oral QHS Alberteen Sam, MD   120 mg at 07/17/16 2151  . feeding supplement (ENSURE ENLIVE) (ENSURE ENLIVE) liquid 237 mL  237 mL Oral BID BM Saima  Rizwan, MD   237 mL at 07/17/16 1400  . levalbuterol (XOPENEX) nebulizer solution 0.63 mg  0.63 mg Nebulization Q6H PRN Leda Gauze, NP      . levothyroxine (SYNTHROID, LEVOTHROID) tablet 88 mcg  88 mcg Oral QAC breakfast Alberteen Sam, MD   88 mcg at 07/18/16 [phone]  . mirtazapine (REMERON) tablet 45 mg  45 mg Oral QHS Alberteen Sam, MD   45 mg at 07/17/16 2151  . multivitamin with minerals tablet 1 tablet  1 tablet Oral Daily Calvert Cantor, MD   1 tablet at 07/18/16 1057  . ondansetron (ZOFRAN) tablet 4 mg  4 mg Oral Q6H PRN Alberteen Sam, MD       Or  . ondansetron (ZOFRAN) injection 4 mg  4 mg Intravenous Q6H PRN Alberteen Sam, MD      . oseltamivir (TAMIFLU) capsule 30 mg  30 mg Oral BID Otho Bellows, RPH   30 mg at 07/18/16 1057  . predniSONE (DELTASONE) tablet 40 mg  40 mg Oral Q breakfast Alberteen Sam, MD   40 mg at 07/18/16 9604  . QUEtiapine (SEROQUEL) tablet 25 mg  25 mg Oral QHS Calvert Cantor, MD   25 mg at 07/17/16 2151     Discharge Medications: Please see discharge summary for a list of discharge medications.  Relevant Imaging Results:  Relevant Lab Results:   Additional Information SS#: [id number]  Donnie Coffin, LCSW

## 2016-07-18 NOTE — Progress Notes (Signed)
Patient transferred from ICU. Patient denies pain and has no complaints. Telemetry applied. Vital signs stable. Oriented patient to room and equipment. No changes from previous assessment. Will continue to monitor.

## 2016-07-19 LAB — PROCALCITONIN: Procalcitonin: <0.1 ng/mL

## 2016-07-19 NOTE — Progress Notes (Signed)
BP 182/95. Patient asymptomatic. NP on call notified. New order placed.

## 2016-07-19 NOTE — Progress Notes (Signed)
Patient Demographics:    Jasmine [name], is a 83 y.o. female, DOB - Aug 19, 1922, ZOX:[id number]  Admit date - 07/15/2016   Admitting Physician Alberteen Sam, MD  Outpatient Primary MD for the patient is Nicki Reaper, NP  LOS - 4   Chief Complaint  Patient presents with  . Shortness of Breath  . Cough  . Headache        Subjective:    Jasmine [name] today has no fevers, no emesis,  No chest pain,  resting   Assessment  & Plan :    Principal Problem:   Sepsis (HCC) Active Problems:   Hypothyroidism   CAD - CABG '97, cath OK 2005, Myoview low risk 12/12   HTN (hypertension)   Chronic atrial fibrillation (HCC)   Chronic diastolic CHF (congestive heart failure) (HCC)   Thrombocytopenia (HCC)   COPD with acute exacerbation (HCC)   Influenza   Acute respiratory failure with hypoxemia (HCC)   Protein-calorie malnutrition, severe   1)Sepsis -Secondary to right-sided strep pneumonia as well as influenza A, Overall much improved, tachycardia,hypotension and tachypnea has improved,continue Tamiflu, stopped azithromycin, continue Rocephin for strep pneumo, continue flutter valve , continue supplemental oxygen, excessive Lethargyhas resolved (was most likely due to benzos),   2)Acute and Chronic Hypoxic Respiratory Failure- secondary to #1 above in a patient with underlying diastolic CHF, improving, overall improving, prior to admission patient was using oxygen only at bedtime, she is using oxygen continuously now  3)Chronic atrial fibrillation - continue Cardizem 120 mg q hs  and bisoprolol 10 mg q am for rate control, not on anticoagulation due to concerns about increased risk of falling  4)CAD - priorCABG '97, cath OK 2005, Myoview low risk 03/31/16, patient's cardiologist is Dr. Allyson Sabal, no ACS type symptoms at this time  5)HFpEF/history of Chronic diastolic CHF (congestive heart  failure) - no evidence of acute exacerbation at this time, last known EF 55-60% based on echocardiogram from October 2016, be judicious with IV fluids to avoid volume overload  6)ChronicThrombocytopenia -no evidence of bleeding , patient is not on anticoagulation  7)Anxiety/depression-patient had excessive lethargy due to benzos, be judicious with benzodiazepine and Seroquel, continue Remeron at 15 mg qhs  8)Dispo- plan of care discussed with patient's daughter, who is patient's primary caregiver and lives with patient, she is requesting possible transfer to skilled nursing facility for rehabilitation prior to returning home. Physical therapy eval and social work consult requested. Awaiting placement to skilled nursing facility  DVT prophylaxis:SCDs Code Status:DNR Family Communication: Disposition Plan:SNF Consultants:  Phy Therapy   Lab Results  Component Value Date   PLT 69 (L) 07/17/2016    Inpatient Medications  Scheduled Meds: . aspirin EC  81 mg Oral Q breakfast  . atorvastatin  80 mg Oral Daily  . bisoprolol  10 mg Oral q morning - 10a  . cefTRIAXone (ROCEPHIN)  IV  1 g Intravenous Q24H  . clonazePAM  0.5 mg Oral 2 times per day  . clonazePAM  1 mg Oral QHS  . diltiazem  120 mg Oral QHS  . feeding supplement (ENSURE ENLIVE)  237 mL Oral BID BM  . levothyroxine  88 mcg Oral QAC breakfast  . mirtazapine  15 mg Oral QHS  . multivitamin  with minerals  1 tablet Oral Daily  . oseltamivir  30 mg Oral BID  . predniSONE  40 mg Oral Q breakfast  . QUEtiapine  25 mg Oral QHS   Continuous Infusions: PRN Meds:.acetaminophen **OR** acetaminophen, dextromethorphan-guaiFENesin, levalbuterol, ondansetron **OR** ondansetron (ZOFRAN) IV    Anti-infectives    Start     Dose/Rate Route Frequency Ordered Stop   07/17/16 1000  oseltamivir (TAMIFLU) capsule 30 mg     30 mg Oral 2 times daily 07/17/16 0800 07/21/16 0959   07/16/16 1000  oseltamivir (TAMIFLU) capsule  30 mg  Status:  Discontinued     30 mg Oral 2 times daily 07/16/16 0106 07/16/16 0106   07/16/16 0115  oseltamivir (TAMIFLU) capsule 30 mg  Status:  Discontinued     30 mg Oral Daily at bedtime 07/16/16 0106 07/17/16 0800   07/16/16 0100  oseltamivir (TAMIFLU) capsule 75 mg  Status:  Discontinued     75 mg Oral 2 times daily 07/16/16 0054 07/16/16 0106   07/15/16 2130  cefTRIAXone (ROCEPHIN) 1 g in dextrose 5 % 50 mL IVPB     1 g 100 mL/hr over 30 Minutes Intravenous Every 24 hours 07/15/16 2122 07/22/16 2129   07/15/16 2130  azithromycin (ZITHROMAX) 500 mg in dextrose 5 % 250 mL IVPB  Status:  Discontinued     500 mg 250 mL/hr over 60 Minutes Intravenous Every 24 hours 07/15/16 2122 07/17/16 0920        Objective:   Vitals:   07/18/16 1700 07/18/16 2040 07/19/16 0526 07/19/16 1406  BP: (!) 159/82 (!) 153/70 131/63 (!) 158/77  Pulse: 99 64 66 77  Resp: (!) Temp: 97.9 F (36.6 C) 98.3 F (36.8 C) 97.6 F (36.4 C) 98.3 F (36.8 C)  TempSrc: Oral Oral Oral Oral  SpO2: 99% 99% 98% 98%  Weight: 53.3 kg (117 lb 6.4 oz)  53.6 kg (118 lb 2.7 oz)   Height:        Wt Readings from Last 3 Encounters:  07/19/16 53.6 kg (118 lb 2.7 oz)  05/22/16 46.9 kg (103 lb 8 oz)  01/29/16 47.6 kg (105 lb)     Intake/Output Summary (Last 24 hours) at 07/19/16 1840 Last data filed at 07/19/16 1642  Gross per 24 hour  Intake              360 ml  Output              825 ml  Net             -465 ml     Physical Exam  Gen:- Awake Alert,  In no apparent distress  HEENT:- [place].AT, No sclera icterus Neck-Supple Neck,No JVD,.  Lungs-  improved air movement bilaterally, no wheezing CV- S1, S2 normal Abd-  +ve B.Sounds, Abd Soft, No tenderness,    Extremity/Skin:- Neg homan's    Data Review:   Micro Results Recent Results (from the past 240 hour(s))  Blood Culture (routine x 2)     Status: None (Preliminary result)   Collection Time: 07/15/16  7:49 PM  Result Value Ref Range  Status   Specimen Description BLOOD LEFT ANTECUBITAL  Final   Special Requests IN PEDIATRIC BOTTLE 1CC  Final   Culture   Final    NO GROWTH 4 DAYS Performed at [facility], [address] [place], [place] [address]    Report Status PENDING  Incomplete  Blood Culture (routine x  2)     Status: None (Preliminary result)   Collection Time: 07/15/16  7:49 PM  Result Value Ref Range Status   Specimen Description BLOOD LEFT ANTECUBITAL  Final   Special Requests IN PEDIATRIC BOTTLE 1CC  Final   Culture   Final    NO GROWTH 4 DAYS Performed at [facility], [address], [place], [place] [address]    Report Status PENDING  Incomplete  Culture, Urine     Status: Abnormal   Collection Time: 07/15/16  9:25 PM  Result Value Ref Range Status   Specimen Description URINE, CLEAN CATCH  Final   Special Requests NONE  Final   Culture MULTIPLE SPECIES PRESENT, SUGGEST RECOLLECTION (A)  Final   Report Status 07/17/2016 FINAL  Final  MRSA PCR Screening     Status: None   Collection Time: 07/16/16  1:48 AM  Result Value Ref Range Status   MRSA by PCR NEGATIVE NEGATIVE Final    Comment:        The GeneXpert MRSA Assay (FDA approved for NASAL specimens only), is one component of a comprehensive MRSA colonization surveillance program. It is not intended to diagnose MRSA infection nor to guide or monitor treatment for MRSA infections.     Radiology Reports Dg Chest 2 View  Result Date: 07/15/2016 CLINICAL DATA:  Cough for 3-4 days, headache for 2-3 days, atrial fibrillation, coronary artery disease, hypertension, GERD, former smoker EXAM: CHEST  2 VIEW COMPARISON:  09/09/2015 FINDINGS: Normal heart size post CABG. Calcified tortuous aorta. Pulmonary vascularity normal. Bronchitic changes with chronic accentuation of RIGHT upper lobe markings, little changed. Persistent blunting of the LEFT lateral costophrenic angle and mild LEFT basilar atelectasis, with associated small LEFT  pleural effusion. Minimal RIGHT basilar atelectasis. No definite acute infiltrate or pneumothorax. Bones diffusely demineralized. IMPRESSION: Post CABG. Aortic atherosclerosis. LEFT pleural effusion with bibasilar atelectasis greater on LEFT. Underlying emphysematous changes with chronic increase in RIGHT upper lobe markings, may represent scarring, unable to exclude recurrent infiltrate. Electronically Signed   By: Ulyses Southward M.D.   On: 07/15/2016 17:48   Ct Head Wo Contrast  Result Date: 07/16/2016 CLINICAL DATA:  Altered mental status, lethargy, hypertension, coronary artery disease, atrial fibrillation EXAM: CT HEAD WITHOUT CONTRAST TECHNIQUE: Contiguous axial images were obtained from the base of the skull through the vertex without intravenous contrast. COMPARISON:  07/15/2016 FINDINGS: Brain: Generalized atrophy. Normal ventricular morphology. No midline shift or mass effect. Question new lacunar infarct at the LEFT external capsule. Question additional lacunar infarct at LEFT thalamus. Small vessel chronic ischemic changes of deep cerebral white matter. No acute cortical infarction identified. No intracranial hemorrhage, mass lesion, or extra-axial fluid collection. Vascular: Atherosclerotic calcification of the internal carotid and vertebral arteries at the skullbase. Skull: Diffuse osseous demineralization Sinuses/Orbits: Cleared Other: N/A IMPRESSION: Atrophy with small vessel chronic ischemic changes of deep cerebral white matter. Questionable lacunar infarcts at the LEFT thalamus and LEFT external capsule. Electronically Signed   By: Ulyses Southward M.D.   On: 07/16/2016 17:16   Ct Head Wo Contrast  Result Date: 07/15/2016 CLINICAL DATA:  Cough and headache x3 days. EXAM: CT HEAD WITHOUT CONTRAST TECHNIQUE: Contiguous axial images were obtained from the base of the skull through the vertex without intravenous contrast. COMPARISON:  01/22/2015 CT FINDINGS: BRAIN: There is sulcal prominence  consistent with superficial atrophy similar to prior exam. No intraparenchymal hemorrhage, mass effect nor midline shift. Periventricular and subcortical white matter hypodensities consistent with chronic mild small  vessel ischemic disease are identified. No acute large vascular territory infarcts. No abnormal extra-axial fluid collections. Basal cisterns are not effaced and midline. VASCULAR: Moderate calcific atherosclerosis of the carotid siphons and both vertebral arteries. SKULL: No skull fracture. No significant scalp soft tissue swelling. SINUSES/ORBITS: The mastoid air-cells are clear. The included paranasal sinuses are well-aerated.The included ocular globes and orbital contents are non-suspicious. OTHER: None. IMPRESSION: Mild superficial atrophy with chronic small vessel ischemic disease periventricular white matter. No acute intracranial abnormality noted. Electronically Signed   By: Tollie Eth M.D.   On: 07/15/2016 20:07     CBC  Recent Labs Lab 07/15/16 1627 07/16/16 0317 07/17/16 0328  WBC 4.3 6.6 8.7  HGB 13.0 13.2 12.3  HCT 38.9 41.3 37.8  PLT 80* 61* 69*  MCV 91.5 95.6 96.2  MCH 30.6 30.6 31.3  MCHC 33.4 32.0 32.5  RDW 14.2 14.3 14.4  LYMPHSABS 0.6*  --   --   MONOABS 0.5  --   --   EOSABS 0.0  --   --   BASOSABS 0.0  --   --     Chemistries   Recent Labs Lab 07/15/16 1627 07/15/16 2343 07/16/16 0317 07/17/16 0328 07/18/16 0333  NA 136  --  137 140 137  K 3.8  --  3.4* 3.5 3.9  CL 96*  --  100* 105 104  CO2 31  --  GLUCOSE 128*  --  111* 75 103*  BUN 16  --  CREATININE 1.06*  --  0.99 0.82 0.73  CALCIUM 8.8*  --  8.1* 8.0* 8.1*  MG  --  1.9  --   --   --   AST 48*  --   --   --   --   ALT 21  --   --   --   --   ALKPHOS 89  --   --   --   --   BILITOT 1.0  --   --   --   --    ------------------------------------------------------------------------------------------------------------------ No results for input(s): CHOL, HDL,  LDLCALC, TRIG, CHOLHDL, LDLDIRECT in the last 72 hours.  Lab Results  Component Value Date   HGBA1C 6.0 (H) 01/23/2015   ------------------------------------------------------------------------------------------------------------------ No results for input(s): TSH, T4TOTAL, T3FREE, THYROIDAB in the last 72 hours.  Invalid input(s): FREET3 ------------------------------------------------------------------------------------------------------------------ No results for input(s): VITAMINB12, FOLATE, FERRITIN, TIBC, IRON, RETICCTPCT in the last 72 hours.  Coagulation profile No results for input(s): INR, PROTIME in the last 168 hours.  No results for input(s): DDIMER in the last 72 hours.  Cardiac Enzymes  Recent Labs Lab 07/15/16 1627 07/15/16 2343 07/16/16 0317  TROPONINI 0.03* 0.04* 0.05*   ------------------------------------------------------------------------------------------------------------------    Component Value Date/Time   BNP 172.1 (H) 04/19/2015 1624     Quintavia Rogstad M.D on 07/19/2016 at 6:40 PM  Between 7am to 7pm - Pager - [phone]  After 7pm go to [email or web address] - password [email or web address]  [facility] -  Office  [phone]  Dragon dictation system was used to create this note, attempts have been made to correct errors, however presence of uncorrected errors is not a reflection quality of care provided

## 2016-07-20 LAB — CULTURE, BLOOD (ROUTINE X 2)
CULTURE: NO GROWTH
Culture: NO GROWTH

## 2016-07-20 MED ORDER — ISOSORBIDE MONONITRATE ER 30 MG PO TB24
30.0000 mg | ORAL_TABLET | Freq: Every day | ORAL | Status: DC
  Administered 2016-07-20 – 2016-07-21 (×2): 30 mg via ORAL
  Filled 2016-07-20 (×2): qty 1

## 2016-07-20 MED ORDER — HYDRALAZINE HCL 20 MG/ML IJ SOLN
10.0000 mg | Freq: Once | INTRAMUSCULAR | Status: DC
Start: 2016-07-20 — End: 2016-07-21

## 2016-07-20 MED ORDER — HYDRALAZINE HCL 20 MG/ML IJ SOLN
10.0000 mg | Freq: Once | INTRAMUSCULAR | Status: AC
  Administered 2016-07-20: 10 mg via INTRAVENOUS
  Filled 2016-07-20: qty 1

## 2016-07-20 NOTE — Progress Notes (Signed)
Patient Demographics:    Jasmine [name], is a 83 y.o. female, DOB - 07/09/1922, ZOX:[id number]  Admit date - 07/15/2016   Admitting Physician Alberteen Sam, MD  Outpatient Primary MD for the patient is Nicki Reaper, NP  LOS - 5   Chief Complaint  Patient presents with  . Shortness of Breath  . Cough  . Headache        Subjective:    Jasmine [name] today has no fevers, no emesis,  No chest pain,  resting   Assessment  & Plan :    Principal Problem:   Sepsis (HCC) Active Problems:   Hypothyroidism   CAD - CABG '97, cath OK 2005, Myoview low risk 12/12   HTN (hypertension)   Chronic atrial fibrillation (HCC)   Chronic diastolic CHF (congestive heart failure) (HCC)   Thrombocytopenia (HCC)   COPD with acute exacerbation (HCC)   Influenza   Acute respiratory failure with hypoxemia (HCC)   Protein-calorie malnutrition, severe  Brief summary:- Jasmine A Farlowis a 83 y.o.femalewith a past medical history significant for CAD s/p CABG, HFpEF on noct O2, Afib not on AC, hypothyroidism, and HTNwho presents with few days worsening cough and weakness. She was found to have sepsis secondary to strep pneumonia and influenza.  She is currently improved significantly and just awaiting placement to skilled nursing facility at this time   1)Sepsis -Secondary to right-sided strep pneumonia as well as influenza A, Overall much improved, complete Tamiflu, Continue Rocephin for strep pneumo, continue flutter valve , continue supplemental oxygen, excessive Lethargyhas resolved (was most likely due to benzos),   2)Acute and Chronic Hypoxic Respiratory Failure- secondary to #1 above in a patient with underlying diastolic CHF, improving, overall improving, prior to admission patient was using oxygen only at bedtime, she is using oxygen continuously now  3)Chronic atrial fibrillation - rate control  improved, continue Cardizem 120 mg q hs and bisoprolol 10 mg q am for rate control, not on anticoagulation due to concerns about increased risk of falling  4)CAD - priorCABG '97, cath OK 2005, Myoview low risk 03/31/16, patient's cardiologist is Dr. Allyson Sabal, no ACS type symptoms at this time  5)HFpEF/history of Chronic diastolic CHF (congestive heart failure) - no evidence of acute exacerbation at this time, last known EF 55-60% based on echocardiogram from October 2016, avoid aggressive IV fluids to avoid volume overload  6)ChronicThrombocytopenia -no evidence of bleeding , patient is not on anticoagulation, repeat CBC on 07/21/2016  7)Anxiety/depression-patient had excessive lethargy due to benzos, be judicious with benzodiazepine and Seroquel, continue Remeron at 15 mg qhs  8)Dispo- plan of care discussed with patient's daughter, who is patient's primary caregiver and lives with patient, she is requesting possible transfer to skilled nursing facility for rehabilitation prior to returning home. Physical therapy eval and social work consult requested. Awaiting placement to skilled nursing facility  DVT prophylaxis:SCDs Code Status:DNR Family Communication: Disposition Plan:SNF Consultants:  Phy Therapy and social worker   Lab Results  Component Value Date   PLT 69 (L) 07/17/2016    Inpatient Medications  Scheduled Meds: . aspirin EC  81 mg Oral Q breakfast  . atorvastatin  80 mg Oral Daily  . bisoprolol  10 mg Oral q morning - 10a  .  cefTRIAXone (ROCEPHIN)  IV  1 g Intravenous Q24H  . clonazePAM  0.5 mg Oral 2 times per day  . clonazePAM  1 mg Oral QHS  . diltiazem  120 mg Oral QHS  . feeding supplement (ENSURE ENLIVE)  237 mL Oral BID BM  . hydrALAZINE  10 mg Intravenous Once  . isosorbide mononitrate  30 mg Oral Daily  . levothyroxine  88 mcg Oral QAC breakfast  . mirtazapine  15 mg Oral QHS  . multivitamin with minerals  1 tablet Oral Daily  .  oseltamivir  30 mg Oral BID  . QUEtiapine  25 mg Oral QHS   Continuous Infusions: PRN Meds:.acetaminophen **OR** acetaminophen, dextromethorphan-guaiFENesin, levalbuterol, ondansetron **OR** ondansetron (ZOFRAN) IV    Anti-infectives    Start     Dose/Rate Route Frequency Ordered Stop   07/17/16 1000  oseltamivir (TAMIFLU) capsule 30 mg     30 mg Oral 2 times daily 07/17/16 0800 07/21/16 0959   07/16/16 1000  oseltamivir (TAMIFLU) capsule 30 mg  Status:  Discontinued     30 mg Oral 2 times daily 07/16/16 0106 07/16/16 0106   07/16/16 0115  oseltamivir (TAMIFLU) capsule 30 mg  Status:  Discontinued     30 mg Oral Daily at bedtime 07/16/16 0106 07/17/16 0800   07/16/16 0100  oseltamivir (TAMIFLU) capsule 75 mg  Status:  Discontinued     75 mg Oral 2 times daily 07/16/16 0054 07/16/16 0106   07/15/16 2130  cefTRIAXone (ROCEPHIN) 1 g in dextrose 5 % 50 mL IVPB     1 g 100 mL/hr over 30 Minutes Intravenous Every 24 hours 07/15/16 2122 07/22/16 2129   07/15/16 2130  azithromycin (ZITHROMAX) 500 mg in dextrose 5 % 250 mL IVPB  Status:  Discontinued     500 mg 250 mL/hr over 60 Minutes Intravenous Every 24 hours 07/15/16 2122 07/17/16 0920        Objective:   Vitals:   07/20/16 0110 07/20/16 0500 07/20/16 0525 07/20/16 1400  BP: (!) 155/81  132/67 (!) 119/58  Pulse:   72 87  Resp:   18 14  Temp: 98.1 F (36.7 C)  97.9 F (36.6 C) 97.7 F (36.5 C)  TempSrc:   Oral Oral  SpO2:   99% 94%  Weight:  53.9 kg (118 lb 13.3 oz)    Height:        Wt Readings from Last 3 Encounters:  07/20/16 53.9 kg (118 lb 13.3 oz)  05/22/16 46.9 kg (103 lb 8 oz)  01/29/16 47.6 kg (105 lb)     Intake/Output Summary (Last 24 hours) at 07/20/16 1853 Last data filed at 07/20/16 1400  Gross per 24 hour  Intake              290 ml  Output             1100 ml  Net             -810 ml     Physical Exam  Gen:- Awake Alert,  In no apparent distress  HEENT:- [place].AT, No sclera icterus Neck-Supple  Neck,No JVD,.  Lungs- improved air movement, no wheezing CV- S1, S2 normal, irregularly irregular Abd-  +ve B.Sounds, Abd Soft, No tenderness,    Extremity/Skin:-Negative Homans   Data Review:   Micro Results Recent Results (from the past 240 hour(s))  Blood Culture (routine x 2)     Status: None   Collection Time: 07/15/16  7:49 PM  Result Value  Ref Range Status   Specimen Description BLOOD LEFT ANTECUBITAL  Final   Special Requests IN PEDIATRIC BOTTLE 1CC  Final   Culture   Final    NO GROWTH 5 DAYS Performed at [facility], [address], [place], [place] [address]    Report Status 07/20/2016 FINAL  Final  Blood Culture (routine x 2)     Status: None   Collection Time: 07/15/16  7:49 PM  Result Value Ref Range Status   Specimen Description BLOOD LEFT ANTECUBITAL  Final   Special Requests IN PEDIATRIC BOTTLE 1CC  Final   Culture   Final    NO GROWTH 5 DAYS Performed at [facility], [address] [place], [place] [address]    Report Status 07/20/2016 FINAL  Final  Culture, Urine     Status: Abnormal   Collection Time: 07/15/16  9:25 PM  Result Value Ref Range Status   Specimen Description URINE, CLEAN CATCH  Final   Special Requests NONE  Final   Culture MULTIPLE SPECIES PRESENT, SUGGEST RECOLLECTION (A)  Final   Report Status 07/17/2016 FINAL  Final  MRSA PCR Screening     Status: None   Collection Time: 07/16/16  1:48 AM  Result Value Ref Range Status   MRSA by PCR NEGATIVE NEGATIVE Final    Comment:        The GeneXpert MRSA Assay (FDA approved for NASAL specimens only), is one component of a comprehensive MRSA colonization surveillance program. It is not intended to diagnose MRSA infection nor to guide or monitor treatment for MRSA infections.     Radiology Reports Dg Chest 2 View  Result Date: 07/15/2016 CLINICAL DATA:  Cough for 3-4 days, headache for 2-3 days, atrial fibrillation, coronary artery disease, hypertension, GERD, former  smoker EXAM: CHEST  2 VIEW COMPARISON:  09/09/2015 FINDINGS: Normal heart size post CABG. Calcified tortuous aorta. Pulmonary vascularity normal. Bronchitic changes with chronic accentuation of RIGHT upper lobe markings, little changed. Persistent blunting of the LEFT lateral costophrenic angle and mild LEFT basilar atelectasis, with associated small LEFT pleural effusion. Minimal RIGHT basilar atelectasis. No definite acute infiltrate or pneumothorax. Bones diffusely demineralized. IMPRESSION: Post CABG. Aortic atherosclerosis. LEFT pleural effusion with bibasilar atelectasis greater on LEFT. Underlying emphysematous changes with chronic increase in RIGHT upper lobe markings, may represent scarring, unable to exclude recurrent infiltrate. Electronically Signed   By: Ulyses Southward M.D.   On: 07/15/2016 17:48   Ct Head Wo Contrast  Result Date: 07/16/2016 CLINICAL DATA:  Altered mental status, lethargy, hypertension, coronary artery disease, atrial fibrillation EXAM: CT HEAD WITHOUT CONTRAST TECHNIQUE: Contiguous axial images were obtained from the base of the skull through the vertex without intravenous contrast. COMPARISON:  07/15/2016 FINDINGS: Brain: Generalized atrophy. Normal ventricular morphology. No midline shift or mass effect. Question new lacunar infarct at the LEFT external capsule. Question additional lacunar infarct at LEFT thalamus. Small vessel chronic ischemic changes of deep cerebral white matter. No acute cortical infarction identified. No intracranial hemorrhage, mass lesion, or extra-axial fluid collection. Vascular: Atherosclerotic calcification of the internal carotid and vertebral arteries at the skullbase. Skull: Diffuse osseous demineralization Sinuses/Orbits: Cleared Other: N/A IMPRESSION: Atrophy with small vessel chronic ischemic changes of deep cerebral white matter. Questionable lacunar infarcts at the LEFT thalamus and LEFT external capsule. Electronically Signed   By: Ulyses Southward  M.D.   On: 07/16/2016 17:16   Ct Head Wo Contrast  Result Date: 07/15/2016 CLINICAL DATA:  Cough and headache x3  days. EXAM: CT HEAD WITHOUT CONTRAST TECHNIQUE: Contiguous axial images were obtained from the base of the skull through the vertex without intravenous contrast. COMPARISON:  01/22/2015 CT FINDINGS: BRAIN: There is sulcal prominence consistent with superficial atrophy similar to prior exam. No intraparenchymal hemorrhage, mass effect nor midline shift. Periventricular and subcortical white matter hypodensities consistent with chronic mild small vessel ischemic disease are identified. No acute large vascular territory infarcts. No abnormal extra-axial fluid collections. Basal cisterns are not effaced and midline. VASCULAR: Moderate calcific atherosclerosis of the carotid siphons and both vertebral arteries. SKULL: No skull fracture. No significant scalp soft tissue swelling. SINUSES/ORBITS: The mastoid air-cells are clear. The included paranasal sinuses are well-aerated.The included ocular globes and orbital contents are non-suspicious. OTHER: None. IMPRESSION: Mild superficial atrophy with chronic small vessel ischemic disease periventricular white matter. No acute intracranial abnormality noted. Electronically Signed   By: Tollie Eth M.D.   On: 07/15/2016 20:07     CBC  Recent Labs Lab 07/15/16 1627 07/16/16 0317 07/17/16 0328  WBC 4.3 6.6 8.7  HGB 13.0 13.2 12.3  HCT 38.9 41.3 37.8  PLT 80* 61* 69*  MCV 91.5 95.6 96.2  MCH 30.6 30.6 31.3  MCHC 33.4 32.0 32.5  RDW 14.2 14.3 14.4  LYMPHSABS 0.6*  --   --   MONOABS 0.5  --   --   EOSABS 0.0  --   --   BASOSABS 0.0  --   --     Chemistries   Recent Labs Lab 07/15/16 1627 07/15/16 2343 07/16/16 0317 07/17/16 0328 07/18/16 0333  NA 136  --  137 140 137  K 3.8  --  3.4* 3.5 3.9  CL 96*  --  100* 105 104  CO2 31  --  GLUCOSE 128*  --  111* 75 103*  BUN 16  --  CREATININE 1.06*  --  0.99 0.82 0.73    CALCIUM 8.8*  --  8.1* 8.0* 8.1*  MG  --  1.9  --   --   --   AST 48*  --   --   --   --   ALT 21  --   --   --   --   ALKPHOS 89  --   --   --   --   BILITOT 1.0  --   --   --   --    ------------------------------------------------------------------------------------------------------------------ No results for input(s): CHOL, HDL, LDLCALC, TRIG, CHOLHDL, LDLDIRECT in the last 72 hours.  Lab Results  Component Value Date   HGBA1C 6.0 (H) 01/23/2015   ------------------------------------------------------------------------------------------------------------------ No results for input(s): TSH, T4TOTAL, T3FREE, THYROIDAB in the last 72 hours.  Invalid input(s): FREET3 ------------------------------------------------------------------------------------------------------------------ No results for input(s): VITAMINB12, FOLATE, FERRITIN, TIBC, IRON, RETICCTPCT in the last 72 hours.  Coagulation profile No results for input(s): INR, PROTIME in the last 168 hours.  No results for input(s): DDIMER in the last 72 hours.  Cardiac Enzymes  Recent Labs Lab 07/15/16 1627 07/15/16 2343 07/16/16 0317  TROPONINI 0.03* 0.04* 0.05*   ------------------------------------------------------------------------------------------------------------------    Component Value Date/Time   BNP 172.1 (H) 04/19/2015 1624     Noach Calvillo M.D on 07/20/2016 at 6:53 PM  Between 7am to 7pm - Pager - [phone]  After 7pm go to [email or web address] - password [facility]  [facility] -  Office  [phone]  Dragon dictation system was used to create this note, attempts have been made to correct errors,  however presence of uncorrected errors is not a reflection quality of care provided

## 2016-07-20 NOTE — Progress Notes (Signed)
qPhysical Therapy Treatment Patient Details Name: Jasmine [name] MRN: [id number] DOB: [birth year] Today's Date: 07/20/2016    History of Present Illness  Jasmine [name] is a 83 y.o. female with a past medical history significant for CAD s/p CABG, HFpEF on noct O2, Afib not on AC, hypothyroidism, and HTN who presents with few days worsening cough and weakness.  Chest xray: LEFT pleural effusion with bibasilar atelectasis greater on LEFT     PT Comments    The patient tolerated transfers OOB. Should be able to progress with ambulation next visit.plans for [facility] for rehab.  Follow Up Recommendations  SNF (daugther agrees )     Equipment Recommendations  None recommended by PT    Recommendations for Other Services       Precautions / Restrictions Precautions Precautions: Fall Precaution Comments: monitor sats, on O2 at  night Restrictions Weight Bearing Restrictions: No    Mobility  Bed Mobility Overal bed mobility: Needs Assistance Bed Mobility: Supine to Sit     Supine to sit: Mod assist     General bed mobility comments: assist  with trunk and legs , extra time tom mobilize  Transfers Overall transfer level: Needs assistance Equipment used: Rolling walker (2 wheeled) Transfers: Sit to/from [employer] transfer comment: assuist to rise from the bed, assist to take 4 steps to the recliner, assist with turning the RW. pt. on 2 liters O2, sats 96%  Ambulation/Gait                 Stairs            Wheelchair Mobility    Modified Rankin (Stroke Patients Only)       Balance                                            Cognition Arousal/Alertness: Awake/alert                                            Exercises      General Comments        Pertinent Vitals/Pain Pain Assessment: No/denies pain    Home Living                      Prior Function             PT Goals (current goals can now be found in the care plan section) Progress towards PT goals: Progressing toward goals    Frequency    Min 3X/week      PT Plan Current plan remains appropriate    Co-evaluation             End of Session Equipment Utilized During Treatment: Oxygen Activity Tolerance: Patient tolerated treatment well Patient left: in chair;with chair alarm set;with family/visitor present Nurse Communication: Mobility status PT Visit Diagnosis: Difficulty in walking, not elsewhere classified (R26.2)     Time: 4098-1191 PT Time Calculation (min) (ACUTE ONLY): 16 min  Charges:  $Therapeutic Activity: 8-22 mins                    G CodesBlanchard Kelch PT [phone]  Rada Hay 07/20/2016, 12:32 PM

## 2016-07-21 DIAGNOSIS — I4891 Unspecified atrial fibrillation: Secondary | ICD-10-CM | POA: Diagnosis not present

## 2016-07-21 DIAGNOSIS — J09X1 Influenza due to identified novel influenza A virus with pneumonia: Secondary | ICD-10-CM | POA: Diagnosis not present

## 2016-07-21 DIAGNOSIS — R05 Cough: Secondary | ICD-10-CM | POA: Diagnosis not present

## 2016-07-21 DIAGNOSIS — F329 Major depressive disorder, single episode, unspecified: Secondary | ICD-10-CM | POA: Diagnosis not present

## 2016-07-21 DIAGNOSIS — Z79899 Other long term (current) drug therapy: Secondary | ICD-10-CM | POA: Diagnosis not present

## 2016-07-21 DIAGNOSIS — K644 Residual hemorrhoidal skin tags: Secondary | ICD-10-CM | POA: Diagnosis not present

## 2016-07-21 DIAGNOSIS — E039 Hypothyroidism, unspecified: Secondary | ICD-10-CM | POA: Diagnosis not present

## 2016-07-21 DIAGNOSIS — Z833 Family history of diabetes mellitus: Secondary | ICD-10-CM | POA: Diagnosis not present

## 2016-07-21 DIAGNOSIS — J9611 Chronic respiratory failure with hypoxia: Secondary | ICD-10-CM | POA: Diagnosis not present

## 2016-07-21 DIAGNOSIS — I482 Chronic atrial fibrillation: Secondary | ICD-10-CM | POA: Diagnosis not present

## 2016-07-21 DIAGNOSIS — I25810 Atherosclerosis of coronary artery bypass graft(s) without angina pectoris: Secondary | ICD-10-CM | POA: Diagnosis not present

## 2016-07-21 DIAGNOSIS — J9601 Acute respiratory failure with hypoxia: Secondary | ICD-10-CM | POA: Diagnosis not present

## 2016-07-21 DIAGNOSIS — I959 Hypotension, unspecified: Secondary | ICD-10-CM | POA: Diagnosis not present

## 2016-07-21 DIAGNOSIS — I25700 Atherosclerosis of coronary artery bypass graft(s), unspecified, with unstable angina pectoris: Secondary | ICD-10-CM | POA: Diagnosis not present

## 2016-07-21 DIAGNOSIS — J189 Pneumonia, unspecified organism: Secondary | ICD-10-CM | POA: Diagnosis not present

## 2016-07-21 DIAGNOSIS — R4189 Other symptoms and signs involving cognitive functions and awareness: Secondary | ICD-10-CM | POA: Diagnosis not present

## 2016-07-21 DIAGNOSIS — F29 Unspecified psychosis not due to a substance or known physiological condition: Secondary | ICD-10-CM | POA: Diagnosis not present

## 2016-07-21 DIAGNOSIS — R55 Syncope and collapse: Secondary | ICD-10-CM | POA: Diagnosis not present

## 2016-07-21 DIAGNOSIS — Z86010 Personal history of colonic polyps: Secondary | ICD-10-CM | POA: Diagnosis not present

## 2016-07-21 DIAGNOSIS — Z7982 Long term (current) use of aspirin: Secondary | ICD-10-CM | POA: Diagnosis not present

## 2016-07-21 DIAGNOSIS — Z5189 Encounter for other specified aftercare: Secondary | ICD-10-CM | POA: Diagnosis not present

## 2016-07-21 DIAGNOSIS — J181 Lobar pneumonia, unspecified organism: Secondary | ICD-10-CM | POA: Diagnosis not present

## 2016-07-21 DIAGNOSIS — I700 Atherosclerosis of aorta: Secondary | ICD-10-CM | POA: Diagnosis not present

## 2016-07-21 DIAGNOSIS — R1312 Dysphagia, oropharyngeal phase: Secondary | ICD-10-CM | POA: Diagnosis not present

## 2016-07-21 DIAGNOSIS — I5032 Chronic diastolic (congestive) heart failure: Secondary | ICD-10-CM | POA: Diagnosis not present

## 2016-07-21 DIAGNOSIS — R498 Other voice and resonance disorders: Secondary | ICD-10-CM | POA: Diagnosis not present

## 2016-07-21 DIAGNOSIS — F418 Other specified anxiety disorders: Secondary | ICD-10-CM | POA: Diagnosis not present

## 2016-07-21 DIAGNOSIS — E43 Unspecified severe protein-calorie malnutrition: Secondary | ICD-10-CM | POA: Diagnosis not present

## 2016-07-21 DIAGNOSIS — J441 Chronic obstructive pulmonary disease with (acute) exacerbation: Secondary | ICD-10-CM | POA: Diagnosis not present

## 2016-07-21 DIAGNOSIS — D696 Thrombocytopenia, unspecified: Secondary | ICD-10-CM | POA: Diagnosis not present

## 2016-07-21 DIAGNOSIS — E785 Hyperlipidemia, unspecified: Secondary | ICD-10-CM | POA: Diagnosis not present

## 2016-07-21 DIAGNOSIS — K5900 Constipation, unspecified: Secondary | ICD-10-CM | POA: Diagnosis not present

## 2016-07-21 DIAGNOSIS — I2510 Atherosclerotic heart disease of native coronary artery without angina pectoris: Secondary | ICD-10-CM | POA: Diagnosis not present

## 2016-07-21 DIAGNOSIS — R0789 Other chest pain: Secondary | ICD-10-CM | POA: Diagnosis not present

## 2016-07-21 DIAGNOSIS — K449 Diaphragmatic hernia without obstruction or gangrene: Secondary | ICD-10-CM | POA: Diagnosis not present

## 2016-07-21 DIAGNOSIS — R509 Fever, unspecified: Secondary | ICD-10-CM | POA: Diagnosis not present

## 2016-07-21 DIAGNOSIS — R262 Difficulty in walking, not elsewhere classified: Secondary | ICD-10-CM | POA: Diagnosis not present

## 2016-07-21 DIAGNOSIS — I272 Pulmonary hypertension, unspecified: Secondary | ICD-10-CM | POA: Diagnosis not present

## 2016-07-21 DIAGNOSIS — B999 Unspecified infectious disease: Secondary | ICD-10-CM | POA: Diagnosis not present

## 2016-07-21 DIAGNOSIS — M6281 Muscle weakness (generalized): Secondary | ICD-10-CM | POA: Diagnosis not present

## 2016-07-21 DIAGNOSIS — I10 Essential (primary) hypertension: Secondary | ICD-10-CM | POA: Diagnosis not present

## 2016-07-21 DIAGNOSIS — I4581 Long QT syndrome: Secondary | ICD-10-CM | POA: Diagnosis not present

## 2016-07-21 DIAGNOSIS — R488 Other symbolic dysfunctions: Secondary | ICD-10-CM | POA: Diagnosis not present

## 2016-07-21 DIAGNOSIS — K5901 Slow transit constipation: Secondary | ICD-10-CM | POA: Diagnosis not present

## 2016-07-21 DIAGNOSIS — R5381 Other malaise: Secondary | ICD-10-CM | POA: Diagnosis not present

## 2016-07-21 DIAGNOSIS — I509 Heart failure, unspecified: Secondary | ICD-10-CM | POA: Diagnosis not present

## 2016-07-21 DIAGNOSIS — E782 Mixed hyperlipidemia: Secondary | ICD-10-CM | POA: Diagnosis not present

## 2016-07-21 DIAGNOSIS — F320 Major depressive disorder, single episode, mild: Secondary | ICD-10-CM | POA: Diagnosis not present

## 2016-07-21 DIAGNOSIS — R531 Weakness: Secondary | ICD-10-CM | POA: Diagnosis not present

## 2016-07-21 DIAGNOSIS — Z885 Allergy status to narcotic agent status: Secondary | ICD-10-CM | POA: Diagnosis not present

## 2016-07-21 DIAGNOSIS — F419 Anxiety disorder, unspecified: Secondary | ICD-10-CM | POA: Diagnosis not present

## 2016-07-21 DIAGNOSIS — K2270 Barrett's esophagus without dysplasia: Secondary | ICD-10-CM | POA: Diagnosis not present

## 2016-07-21 DIAGNOSIS — J1108 Influenza due to unidentified influenza virus with specified pneumonia: Secondary | ICD-10-CM | POA: Diagnosis not present

## 2016-07-21 DIAGNOSIS — R2681 Unsteadiness on feet: Secondary | ICD-10-CM | POA: Diagnosis not present

## 2016-07-21 DIAGNOSIS — J449 Chronic obstructive pulmonary disease, unspecified: Secondary | ICD-10-CM | POA: Diagnosis not present

## 2016-07-21 DIAGNOSIS — J90 Pleural effusion, not elsewhere classified: Secondary | ICD-10-CM | POA: Diagnosis not present

## 2016-07-21 DIAGNOSIS — Z888 Allergy status to other drugs, medicaments and biological substances status: Secondary | ICD-10-CM | POA: Diagnosis not present

## 2016-07-21 DIAGNOSIS — K219 Gastro-esophageal reflux disease without esophagitis: Secondary | ICD-10-CM | POA: Diagnosis not present

## 2016-07-21 DIAGNOSIS — Z87891 Personal history of nicotine dependence: Secondary | ICD-10-CM | POA: Diagnosis not present

## 2016-07-21 DIAGNOSIS — G4700 Insomnia, unspecified: Secondary | ICD-10-CM | POA: Diagnosis not present

## 2016-07-21 DIAGNOSIS — M545 Low back pain: Secondary | ICD-10-CM | POA: Diagnosis not present

## 2016-07-21 DIAGNOSIS — R2689 Other abnormalities of gait and mobility: Secondary | ICD-10-CM | POA: Diagnosis not present

## 2016-07-21 DIAGNOSIS — R0989 Other specified symptoms and signs involving the circulatory and respiratory systems: Secondary | ICD-10-CM | POA: Diagnosis not present

## 2016-07-21 DIAGNOSIS — R079 Chest pain, unspecified: Secondary | ICD-10-CM | POA: Diagnosis not present

## 2016-07-21 LAB — BASIC METABOLIC PANEL
Anion gap: 6 (ref 5–15)
BUN: 21 mg/dL — AB (ref 6–20)
CO2: 35 mmol/L — AB (ref 22–32)
Calcium: 8.3 mg/dL — ABNORMAL LOW (ref 8.9–10.3)
Chloride: 99 mmol/L — ABNORMAL LOW (ref 101–111)
Creatinine, Ser: 0.64 mg/dL (ref 0.44–1.00)
GFR calc Af Amer: >60 mL/min (ref >60)
GFR calc non Af Amer: >60 mL/min (ref >60)
GLUCOSE: 121 mg/dL — AB (ref 65–99)
POTASSIUM: 4.1 mmol/L (ref 3.5–5.1)
Sodium: 140 mmol/L (ref 135–145)

## 2016-07-21 LAB — CBC
HEMATOCRIT: 37.3 % (ref 36.0–46.0)
Hemoglobin: 12 g/dL (ref 12.0–15.0)
MCH: 30.6 pg (ref 26.0–34.0)
MCHC: 32.2 g/dL (ref 30.0–36.0)
MCV: 95.2 fL (ref 78.0–100.0)
PLATELETS: 82 10*3/uL — AB (ref 150–400)
RBC: 3.92 MIL/uL (ref 3.87–5.11)
RDW: 13.9 % (ref 11.5–15.5)
WBC: 6.2 10*3/uL (ref 4.0–10.5)

## 2016-07-21 MED ORDER — LEVOFLOXACIN 500 MG PO TABS: 500.0000 mg | ORAL_TABLET | Freq: Every day | ORAL | 0 refills | Status: DC

## 2016-07-21 MED ORDER — CLONAZEPAM 0.5 MG PO TABS: 0.5000 mg | ORAL_TABLET | Freq: Two times a day (BID) | ORAL | 0 refills | Status: DC | PRN

## 2016-07-21 MED ORDER — DOXYCYCLINE HYCLATE 100 MG PO CAPS: 100.0000 mg | ORAL_CAPSULE | Freq: Two times a day (BID) | ORAL | 0 refills | Status: DC

## 2016-07-21 NOTE — Discharge Summary (Signed)
Physician Discharge Summary  AMADEA KEAGY ZOX:[id number] DOB: 09-08-1922 DOA: 07/15/2016  PCP: Nicki Reaper, NP  Admit date: 07/15/2016 Discharge date: 07/21/2016  Admitted From: home Disposition:  SNF  Recommendations for Outpatient Follow-up:  1. Follow up with PCP in 1-2 weeks 2. Continue  Levaquin for 1 more day   Discharge Condition: stable CODE STATUS: DNR Diet recommendation: regular  HPI: Per Dr. Maryfrances Bunnell, LATANIA BASCOMB is a 83 y.o. female with a past medical history significant for CAD s/p CABG, HFpEF on noct O2, Afib not on AC, hypothyroidism, and HTN who presents with few days worsening cough and weakness. The patient was in her usual state of health until the middle of last week when she developed cough. Then the last 3-4 days she has had worsening cough, and now accompanied by progressive malaise, chills, headache, sweats, and weakness, to the point that she can barely get out of bed, sit today her daughter brought her to the emergency room.  ED course: -Afebrile, heart rate 94 and in A. fib, respirations 23, blood pressure 154/74, pulse oximetry 88% on room air -Na 136, K 3.8, Cr 1.06 (baseline 1.1), WBC 4.3K, Hgb 13 -AST minimally elevated at 48 -Platelets 80K -Troponin minimally elevated at 0.03 ng/mL -Lactic acid greater than 3 -Chest x-ray shows chronic scarring changes, cannot exclude new infiltrate, also small effusion -ECG showed rate controlled A. fib -She was given albuterol and TRH were asked to evaluate for pneumonia with sepsis  Hospital Course: Discharge Diagnoses:  Principal Problem:   Sepsis (HCC) Active Problems:   Hypothyroidism   CAD - CABG '97, cath OK 2005, Myoview low risk 12/12   HTN (hypertension)   Chronic atrial fibrillation (HCC)   Chronic diastolic CHF (congestive heart failure) (HCC)   Thrombocytopenia (HCC)   COPD with acute exacerbation (HCC)   Influenza   Acute respiratory failure with hypoxemia (HCC)   Protein-calorie  malnutrition, severe   Sepsis - Secondary to right-sided strep pneumonia as well as influenza A, she was placed on IV antibiotics as well as Tamiflu, improved, finished 5 days of Tamiflu while in the hospital, will need one additional day of doxycycline on discharge to complete 7 day treatment for her pneumonia Acute and Chronic Hypoxic Respiratory Failure - secondary to #1 above in a patient with underlying diastolic CHF, improving,overall improving,prior to admission patient was using oxygen only at bedtime, she is using oxygen continuously now Chronic atrial fibrillation - rate control improved, continue Cardizem 120 mg q hs and bisoprolol 10 mg q am for rate control, not on anticoagulation due to concerns about increased risk of falling. CHADS score > 2 CAD - priorCABG '97, cath OK 2005, Myoview low risk 03/31/16, patient's cardiologist is Dr. Allyson Sabal, no ACS type symptoms at this time HFpEF/history of Chronic diastolic CHF (congestive heart failure) - no evidence of acute exacerbation at this time, last known EF 55-60% based on echocardiogram from October 2016 ChronicThrombocytopenia - no evidence of bleeding , patient is not on anticoagulation Anxiety/depression -patient had excessive lethargy due to benzos, be judicious with benzodiazepine, dose decreased on d/c   Discharge Instructions   Allergies as of 07/21/2016      Reactions   Codeine    unknown   Meperidine Hcl    unknown   Oxycodone-acetaminophen    unknown      Medication List    TAKE these medications   acetaminophen 500 MG tablet Commonly known as:  TYLENOL Take 1 tablet (500 mg total)  by mouth every 6 (six) hours as needed for mild pain. For pain   aspirin 81 MG tablet Take 81 mg by mouth daily with breakfast.   atorvastatin 80 MG tablet Commonly known as:  LIPITOR TAKE 1 TABLET DAILY   bisoprolol 10 MG tablet Commonly known as:  ZEBETA TAKE 1 TABLET DAILY What changed:  See the new instructions.     CALCIUM 600-D 600-400 MG-UNIT tablet Generic drug:  Calcium Carbonate-Vitamin D Take 1 tablet by mouth daily.   clonazePAM 0.5 MG tablet Commonly known as:  KLONOPIN Take 1 tablet (0.5 mg total) by mouth 2 (two) times daily as needed for anxiety. What changed:  See the new instructions.   cyanocobalamin 500 MCG tablet Take 500 mcg by mouth daily.   diltiazem 120 MG 24 hr capsule Commonly known as:  TIAZAC Take 1 capsule (120 mg total) by mouth daily. What changed:  when to take this   furosemide 20 MG tablet Commonly known as:  LASIX TAKE 1 TABLET DAILY (MUST SCHEDULE ANNUAL EXAM) What changed:  See the new instructions.   ipratropium-albuterol 0.5-2.5 (3) MG/3ML Soln Commonly known as:  DUONEB Take 3 mLs by nebulization every 6 (six) hours as needed. What changed:  reasons to take this   levofloxacin 500 MG tablet Commonly known as:  LEVAQUIN Take 1 tablet (500 mg total) by mouth daily.   levothyroxine 88 MCG tablet Commonly known as:  SYNTHROID, LEVOTHROID TAKE 1 TABLET DAILY   mirtazapine 45 MG tablet Commonly known as:  REMERON TAKE 1 TABLET AT BEDTIME   multivitamin capsule Take 1 capsule by mouth daily.   omeprazole 20 MG capsule Commonly known as:  PRILOSEC TAKE 1 CAPSULE DAILY What changed:  See the new instructions.   OXYGEN Inhale 2 L into the lungs at bedtime.   potassium chloride SA 20 MEQ tablet Commonly known as:  KLOR-CON M20 Take 1 tablet (20 mEq total) by mouth daily. What changed:  when to take this   QUEtiapine 25 MG tablet Commonly known as:  SEROQUEL TAKE 1 TABLET AT BEDTIME   ROBITUSSIN COUGH+CHEST CONG DM 10-200 MG Caps Generic drug:  Dextromethorphan-Guaifenesin Take 2 capsules by mouth 2 (two) times daily as needed (for cough).   vitamin E 400 UNIT capsule Take 400 Units by mouth daily.      Contact information for after-discharge care    Destination    [facility] Follow up.   Specialty:  Skilled Nursing  Facility Contact information: [address] [place] [address] [phone]             Allergies  Allergen Reactions  . Codeine     unknown  . Meperidine Hcl     unknown  . Oxycodone-Acetaminophen     unknown    Consultations:  None   Procedures/Studies:  None   Dg Chest 2 View  Result Date: 07/15/2016 CLINICAL DATA:  Cough for 3-4 days, headache for 2-3 days, atrial fibrillation, coronary artery disease, hypertension, GERD, former smoker EXAM: CHEST  2 VIEW COMPARISON:  09/09/2015 FINDINGS: Normal heart size post CABG. Calcified tortuous aorta. Pulmonary vascularity normal. Bronchitic changes with chronic accentuation of RIGHT upper lobe markings, little changed. Persistent blunting of the LEFT lateral costophrenic angle and mild LEFT basilar atelectasis, with associated small LEFT pleural effusion. Minimal RIGHT basilar atelectasis. No definite acute infiltrate or pneumothorax. Bones diffusely demineralized. IMPRESSION: Post CABG. Aortic atherosclerosis. LEFT pleural effusion with bibasilar atelectasis greater on LEFT. Underlying emphysematous changes  with chronic increase in RIGHT upper lobe markings, may represent scarring, unable to exclude recurrent infiltrate. Electronically Signed   By: Ulyses Southward M.D.   On: 07/15/2016 17:48   Ct Head Wo Contrast  Result Date: 07/16/2016 CLINICAL DATA:  Altered mental status, lethargy, hypertension, coronary artery disease, atrial fibrillation EXAM: CT HEAD WITHOUT CONTRAST TECHNIQUE: Contiguous axial images were obtained from the base of the skull through the vertex without intravenous contrast. COMPARISON:  07/15/2016 FINDINGS: Brain: Generalized atrophy. Normal ventricular morphology. No midline shift or mass effect. Question new lacunar infarct at the LEFT external capsule. Question additional lacunar infarct at LEFT thalamus. Small vessel chronic ischemic changes of deep cerebral white matter. No acute  cortical infarction identified. No intracranial hemorrhage, mass lesion, or extra-axial fluid collection. Vascular: Atherosclerotic calcification of the internal carotid and vertebral arteries at the skullbase. Skull: Diffuse osseous demineralization Sinuses/Orbits: Cleared Other: N/A IMPRESSION: Atrophy with small vessel chronic ischemic changes of deep cerebral white matter. Questionable lacunar infarcts at the LEFT thalamus and LEFT external capsule. Electronically Signed   By: Ulyses Southward M.D.   On: 07/16/2016 17:16   Ct Head Wo Contrast  Result Date: 07/15/2016 CLINICAL DATA:  Cough and headache x3 days. EXAM: CT HEAD WITHOUT CONTRAST TECHNIQUE: Contiguous axial images were obtained from the base of the skull through the vertex without intravenous contrast. COMPARISON:  01/22/2015 CT FINDINGS: BRAIN: There is sulcal prominence consistent with superficial atrophy similar to prior exam. No intraparenchymal hemorrhage, mass effect nor midline shift. Periventricular and subcortical white matter hypodensities consistent with chronic mild small vessel ischemic disease are identified. No acute large vascular territory infarcts. No abnormal extra-axial fluid collections. Basal cisterns are not effaced and midline. VASCULAR: Moderate calcific atherosclerosis of the carotid siphons and both vertebral arteries. SKULL: No skull fracture. No significant scalp soft tissue swelling. SINUSES/ORBITS: The mastoid air-cells are clear. The included paranasal sinuses are well-aerated.The included ocular globes and orbital contents are non-suspicious. OTHER: None. IMPRESSION: Mild superficial atrophy with chronic small vessel ischemic disease periventricular white matter. No acute intracranial abnormality noted. Electronically Signed   By: Tollie Eth M.D.   On: 07/15/2016 20:07      Subjective: - no chest pain, shortness of breath, no abdominal pain, nausea or vomiting.   Discharge Exam: Vitals:   07/20/16 2255  07/21/16 0555  BP: (!) 171/80 (!) 145/81  Pulse: 87 61  Resp: 18 18  Temp: 97.9 F (36.6 C) 97.7 F (36.5 C)   Vitals:   07/20/16 0525 07/20/16 1400 07/20/16 2255 07/21/16 0555  BP: 132/67 (!) 119/58 (!) 171/80 (!) 145/81  Pulse: 72 87 87 61  Resp: Temp: 97.9 F (36.6 C) 97.7 F (36.5 C) 97.9 F (36.6 C) 97.7 F (36.5 C)  TempSrc: Oral Oral Oral Oral  SpO2: 99% 94% 96% 98%  Weight:    48.9 kg (107 lb 12.9 oz)  Height:        General: Pt is alert, awake, not in acute distress Cardiovascular: RRR, S1/S2 +, no rubs, no gallops Respiratory: CTA bilaterally, no wheezing, no rhonchi Abdominal: Soft, NT, ND, bowel sounds + Extremities: no edema, no cyanosis    The results of significant diagnostics from this hospitalization (including imaging, microbiology, ancillary and laboratory) are listed below for reference.     Microbiology: Recent Results (from the past 240 hour(s))  Blood Culture (routine x 2)     Status: None   Collection Time: 07/15/16  7:49 PM  Result  Value Ref Range Status   Specimen Description BLOOD LEFT ANTECUBITAL  Final   Special Requests IN PEDIATRIC BOTTLE 1CC  Final   Culture   Final    NO GROWTH 5 DAYS Performed at [facility], [address] [place], [place] [address]    Report Status 07/20/2016 FINAL  Final  Blood Culture (routine x 2)     Status: None   Collection Time: 07/15/16  7:49 PM  Result Value Ref Range Status   Specimen Description BLOOD LEFT ANTECUBITAL  Final   Special Requests IN PEDIATRIC BOTTLE 1CC  Final   Culture   Final    NO GROWTH 5 DAYS Performed at [facility], [address] [place], [place] [address]    Report Status 07/20/2016 FINAL  Final  Culture, Urine     Status: Abnormal   Collection Time: 07/15/16  9:25 PM  Result Value Ref Range Status   Specimen Description URINE, CLEAN CATCH  Final   Special Requests NONE  Final   Culture MULTIPLE SPECIES PRESENT, SUGGEST RECOLLECTION (A)   Final   Report Status 07/17/2016 FINAL  Final  MRSA PCR Screening     Status: None   Collection Time: 07/16/16  1:48 AM  Result Value Ref Range Status   MRSA by PCR NEGATIVE NEGATIVE Final    Comment:        The GeneXpert MRSA Assay (FDA approved for NASAL specimens only), is one component of a comprehensive MRSA colonization surveillance program. It is not intended to diagnose MRSA infection nor to guide or monitor treatment for MRSA infections.      Labs: BNP (last 3 results) No results for input(s): BNP in the last 8760 hours. Basic Metabolic Panel:  Recent Labs Lab 07/15/16 1627 07/15/16 2343 07/16/16 0317 07/17/16 0328 07/18/16 0333 07/21/16 0519  NA 136  --  137 140 137 140  K 3.8  --  3.4* 3.5 3.9 4.1  CL 96*  --  100* 105 104 99*  CO2 31  --  35*  GLUCOSE 128*  --  111* 75 103* 121*  BUN 16  --  21*  CREATININE 1.06*  --  0.99 0.82 0.73 0.64  CALCIUM 8.8*  --  8.1* 8.0* 8.1* 8.3*  MG  --  1.9  --   --   --   --    Liver Function Tests:  Recent Labs Lab 07/15/16 1627  AST 48*  ALT 21  ALKPHOS 89  BILITOT 1.0  PROT 7.0  ALBUMIN 4.1   No results for input(s): LIPASE, AMYLASE in the last 168 hours. No results for input(s): AMMONIA in the last 168 hours. CBC:  Recent Labs Lab 07/15/16 1627 07/16/16 0317 07/17/16 0328 07/21/16 0519  WBC 4.3 6.6 8.7 6.2  NEUTROABS 3.2  --   --   --   HGB 13.0 13.2 12.3 12.0  HCT 38.9 41.3 37.8 37.3  MCV 91.5 95.6 96.2 95.2  PLT 80* 61* 69* 82*   Cardiac Enzymes:  Recent Labs Lab 07/15/16 1627 07/15/16 2343 07/16/16 0317  TROPONINI 0.03* 0.04* 0.05*   BNP: Invalid input(s): POCBNP CBG:  Recent Labs Lab 07/16/16 1325  GLUCAP 123*   D-Dimer No results for input(s): DDIMER in the last 72 hours. Hgb A1c No results for input(s): HGBA1C in the last 72 hours. Lipid Profile No results for input(s): CHOL, HDL, LDLCALC, TRIG, CHOLHDL, LDLDIRECT in the last 72 hours. Thyroid  function studies No results for input(s): TSH, T4TOTAL, T3FREE, THYROIDAB in the last 72 hours.  Invalid input(s): FREET3 Anemia work up No results for input(s): VITAMINB12, FOLATE, FERRITIN, TIBC, IRON, RETICCTPCT in the last 72 hours. Urinalysis    Component Value Date/Time   COLORURINE YELLOW 07/15/2016 2125   APPEARANCEUR HAZY (A) 07/15/2016 2125   LABSPEC 1.011 07/15/2016 2125   PHURINE 6.0 07/15/2016 2125   GLUCOSEU NEGATIVE 07/15/2016 2125   HGBUR LARGE (A) 07/15/2016 2125   HGBUR 1+ 05/06/2010 1520   BILIRUBINUR NEGATIVE 07/15/2016 2125   KETONESUR NEGATIVE 07/15/2016 2125   PROTEINUR NEGATIVE 07/15/2016 2125   UROBILINOGEN 1.0 01/22/2015 1721   NITRITE NEGATIVE 07/15/2016 2125   LEUKOCYTESUR NEGATIVE 07/15/2016 2125   Sepsis Labs Invalid input(s): PROCALCITONIN,  WBC,  LACTICIDVEN Microbiology Recent Results (from the past 240 hour(s))  Blood Culture (routine x 2)     Status: None   Collection Time: 07/15/16  7:49 PM  Result Value Ref Range Status   Specimen Description BLOOD LEFT ANTECUBITAL  Final   Special Requests IN PEDIATRIC BOTTLE 1CC  Final   Culture   Final    NO GROWTH 5 DAYS Performed at [facility], [address], [place], [place] [address]    Report Status 07/20/2016 FINAL  Final  Blood Culture (routine x 2)     Status: None   Collection Time: 07/15/16  7:49 PM  Result Value Ref Range Status   Specimen Description BLOOD LEFT ANTECUBITAL  Final   Special Requests IN PEDIATRIC BOTTLE 1CC  Final   Culture   Final    NO GROWTH 5 DAYS Performed at [facility], [address], [place], [place] [address]    Report Status 07/20/2016 FINAL  Final  Culture, Urine     Status: Abnormal   Collection Time: 07/15/16  9:25 PM  Result Value Ref Range Status   Specimen Description URINE, CLEAN CATCH  Final   Special Requests NONE  Final   Culture MULTIPLE SPECIES PRESENT, SUGGEST RECOLLECTION (A)  Final   Report Status 07/17/2016 FINAL  Final    MRSA PCR Screening     Status: None   Collection Time: 07/16/16  1:48 AM  Result Value Ref Range Status   MRSA by PCR NEGATIVE NEGATIVE Final    Comment:        The GeneXpert MRSA Assay (FDA approved for NASAL specimens only), is one component of a comprehensive MRSA colonization surveillance program. It is not intended to diagnose MRSA infection nor to guide or monitor treatment for MRSA infections.      Time coordinating discharge: 40 minutes  SIGNED:  Pamella Pert, MD  [facility] 07/21/2016, 10:13 AM Pager [phone]  If 7PM-7AM, please contact night-coverage [email or web address] Password [email or web address]

## 2016-07-21 NOTE — Clinical Social Work Placement (Signed)
Patient received and accepted bed offer at [facility]. [facility] contacted, family notified. Patient's RN can call report to [phone], patient going to room [facility]. Patient's RN aware, packet complete.  CLINICAL SOCIAL WORK PLACEMENT  NOTE  Date:  07/21/2016  Patient Details  Name: Jasmine [name] MRN: [id number] Date of Birth: 02-Feb-1923  Clinical Social Work is seeking post-discharge placement for this patient at the Skilled  Nursing Facility level of care (*CSW will initial, date and re-position this form in  chart as items are completed):  Yes   Patient/family provided with [place] Clinical Social Work Department's list of facilities offering this level of care within the geographic area requested by the patient (or if unable, by the patient's family).  Yes   Patient/family informed of their freedom to choose among providers that offer the needed level of care, that participate in Medicare, Medicaid or managed care program needed by the patient, have an available bed and are willing to accept the patient.  Yes   Patient/family informed of [place]'s ownership interest in [facility] and [facility], as well as of the fact that they are under no obligation to receive care at these facilities.  PASRR submitted to EDS on       PASRR number received on 07/18/16     Existing PASRR number confirmed on       FL2 transmitted to all facilities in geographic area requested by pt/family on 07/18/16     FL2 transmitted to all facilities within larger geographic area on       Patient informed that his/her managed care company has contracts with or will negotiate with certain facilities, including the following:        Yes   Patient/family informed of bed offers received.  Patient chooses bed at [facility]     Physician recommends and patient chooses bed at      Patient to be transferred to [facility] on 07/21/16.  Patient to be transferred to facility by [facility]      Patient family notified on 07/21/16 of transfer.  Name of family member notified:  [facility]     PHYSICIAN       Additional Comment:    _______________________________________________ Antionette Poles, LCSW 07/21/2016, 12:44 PM

## 2016-07-23 ENCOUNTER — Non-Acute Institutional Stay (SKILLED_NURSING_FACILITY): Payer: Medicare Other | Admitting: Internal Medicine

## 2016-07-23 ENCOUNTER — Encounter: Payer: Self-pay | Admitting: Internal Medicine

## 2016-07-23 DIAGNOSIS — J9611 Chronic respiratory failure with hypoxia: Secondary | ICD-10-CM | POA: Diagnosis not present

## 2016-07-23 DIAGNOSIS — I2510 Atherosclerotic heart disease of native coronary artery without angina pectoris: Secondary | ICD-10-CM | POA: Diagnosis not present

## 2016-07-23 DIAGNOSIS — D696 Thrombocytopenia, unspecified: Secondary | ICD-10-CM

## 2016-07-23 DIAGNOSIS — J09X1 Influenza due to identified novel influenza A virus with pneumonia: Secondary | ICD-10-CM | POA: Diagnosis not present

## 2016-07-23 DIAGNOSIS — E039 Hypothyroidism, unspecified: Secondary | ICD-10-CM

## 2016-07-23 DIAGNOSIS — R5381 Other malaise: Secondary | ICD-10-CM | POA: Diagnosis not present

## 2016-07-23 DIAGNOSIS — I5032 Chronic diastolic (congestive) heart failure: Secondary | ICD-10-CM | POA: Diagnosis not present

## 2016-07-23 DIAGNOSIS — I482 Chronic atrial fibrillation, unspecified: Secondary | ICD-10-CM

## 2016-07-23 DIAGNOSIS — F418 Other specified anxiety disorders: Secondary | ICD-10-CM

## 2016-07-23 DIAGNOSIS — E43 Unspecified severe protein-calorie malnutrition: Secondary | ICD-10-CM

## 2016-07-23 DIAGNOSIS — J189 Pneumonia, unspecified organism: Secondary | ICD-10-CM | POA: Diagnosis not present

## 2016-07-23 NOTE — Progress Notes (Signed)
LOCATION: [facility]  PCP: Nicki Reaper, NP   Code Status: DNR  Goals of care: Advanced Directive information Advanced Directives 07/23/2016  Does Patient Have a Medical Advance Directive? Yes  Type of Advance Directive Out of facility DNR (pink MOST or yellow form)  Does patient want to make changes to medical advance directive? No - Patient declined  Copy of Healthcare Power of Attorney in Chart? -  Would patient like information on creating a medical advance directive? -       Extended Emergency Contact Information Primary Emergency Contact: Maness,[name] Address: [address]          [place], [place] [address] [place] Home Phone: [phone] Mobile Phone: [phone] Relation: Daughter Secondary Emergency Contact: Melba [place] Work Phone: [phone] Mobile Phone: [phone] Relation: Grandaughter   Allergies  Allergen Reactions  . Codeine     unknown  . Meperidine Hcl     unknown  . Oxycodone-Acetaminophen     unknown    Chief Complaint  Patient presents with  . New Admit To SNF    New Admission Visit      HPI:  Patient is a 83 y.o. female seen today for short term rehabilitation post hospital admission from 07/15/16-07/21/16 with generalized weakness and acute on chronic respiratory failure. She was treated for sepsis with bibasilar streptococcus pneumonia and influenza. She has completed her tamiflu and antibiotic course. She is seen in her room today. She has medical history of CAD, chronic afib, chronic CHF, COPD, GERD, HTN among others.   Review of Systems:  Constitutional: Negative for fever, chills, diaphoresis. Energy level is fair right now. HENT: Negative for headache, congestion, nasal discharge, sore throat, difficulty swallowing.   Eyes: Negative for eye pain, blurred vision, double vision and discharge.  Respiratory: Negative for  shortness of breath and wheezing. Positive for dry cough.   Cardiovascular: Negative for chest pain, palpitations, leg swelling.  Gastrointestinal: Negative for heartburn, nausea, vomiting,loss of appetite, melena, diarrhea and constipation. Positive for generalized abdominal pain x 2 days. Last bowel movement was yesterday.  Genitourinary: Negative for dysuria.  Musculoskeletal: Negative for back pain, fall in the facility.  Skin: Negative for itching, rash.  Neurological: Negative for dizziness. Psychiatric/Behavioral: Negative for depression.   Past Medical History:  Diagnosis Date  . Anxiety   . Atrial fibrillation with RVR (HCC)   . Barrett's esophagus   . CAD (coronary artery disease) of artery bypass graft 1995  . Fracture of right shoulder 02/2008  . GERD (gastroesophageal reflux disease)   . Hemorrhoids, external   . Hiatal hernia   . History of colon polyps   . Hyperlipidemia   . Hypertension   . Hypothyroidism   . Insomnia due to medical condition   . Left wrist fracture 09/2007  . Low back pain   . Syncope    Past Surgical History:  Procedure Laterality Date  . CARDIAC CATHETERIZATION  02/25/1996   No intervention-recommend CABG. Left main-80% concentric distal stenosis, LAD-99% ostial stenosis, first diag-80% segmental proximal stenosis, Circumflex-80% ostial stenosis, RCA-90% ostial stenosis.  Marland Kitchen CARDIAC CATHETERIZATION  05/21/2003   No intervention-widely patent grafts.  . CAROTID DOPPLER Bilateral 05/15/2010   Right Bulb/CEA-demonstrated trace irregular nonhemodynamically significant plaque 0-49%. Left Bulb/Proximal ICA-demonstrated irregular nonhemodynamically signifcant plaque 0-49%.  . CAROTID ENDARTERECTOMY    . CATARACT EXTRACTION    . COLONOSCOPY W/ POLYPECTOMY  06/2005  . CORONARY ARTERY BYPASS GRAFT  1997  .  LEXISCAN MYOVIEW  04/03/2011   No scintigraphic evidence of inducible myocardial ischemia. No lexiscan EKG changes. Non-diagnostic for ischemia.   . TRANSTHORACIC ECHOCARDIOGRAM  01/08/2011   EF 50-55%, LA  moderately dilated, moderate tricuspid regurg, mild pulmonary hypertension.   Social History:   reports that she quit smoking about 48 years ago. She has never used smokeless tobacco. She reports that she does not drink alcohol or use drugs.  Family History  Problem Relation Age of Onset  . Heart disease Mother   . Heart disease Father   . Diabetes Son     Medications: Allergies as of 07/23/2016      Reactions   Codeine    unknown   Meperidine Hcl    unknown   Oxycodone-acetaminophen    unknown      Medication List       Accurate as of 07/23/16 11:50 AM. Always use your most recent med list.          acetaminophen 500 MG tablet Commonly known as:  TYLENOL Take 1 tablet (500 mg total) by mouth every 6 (six) hours as needed for mild pain. For pain   aspirin 81 MG chewable tablet Chew 81 mg by mouth daily.   atorvastatin 80 MG tablet Commonly known as:  LIPITOR TAKE 1 TABLET DAILY   bisoprolol 10 MG tablet Commonly known as:  ZEBETA TAKE 1 TABLET DAILY   CALCIUM 600-D 600-400 MG-UNIT tablet Generic drug:  Calcium Carbonate-Vitamin D Take 1 tablet by mouth daily.   clonazePAM 0.5 MG tablet Commonly known as:  KLONOPIN Take 1 tablet (0.5 mg total) by mouth 2 (two) times daily as needed for anxiety.   cyanocobalamin 500 MCG tablet Take 500 mcg by mouth daily.   diltiazem 120 MG 24 hr capsule Commonly known as:  TIAZAC Take 1 capsule (120 mg total) by mouth daily.   furosemide 20 MG tablet Commonly known as:  LASIX TAKE 1 TABLET DAILY (MUST SCHEDULE ANNUAL EXAM)   ipratropium-albuterol 0.5-2.5 (3) MG/3ML Soln Commonly known as:  DUONEB Take 3 mLs by nebulization every 6 (six) hours as needed.   levothyroxine 88 MCG tablet Commonly known as:  SYNTHROID, LEVOTHROID TAKE 1 TABLET DAILY   magnesium hydroxide 400 MG/5ML suspension Commonly known as:  MILK OF MAGNESIA Take 45 mLs by mouth daily as needed for mild constipation. Stop date 07/24/16   mirtazapine  45 MG tablet Commonly known as:  REMERON TAKE 1 TABLET AT BEDTIME   multivitamin capsule Take 1 capsule by mouth daily.   omeprazole 20 MG capsule Commonly known as:  PRILOSEC TAKE 1 CAPSULE DAILY   OXYGEN Inhale 2 L into the lungs at bedtime.   potassium chloride SA 20 MEQ tablet Commonly known as:  KLOR-CON M20 Take 1 tablet (20 mEq total) by mouth daily.   QUEtiapine 25 MG tablet Commonly known as:  SEROQUEL TAKE 1 TABLET AT BEDTIME   ROBITUSSIN COUGH/CONGESTION PO Take 10 mLs by mouth every 8 (eight) hours as needed.   vitamin E 400 UNIT capsule Take 400 Units by mouth daily.       Immunizations: Immunization History  Administered Date(s) Administered  . Influenza Split 02/13/2011  . Influenza Whole 04/20/2004, 02/16/2007, 01/20/2008, 01/29/2009, 01/08/2010  . Pneumococcal Polysaccharide-23 01/08/2010     Physical Exam:  Vitals:   07/23/16 1129  BP: 126/60  Pulse: 66  Resp: 20  Temp: 98 F (36.7 C)  TempSrc: Oral  SpO2: 96%  Weight: 107 lb 12.8 oz (48.9  kg)  Height:  (1.651 m)   Body mass index is 17.94 kg/m.  General- elderly female, frail and thin built, in no acute distress Head- normocephalic, atraumatic Nose- no maxillary or frontal sinus tenderness, no nasal discharge Throat- moist mucus membrane, normal oropharynx, missing dentition Eyes- PERRLA, EOMI, no pallor, no icterus, no discharge, normal conjunctiva, normal sclera Neck- no cervical lymphadenopathy Cardiovascular- irregular heart rate but controlled, no murmur Respiratory- bilateral decreased air entry with rhonchi present, no wheeze, no rhonchi, no crackles, no use of accessory muscles, on chronic 2 l oxygen by nasal canula Abdomen- bowel sounds present, soft, non tender, no guarding or rigidity Musculoskeletal- able to move all 4 extremities, generalized weakness, no leg edema Neurological- alert and oriented to person, place and month but not to year Skin- warm and dry,  ecchymosis to both arms Psychiatry- normal mood and affect    Labs reviewed: Basic Metabolic Panel:  Recent Labs  16/10/96 2343  07/17/16 0328 07/18/16 0333 07/21/16 0519  NA  --   < > 140 137 140  K  --   < > 3.5 3.9 4.1  CL  --   < > 105 104 99*  CO2  --   < > 25 28 35*  GLUCOSE  --   < > 75 103* 121*  BUN  --   < > 17 20 21*  CREATININE  --   < > 0.82 0.73 0.64  CALCIUM  --   < > 8.0* 8.1* 8.3*  MG 1.9  --   --   --   --   < > = values in this interval not displayed. Liver Function Tests:  Recent Labs  11/20/15 1630 05/22/16 1453 07/15/16 1627  AST 35 32 48*  ALT ALKPHOS 116 95 89  BILITOT 0.7 0.8 1.0  PROT 7.0 7.1 7.0  ALBUMIN 3.9 3.8 4.1   No results for input(s): LIPASE, AMYLASE in the last 8760 hours. No results for input(s): AMMONIA in the last 8760 hours. CBC:  Recent Labs  07/15/16 1627 07/16/16 0317 07/17/16 0328 07/21/16 0519  WBC 4.3 6.6 8.7 6.2  NEUTROABS 3.2  --   --   --   HGB 13.0 13.2 12.3 12.0  HCT 38.9 41.3 37.8 37.3  MCV 91.5 95.6 96.2 95.2  PLT 80* 61* 69* 82*   Cardiac Enzymes:  Recent Labs  07/15/16 1627 07/15/16 2343 07/16/16 0317  TROPONINI 0.03* 0.04* 0.05*   BNP: Invalid input(s): POCBNP CBG:  Recent Labs  07/16/16 1325  GLUCAP 123*    Radiological Exams: Dg Chest 2 View  Result Date: 07/15/2016 CLINICAL DATA:  Cough for 3-4 days, headache for 2-3 days, atrial fibrillation, coronary artery disease, hypertension, GERD, former smoker EXAM: CHEST  2 VIEW COMPARISON:  09/09/2015 FINDINGS: Normal heart size post CABG. Calcified tortuous aorta. Pulmonary vascularity normal. Bronchitic changes with chronic accentuation of RIGHT upper lobe markings, little changed. Persistent blunting of the LEFT lateral costophrenic angle and mild LEFT basilar atelectasis, with associated small LEFT pleural effusion. Minimal RIGHT basilar atelectasis. No definite acute infiltrate or pneumothorax. Bones diffusely demineralized.  IMPRESSION: Post CABG. Aortic atherosclerosis. LEFT pleural effusion with bibasilar atelectasis greater on LEFT. Underlying emphysematous changes with chronic increase in RIGHT upper lobe markings, may represent scarring, unable to exclude recurrent infiltrate. Electronically Signed   By: Ulyses Southward M.D.   On: 07/15/2016 17:48   Ct Head Wo Contrast  Result Date: 07/16/2016 CLINICAL DATA:  Altered mental  status, lethargy, hypertension, coronary artery disease, atrial fibrillation EXAM: CT HEAD WITHOUT CONTRAST TECHNIQUE: Contiguous axial images were obtained from the base of the skull through the vertex without intravenous contrast. COMPARISON:  07/15/2016 FINDINGS: Brain: Generalized atrophy. Normal ventricular morphology. No midline shift or mass effect. Question new lacunar infarct at the LEFT external capsule. Question additional lacunar infarct at LEFT thalamus. Small vessel chronic ischemic changes of deep cerebral white matter. No acute cortical infarction identified. No intracranial hemorrhage, mass lesion, or extra-axial fluid collection. Vascular: Atherosclerotic calcification of the internal carotid and vertebral arteries at the skullbase. Skull: Diffuse osseous demineralization Sinuses/Orbits: Cleared Other: N/A IMPRESSION: Atrophy with small vessel chronic ischemic changes of deep cerebral white matter. Questionable lacunar infarcts at the LEFT thalamus and LEFT external capsule. Electronically Signed   By: Ulyses Southward M.D.   On: 07/16/2016 17:16   Ct Head Wo Contrast  Result Date: 07/15/2016 CLINICAL DATA:  Cough and headache x3 days. EXAM: CT HEAD WITHOUT CONTRAST TECHNIQUE: Contiguous axial images were obtained from the base of the skull through the vertex without intravenous contrast. COMPARISON:  01/22/2015 CT FINDINGS: BRAIN: There is sulcal prominence consistent with superficial atrophy similar to prior exam. No intraparenchymal hemorrhage, mass effect nor midline shift. Periventricular  and subcortical white matter hypodensities consistent with chronic mild small vessel ischemic disease are identified. No acute large vascular territory infarcts. No abnormal extra-axial fluid collections. Basal cisterns are not effaced and midline. VASCULAR: Moderate calcific atherosclerosis of the carotid siphons and both vertebral arteries. SKULL: No skull fracture. No significant scalp soft tissue swelling. SINUSES/ORBITS: The mastoid air-cells are clear. The included paranasal sinuses are well-aerated.The included ocular globes and orbital contents are non-suspicious. OTHER: None. IMPRESSION: Mild superficial atrophy with chronic small vessel ischemic disease periventricular white matter. No acute intracranial abnormality noted. Electronically Signed   By: Tollie Eth M.D.   On: 07/15/2016 20:07    Assessment/Plan  Physical deconditioning Will have her work with physical therapy and occupational therapy team to help with gait training and muscle strengthening exercises.fall precautions. Skin care. Encourage to be out of bed.   HCAP Bibasilar infiltrates with effusion noted on chest xray. Has completed her course of antibiotics. Provide incentive spirometer and encourage use to prevent atelectasis.  Influenza A Completed course of tamiflu. Breathing stable, monitor  Chronic respiratory failure With acute exacerbation from pneumonia. Continue o2 by nasal canula. Continue prn duoneb and monitor. Continue prn robitussin.   Depression and Anxiety Stable at present, continue seroquel 25 mg qhs and remeron 45 mg daily, continue clonazepam 0.5 mg q12h prn x 2 weeks and reassess if needs frequent dosing.   Hypothyroidism Lab Results  Component Value Date   TSH 0.834 07/15/2016   Continue levothyroxine current regimen  Severe protein calorie malnutrition With low BMI. Monitor and encourage po intake. Monitor weight. RD to evaluate. Continue MVI.gerd Denies symptom this visit, continue  omeprazole  CAD s./p CABG. Continue aspirin 81 mg daily, bisoprolol 10 mg daily, atorvastatin 80 mg daily  Chronic afib Controlled HR on bisoprolol and diltiazem. Off anticoagulation with high fall risk. Continue ASA 81 mg daily.   Chronic thrombocytopenia No bleed reported, monitor platelet count with her on aspirin.   Chronic diastolic chf Ef 55-60%. Continue lasix 20 mg daily and bisoprolol 10 mg daily, monitor weight 3 days a week, check bmp. Continue kcl supplement   Goals of care: short term rehabilitation   Labs/tests ordered: cbc, bmp 07/27/16  Family/ staff Communication: reviewed care plan with  patient and nursing supervisor  I have spent greater than 50 minutes for this encounter which includes reviewing hospital records, addressing above mentioned concerns, reviewing care plan with patient, answering patient's concerns and counseling her.     Oneal Grout, MD Internal Medicine [facility] [address] [place], [place] [address] Cell Phone (Monday-Friday 8 am - 5 pm): [phone] On Call: [phone] and follow prompts after 5 pm and on weekends Office Phone: [phone] Office Fax: [phone]

## 2016-07-27 ENCOUNTER — Telehealth: Payer: Self-pay

## 2016-07-27 LAB — CBC AND DIFFERENTIAL
HCT: 42 % (ref 36–46)
HEMOGLOBIN: 13.6 g/dL (ref 12.0–16.0)
Platelets: 168 10*3/uL (ref 150–399)
WBC: 6.6 10^3/mL

## 2016-07-27 LAB — BASIC METABOLIC PANEL
BUN: 11 mg/dL (ref 4–21)
CREATININE: 0.8 mg/dL (ref 0.5–1.1)
Glucose: 95 mg/dL
POTASSIUM: 3.7 mmol/L (ref 3.4–5.3)
SODIUM: 141 mmol/L (ref 137–147)

## 2016-07-27 NOTE — Telephone Encounter (Signed)
Jasmine [name] (DPR signed) left v/m requesting to stop all pts meds while in rehab. When I called Jasmine back she was on phone with [facility] stopping auto refills until pt gets out of rehab. Nothing further needed.

## 2016-07-31 ENCOUNTER — Encounter: Payer: Self-pay | Admitting: Family

## 2016-07-31 ENCOUNTER — Non-Acute Institutional Stay (SKILLED_NURSING_FACILITY): Payer: Medicare Other | Admitting: Family

## 2016-07-31 DIAGNOSIS — J181 Lobar pneumonia, unspecified organism: Secondary | ICD-10-CM

## 2016-07-31 DIAGNOSIS — F329 Major depressive disorder, single episode, unspecified: Secondary | ICD-10-CM

## 2016-07-31 DIAGNOSIS — J189 Pneumonia, unspecified organism: Secondary | ICD-10-CM

## 2016-07-31 DIAGNOSIS — F32A Depression, unspecified: Secondary | ICD-10-CM

## 2016-07-31 MED ORDER — SERTRALINE HCL 25 MG PO TABS: 25.0000 mg | ORAL_TABLET | Freq: Every day | ORAL | Status: DC

## 2016-07-31 NOTE — Progress Notes (Signed)
Location:  [facility] Room Number: 1201 Place of Service:  SNF (31) Provider: Mikhala Kenan FNP-C  Nicki Reaper, NP  Patient Care Team: Lorre Munroe, NP as PCP - General (Internal Medicine)  Extended Emergency Contact Information Primary Emergency Contact: Maness,[name] Address: [address]          [place], [place] [address] [place] Home Phone: [phone] Mobile Phone: [phone] Relation: Daughter Secondary Emergency Contact: Melba [place] Work Phone: [phone] Mobile Phone: [phone] Relation: Grandaughter  Code Status:  DNR  Goals of care: Advanced Directive information Advanced Directives 07/31/2016  Does Patient Have a Medical Advance Directive? Yes  Type of Advance Directive Out of facility DNR (pink MOST or yellow form)  Does patient want to make changes to medical advance directive? No - Patient declined  Copy of Healthcare Power of Attorney in Chart? -  Would patient like information on creating a medical advance directive? -  Pre-existing out of facility DNR order (yellow form or pink MOST form) Pink MOST form placed in chart (order not valid for inpatient use)     Chief Complaint  Patient presents with  . Acute Visit    depression    HPI:  Pt is a 83 y.o. female seen today at [facility] for an acute visit for evaluation of depression. She has a medical history of HTN, Afib, CHF, COPD, Anxiety, Hypothyroidism, hyperlipemia among other conditions. She is seen in her room today with her daughter at bedside. Patient's daughter reports patient wants to give up in life.Refuses to eat at times. Patient states sometimes has good days and at times bad days.she feels better today reading her newspaper in the bed. She reports feeling anxious at times. Encouraged to request for her clonazepam PRN med. Of note she is here for short term rehabilitation post  hospital admission from 07/15/16-07/21/16 with generalized weakness and acute on chronic respiratory failure. She was treated for sepsis with bibasilar streptococcus pneumonia and influenza. She completed her tamiflu and antibiotic course.   She is now on oral antibiotics for recent CXR showed right upper lobe infiltrate with small left effusion (07/28/2016).   Past Medical History:  Diagnosis Date  . Anxiety   . Atrial fibrillation with RVR (HCC)   . Barrett's esophagus   . CAD (coronary artery disease) of artery bypass graft 1995  . Fracture of right shoulder 02/2008  . GERD (gastroesophageal reflux disease)   . Hemorrhoids, external   . Hiatal hernia   . History of colon polyps   . Hyperlipidemia   . Hypertension   . Hypothyroidism   . Insomnia due to medical condition   . Left wrist fracture 09/2007  . Low back pain   . Syncope    Past Surgical History:  Procedure Laterality Date  . CARDIAC CATHETERIZATION  02/25/1996   No intervention-recommend CABG. Left main-80% concentric distal stenosis, LAD-99% ostial stenosis, first diag-80% segmental proximal stenosis, Circumflex-80% ostial stenosis, RCA-90% ostial stenosis.  Marland Kitchen CARDIAC CATHETERIZATION  05/21/2003   No intervention-widely patent grafts.  . CAROTID DOPPLER Bilateral 05/15/2010   Right Bulb/CEA-demonstrated trace irregular nonhemodynamically significant plaque 0-49%. Left Bulb/Proximal ICA-demonstrated irregular nonhemodynamically signifcant plaque 0-49%.  . CAROTID ENDARTERECTOMY    . CATARACT EXTRACTION    . COLONOSCOPY W/ POLYPECTOMY  06/2005  . CORONARY ARTERY BYPASS GRAFT  1997  . LEXISCAN MYOVIEW  04/03/2011   No scintigraphic evidence of inducible myocardial ischemia. No lexiscan  EKG changes. Non-diagnostic for ischemia.   . TRANSTHORACIC ECHOCARDIOGRAM  01/08/2011   EF 50-55%, LA moderately dilated, moderate tricuspid regurg, mild pulmonary hypertension.    Allergies  Allergen Reactions  . Codeine     unknown  .  Meperidine Hcl     unknown  . Oxycodone-Acetaminophen     unknown    Allergies as of 07/31/2016      Reactions   Codeine    unknown   Meperidine Hcl    unknown   Oxycodone-acetaminophen    unknown      Medication List       Accurate as of 07/31/16  3:35 PM. Always use your most recent med list.          acetaminophen 500 MG tablet Commonly known as:  TYLENOL Take 1 tablet (500 mg total) by mouth every 6 (six) hours as needed for mild pain. For pain   aspirin 81 MG chewable tablet Chew 81 mg by mouth daily.   atorvastatin 80 MG tablet Commonly known as:  LIPITOR TAKE 1 TABLET DAILY   bisoprolol 10 MG tablet Commonly known as:  ZEBETA TAKE 1 TABLET DAILY   CALCIUM 600-D 600-400 MG-UNIT tablet Generic drug:  Calcium Carbonate-Vitamin D Take 1 tablet by mouth daily.   clonazePAM 0.5 MG tablet Commonly known as:  KLONOPIN Take 1 tablet (0.5 mg total) by mouth 2 (two) times daily as needed for anxiety.   cyanocobalamin 500 MCG tablet Take 500 mcg by mouth daily.   diltiazem 120 MG 24 hr capsule Commonly known as:  TIAZAC Take 1 capsule (120 mg total) by mouth daily.   doxycycline 100 MG tablet Commonly known as:  VIBRA-TABS Take 100 mg by mouth 2 (two) times daily.   furosemide 20 MG tablet Commonly known as:  LASIX TAKE 1 TABLET DAILY (MUST SCHEDULE ANNUAL EXAM)   ipratropium-albuterol 0.5-2.5 (3) MG/3ML Soln Commonly known as:  DUONEB Take 3 mLs by nebulization every 6 (six) hours as needed.   levothyroxine 88 MCG tablet Commonly known as:  SYNTHROID, LEVOTHROID TAKE 1 TABLET DAILY   mirtazapine 45 MG tablet Commonly known as:  REMERON TAKE 1 TABLET AT BEDTIME   multivitamin capsule Take 1 capsule by mouth daily.   omeprazole 20 MG capsule Commonly known as:  PRILOSEC TAKE 1 CAPSULE DAILY   OXYGEN Inhale 2 L into the lungs at bedtime.   potassium chloride SA 20 MEQ tablet Commonly known as:  KLOR-CON M20 Take 1 tablet (20 mEq total) by  mouth daily.   QUEtiapine 25 MG tablet Commonly known as:  SEROQUEL TAKE 1 TABLET AT BEDTIME   ROBITUSSIN COUGH/CONGESTION PO Take 10 mLs by mouth every 8 (eight) hours as needed.   saccharomyces boulardii 250 MG capsule Commonly known as:  FLORASTOR Take 250 mg by mouth 2 (two) times daily.   sertraline 25 MG tablet Commonly known as:  ZOLOFT Take 1 tablet (25 mg total) by mouth daily.   UNABLE TO FIND Med Name: Med Pass 2.0 90 mL twice daily   vitamin E 400 UNIT capsule Take 400 Units by mouth daily.       Review of Systems  Constitutional: Positive for appetite change. Negative for activity change, chills, fatigue and fever.  HENT: Negative for congestion, rhinorrhea, sinus pain, sinus pressure, sneezing and sore throat.   Eyes: Negative.   Respiratory: Positive for cough. Negative for chest tightness, shortness of breath and wheezing.   Cardiovascular: Negative for chest pain, palpitations and leg  swelling.  Gastrointestinal: Negative for abdominal distention, abdominal pain, constipation, diarrhea, nausea and vomiting.  Endocrine: Negative.   Genitourinary: Negative for dysuria, flank pain, frequency and urgency.  Musculoskeletal: Positive for gait problem.  Skin: Negative.   Psychiatric/Behavioral: Negative for agitation, confusion, hallucinations and sleep disturbance. The patient is nervous/anxious.        Feels like giving up in life     Immunization History  Administered Date(s) Administered  . Influenza Split 02/13/2011  . Influenza Whole 04/20/2004, 02/16/2007, 01/20/2008, 01/29/2009, 01/08/2010  . Pneumococcal Polysaccharide-23 01/08/2010   Pertinent  Health Maintenance Due  Topic Date Due  . INFLUENZA VACCINE  05/22/2017 (Originally 11/18/2016)  . DEXA SCAN  06/02/2017 (Originally 09/03/1989)  . PNA vac Low Risk Adult (2 of 2 - PCV13) 06/02/2017 (Originally 01/09/2011)   Fall Risk  05/22/2016 06/10/2015 12/03/2014 03/01/2014 05/01/2013  Falls in the past  year? No No No No Yes  Number falls in past yr: - - - - 2 or more  Risk for fall due to : - - - - Impaired balance/gait;Impaired mobility       Vitals:   07/31/16 1533  BP: 132/72  Pulse: 79  Resp: 20  Temp: (!) 96.6 F (35.9 C)  SpO2: 98%  Weight: 95 lb 9.6 oz (43.4 kg)  Height:  (1.651 m)   Body mass index is 15.91 kg/m. Physical Exam  Constitutional: She is oriented to person, place, and time.  Thin frail elderly in no acute distress   HENT:  Head: Normocephalic.  Mouth/Throat: Oropharynx is clear and moist. No oropharyngeal exudate.  Eyes: Conjunctivae and EOM are normal. Pupils are equal, round, and reactive to light. Right eye exhibits no discharge. Left eye exhibits no discharge. No scleral icterus.  Neck: Normal range of motion. No JVD present. No thyromegaly present.  Cardiovascular: Normal rate, regular rhythm, normal heart sounds and intact distal pulses.  Exam reveals no gallop and no friction rub.   No murmur heard. Pulmonary/Chest: Effort normal. No respiratory distress. She has no wheezes.  Bilateral  lung lobes rales noted to auscultation   Abdominal: Soft. Bowel sounds are normal. She exhibits no distension. There is no tenderness. There is no rebound and no guarding.  Musculoskeletal: She exhibits no edema, tenderness or deformity.  Moves x 4 extremities unsteady gait   Lymphadenopathy:    She has no cervical adenopathy.  Neurological: She is oriented to person, place, and time.  Skin: Skin is warm and dry. No rash noted. No erythema. No pallor.  Psychiatric:  Depressed mood    Labs reviewed:  Recent Labs  07/15/16 2343  07/17/16 0328 07/18/16 0333 07/21/16 0519 07/27/16  NA  --   < > 140 137 140 141  K  --   < > 3.5 3.9 4.1 3.7  CL  --   < > 105 104 99*  --   CO2  --   < > 25 28 35*  --   GLUCOSE  --   < > 75 103* 121*  --   BUN  --   < > 17 20 21* 11  CREATININE  --   < > 0.82 0.73 0.64 0.8  CALCIUM  --   < > 8.0* 8.1* 8.3*  --   MG  1.9  --   --   --   --   --   < > = values in this interval not displayed.  Recent Labs  11/20/15 1630 05/22/16 1453 07/15/16 1627  AST 35 32 48*  ALT ALKPHOS 116 95 89  BILITOT 0.7 0.8 1.0  PROT 7.0 7.1 7.0  ALBUMIN 3.9 3.8 4.1    Recent Labs  07/15/16 1627 07/16/16 0317 07/17/16 0328 07/21/16 0519 07/27/16  WBC 4.3 6.6 8.7 6.2 6.6  NEUTROABS 3.2  --   --   --   --   HGB 13.0 13.2 12.3 12.0 13.6  HCT 38.9 41.3 37.8 37.3 42  MCV 91.5 95.6 96.2 95.2  --   PLT 80* 61* 69* 82* 168   Lab Results  Component Value Date   TSH 0.834 07/15/2016   Lab Results  Component Value Date   HGBA1C 6.0 (H) 01/23/2015   Lab Results  Component Value Date   CHOL 120 11/20/2015   HDL 58.90 11/20/2015   LDLCALC 51 11/20/2015   LDLDIRECT 84.4 10/07/2009   TRIG 53.0 11/20/2015   CHOLHDL 2 11/20/2015   Assessment/Plan  Depression, unspecified depression type Reports has good and bad days but would like to see her great grandchild soon to be born who will be named after her.she reports feeling like giving up. Patient's daughter at bedside request antidepressant to be started sooner.Recent TSH wnl. Will start on Zoloft 25 mg Tablet daily .Consult Psychiatry for follow up depression and anxiety.   CAP  Afebrile. Recent CXR showed right upper lobe infiltrate with small left effusion ( 07/28/2016).continue on doxycycline 100 mg Tablet twice daily x 10 days. Florastor 250 mg Tablet twice daily x 10 days for diarrhea prophylaxis.   Family/ staff Communication: Reviewed plan of care with patient and facility Nurse supervisor  Labs/tests ordered: None   Caesar Bookman, NP

## 2016-08-11 ENCOUNTER — Observation Stay (HOSPITAL_COMMUNITY)
Admission: EM | Admit: 2016-08-11 | Discharge: 2016-08-12 | Disposition: A | Discharge status: Home / Self Care | Payer: No Typology Code available for payment source | Attending: Internal Medicine | Admitting: Internal Medicine

## 2016-08-11 ENCOUNTER — Encounter (HOSPITAL_COMMUNITY): Payer: Self-pay | Admitting: *Deleted

## 2016-08-11 ENCOUNTER — Emergency Department (HOSPITAL_COMMUNITY): Payer: No Typology Code available for payment source

## 2016-08-11 DIAGNOSIS — Z79899 Other long term (current) drug therapy: Secondary | ICD-10-CM | POA: Insufficient documentation

## 2016-08-11 DIAGNOSIS — K644 Residual hemorrhoidal skin tags: Secondary | ICD-10-CM | POA: Insufficient documentation

## 2016-08-11 DIAGNOSIS — I272 Pulmonary hypertension, unspecified: Secondary | ICD-10-CM | POA: Insufficient documentation

## 2016-08-11 DIAGNOSIS — I4581 Long QT syndrome: Secondary | ICD-10-CM | POA: Insufficient documentation

## 2016-08-11 DIAGNOSIS — K449 Diaphragmatic hernia without obstruction or gangrene: Secondary | ICD-10-CM | POA: Diagnosis not present

## 2016-08-11 DIAGNOSIS — Z885 Allergy status to narcotic agent status: Secondary | ICD-10-CM | POA: Insufficient documentation

## 2016-08-11 DIAGNOSIS — Z888 Allergy status to other drugs, medicaments and biological substances status: Secondary | ICD-10-CM | POA: Insufficient documentation

## 2016-08-11 DIAGNOSIS — I25810 Atherosclerosis of coronary artery bypass graft(s) without angina pectoris: Secondary | ICD-10-CM | POA: Insufficient documentation

## 2016-08-11 DIAGNOSIS — G4700 Insomnia, unspecified: Secondary | ICD-10-CM | POA: Insufficient documentation

## 2016-08-11 DIAGNOSIS — F419 Anxiety disorder, unspecified: Secondary | ICD-10-CM | POA: Diagnosis not present

## 2016-08-11 DIAGNOSIS — K219 Gastro-esophageal reflux disease without esophagitis: Secondary | ICD-10-CM | POA: Insufficient documentation

## 2016-08-11 DIAGNOSIS — R079 Chest pain, unspecified: Secondary | ICD-10-CM

## 2016-08-11 DIAGNOSIS — E039 Hypothyroidism, unspecified: Secondary | ICD-10-CM | POA: Insufficient documentation

## 2016-08-11 DIAGNOSIS — R0789 Other chest pain: Secondary | ICD-10-CM | POA: Diagnosis not present

## 2016-08-11 DIAGNOSIS — E785 Hyperlipidemia, unspecified: Secondary | ICD-10-CM | POA: Insufficient documentation

## 2016-08-11 DIAGNOSIS — J90 Pleural effusion, not elsewhere classified: Secondary | ICD-10-CM | POA: Insufficient documentation

## 2016-08-11 DIAGNOSIS — Z8249 Family history of ischemic heart disease and other diseases of the circulatory system: Secondary | ICD-10-CM | POA: Insufficient documentation

## 2016-08-11 DIAGNOSIS — R55 Syncope and collapse: Secondary | ICD-10-CM | POA: Insufficient documentation

## 2016-08-11 DIAGNOSIS — I4891 Unspecified atrial fibrillation: Secondary | ICD-10-CM | POA: Insufficient documentation

## 2016-08-11 DIAGNOSIS — Z7982 Long term (current) use of aspirin: Secondary | ICD-10-CM | POA: Insufficient documentation

## 2016-08-11 DIAGNOSIS — Z86010 Personal history of colonic polyps: Secondary | ICD-10-CM | POA: Insufficient documentation

## 2016-08-11 DIAGNOSIS — I700 Atherosclerosis of aorta: Secondary | ICD-10-CM | POA: Insufficient documentation

## 2016-08-11 DIAGNOSIS — M545 Low back pain: Secondary | ICD-10-CM | POA: Insufficient documentation

## 2016-08-11 DIAGNOSIS — Z833 Family history of diabetes mellitus: Secondary | ICD-10-CM | POA: Insufficient documentation

## 2016-08-11 DIAGNOSIS — Z87891 Personal history of nicotine dependence: Secondary | ICD-10-CM | POA: Insufficient documentation

## 2016-08-11 DIAGNOSIS — K2270 Barrett's esophagus without dysplasia: Secondary | ICD-10-CM | POA: Insufficient documentation

## 2016-08-11 LAB — COMPREHENSIVE METABOLIC PANEL
ALK PHOS: 78 U/L (ref 38–126)
ALT: 20 U/L (ref 14–54)
AST: 40 U/L (ref 15–41)
Albumin: 3.1 g/dL — ABNORMAL LOW (ref 3.5–5.0)
Anion gap: 9 (ref 5–15)
BUN: 11 mg/dL (ref 6–20)
CALCIUM: 8.9 mg/dL (ref 8.9–10.3)
CHLORIDE: 99 mmol/L — AB (ref 101–111)
CO2: 27 mmol/L (ref 22–32)
CREATININE: 1.11 mg/dL — AB (ref 0.44–1.00)
GFR calc Af Amer: 49 mL/min — ABNORMAL LOW (ref >60)
GFR, EST NON AFRICAN AMERICAN: 42 mL/min — AB (ref >60)
Glucose, Bld: 104 mg/dL — ABNORMAL HIGH (ref 65–99)
Potassium: 4.3 mmol/L (ref 3.5–5.1)
SODIUM: 135 mmol/L (ref 135–145)
Total Bilirubin: 1.2 mg/dL (ref 0.3–1.2)
Total Protein: 6 g/dL — ABNORMAL LOW (ref 6.5–8.1)

## 2016-08-11 LAB — CBC WITH DIFFERENTIAL/PLATELET
Basophils Absolute: 0 10*3/uL (ref 0.0–0.1)
Basophils Relative: 0 %
EOS ABS: 0.1 10*3/uL (ref 0.0–0.7)
Eosinophils Relative: 1 %
HCT: 43.2 % (ref 36.0–46.0)
HEMOGLOBIN: 14.1 g/dL (ref 12.0–15.0)
LYMPHS ABS: 1.8 10*3/uL (ref 0.7–4.0)
Lymphocytes Relative: 23 %
MCH: 30.5 pg (ref 26.0–34.0)
MCHC: 32.6 g/dL (ref 30.0–36.0)
MCV: 93.3 fL (ref 78.0–100.0)
MONOS PCT: 8 %
Monocytes Absolute: 0.7 10*3/uL (ref 0.1–1.0)
Neutro Abs: 5.2 10*3/uL (ref 1.7–7.7)
Neutrophils Relative %: 68 %
PLATELETS: 142 10*3/uL — AB (ref 150–400)
RBC: 4.63 MIL/uL (ref 3.87–5.11)
RDW: 14.6 % (ref 11.5–15.5)
WBC: 7.7 10*3/uL (ref 4.0–10.5)

## 2016-08-11 LAB — I-STAT TROPONIN, ED
Troponin i, poc: 0.01 ng/mL (ref 0.00–0.08)
Troponin i, poc: 0.01 ng/mL (ref 0.00–0.08)

## 2016-08-11 MED ORDER — QUETIAPINE FUMARATE 25 MG PO TABS
25.0000 mg | ORAL_TABLET | Freq: Every day | ORAL | Status: DC
  Administered 2016-08-12: 25 mg via ORAL
  Filled 2016-08-11: qty 1

## 2016-08-11 MED ORDER — PANTOPRAZOLE SODIUM 40 MG PO TBEC
40.0000 mg | DELAYED_RELEASE_TABLET | Freq: Every day | ORAL | Status: DC
  Administered 2016-08-12: 40 mg via ORAL
  Filled 2016-08-11: qty 1

## 2016-08-11 MED ORDER — NITROGLYCERIN 2 % TD OINT
1.0000 [in_us] | TOPICAL_OINTMENT | Freq: Four times a day (QID) | TRANSDERMAL | Status: DC
Start: 2016-08-11 — End: 2016-08-11

## 2016-08-11 MED ORDER — SERTRALINE HCL 25 MG PO TABS
25.0000 mg | ORAL_TABLET | Freq: Every day | ORAL | Status: DC
  Administered 2016-08-12: 25 mg via ORAL
  Filled 2016-08-11: qty 1

## 2016-08-11 MED ORDER — ADULT MULTIVITAMIN W/MINERALS CH
1.0000 | ORAL_TABLET | Freq: Every day | ORAL | Status: DC
  Administered 2016-08-12: 1 via ORAL
  Filled 2016-08-11: qty 1

## 2016-08-11 MED ORDER — ACETAMINOPHEN 500 MG PO TABS
1000.0000 mg | ORAL_TABLET | Freq: Once | ORAL | Status: AC
Start: 2016-08-11 — End: 2016-08-11
  Administered 2016-08-11: 1000 mg via ORAL
  Filled 2016-08-11: qty 2

## 2016-08-11 MED ORDER — MIRTAZAPINE 15 MG PO TABS
45.0000 mg | ORAL_TABLET | Freq: Every day | ORAL | Status: DC
  Administered 2016-08-12: 45 mg via ORAL
  Filled 2016-08-11: qty 3

## 2016-08-11 MED ORDER — VITAMIN E 180 MG (400 UNIT) PO CAPS
400.0000 [IU] | ORAL_CAPSULE | Freq: Every day | ORAL | Status: DC
  Administered 2016-08-12: 400 [IU] via ORAL
  Filled 2016-08-11: qty 1

## 2016-08-11 MED ORDER — VITAMIN B-12 1000 MCG PO TABS
500.0000 ug | ORAL_TABLET | Freq: Every day | ORAL | Status: DC
  Administered 2016-08-12: 500 ug via ORAL
  Filled 2016-08-11: qty 1

## 2016-08-11 MED ORDER — ASPIRIN 81 MG PO CHEW
81.0000 mg | CHEWABLE_TABLET | Freq: Every day | ORAL | Status: DC
  Administered 2016-08-12: 81 mg via ORAL
  Filled 2016-08-11: qty 1

## 2016-08-11 MED ORDER — BISOPROLOL FUMARATE 5 MG PO TABS
10.0000 mg | ORAL_TABLET | Freq: Every day | ORAL | Status: DC
  Administered 2016-08-12: 10 mg via ORAL
  Filled 2016-08-11: qty 2

## 2016-08-11 MED ORDER — POTASSIUM CHLORIDE CRYS ER 20 MEQ PO TBCR
20.0000 meq | EXTENDED_RELEASE_TABLET | Freq: Every day | ORAL | Status: DC
  Administered 2016-08-12: 20 meq via ORAL
  Filled 2016-08-11: qty 1

## 2016-08-11 MED ORDER — ONDANSETRON HCL 4 MG/2ML IJ SOLN: 4.0000 mg | Freq: Four times a day (QID) | INTRAMUSCULAR | Status: DC | PRN

## 2016-08-11 MED ORDER — NITROGLYCERIN 0.4 MG SL SUBL: 0.4000 mg | SUBLINGUAL_TABLET | SUBLINGUAL | Status: DC | PRN

## 2016-08-11 MED ORDER — CALCIUM CARBONATE-VITAMIN D 500-200 MG-UNIT PO TABS
1.0000 | ORAL_TABLET | Freq: Every day | ORAL | Status: DC
  Administered 2016-08-12: 1 via ORAL
  Filled 2016-08-11: qty 1

## 2016-08-11 MED ORDER — ACETAMINOPHEN 500 MG PO TABS: 500.0000 mg | ORAL_TABLET | Freq: Four times a day (QID) | ORAL | Status: DC | PRN

## 2016-08-11 MED ORDER — FUROSEMIDE 20 MG PO TABS
20.0000 mg | ORAL_TABLET | Freq: Every day | ORAL | Status: DC
Start: 2016-08-12 — End: 2016-08-12
  Administered 2016-08-12: 20 mg via ORAL
  Filled 2016-08-11: qty 1

## 2016-08-11 MED ORDER — ATORVASTATIN CALCIUM 80 MG PO TABS
80.0000 mg | ORAL_TABLET | Freq: Every day | ORAL | Status: DC
  Administered 2016-08-12: 80 mg via ORAL
  Filled 2016-08-11: qty 1

## 2016-08-11 MED ORDER — SODIUM CHLORIDE 0.9 % IV SOLN
Freq: Once | INTRAVENOUS | Status: AC
  Administered 2016-08-11: 21:00:00 via INTRAVENOUS

## 2016-08-11 MED ORDER — IPRATROPIUM-ALBUTEROL 0.5-2.5 (3) MG/3ML IN SOLN: 3.0000 mL | Freq: Four times a day (QID) | RESPIRATORY_TRACT | Status: DC | PRN

## 2016-08-11 MED ORDER — LEVOTHYROXINE SODIUM 88 MCG PO TABS
88.0000 ug | ORAL_TABLET | Freq: Every day | ORAL | Status: DC
Start: 2016-08-12 — End: 2016-08-12
  Administered 2016-08-12: 88 ug via ORAL
  Filled 2016-08-11: qty 1

## 2016-08-11 MED ORDER — DILTIAZEM HCL ER COATED BEADS 120 MG PO CP24
120.0000 mg | ORAL_CAPSULE | Freq: Every day | ORAL | Status: DC
  Administered 2016-08-12: 120 mg via ORAL
  Filled 2016-08-11: qty 1

## 2016-08-11 NOTE — ED Notes (Signed)
Pt assisted to bedside commode with this RN, moderate assist. Unsteady gait. This RN remained at bedside during toileting and pt remained on heart monitor.

## 2016-08-11 NOTE — ED Notes (Addendum)
Pt reports being "hot and sweaty" temperature rechecked and noted to have dec since admin of tylenol.

## 2016-08-11 NOTE — ED Provider Notes (Signed)
[facility]-EMERGENCY DEPT Provider Note   CSN: [id number] Arrival date & time: 08/11/16  1323     History   Chief Complaint Chief Complaint  Patient presents with  . Chest Pain    HPI Jasmine [name] is a 83 y.o. female who presents to the Emergency Department from [facility] for chest pain. She reports an episode of central, non-radiating CP that began at approximately 12:30 PM this afternoon while she was at rest with associated dyspnea. Her daughter reports she received a call from [facility] about the episode at 13:00. She denies aggravating and alleviating factors. She reports the pain has continued to come and go, but she is somewhat confused about the details. Her daughter reports increased forgetfulness over the last year. Denies N/V, numbness, or tingling.  EMS reports she was given  324 mg of ASA and 3 nitro en route to the ED.  Recent hospital admission at [facility] for influenza, pneumonia, and strep throat on 4/3.   PMH includes Afib with RVR w/ no current anticoagulation d/t fall risk, CAD s/op CABG '97. Catheretization in 12/05 w/ patient grafts w/ normal LV function. Stress test 12/12 was unremarkable.   The history is provided by the patient, the nursing home and the EMS personnel. No language interpreter was used.    Past Medical History:  Diagnosis Date  . Anxiety   . Atrial fibrillation with RVR (HCC)   . Barrett's esophagus   . CAD (coronary artery disease) of artery bypass graft 1995  . Fracture of right shoulder 02/2008  . GERD (gastroesophageal reflux disease)   . Hemorrhoids, external   . Hiatal hernia   . History of colon polyps   . Hyperlipidemia   . Hypertension   . Hypothyroidism   . Insomnia due to medical condition   . Left wrist fracture 09/2007  . Low back pain   . Syncope     Patient Active Problem List   Diagnosis Date Noted  . Chronic respiratory failure with hypoxia (HCC) 08/13/2016  . Depression with anxiety 08/13/2016  . Chest pain  08/11/2016  . Influenza 07/16/2016  . Acute respiratory failure with hypoxemia (HCC) 07/16/2016  . Protein-calorie malnutrition, severe 07/16/2016  . Sepsis (HCC) 07/15/2016  . DVT (deep venous thrombosis) (HCC) 07/15/2016  . COPD with acute exacerbation (HCC) 07/15/2016  . Constipation 11/20/2015  . Thoracic back pain 11/20/2015  . Thrombocytopenia (HCC) 01/23/2015  . Chronic diastolic CHF (congestive heart failure) (HCC) 01/22/2015  . Carotid artery disease (HCC) 03/22/2014  . CAD - CABG '97, cath OK 2005, Myoview low risk 12/12 01/06/2013  . HTN (hypertension) 01/06/2013  . Chronic atrial fibrillation (HCC) 01/06/2013  . BARRETT'S ESOPHAGUS 06/13/2008  . INSOMNIA, CHRONIC 01/17/2007  . Hypothyroidism 01/12/2007  . HLD (hyperlipidemia) 01/12/2007  . Anxiety state 01/12/2007    Past Surgical History:  Procedure Laterality Date  . CARDIAC CATHETERIZATION  02/25/1996   No intervention-recommend CABG. Left main-80% concentric distal stenosis, LAD-99% ostial stenosis, first diag-80% segmental proximal stenosis, Circumflex-80% ostial stenosis, RCA-90% ostial stenosis.  Marland Kitchen CARDIAC CATHETERIZATION  05/21/2003   No intervention-widely patent grafts.  . CAROTID DOPPLER Bilateral 05/15/2010   Right Bulb/CEA-demonstrated trace irregular nonhemodynamically significant plaque 0-49%. Left Bulb/Proximal ICA-demonstrated irregular nonhemodynamically signifcant plaque 0-49%.  . CAROTID ENDARTERECTOMY    . CATARACT EXTRACTION    . COLONOSCOPY W/ POLYPECTOMY  06/2005  . CORONARY ARTERY BYPASS GRAFT  1997  . LEXISCAN MYOVIEW  04/03/2011   No scintigraphic evidence of inducible myocardial ischemia. No  lexiscan EKG changes. Non-diagnostic for ischemia.   . TRANSTHORACIC ECHOCARDIOGRAM  01/08/2011   EF 50-55%, LA moderately dilated, moderate tricuspid regurg, mild pulmonary hypertension.    OB History    No data available       Home Medications    Prior to Admission medications   Medication Sig  Start Date End Date Taking? Authorizing Provider  acetaminophen (TYLENOL) 500 MG tablet Take 1 tablet (500 mg total) by mouth every 6 (six) hours as needed for mild pain. For pain 04/10/15  Yes Belkys A Regalado, MD  aspirin 81 MG chewable tablet Chew 81 mg by mouth daily.   Yes Historical Provider, MD  atorvastatin (LIPITOR) 80 MG tablet TAKE 1 TABLET DAILY 07/15/16  Yes Lorre Munroe, NP  bisoprolol (ZEBETA) 10 MG tablet TAKE 1 TABLET DAILY 07/06/16  Yes Lorre Munroe, NP  Calcium Carbonate-Vitamin D (CALCIUM 600-D) 600-400 MG-UNIT per tablet Take 1 tablet by mouth daily.    Yes Historical Provider, MD  cyanocobalamin 500 MCG tablet Take 500 mcg by mouth daily.     Yes Historical Provider, MD  Dextromethorphan-Guaifenesin (ROBITUSSIN COUGH/CONGESTION PO) Take 10 mLs by mouth every 8 (eight) hours as needed (for cough/congestion).    Yes Historical Provider, MD  diltiazem (TIAZAC) 120 MG 24 hr capsule Take 1 capsule (120 mg total) by mouth daily. 03/30/16  Yes Runell Gess, MD  furosemide (LASIX) 20 MG tablet TAKE 1 TABLET DAILY (MUST SCHEDULE ANNUAL EXAM) 04/29/16  Yes Lorre Munroe, NP  ipratropium-albuterol (DUONEB) 0.5-2.5 (3) MG/3ML SOLN Take 3 mLs by nebulization every 6 (six) hours as needed. 04/10/15  Yes Belkys A Regalado, MD  levothyroxine (SYNTHROID, LEVOTHROID) 88 MCG tablet TAKE 1 TABLET DAILY 06/15/16  Yes Lorre Munroe, NP  mirtazapine (REMERON) 45 MG tablet TAKE 1 TABLET AT BEDTIME 06/29/16  Yes Lorre Munroe, NP  Multiple Vitamin (MULTIVITAMIN) capsule Take 1 capsule by mouth daily.     Yes Historical Provider, MD  omeprazole (PRILOSEC) 20 MG capsule TAKE 1 CAPSULE DAILY 12/10/15  Yes Lorre Munroe, NP  OXYGEN Inhale 2 L into the lungs at bedtime.   Yes Historical Provider, MD  potassium chloride SA (KLOR-CON M20) 20 MEQ tablet Take 1 tablet (20 mEq total) by mouth daily. 04/08/16  Yes Lorre Munroe, NP  QUEtiapine (SEROQUEL) 25 MG tablet TAKE 1 TABLET AT BEDTIME 07/08/16  Yes  Lorre Munroe, NP  sertraline (ZOLOFT) 25 MG tablet Take 1 tablet (25 mg total) by mouth daily. 07/31/16  Yes Dinah C Ngetich, NP  vitamin E 400 UNIT capsule Take 400 Units by mouth daily.     Yes Historical Provider, MD  clonazePAM (KLONOPIN) 0.5 MG tablet Take 1 tablet (0.5 mg total) by mouth 2 (two) times daily as needed for anxiety. 07/21/16   Costin Otelia Sergeant, MD  nitroGLYCERIN (NITROSTAT) 0.4 MG SL tablet Place 1 tablet (0.4 mg total) under the tongue every 5 (five) minutes x 3 doses as needed for chest pain. 08/12/16   Berton Bon, NP  UNABLE TO FIND Med Name: Med Pass 2.0 90 mL twice daily    Historical Provider, MD    Family History Family History  Problem Relation Age of Onset  . Heart disease Mother   . Heart disease Father   . Diabetes Son     Social History Social History  Substance Use Topics  . Smoking status: Former Smoker    Quit date: 04/20/1968  . Smokeless tobacco: Never Used  Comment: QUIT IN 75  . Alcohol use No     Comment: STOPPED IN 1970     Allergies   Codeine; Meperidine hcl; and Oxycodone-acetaminophen   Review of Systems Review of Systems  Constitutional: Negative for chills and fever.  HENT: Negative for congestion.   Eyes: Negative for visual disturbance.  Respiratory: Positive for shortness of breath.   Cardiovascular: Positive for chest pain.  Gastrointestinal: Negative for diarrhea, nausea and vomiting.  Genitourinary: Negative for dysuria.  Musculoskeletal: Negative for back pain.  Skin: Negative for rash and wound.  Allergic/Immunologic: Negative for immunocompromised state.  Neurological: Negative for numbness.  Psychiatric/Behavioral: Positive for confusion.    Physical Exam Updated Vital Signs BP (!) 102/55 (BP Location: Right Arm)   Pulse 81   Temp 97.6 F (36.4 C) (Oral)   Resp 20   Ht  (1.651 m)   Wt 41.3 kg   SpO2 96%   BMI 15.14 kg/m   Physical Exam  Constitutional: No distress.  Frail-appearing.    HENT:  Head: Normocephalic and atraumatic.  Eyes: Conjunctivae are normal.  Neck: Neck supple.  Cardiovascular: Normal rate.  An irregularly irregular rhythm present. Exam reveals no gallop and no friction rub.   No murmur heard. Pulmonary/Chest: Effort normal and breath sounds normal. No respiratory distress. She has no wheezes. She has no rales.  Abdominal: Soft. She exhibits no distension. There is no tenderness. There is no guarding.  Musculoskeletal: Normal range of motion. She exhibits no edema, tenderness or deformity.  Neurological: She is alert.  Skin: Skin is warm and dry. No rash noted. She is not diaphoretic.  Psychiatric: Her behavior is normal.  Nursing note and vitals reviewed.  ED Treatments / Results  Labs (all labs ordered are listed, but only abnormal results are displayed) Labs Reviewed  CBC WITH DIFFERENTIAL/PLATELET - Abnormal; Notable for the following:       Result Value   Platelets 142 (*)    All other components within normal limits  COMPREHENSIVE METABOLIC PANEL - Abnormal; Notable for the following:    Chloride 99 (*)    Glucose, Bld 104 (*)    Creatinine, Ser 1.11 (*)    Total Protein 6.0 (*)    Albumin 3.1 (*)    GFR calc non Af Amer 42 (*)    GFR calc Af Amer 49 (*)    All other components within normal limits  TROPONIN I - Abnormal; Notable for the following:    Troponin I 0.03 (*)    All other components within normal limits  TROPONIN I  TROPONIN I  TSH  I-STAT TROPOININ, ED  I-STAT TROPOININ, ED    EKG  EKG Interpretation  Date/Time:  Tuesday August 11 2016 13:29:00 EDT Ventricular Rate:  97 PR Interval:    QRS Duration: 71 QT Interval:  458 QTC Calculation: 582 R Axis:   23 Text Interpretation:  Atrial fibrillation Borderline abnrm T, anterolateral leads Prolonged QT interval No significant change since last tracing Confirmed by Ethelda Chick  MD, SAM [phone]) on 08/11/2016 2:20:05 PM       Radiology No results  found.  Procedures Procedures (including critical care time)  Medications Ordered in ED Medications  0.9 %  sodium chloride infusion ( Intravenous Stopped 08/11/16 2351)  acetaminophen (TYLENOL) tablet 1,000 mg (1,000 mg Oral Given 08/11/16 2218)     Initial Impression / Assessment and Plan / ED Course  I have reviewed the triage vital signs and the nursing notes.  Pertinent labs & imaging results that were available during my care of the patient were reviewed by me and considered in my medical decision making (see chart for details).     83 year-old female with with a h/o of CAD s/p CABG in '97 presents and Afib with an acute episode of central chest pain this afternoon at [facility]. The patient is somewhat confused about the episode, but reports that she was initially pain free in ED, but the pain has been coming and going multiple times since arrival. Initial troponin negative. EKG with no change from prior. Moderate HEART score based on risk factors and age alone. Repeat troponin ordered and pending. Cardiology has been consulted, but has not seen the patient at this time. Will plan to admit the patient for observation. Will sign out the patient at shift change to [facility], PA-C, for remaining workup.   Final Clinical Impressions(s) / ED Diagnoses   Final diagnoses:  Chest pain, unspecified type      Otisha Spickler A Airika Alkhatib, PA-C 08/13/16 2255    Doug Sou, MD 08/14/16 1818

## 2016-08-11 NOTE — ED Triage Notes (Signed)
Pt arrives via [facility] from [facility]. Pt states she began having centralized CP with no other associated sx or radiation. [facility]. Pt states she is currently in no pain.

## 2016-08-11 NOTE — H&P (Signed)
Patient ID: Jasmine [name] MRN: [id number], DOB/AGE: 07-05-1922   Admit date: 08/11/2016   Primary Physician: Nicki Reaper, NP  Pt. Profile: 83 y.o. female w/ history of CAD (s/p CABG), HFpEF, Afib (not on AC) and HTN who presents after episode of chest pain.  Patient is very pleasant, but slightly confused about the details. Possibly sun-downing but does reports being forgetful over the last several months.  Reports are that patient developed chest pain around 12:30pm on 4/24 while lying in bed. Ate breakfast and was spending day in bed watching TV when she developed sharp/stabbing pain over MEG/substernum.   She cannot remember what made it improve or how long it lasted. Denies n/v or diaphoresis associated.   Recently admitted to hospital for sepsis (2/2 PNA + flu) on 07/21/2016.   Problem List  Past Medical History:  Diagnosis Date  . Anxiety   . Atrial fibrillation with RVR (HCC)   . Barrett's esophagus   . CAD (coronary artery disease) of artery bypass graft 1995  . Fracture of right shoulder 02/2008  . GERD (gastroesophageal reflux disease)   . Hemorrhoids, external   . Hiatal hernia   . History of colon polyps   . Hyperlipidemia   . Hypertension   . Hypothyroidism   . Insomnia due to medical condition   . Left wrist fracture 09/2007  . Low back pain   . Syncope     Past Surgical History:  Procedure Laterality Date  . CARDIAC CATHETERIZATION  02/25/1996   No intervention-recommend CABG. Left main-80% concentric distal stenosis, LAD-99% ostial stenosis, first diag-80% segmental proximal stenosis, Circumflex-80% ostial stenosis, RCA-90% ostial stenosis.  Marland Kitchen CARDIAC CATHETERIZATION  05/21/2003   No intervention-widely patent grafts.  . CAROTID DOPPLER Bilateral 05/15/2010   Right Bulb/CEA-demonstrated trace irregular nonhemodynamically significant plaque 0-49%. Left Bulb/Proximal ICA-demonstrated irregular nonhemodynamically signifcant plaque 0-49%.  . CAROTID  ENDARTERECTOMY    . CATARACT EXTRACTION    . COLONOSCOPY W/ POLYPECTOMY  06/2005  . CORONARY ARTERY BYPASS GRAFT  1997  . LEXISCAN MYOVIEW  04/03/2011   No scintigraphic evidence of inducible myocardial ischemia. No lexiscan EKG changes. Non-diagnostic for ischemia.   . TRANSTHORACIC ECHOCARDIOGRAM  01/08/2011   EF 50-55%, LA moderately dilated, moderate tricuspid regurg, mild pulmonary hypertension.     Allergies  Allergies  Allergen Reactions  . Codeine     unknown  . Meperidine Hcl     unknown  . Oxycodone-Acetaminophen     unknown    Home Medications  Prior to Admission medications   Medication Sig Start Date End Date Taking? Authorizing Provider  acetaminophen (TYLENOL) 500 MG tablet Take 1 tablet (500 mg total) by mouth every 6 (six) hours as needed for mild pain. For pain 04/10/15   Belkys A Regalado, MD  aspirin 81 MG chewable tablet Chew 81 mg by mouth daily.    Historical Provider, MD  atorvastatin (LIPITOR) 80 MG tablet TAKE 1 TABLET DAILY 07/15/16   Lorre Munroe, NP  bisoprolol (ZEBETA) 10 MG tablet TAKE 1 TABLET DAILY 07/06/16   Lorre Munroe, NP  Calcium Carbonate-Vitamin D (CALCIUM 600-D) 600-400 MG-UNIT per tablet Take 1 tablet by mouth daily.     Historical Provider, MD  clonazePAM (KLONOPIN) 0.5 MG tablet Take 1 tablet (0.5 mg total) by mouth 2 (two) times daily as needed for anxiety. 07/21/16   Costin Otelia Sergeant, MD  cyanocobalamin 500 MCG tablet Take 500 mcg by mouth daily.      Historical Provider,  MD  Dextromethorphan-Guaifenesin (ROBITUSSIN COUGH/CONGESTION PO) Take 10 mLs by mouth every 8 (eight) hours as needed.    Historical Provider, MD  diltiazem (TIAZAC) 120 MG 24 hr capsule Take 1 capsule (120 mg total) by mouth daily. 03/30/16   Runell Gess, MD  furosemide (LASIX) 20 MG tablet TAKE 1 TABLET DAILY (MUST SCHEDULE ANNUAL EXAM) 04/29/16   Lorre Munroe, NP  ipratropium-albuterol (DUONEB) 0.5-2.5 (3) MG/3ML SOLN Take 3 mLs by nebulization every 6 (six)  hours as needed. 04/10/15   Belkys A Regalado, MD  levothyroxine (SYNTHROID, LEVOTHROID) 88 MCG tablet TAKE 1 TABLET DAILY 06/15/16   Lorre Munroe, NP  mirtazapine (REMERON) 45 MG tablet TAKE 1 TABLET AT BEDTIME 06/29/16   Lorre Munroe, NP  Multiple Vitamin (MULTIVITAMIN) capsule Take 1 capsule by mouth daily.      Historical Provider, MD  omeprazole (PRILOSEC) 20 MG capsule TAKE 1 CAPSULE DAILY 12/10/15   Lorre Munroe, NP  OXYGEN Inhale 2 L into the lungs at bedtime.    Historical Provider, MD  potassium chloride SA (KLOR-CON M20) 20 MEQ tablet Take 1 tablet (20 mEq total) by mouth daily. 04/08/16   Lorre Munroe, NP  QUEtiapine (SEROQUEL) 25 MG tablet TAKE 1 TABLET AT BEDTIME 07/08/16   Lorre Munroe, NP  sertraline (ZOLOFT) 25 MG tablet Take 1 tablet (25 mg total) by mouth daily. 07/31/16   Dinah C Ngetich, NP  UNABLE TO FIND Med Name: Med Pass 2.0 90 mL twice daily    Historical Provider, MD  vitamin E 400 UNIT capsule Take 400 Units by mouth daily.      Historical Provider, MD    Family History  Family History  Problem Relation Age of Onset  . Heart disease Mother   . Heart disease Father   . Diabetes Son     Social History  Social History   Social History  . Marital status: Widowed    Spouse name: N/A  . Number of children: N/A  . Years of education: N/A   Occupational History  . Not on file.   Social History Main Topics  . Smoking status: Former Smoker    Quit date: 04/20/1968  . Smokeless tobacco: Never Used     Comment: QUIT IN 1970  . Alcohol use No     Comment: STOPPED IN 1970  . Drug use: No  . Sexual activity: Not Currently   Other Topics Concern  . Not on file   Social History Narrative  . No narrative on file     Review of Systems General:  No chills, fever, night sweats or weight changes.  Cardiovascular:  +chest pain (non currently), no dyspnea on exertion, edema, orthopnea, palpitations, paroxysmal nocturnal dyspnea. Dermatological: No rash,  lesions/masses Respiratory: No cough, dyspnea Urologic: No hematuria, dysuria Abdominal:   No nausea, vomiting, diarrhea, bright red blood per rectum, melena, or hematemesis Neurologic:  No visual changes, wkns, changes in mental status. All other systems reviewed and are otherwise negative except as noted above.  Physical Exam  Blood pressure 125/83, pulse 79, temperature 100 F (37.8 C), temperature source Oral, resp. rate (!) 28, SpO2 95 %.  General: Pleasant, NAD Psych: Normal affect. Neuro: Alert and oriented X 2 (date is 1929), Moves all extremities spontaneously. HEENT: Normal  Neck: Supple without bruits or JVD. Lungs:  Resp regular and unlabored, CTA. Heart: RRR no s3, s4, or murmurs. Abdomen: Soft, non-tender, non-distended, BS + x 4.  Extremities: No  clubbing, cyanosis or edema. DP/PT/Radials 2+ and equal bilaterally.  Labs  Troponin [facility] of Care Test)  Recent Labs  08/11/16 2039  TROPIPOC 0.01   No results for input(s): CKTOTAL, CKMB, TROPONINI in the last 72 hours. Lab Results  Component Value Date   WBC 7.7 08/11/2016   HGB 14.1 08/11/2016   HCT 43.2 08/11/2016   MCV 93.3 08/11/2016   PLT 142 (L) 08/11/2016    Recent Labs Lab 08/11/16 1335  NA 135  K 4.3  CL 99*  CO2 27  BUN 11  CREATININE 1.11*  CALCIUM 8.9  PROT 6.0*  BILITOT 1.2  ALKPHOS 78  ALT 20  AST 40  GLUCOSE 104*   Lab Results  Component Value Date   CHOL 120 11/20/2015   HDL 58.90 11/20/2015   LDLCALC 51 11/20/2015   TRIG 53.0 11/20/2015   Lab Results  Component Value Date   DDIMER (H) 04/03/2008    0.76        AT THE INHOUSE ESTABLISHED CUTOFF VALUE OF 0.48 ug/mL FEU, THIS ASSAY HAS BEEN DOCUMENTED IN THE LITERATURE TO HAVE     Radiology/Studies  Dg Chest 2 View  Result Date: 08/11/2016 CLINICAL DATA:  Chest pain today.  History of hypertension. EXAM: CHEST  2 VIEW COMPARISON:  07/07/2016 FINDINGS: Changes from CABG surgery are stable. Cardiac silhouette is  top-normal in size. No mediastinal or hilar masses. No evidence of adenopathy. There are prominent bronchovascular markings similar to the prior exam. No evidence of pneumonia or pulmonary edema. Small left pleural effusion similar to the prior study. No right pleural effusion. No pneumothorax. Skeletal structures are demineralized but grossly intact. IMPRESSION: No acute cardiopulmonary disease. Electronically Signed   By: Amie Portland M.D.   On: 08/11/2016 14:56   Dg Chest 2 View  Result Date: 07/15/2016 CLINICAL DATA:  Cough for 3-4 days, headache for 2-3 days, atrial fibrillation, coronary artery disease, hypertension, GERD, former smoker EXAM: CHEST  2 VIEW COMPARISON:  09/09/2015 FINDINGS: Normal heart size post CABG. Calcified tortuous aorta. Pulmonary vascularity normal. Bronchitic changes with chronic accentuation of RIGHT upper lobe markings, little changed. Persistent blunting of the LEFT lateral costophrenic angle and mild LEFT basilar atelectasis, with associated small LEFT pleural effusion. Minimal RIGHT basilar atelectasis. No definite acute infiltrate or pneumothorax. Bones diffusely demineralized. IMPRESSION: Post CABG. Aortic atherosclerosis. LEFT pleural effusion with bibasilar atelectasis greater on LEFT. Underlying emphysematous changes with chronic increase in RIGHT upper lobe markings, may represent scarring, unable to exclude recurrent infiltrate. Electronically Signed   By: Ulyses Southward M.D.   On: 07/15/2016 17:48   Ct Head Wo Contrast  Result Date: 07/16/2016 CLINICAL DATA:  Altered mental status, lethargy, hypertension, coronary artery disease, atrial fibrillation EXAM: CT HEAD WITHOUT CONTRAST TECHNIQUE: Contiguous axial images were obtained from the base of the skull through the vertex without intravenous contrast. COMPARISON:  07/15/2016 FINDINGS: Brain: Generalized atrophy. Normal ventricular morphology. No midline shift or mass effect. Question new lacunar infarct at the LEFT  external capsule. Question additional lacunar infarct at LEFT thalamus. Small vessel chronic ischemic changes of deep cerebral white matter. No acute cortical infarction identified. No intracranial hemorrhage, mass lesion, or extra-axial fluid collection. Vascular: Atherosclerotic calcification of the internal carotid and vertebral arteries at the skullbase. Skull: Diffuse osseous demineralization Sinuses/Orbits: Cleared Other: N/A IMPRESSION: Atrophy with small vessel chronic ischemic changes of deep cerebral white matter. Questionable lacunar infarcts at the LEFT thalamus and LEFT external capsule. Electronically Signed   By: Loraine Leriche  Tyron Russell M.D.   On: 07/16/2016 17:16   Ct Head Wo Contrast  Result Date: 07/15/2016 CLINICAL DATA:  Cough and headache x3 days. EXAM: CT HEAD WITHOUT CONTRAST TECHNIQUE: Contiguous axial images were obtained from the base of the skull through the vertex without intravenous contrast. COMPARISON:  01/22/2015 CT FINDINGS: BRAIN: There is sulcal prominence consistent with superficial atrophy similar to prior exam. No intraparenchymal hemorrhage, mass effect nor midline shift. Periventricular and subcortical white matter hypodensities consistent with chronic mild small vessel ischemic disease are identified. No acute large vascular territory infarcts. No abnormal extra-axial fluid collections. Basal cisterns are not effaced and midline. VASCULAR: Moderate calcific atherosclerosis of the carotid siphons and both vertebral arteries. SKULL: No skull fracture. No significant scalp soft tissue swelling. SINUSES/ORBITS: The mastoid air-cells are clear. The included paranasal sinuses are well-aerated.The included ocular globes and orbital contents are non-suspicious. OTHER: None. IMPRESSION: Mild superficial atrophy with chronic small vessel ischemic disease periventricular white matter. No acute intracranial abnormality noted. Electronically Signed   By: Tollie Eth M.D.   On: 07/15/2016 20:07     ECG Afib  Echocardiogram  01/2015 TTE EF normal, RVSP 55, +PFO   ====================== ASSESSMENT AND PLAN 83 y.o. female w/ history of CAD (s/p CABG), HFpEF, Afib (not on AC) and HTN who presents after episode of chest pain  # Atypical chest pain: h/o of CAD and afib. Last LHC was 2005 (patent grafts). Stress test negative in 2012. No evidence of current ACS. Denies any active chest pain.  - continue her medications (including BB) - trend enzymes - might consider medical management of CAD and also GERD vs. stress testing  - re-evaluate symptoms during day time (to decrease possible sun-downing effect during interview)  DVT ppx: heparin Code: full upon questioning in ED  Signed, Margo Aye, MD 08/11/2016, 10:21 PM

## 2016-08-12 DIAGNOSIS — R079 Chest pain, unspecified: Secondary | ICD-10-CM | POA: Diagnosis not present

## 2016-08-12 LAB — TSH: TSH: 0.733 u[IU]/mL (ref 0.350–4.500)

## 2016-08-12 LAB — TROPONIN I
Troponin I: <0.03 ng/mL (ref <0.03)
Troponin I: <0.03 ng/mL (ref <0.03)
Troponin I: 0.03 ng/mL (ref <0.03)

## 2016-08-12 MED ORDER — HEPARIN SODIUM (PORCINE) 5000 UNIT/ML IJ SOLN
5000.0000 [IU] | Freq: Two times a day (BID) | INTRAMUSCULAR | Status: DC
  Administered 2016-08-12: 5000 [IU] via SUBCUTANEOUS
  Filled 2016-08-12: qty 1

## 2016-08-12 MED ORDER — NITROGLYCERIN 0.4 MG SL SUBL: 0.4000 mg | SUBLINGUAL_TABLET | SUBLINGUAL | 12 refills | Status: AC | PRN

## 2016-08-12 NOTE — Progress Notes (Signed)
Progress Note  Patient Name: Jasmine [name] Date of Encounter: 08/12/2016  Primary Cardiologist: Dr. Allyson Sabal  Subjective   No chest pain.  Breathing OK.  Ambulated in the hallway.   Inpatient Medications    Scheduled Meds: . aspirin  81 mg Oral Daily  . atorvastatin  80 mg Oral Daily  . bisoprolol  10 mg Oral Daily  . calcium-vitamin D  1 tablet Oral Daily  . diltiazem  120 mg Oral Daily  . furosemide  20 mg Oral Daily  . heparin  5,000 Units Subcutaneous BID  . levothyroxine  88 mcg Oral QAC breakfast  . mirtazapine  45 mg Oral QHS  . multivitamin with minerals  1 tablet Oral Daily  . pantoprazole  40 mg Oral Daily  . potassium chloride SA  20 mEq Oral Daily  . QUEtiapine  25 mg Oral QHS  . sertraline  25 mg Oral Daily  . cyanocobalamin  500 mcg Oral Daily  . vitamin E  400 Units Oral Daily   Continuous Infusions:  PRN Meds: acetaminophen, ipratropium-albuterol, nitroGLYCERIN, ondansetron (ZOFRAN) IV   Vital Signs    Vitals:   08/11/16 2342 08/12/16 0516 08/12/16 0958 08/12/16 1230  BP: (!) 109/54 123/71  (!) 102/55  Pulse: 80 79  81  Resp: (!) Temp: 98.3 F (36.8 C) 97.8 F (36.6 C)  97.6 F (36.4 C)  TempSrc: Oral Oral  Oral  SpO2: 93% 98%  96%  Weight: 91 lb 7.9 oz (41.5 kg)  91 lb (41.3 kg)   Height:    (1.651 m)     Intake/Output Summary (Last 24 hours) at 08/12/16 1252 Last data filed at 08/12/16 1232  Gross per 24 hour  Intake              240 ml  Output              200 ml  Net               40 ml   Filed Weights   08/11/16 2342 08/12/16 0958  Weight: 91 lb 7.9 oz (41.5 kg) 91 lb (41.3 kg)    Telemetry    NSR - Personally Reviewed  ECG     - Personally Reviewed  Physical Exam   GEN: No acute distress.   Neck: No  JVD Cardiac: RRR, no murmurs, rubs, or gallops.  Respiratory: Clear  to auscultation bilaterally. GI: Soft, nontender, non-distended  MS: No  edema; No deformity. Neuro:  Nonfocal  Psych: Normal affect  .  Pleasantly demented.   Labs    Chemistry Recent Labs Lab 08/11/16 1335  NA 135  K 4.3  CL 99*  CO2 27  GLUCOSE 104*  BUN 11  CREATININE 1.11*  CALCIUM 8.9  PROT 6.0*  ALBUMIN 3.1*  AST 40  ALT 20  ALKPHOS 78  BILITOT 1.2  GFRNONAA 42*  GFRAA 49*  ANIONGAP 9     Hematology Recent Labs Lab 08/11/16 1335  WBC 7.7  RBC 4.63  HGB 14.1  HCT 43.2  MCV 93.3  MCH 30.5  MCHC 32.6  RDW 14.6  PLT 142*    Cardiac Enzymes Recent Labs Lab 08/12/16 0052 08/12/16 0813 08/12/16 1126  TROPONINI <0.03 <0.03 0.03*    Recent Labs Lab 08/11/16 1412 08/11/16 2039  TROPIPOC 0.01 0.01     BNPNo results for input(s): BNP, PROBNP in the last 168 hours.   DDimer No results for  input(s): DDIMER in the last 168 hours.   Radiology    Dg Chest 2 View  Result Date: 08/11/2016 CLINICAL DATA:  Chest pain today.  History of hypertension. EXAM: CHEST  2 VIEW COMPARISON:  07/07/2016 FINDINGS: Changes from CABG surgery are stable. Cardiac silhouette is top-normal in size. No mediastinal or hilar masses. No evidence of adenopathy. There are prominent bronchovascular markings similar to the prior exam. No evidence of pneumonia or pulmonary edema. Small left pleural effusion similar to the prior study. No right pleural effusion. No pneumothorax. Skeletal structures are demineralized but grossly intact. IMPRESSION: No acute cardiopulmonary disease. Electronically Signed   By: Amie Portland M.D.   On: 08/11/2016 14:56    Cardiac Studies   NA  Patient Profile     83 y.o. female w/ history of CAD (s/p CABG), HFpEF, Afib (not on AC) and HTN who presents after episode of chest pain  Assessment & Plan    CHEST PAIN:   Enzymes negative.   No further pain.  Discussed with the patient and her daughter.  Without objective evidence of ischemia I would not suggest any further work up.  They will let us know if she has recurrent symptoms.    Signed, Rollene Rotunda, MD  08/12/2016, 12:52 PM

## 2016-08-12 NOTE — Discharge Summary (Signed)
Discharge Summary    Patient ID: Jasmine [name],  MRN: [id number], DOB/AGE: 23-Jul-1922 83 y.o.  Admit date: 08/11/2016 Discharge date: 08/12/2016  Primary Care Provider: Nicki Reaper Primary Cardiologist: Dr. Allyson Sabal  Discharge Diagnoses    Active Problems:   Chest pain   Allergies Allergies  Allergen Reactions  . Codeine     Reaction unknown to patient  . Meperidine Hcl     Reaction unknown to patient  . Oxycodone-Acetaminophen     Reaction unknown to patient    Diagnostic Studies/Procedures    None _____________   History of Present Illness     Jasmine [name] is a 83 year old female with a past medical history significant for CAD(S/P CABG), heart failure with preserved ejection fraction, atrial fibrillation(not anticoagulated) and hypertension who presented to the [facility] emergency department with complaints of chest pain. The patient is very pleasant, but slightly confused about the details. It was noted that she has possible sundowning. The patient had developed chest pain around 12:30 PM on 4/24 while lying in bed. She had eaten breakfast and was spending day in bed watching TV when she developed sharp/stabbing pain over MEG/substernum. She cannot recall what made the pain improves or how long it had lasted. She denied associated nausea, vomiting or diaphoresis. Of note she had been recently admitted to the hospital for sepsis secondary to pneumonia and flu on 07/21/16. She was admitted for observation.  Hospital Course     Consultants: None  Cardiac enzymes were negative and the patient had no further chest pain. EKG showed atrial fibrillation without acute ischemic changes. Without objective evidence of ischemia no further cardiac workup is suggested. The patient and her daughter will let us know if she has any recurrent symptoms. She will be discharged back to the rehabilitation facility on her current medical regimen.  Patient has been seen by Dr. Antoine Poche today and  deemed ready for discharge home. All follow up appointments have been scheduled. Discharge medications are listed below. _____________  Discharge Vitals Blood pressure (!) 102/55, pulse 81, temperature 97.6 F (36.4 C), temperature source Oral, resp. rate 20, height  (1.651 m), weight 91 lb (41.3 kg), SpO2 96 %.  Filed Weights   08/11/16 2342 08/12/16 0958  Weight: 91 lb 7.9 oz (41.5 kg) 91 lb (41.3 kg)    Labs & Radiologic Studies    CBC  Recent Labs  08/11/16 1335  WBC 7.7  NEUTROABS 5.2  HGB 14.1  HCT 43.2  MCV 93.3  PLT 142*   Basic Metabolic Panel  Recent Labs  08/11/16 1335  NA 135  K 4.3  CL 99*  CO2 27  GLUCOSE 104*  BUN 11  CREATININE 1.11*  CALCIUM 8.9   Liver Function Tests  Recent Labs  08/11/16 1335  AST 40  ALT 20  ALKPHOS 78  BILITOT 1.2  PROT 6.0*  ALBUMIN 3.1*   No results for input(s): LIPASE, AMYLASE in the last 72 hours. Cardiac Enzymes  Recent Labs  08/12/16 0052 08/12/16 0813 08/12/16 1126  TROPONINI <0.03 <0.03 0.03*   BNP Invalid input(s): POCBNP D-Dimer No results for input(s): DDIMER in the last 72 hours. Hemoglobin A1C No results for input(s): HGBA1C in the last 72 hours. Fasting Lipid Panel No results for input(s): CHOL, HDL, LDLCALC, TRIG, CHOLHDL, LDLDIRECT in the last 72 hours. Thyroid Function Tests  Recent Labs  08/12/16 0052  TSH 0.733   _____________  Dg Chest 2 View  Result Date: 08/11/2016  CLINICAL DATA:  Chest pain today.  History of hypertension. EXAM: CHEST  2 VIEW COMPARISON:  07/07/2016 FINDINGS: Changes from CABG surgery are stable. Cardiac silhouette is top-normal in size. No mediastinal or hilar masses. No evidence of adenopathy. There are prominent bronchovascular markings similar to the prior exam. No evidence of pneumonia or pulmonary edema. Small left pleural effusion similar to the prior study. No right pleural effusion. No pneumothorax. Skeletal structures are demineralized but  grossly intact. IMPRESSION: No acute cardiopulmonary disease. Electronically Signed   By: Amie Portland M.D.   On: 08/11/2016 14:56   Dg Chest 2 View  Result Date: 07/15/2016 CLINICAL DATA:  Cough for 3-4 days, headache for 2-3 days, atrial fibrillation, coronary artery disease, hypertension, GERD, former smoker EXAM: CHEST  2 VIEW COMPARISON:  09/09/2015 FINDINGS: Normal heart size post CABG. Calcified tortuous aorta. Pulmonary vascularity normal. Bronchitic changes with chronic accentuation of RIGHT upper lobe markings, little changed. Persistent blunting of the LEFT lateral costophrenic angle and mild LEFT basilar atelectasis, with associated small LEFT pleural effusion. Minimal RIGHT basilar atelectasis. No definite acute infiltrate or pneumothorax. Bones diffusely demineralized. IMPRESSION: Post CABG. Aortic atherosclerosis. LEFT pleural effusion with bibasilar atelectasis greater on LEFT. Underlying emphysematous changes with chronic increase in RIGHT upper lobe markings, may represent scarring, unable to exclude recurrent infiltrate. Electronically Signed   By: Ulyses Southward M.D.   On: 07/15/2016 17:48   Ct Head Wo Contrast  Result Date: 07/16/2016 CLINICAL DATA:  Altered mental status, lethargy, hypertension, coronary artery disease, atrial fibrillation EXAM: CT HEAD WITHOUT CONTRAST TECHNIQUE: Contiguous axial images were obtained from the base of the skull through the vertex without intravenous contrast. COMPARISON:  07/15/2016 FINDINGS: Brain: Generalized atrophy. Normal ventricular morphology. No midline shift or mass effect. Question new lacunar infarct at the LEFT external capsule. Question additional lacunar infarct at LEFT thalamus. Small vessel chronic ischemic changes of deep cerebral white matter. No acute cortical infarction identified. No intracranial hemorrhage, mass lesion, or extra-axial fluid collection. Vascular: Atherosclerotic calcification of the internal carotid and vertebral  arteries at the skullbase. Skull: Diffuse osseous demineralization Sinuses/Orbits: Cleared Other: N/A IMPRESSION: Atrophy with small vessel chronic ischemic changes of deep cerebral white matter. Questionable lacunar infarcts at the LEFT thalamus and LEFT external capsule. Electronically Signed   By: Ulyses Southward M.D.   On: 07/16/2016 17:16   Ct Head Wo Contrast  Result Date: 07/15/2016 CLINICAL DATA:  Cough and headache x3 days. EXAM: CT HEAD WITHOUT CONTRAST TECHNIQUE: Contiguous axial images were obtained from the base of the skull through the vertex without intravenous contrast. COMPARISON:  01/22/2015 CT FINDINGS: BRAIN: There is sulcal prominence consistent with superficial atrophy similar to prior exam. No intraparenchymal hemorrhage, mass effect nor midline shift. Periventricular and subcortical white matter hypodensities consistent with chronic mild small vessel ischemic disease are identified. No acute large vascular territory infarcts. No abnormal extra-axial fluid collections. Basal cisterns are not effaced and midline. VASCULAR: Moderate calcific atherosclerosis of the carotid siphons and both vertebral arteries. SKULL: No skull fracture. No significant scalp soft tissue swelling. SINUSES/ORBITS: The mastoid air-cells are clear. The included paranasal sinuses are well-aerated.The included ocular globes and orbital contents are non-suspicious. OTHER: None. IMPRESSION: Mild superficial atrophy with chronic small vessel ischemic disease periventricular white matter. No acute intracranial abnormality noted. Electronically Signed   By: Tollie Eth M.D.   On: 07/15/2016 20:07   Disposition   Pt is being discharged home today in good condition.  Follow-up Plans & Appointments  Follow-up Information    Nanetta Batty, MD Follow up.   Specialties:  Cardiology, Radiology Why:  Call office for appointment as needed.  Contact information: [address] [place]  [address] [phone]          Discharge Instructions    Diet - low sodium heart healthy    Complete by:  As directed    Increase activity slowly    Complete by:  As directed       Discharge Medications   Current Discharge Medication List    START taking these medications   Details  nitroGLYCERIN (NITROSTAT) 0.4 MG SL tablet Place 1 tablet (0.4 mg total) under the tongue every 5 (five) minutes x 3 doses as needed for chest pain. Qty: 25 tablet, Refills: 12      CONTINUE these medications which have NOT CHANGED   Details  acetaminophen (TYLENOL) 500 MG tablet Take 1 tablet (500 mg total) by mouth every 6 (six) hours as needed for mild pain. For pain Qty: 30 tablet, Refills: 0    aspirin 81 MG chewable tablet Chew 81 mg by mouth daily.    atorvastatin (LIPITOR) 80 MG tablet TAKE 1 TABLET DAILY Qty: 90 tablet, Refills: 1    bisoprolol (ZEBETA) 10 MG tablet TAKE 1 TABLET DAILY Qty: 90 tablet, Refills: 0    Calcium Carbonate-Vitamin D (CALCIUM 600-D) 600-400 MG-UNIT per tablet Take 1 tablet by mouth daily.     cyanocobalamin 500 MCG tablet Take 500 mcg by mouth daily.      Dextromethorphan-Guaifenesin (ROBITUSSIN COUGH/CONGESTION PO) Take 10 mLs by mouth every 8 (eight) hours as needed (for cough/congestion).     diltiazem (TIAZAC) 120 MG 24 hr capsule Take 1 capsule (120 mg total) by mouth daily. Qty: 90 capsule, Refills: 3    furosemide (LASIX) 20 MG tablet TAKE 1 TABLET DAILY (MUST SCHEDULE ANNUAL EXAM) Qty: 90 tablet, Refills: 0    ipratropium-albuterol (DUONEB) 0.5-2.5 (3) MG/3ML SOLN Take 3 mLs by nebulization every 6 (six) hours as needed. Qty: 360 mL, Refills: 0    levothyroxine (SYNTHROID, LEVOTHROID) 88 MCG tablet TAKE 1 TABLET DAILY Qty: 90 tablet, Refills: 3    mirtazapine (REMERON) 45 MG tablet TAKE 1 TABLET AT BEDTIME Qty: 90 tablet, Refills: 1    Multiple Vitamin (MULTIVITAMIN) capsule Take 1 capsule by mouth daily.      omeprazole (PRILOSEC) 20 MG  capsule TAKE 1 CAPSULE DAILY Qty: 90 capsule, Refills: 3    OXYGEN Inhale 2 L into the lungs at bedtime.    potassium chloride SA (KLOR-CON M20) 20 MEQ tablet Take 1 tablet (20 mEq total) by mouth daily. Qty: 90 tablet, Refills: 1    QUEtiapine (SEROQUEL) 25 MG tablet TAKE 1 TABLET AT BEDTIME Qty: 90 tablet, Refills: 1    sertraline (ZOLOFT) 25 MG tablet Take 1 tablet (25 mg total) by mouth daily.    vitamin E 400 UNIT capsule Take 400 Units by mouth daily.      clonazePAM (KLONOPIN) 0.5 MG tablet Take 1 tablet (0.5 mg total) by mouth 2 (two) times daily as needed for anxiety. Qty: 10 tablet, Refills: 0    UNABLE TO FIND Med Name: Med Pass 2.0 90 mL twice daily         Outstanding Labs/Studies   None  Duration of Discharge Encounter   Greater than 30 minutes including physician time.  Signed, Berton Bon NP 08/12/2016, 1:51 PM

## 2016-08-12 NOTE — Progress Notes (Signed)
Report given to [facility] nurse.

## 2016-08-12 NOTE — Progress Notes (Signed)
Pt for d/c back to [facility] today via ambulance. Pt's dtr at bedside and agreeable to d/c. CSW spoke with Paula Compton at [facility] who confirmed pt able to return today. DC summary and orders sent via EPIC to facility. RN to call report. PTAR arranged for transport. CSW signing off at d/c.   Dellie Burns, MSW, LCSW

## 2016-08-12 NOTE — Clinical Social Work Note (Signed)
Clinical Social Work Assessment  Patient Details  Name: Jasmine [name] MRN: [id number] Date of Birth: 08-18-1922  Date of referral:  08/12/16               Reason for consult:  Facility Placement                Permission sought to share information with:  Facility Industrial/product designer granted to share information::     Name::        Agency::  [name]  Relationship::     Contact Information:     Housing/Transportation Living arrangements for the past 2 months:  Location manager of Information:  Power of [place], Adult Children Patient Interpreter Needed:  None Criminal Activity/Legal Involvement Pertinent to Current Situation/Hospitalization:  No - Comment as needed Significant Relationships:  Adult Children Lives with:  Facility Resident Do you feel safe going back to the place where you live?  Yes Need for family participation in patient care:  Yes (Comment)  Care giving concerns:     Social Worker assessment / plan:  Pt admitted from [facility] where she has been at rehab resident. Spoke with pt's dtr/HCPOA [phone], who confirmed pt admitted from [facility] and the plan is for return at d/c. Awaiting return call from [place] with [facility] to confirm pt is able to return at d/c. Will follow.   Employment status:    Insurance information:  Medicare PT Recommendations:    Information / Referral to community resources:  Skilled Nursing Facility  Patient/Family's Response to care:  Pt confused. Pt's dtr/HCPOA agreeable to above plan.   Patient/Family's Understanding of and Emotional Response to Diagnosis, Current Treatment, and Prognosis:    Emotional Assessment Appearance:    Attitude/Demeanor/Rapport:    Affect (typically observed):    Orientation:    Alcohol / Substance use:    Psych involvement (Current and /or in the community):  No (Comment)  Discharge Needs  Concerns to be addressed:  Discharge Planning Concerns Readmission within  the last 30 days:  Yes Current discharge risk:    Barriers to Discharge:  No Barriers Identified   Jasmine Robinson, LCSW 08/12/2016, 12:43 PM

## 2016-08-12 NOTE — NC FL2 (Signed)
[place] MEDICAID FL2 LEVEL OF CARE SCREENING TOOL     IDENTIFICATION  Patient Name: Jasmine [name] Birthdate: 1922-12-09 Sex: female Admission Date (Current Location): 08/11/2016  [facility] and [place] Number:  Producer, television/film/video and Address:  [facility], [address] [place], [place] [address]      Provider Number: [id number]  Attending Physician Name and Address:  Wilma Flavin, MD  Relative Name and Phone Number:       Current Level of Care: Hospital Recommended Level of Care: Skilled Nursing Facility Prior Approval Number:    Date Approved/Denied:   PASRR Number:    Discharge Plan: SNF    Current Diagnoses: Patient Active Problem List   Diagnosis Date Noted  . Chest pain 08/11/2016  . Influenza 07/16/2016  . Acute respiratory failure with hypoxemia (HCC) 07/16/2016  . Protein-calorie malnutrition, severe 07/16/2016  . Sepsis (HCC) 07/15/2016  . DVT (deep venous thrombosis) (HCC) 07/15/2016  . COPD with acute exacerbation (HCC) 07/15/2016  . Constipation 11/20/2015  . Thoracic back pain 11/20/2015  . Thrombocytopenia (HCC) 01/23/2015  . Chronic diastolic CHF (congestive heart failure) (HCC) 01/22/2015  . Carotid artery disease (HCC) 03/22/2014  . CAD - CABG '97, cath OK 2005, Myoview low risk 12/12 01/06/2013  . HTN (hypertension) 01/06/2013  . Chronic atrial fibrillation (HCC) 01/06/2013  . BARRETT'S ESOPHAGUS 06/13/2008  . INSOMNIA, CHRONIC 01/17/2007  . Hypothyroidism 01/12/2007  . HLD (hyperlipidemia) 01/12/2007  . Anxiety state 01/12/2007    Orientation RESPIRATION BLADDER Height & Weight     Self, Time, Situation, Place    Continent Weight: 91 lb (41.3 kg) Height:   (165.1 cm)  BEHAVIORAL SYMPTOMS/MOOD NEUROLOGICAL BOWEL NUTRITION STATUS      Continent    AMBULATORY STATUS COMMUNICATION OF NEEDS Skin     Verbally Normal                       Personal Care Assistance Level of Assistance  Feeding,  Dressing, Bathing Bathing Assistance: Limited assistance Feeding assistance: Limited assistance Dressing Assistance: Limited assistance     Functional Limitations Info  Sight, Hearing, Speech Sight Info: Impaired Hearing Info: Impaired Speech Info: Adequate    SPECIAL CARE FACTORS FREQUENCY  PT (By licensed PT), OT (By licensed OT)                    Contractures Contractures Info: Not present    Additional Factors Info    Code Status Info: FULL CODE             Current Medications (08/12/2016):  This is the current hospital active medication list Current Facility-Administered Medications  Medication Dose Route Frequency Provider Last Rate Last Dose  . acetaminophen (TYLENOL) tablet 500 mg  500 mg Oral Q6H PRN Wilma Flavin, MD      . aspirin chewable tablet 81 mg  81 mg Oral Daily Wilma Flavin, MD   81 mg at 08/12/16 0944  . atorvastatin (LIPITOR) tablet 80 mg  80 mg Oral Daily Wilma Flavin, MD   80 mg at 08/12/16 0944  . bisoprolol (ZEBETA) tablet 10 mg  10 mg Oral Daily Wilma Flavin, MD   10 mg at 08/12/16 0945  . calcium-vitamin D (OSCAL WITH D) 500-200 MG-UNIT per tablet 1 tablet  1 tablet Oral Daily Wilma Flavin, MD   1 tablet at 08/12/16 0944  . diltiazem (CARDIZEM CD) 24 hr capsule  120 mg  120 mg Oral Daily Wilma Flavin, MD   120 mg at 08/12/16 0944  . furosemide (LASIX) tablet 20 mg  20 mg Oral Daily Wilma Flavin, MD   20 mg at 08/12/16 0945  . heparin injection 5,000 Units  5,000 Units Subcutaneous BID Wilma Flavin, MD   5,000 Units at 08/12/16 0944  . ipratropium-albuterol (DUONEB) 0.5-2.5 (3) MG/3ML nebulizer solution 3 mL  3 mL Nebulization Q6H PRN Wilma Flavin, MD      . levothyroxine (SYNTHROID, LEVOTHROID) tablet 88 mcg  88 mcg Oral QAC breakfast Wilma Flavin, MD   88 mcg at 08/12/16 0610  . mirtazapine (REMERON) tablet 45 mg  45 mg Oral QHS Wilma Flavin, MD   45 mg at 08/12/16 0008  . multivitamin with minerals tablet 1 tablet  1  tablet Oral Daily Wilma Flavin, MD   1 tablet at 08/12/16 0944  . nitroGLYCERIN (NITROSTAT) SL tablet 0.4 mg  0.4 mg Sublingual Q5 Min x 3 PRN Wilma Flavin, MD      . ondansetron [facility]) injection 4 mg  4 mg Intravenous Q6H PRN Wilma Flavin, MD      . pantoprazole (PROTONIX) EC tablet 40 mg  40 mg Oral Daily Wilma Flavin, MD   40 mg at 08/12/16 0944  . potassium chloride SA (K-DUR,KLOR-CON) CR tablet 20 mEq  20 mEq Oral Daily Wilma Flavin, MD   20 mEq at 08/12/16 0944  . QUEtiapine (SEROQUEL) tablet 25 mg  25 mg Oral QHS Wilma Flavin, MD   25 mg at 08/12/16 0008  . sertraline (ZOLOFT) tablet 25 mg  25 mg Oral Daily Wilma Flavin, MD   25 mg at 08/12/16 0944  . vitamin B-12 (CYANOCOBALAMIN) tablet 500 mcg  500 mcg Oral Daily Wilma Flavin, MD   500 mcg at 08/12/16 1000  . vitamin E capsule 400 Units  400 Units Oral Daily Wilma Flavin, MD   400 Units at 08/12/16 1610     Discharge Medications: Please see discharge summary for a list of discharge medications.  Relevant Imaging Results:  Relevant Lab Results:   Additional Information    Deatra Robinson, [place]

## 2016-08-13 ENCOUNTER — Encounter: Payer: Self-pay | Admitting: Internal Medicine

## 2016-08-13 ENCOUNTER — Non-Acute Institutional Stay (SKILLED_NURSING_FACILITY): Payer: Medicare Other | Admitting: Internal Medicine

## 2016-08-13 DIAGNOSIS — I482 Chronic atrial fibrillation, unspecified: Secondary | ICD-10-CM

## 2016-08-13 DIAGNOSIS — F418 Other specified anxiety disorders: Secondary | ICD-10-CM | POA: Insufficient documentation

## 2016-08-13 DIAGNOSIS — K5900 Constipation, unspecified: Secondary | ICD-10-CM | POA: Diagnosis not present

## 2016-08-13 DIAGNOSIS — J9611 Chronic respiratory failure with hypoxia: Secondary | ICD-10-CM

## 2016-08-13 DIAGNOSIS — R531 Weakness: Secondary | ICD-10-CM

## 2016-08-13 DIAGNOSIS — I5032 Chronic diastolic (congestive) heart failure: Secondary | ICD-10-CM | POA: Diagnosis not present

## 2016-08-13 DIAGNOSIS — I2510 Atherosclerotic heart disease of native coronary artery without angina pectoris: Secondary | ICD-10-CM

## 2016-08-13 DIAGNOSIS — I959 Hypotension, unspecified: Secondary | ICD-10-CM | POA: Diagnosis not present

## 2016-08-13 DIAGNOSIS — R4189 Other symptoms and signs involving cognitive functions and awareness: Secondary | ICD-10-CM | POA: Diagnosis not present

## 2016-08-13 DIAGNOSIS — E039 Hypothyroidism, unspecified: Secondary | ICD-10-CM | POA: Diagnosis not present

## 2016-08-13 NOTE — Progress Notes (Signed)
LOCATION: [facility]  PCP: Nicki Reaper, NP   Code Status: DNR  Goals of care: Advanced Directive information Advanced Directives 08/11/2016  Does Patient Have a Medical Advance Directive? Yes  Type of Advance Directive Out of facility DNR (pink MOST or yellow form)  Does patient want to make changes to medical advance directive? -  Copy of Healthcare Power of Attorney in Chart? Yes  Would patient like information on creating a medical advance directive? -  Pre-existing out of facility DNR order (yellow form or pink MOST form) -       Extended Emergency Contact Information Primary Emergency Contact: Maness,[name] Address: [address]          [place], [place] [address] [place] Home Phone: [phone] Mobile Phone: [phone] Relation: Daughter Secondary Emergency Contact: Melba [place] Work Phone: [phone] Mobile Phone: [phone] Relation: Grandaughter   Allergies  Allergen Reactions  . Codeine     Reaction unknown to patient  . Meperidine Hcl     Reaction unknown to patient  . Oxycodone-Acetaminophen     Reaction unknown to patient    Chief Complaint  Patient presents with  . Readmit To SNF    Readmission Visit      HPI:  Patient is a 83 y.o. female seen today for short term rehabilitation post hospital re-admission from 08/11/16-08/12/16 with chest pain. she was admitted by cardiology service for observation. EKG was negative for ischemic changes and troponin were negative. Medical management was continued and she was discharged back to SNF for STR. She has been here for short term rehabilitation post hospital admission from 07/15/16-07/21/16 with acute on chronic respiratory failure and sepsis with bibasilar streptococcus pneumonia and influenza. She has medical history of CAD, chronic afib, chronic CHF, COPD on oxygen at bedtime, GERD, HTN among others. She is seen in her room today.    Review of  Systems:  Constitutional: Negative for fever, chills. Feels weak and tired. HENT: Negative for headache, congestion, nasal discharge, sore throat, difficulty swallowing.   Eyes: Negative for blurred vision, double vision and discharge.  Respiratory: Negative for shortness of breath and wheezing. Positive for occasional cough.  Cardiovascular: Negative for chest pain, palpitations, leg swelling.  Gastrointestinal: Negative for heartburn, nausea, vomiting,melena, diarrhea and constipation. Positive for poor appetite. Last bowel movement was several days ago.  Genitourinary: Negative for dysuria.  Musculoskeletal: Negative for back pain, fall in the facility.  Skin: Negative for itching, rash.  Neurological: Positive for occasional dizziness. Psychiatric/Behavioral: Negative for depression   Past Medical History:  Diagnosis Date  . Anxiety   . Atrial fibrillation with RVR (HCC)   . Barrett's esophagus   . CAD (coronary artery disease) of artery bypass graft 1995  . Fracture of right shoulder 02/2008  . GERD (gastroesophageal reflux disease)   . Hemorrhoids, external   . Hiatal hernia   . History of colon polyps   . Hyperlipidemia   . Hypertension   . Hypothyroidism   . Insomnia due to medical condition   . Left wrist fracture 09/2007  . Low back pain   . Syncope    Past Surgical History:  Procedure Laterality Date  . CARDIAC CATHETERIZATION  02/25/1996   No intervention-recommend CABG. Left main-80% concentric distal stenosis, LAD-99% ostial stenosis, first diag-80% segmental proximal stenosis, Circumflex-80% ostial stenosis, RCA-90% ostial stenosis.  Marland Kitchen CARDIAC CATHETERIZATION  05/21/2003   No intervention-widely patent grafts.  . CAROTID DOPPLER Bilateral 05/15/2010  Right Bulb/CEA-demonstrated trace irregular nonhemodynamically significant plaque 0-49%. Left Bulb/Proximal ICA-demonstrated irregular nonhemodynamically signifcant plaque 0-49%.  . CAROTID ENDARTERECTOMY    .  CATARACT EXTRACTION    . COLONOSCOPY W/ POLYPECTOMY  06/2005  . CORONARY ARTERY BYPASS GRAFT  1997  . LEXISCAN MYOVIEW  04/03/2011   No scintigraphic evidence of inducible myocardial ischemia. No lexiscan EKG changes. Non-diagnostic for ischemia.   . TRANSTHORACIC ECHOCARDIOGRAM  01/08/2011   EF 50-55%, LA moderately dilated, moderate tricuspid regurg, mild pulmonary hypertension.   Social History:   reports that she quit smoking about 48 years ago. She has never used smokeless tobacco. She reports that she does not drink alcohol or use drugs.  Family History  Problem Relation Age of Onset  . Heart disease Mother   . Heart disease Father   . Diabetes Son     Medications: Allergies as of 08/13/2016      Reactions   Codeine    Reaction unknown to patient   Meperidine Hcl    Reaction unknown to patient   Oxycodone-acetaminophen    Reaction unknown to patient      Medication List       Accurate as of 08/13/16 10:27 AM. Always use your most recent med list.          acetaminophen 500 MG tablet Commonly known as:  TYLENOL Take 1 tablet (500 mg total) by mouth every 6 (six) hours as needed for mild pain. For pain   aspirin 81 MG chewable tablet Chew 81 mg by mouth daily.   atorvastatin 80 MG tablet Commonly known as:  LIPITOR TAKE 1 TABLET DAILY   bisoprolol 10 MG tablet Commonly known as:  ZEBETA TAKE 1 TABLET DAILY   CALCIUM 600-D 600-400 MG-UNIT tablet Generic drug:  Calcium Carbonate-Vitamin D Take 1 tablet by mouth daily.   clonazePAM 0.5 MG tablet Commonly known as:  KLONOPIN Take 1 tablet (0.5 mg total) by mouth 2 (two) times daily as needed for anxiety.   cyanocobalamin 500 MCG tablet Take 500 mcg by mouth daily.   diltiazem 120 MG 24 hr capsule Commonly known as:  TIAZAC Take 1 capsule (120 mg total) by mouth daily.   furosemide 20 MG tablet Commonly known as:  LASIX TAKE 1 TABLET DAILY (MUST SCHEDULE ANNUAL EXAM)   ipratropium-albuterol 0.5-2.5  (3) MG/3ML Soln Commonly known as:  DUONEB Take 3 mLs by nebulization every 6 (six) hours as needed.   levothyroxine 88 MCG tablet Commonly known as:  SYNTHROID, LEVOTHROID TAKE 1 TABLET DAILY   mirtazapine 45 MG tablet Commonly known as:  REMERON TAKE 1 TABLET AT BEDTIME   multivitamin capsule Take 1 capsule by mouth daily.   nitroGLYCERIN 0.4 MG SL tablet Commonly known as:  NITROSTAT Place 1 tablet (0.4 mg total) under the tongue every 5 (five) minutes x 3 doses as needed for chest pain.   omeprazole 20 MG capsule Commonly known as:  PRILOSEC TAKE 1 CAPSULE DAILY   OXYGEN Inhale 2 L into the lungs at bedtime.   potassium chloride SA 20 MEQ tablet Commonly known as:  KLOR-CON M20 Take 1 tablet (20 mEq total) by mouth daily.   QUEtiapine 25 MG tablet Commonly known as:  SEROQUEL TAKE 1 TABLET AT BEDTIME   ROBITUSSIN COUGH/CONGESTION PO Take 10 mLs by mouth every 8 (eight) hours as needed (for cough/congestion).   sertraline 25 MG tablet Commonly known as:  ZOLOFT Take 1 tablet (25 mg total) by mouth daily.   UNABLE TO  FIND Med Name: Med Pass 2.0 90 mL twice daily   vitamin E 400 UNIT capsule Take 400 Units by mouth daily.       Immunizations: Immunization History  Administered Date(s) Administered  . Influenza Split 02/13/2011  . Influenza Whole 04/20/2004, 02/16/2007, 01/20/2008, 01/29/2009, 01/08/2010  . PPD Test 07/21/2016, 08/04/2016  . Pneumococcal Polysaccharide-23 01/08/2010     Physical Exam: Vitals:   08/13/16 1003  BP: (!) 102/59  Pulse: 80  Resp: 20  Temp: 97.9 F (36.6 C)  TempSrc: Oral  SpO2: 97%  Weight: 96 lb 3.2 oz (43.6 kg)  Height:  (1.626 m)   Body mass index is 16.51 kg/m.   General- elderly female, frail and thin built, in no acute distress Head- normocephalic, atraumatic Nose- no maxillary or frontal sinus tenderness, no nasal discharge Throat- moist mucus membrane, normal oropharynx, has dentures Eyes-  PERRLA, EOMI, no pallor, no icterus, no discharge, normal conjunctiva, normal sclera Neck- no cervical lymphadenopathy Cardiovascular- irregular heart rate, no murmur Respiratory- CTAB, no wheeze, no rhonchi, no crackles, no use of accessory muscles Abdomen- bowel sounds present, soft, non tender, no guarding or rigidity Musculoskeletal- able to move all 4 extremities, generalized weakness, no leg edema Neurological- alert and oriented to person, place and month but not to year Skin- warm and dry Psychiatry- normal mood and affect   Labs reviewed: Basic Metabolic Panel:  Recent Labs  16/10/96 2343  07/18/16 0333 07/21/16 0519 07/27/16 08/11/16 1335  NA  --   < > 137 140 141 135  K  --   < > 3.9 4.1 3.7 4.3  CL  --   < > 104 99*  --  99*  CO2  --   < > 28 35*  --  27  GLUCOSE  --   < > 103* 121*  --  104*  BUN  --   < > 20 21* 11 11  CREATININE  --   < > 0.73 0.64 0.8 1.11*  CALCIUM  --   < > 8.1* 8.3*  --  8.9  MG 1.9  --   --   --   --   --   < > = values in this interval not displayed. Liver Function Tests:  Recent Labs  05/22/16 1453 07/15/16 1627 08/11/16 1335  AST 32 48* 40  ALT ALKPHOS 95 89 78  BILITOT 0.8 1.0 1.2  PROT 7.1 7.0 6.0*  ALBUMIN 3.8 4.1 3.1*   No results for input(s): LIPASE, AMYLASE in the last 8760 hours. No results for input(s): AMMONIA in the last 8760 hours. CBC:  Recent Labs  07/15/16 1627  07/17/16 0328 07/21/16 0519 07/27/16 08/11/16 1335  WBC 4.3  < > 8.7 6.2 6.6 7.7  NEUTROABS 3.2  --   --   --   --  5.2  HGB 13.0  < > 12.3 12.0 13.6 14.1  HCT 38.9  < > 37.8 37.3 42 43.2  MCV 91.5  < > 96.2 95.2  --  93.3  PLT 80*  < > 69* 82* 168 142*  < > = values in this interval not displayed. Cardiac Enzymes:  Recent Labs  08/12/16 0052 08/12/16 0813 08/12/16 1126  TROPONINI <0.03 <0.03 0.03*   BNP: Invalid input(s): POCBNP CBG:  Recent Labs  07/16/16 1325  GLUCAP 123*    Radiological Exams: Dg Chest 2  View  Result Date: 08/11/2016 CLINICAL DATA:  Chest pain today.  History of  hypertension. EXAM: CHEST  2 VIEW COMPARISON:  07/07/2016 FINDINGS: Changes from CABG surgery are stable. Cardiac silhouette is top-normal in size. No mediastinal or hilar masses. No evidence of adenopathy. There are prominent bronchovascular markings similar to the prior exam. No evidence of pneumonia or pulmonary edema. Small left pleural effusion similar to the prior study. No right pleural effusion. No pneumothorax. Skeletal structures are demineralized but grossly intact. IMPRESSION: No acute cardiopulmonary disease. Electronically Signed   By: Amie Portland M.D.   On: 08/11/2016 14:56   Dg Chest 2 View  Result Date: 07/15/2016 CLINICAL DATA:  Cough for 3-4 days, headache for 2-3 days, atrial fibrillation, coronary artery disease, hypertension, GERD, former smoker EXAM: CHEST  2 VIEW COMPARISON:  09/09/2015 FINDINGS: Normal heart size post CABG. Calcified tortuous aorta. Pulmonary vascularity normal. Bronchitic changes with chronic accentuation of RIGHT upper lobe markings, little changed. Persistent blunting of the LEFT lateral costophrenic angle and mild LEFT basilar atelectasis, with associated small LEFT pleural effusion. Minimal RIGHT basilar atelectasis. No definite acute infiltrate or pneumothorax. Bones diffusely demineralized. IMPRESSION: Post CABG. Aortic atherosclerosis. LEFT pleural effusion with bibasilar atelectasis greater on LEFT. Underlying emphysematous changes with chronic increase in RIGHT upper lobe markings, may represent scarring, unable to exclude recurrent infiltrate. Electronically Signed   By: Ulyses Southward M.D.   On: 07/15/2016 17:48   Ct Head Wo Contrast  Result Date: 07/16/2016 CLINICAL DATA:  Altered mental status, lethargy, hypertension, coronary artery disease, atrial fibrillation EXAM: CT HEAD WITHOUT CONTRAST TECHNIQUE: Contiguous axial images were obtained from the base of the skull through  the vertex without intravenous contrast. COMPARISON:  07/15/2016 FINDINGS: Brain: Generalized atrophy. Normal ventricular morphology. No midline shift or mass effect. Question new lacunar infarct at the LEFT external capsule. Question additional lacunar infarct at LEFT thalamus. Small vessel chronic ischemic changes of deep cerebral white matter. No acute cortical infarction identified. No intracranial hemorrhage, mass lesion, or extra-axial fluid collection. Vascular: Atherosclerotic calcification of the internal carotid and vertebral arteries at the skullbase. Skull: Diffuse osseous demineralization Sinuses/Orbits: Cleared Other: N/A IMPRESSION: Atrophy with small vessel chronic ischemic changes of deep cerebral white matter. Questionable lacunar infarcts at the LEFT thalamus and LEFT external capsule. Electronically Signed   By: Ulyses Southward M.D.   On: 07/16/2016 17:16   Ct Head Wo Contrast  Result Date: 07/15/2016 CLINICAL DATA:  Cough and headache x3 days. EXAM: CT HEAD WITHOUT CONTRAST TECHNIQUE: Contiguous axial images were obtained from the base of the skull through the vertex without intravenous contrast. COMPARISON:  01/22/2015 CT FINDINGS: BRAIN: There is sulcal prominence consistent with superficial atrophy similar to prior exam. No intraparenchymal hemorrhage, mass effect nor midline shift. Periventricular and subcortical white matter hypodensities consistent with chronic mild small vessel ischemic disease are identified. No acute large vascular territory infarcts. No abnormal extra-axial fluid collections. Basal cisterns are not effaced and midline. VASCULAR: Moderate calcific atherosclerosis of the carotid siphons and both vertebral arteries. SKULL: No skull fracture. No significant scalp soft tissue swelling. SINUSES/ORBITS: The mastoid air-cells are clear. The included paranasal sinuses are well-aerated.The included ocular globes and orbital contents are non-suspicious. OTHER: None. IMPRESSION:  Mild superficial atrophy with chronic small vessel ischemic disease periventricular white matter. No acute intracranial abnormality noted. Electronically Signed   By: Tollie Eth M.D.   On: 07/15/2016 20:07    Assessment/Plan  Generalized weakness Will have her work with physical therapy and occupational therapy team to help with gait training and muscle strengthening exercises.fall precautions. Skin care.  Encourage to be out of bed.   CAD Recent chest pain with hospital admission for observation, cardiac etiology was ruled out. No further chest pain. Continue aspirin 81 mg daily, bisoprolol 10 mg daily, statin and prn NTG  Hypotension Low BP this am. With her complaint of occasional dizziness and being on antihypertensive, add holding parameter. Check BP q shift and orthostatic BP daily for now.  Constipation Start senna s 1 tab qhs and monitor, maintain hydration  Chronic respiratory failure Continue o2 by nasal canula at bedtime. Continue prn duoneb and monitor. Continue prn robitussin.   Cognitive impairment Age related, provide supportive care. Monitor for behavior changes. Get SLP consult  Hypothyroidism Continue levothyroxine, no changes made  afib Controlled rate, continue diltiazem 120 mg daily and bisoprolol 10 mg daily. Continue aspirin 81 mg daily  Depression and Anxiety Stable at present, continue seroquel 25 mg qhs and remeron 45 mg daily, continue clonazepam 0.5 mg q12h prn  Chronic diastolic chf Ef 55-60%. Continue lasix 20 mg daily and bisoprolol 10 mg daily, monitor weight 3 days a week, check bmp. Continue kcl supplement   Goals of care: short term rehabilitation   Labs/tests ordered: cbc, bmp 08/17/16  Family/ staff Communication: reviewed care plan with patient and nursing supervisor  I have spent greater than 50 minutes for this encounter which includes reviewing hospital records, addressing above mentioned concerns, reviewing care plan with patient,  answering patient's concerns and counseling her.     Oneal Grout, MD Internal Medicine [facility] [address] [place], [place] [address] Cell Phone (Monday-Friday 8 am - 5 pm): [phone] On Call: [phone] and follow prompts after 5 pm and on weekends Office Phone: [phone] Office Fax: [phone]

## 2016-08-14 NOTE — ED Provider Notes (Signed)
C/ o anterior chest pressure earlier today. Asymptomatic at time of my interview. Exam Alert, nad lungs cta cor irregularly irregular , normal rate.    Doug Sou, MD 08/14/16 1818

## 2016-08-17 LAB — CBC AND DIFFERENTIAL
HEMATOCRIT: 45 % (ref 36–46)
Hemoglobin: 14.7 g/dL (ref 12.0–16.0)
PLATELETS: 118 10*3/uL — AB (ref 150–399)
WBC: 4.6 10^3/mL

## 2016-08-17 LAB — BASIC METABOLIC PANEL
BUN: 13 mg/dL (ref 4–21)
CREATININE: 1.1 mg/dL (ref 0.5–1.1)
Glucose: 99 mg/dL
Potassium: 4.5 mmol/L (ref 3.4–5.3)
Sodium: 140 mmol/L (ref 137–147)

## 2016-09-01 ENCOUNTER — Encounter: Payer: Self-pay | Admitting: Family

## 2016-09-01 ENCOUNTER — Non-Acute Institutional Stay (SKILLED_NURSING_FACILITY): Payer: Medicare Other | Admitting: Family

## 2016-09-01 DIAGNOSIS — J9611 Chronic respiratory failure with hypoxia: Secondary | ICD-10-CM

## 2016-09-01 DIAGNOSIS — I482 Chronic atrial fibrillation, unspecified: Secondary | ICD-10-CM

## 2016-09-01 DIAGNOSIS — I10 Essential (primary) hypertension: Secondary | ICD-10-CM

## 2016-09-01 DIAGNOSIS — F418 Other specified anxiety disorders: Secondary | ICD-10-CM | POA: Diagnosis not present

## 2016-09-01 DIAGNOSIS — I5032 Chronic diastolic (congestive) heart failure: Secondary | ICD-10-CM

## 2016-09-01 DIAGNOSIS — E782 Mixed hyperlipidemia: Secondary | ICD-10-CM

## 2016-09-01 DIAGNOSIS — R2681 Unsteadiness on feet: Secondary | ICD-10-CM

## 2016-09-01 DIAGNOSIS — K5901 Slow transit constipation: Secondary | ICD-10-CM

## 2016-09-01 DIAGNOSIS — E039 Hypothyroidism, unspecified: Secondary | ICD-10-CM | POA: Diagnosis not present

## 2016-09-02 NOTE — Progress Notes (Signed)
Location:  [facility] Room Number: 1201P Place of Service:  SNF [phone])  Provider: Richarda Blade FNP-C   PCP: Lorre Munroe, NP Patient Care Team: Lorre Munroe, NP as PCP - General (Internal Medicine)  Extended Emergency Contact Information Primary Emergency Contact: Maness,[name] Address: [address]          [place], [place] [address] [place] Home Phone: [phone] Mobile Phone: [phone] Relation: Daughter Secondary Emergency Contact: Melba [place] Work Phone: [phone] Mobile Phone: [phone] Relation: Grandaughter  Code Status: DNR Goals of care:  Advanced Directive information Advanced Directives 09/01/2016  Does Patient Have a Medical Advance Directive? Yes  Type of Advance Directive Out of facility DNR (pink MOST or yellow form)  Does patient want to make changes to medical advance directive? -  Copy of Healthcare Power of Attorney in Chart? -  Would patient like information on creating a medical advance directive? -  Pre-existing out of facility DNR order (yellow form or pink MOST form) -     Allergies  Allergen Reactions  . Codeine     Reaction unknown to patient  . Meperidine Hcl     Reaction unknown to patient  . Oxycodone-Acetaminophen     Reaction unknown to patient    Chief Complaint  Patient presents with  . Discharge Note    Discharge home from [facility]     HPI:  83 y.o. female seen today at [facility] for discharge home.She was here for short term rehabilitation post hospital re-admission from 08/11/2016-08/12/2016 with chest pain.She was admitted by cardiology service for observation. EKG was negative for ischemic changes and troponin were negative. Medical management was continued and she was discharged back to SNF for STR. Of note she was here for short term rehabilitation post hospital admission from  07/15/2016-07/21/2016 with acute on chronic respiratory failure and sepsis with bibasilar streptococcus pneumonia and influenza.She has a medical history of HTN,CAD, Hyperlipidemia, CHF,COPD,Hypothyroidism, Depression, Anxiety, GERD among other conditions.She is seen in her room today. She denies any acute issues this visit.While here in Rehab she was treated Sertraline 25 mg Tablet for symptoms of depression with much improvement.She is currently off oxygen with oxygen saturations above 93 %. She has oxygen concentrator at home.  She has worked well with PT/OT now stable for discharge home.She will be discharged home with Home health PT/OT to continue with ROM, Exercise, Gait stability and muscle strengthening. She does not require any DME states has own FWW and oxygen concentrator at home.Her Home health services will be arranged by facility social worker prior to discharge. Prescription medication will be written x 1 month then patient to follow up with PCP in 1-2 weeks. Facility staff report no new concerns.    Past Medical History:  Diagnosis Date  . Anxiety   . Atrial fibrillation with RVR (HCC)   . Barrett's esophagus   . CAD (coronary artery disease) of artery bypass graft 1995  . Fracture of right shoulder 02/2008  . GERD (gastroesophageal reflux disease)   . Hemorrhoids, external   . Hiatal hernia   . History of colon polyps   . Hyperlipidemia   . Hypertension   . Hypothyroidism   . Insomnia due to medical condition   . Left wrist fracture 09/2007  . Low back pain   . Syncope     Past Surgical History:  Procedure Laterality Date  . CARDIAC  CATHETERIZATION  02/25/1996   No intervention-recommend CABG. Left main-80% concentric distal stenosis, LAD-99% ostial stenosis, first diag-80% segmental proximal stenosis, Circumflex-80% ostial stenosis, RCA-90% ostial stenosis.  Marland Kitchen CARDIAC CATHETERIZATION  05/21/2003   No intervention-widely patent grafts.  . CAROTID DOPPLER Bilateral 05/15/2010     Right Bulb/CEA-demonstrated trace irregular nonhemodynamically significant plaque 0-49%. Left Bulb/Proximal ICA-demonstrated irregular nonhemodynamically signifcant plaque 0-49%.  . CAROTID ENDARTERECTOMY    . CATARACT EXTRACTION    . COLONOSCOPY W/ POLYPECTOMY  06/2005  . CORONARY ARTERY BYPASS GRAFT  1997  . LEXISCAN MYOVIEW  04/03/2011   No scintigraphic evidence of inducible myocardial ischemia. No lexiscan EKG changes. Non-diagnostic for ischemia.   . TRANSTHORACIC ECHOCARDIOGRAM  01/08/2011   EF 50-55%, LA moderately dilated, moderate tricuspid regurg, mild pulmonary hypertension.      reports that she quit smoking about 48 years ago. She has never used smokeless tobacco. She reports that she does not drink alcohol or use drugs. Social History   Social History  . Marital status: Widowed    Spouse name: N/A  . Number of children: N/A  . Years of education: N/A   Occupational History  . Not on file.   Social History Main Topics  . Smoking status: Former Smoker    Quit date: 04/20/1968  . Smokeless tobacco: Never Used     Comment: QUIT IN 1970  . Alcohol use No     Comment: STOPPED IN 1970  . Drug use: No  . Sexual activity: Not Currently   Other Topics Concern  . Not on file   Social History Narrative  . No narrative on file    Allergies  Allergen Reactions  . Codeine     Reaction unknown to patient  . Meperidine Hcl     Reaction unknown to patient  . Oxycodone-Acetaminophen     Reaction unknown to patient    Pertinent  Health Maintenance Due  Topic Date Due  . INFLUENZA VACCINE  05/22/2017 (Originally 11/18/2016)  . DEXA SCAN  06/02/2017 (Originally 09/03/1989)  . PNA vac Low Risk Adult (2 of 2 - PCV13) 06/02/2017 (Originally 01/09/2011)    Medications: Allergies as of 09/01/2016      Reactions   Codeine    Reaction unknown to patient   Meperidine Hcl    Reaction unknown to patient   Oxycodone-acetaminophen    Reaction unknown to patient       Medication List       Accurate as of 09/01/16 11:59 PM. Always use your most recent med list.          acetaminophen 500 MG tablet Commonly known as:  TYLENOL Take 1 tablet (500 mg total) by mouth every 6 (six) hours as needed for mild pain. For pain   aspirin 81 MG chewable tablet Chew 81 mg by mouth daily.   atorvastatin 80 MG tablet Commonly known as:  LIPITOR TAKE 1 TABLET DAILY   bisoprolol 10 MG tablet Commonly known as:  ZEBETA TAKE 1 TABLET DAILY   CALCIUM 600-D 600-400 MG-UNIT tablet Generic drug:  Calcium Carbonate-Vitamin D Take 1 tablet by mouth daily.   clonazePAM 0.5 MG tablet Commonly known as:  KLONOPIN Take 1 tablet (0.5 mg total) by mouth 2 (two) times daily as needed for anxiety.   cyanocobalamin 500 MCG tablet Take 500 mcg by mouth daily.   diltiazem 120 MG 24 hr capsule Commonly known as:  TIAZAC Take 1 capsule (120 mg total) by mouth daily.   furosemide 20  MG tablet Commonly known as:  LASIX TAKE 1 TABLET DAILY (MUST SCHEDULE ANNUAL EXAM)   ipratropium-albuterol 0.5-2.5 (3) MG/3ML Soln Commonly known as:  DUONEB Take 3 mLs by nebulization every 6 (six) hours as needed.   levothyroxine 88 MCG tablet Commonly known as:  SYNTHROID, LEVOTHROID TAKE 1 TABLET DAILY   mirtazapine 45 MG tablet Commonly known as:  REMERON TAKE 1 TABLET AT BEDTIME   multivitamin capsule Take 1 capsule by mouth daily.   nitroGLYCERIN 0.4 MG SL tablet Commonly known as:  NITROSTAT Place 1 tablet (0.4 mg total) under the tongue every 5 (five) minutes x 3 doses as needed for chest pain.   omeprazole 20 MG capsule Commonly known as:  PRILOSEC TAKE 1 CAPSULE DAILY   OXYGEN Inhale 2 L into the lungs at bedtime. Try to wean off-O2 sats must be kept at or above 93%   potassium chloride SA 20 MEQ tablet Commonly known as:  KLOR-CON M20 Take 1 tablet (20 mEq total) by mouth daily.   QUEtiapine 25 MG tablet Commonly known as:  SEROQUEL TAKE 1 TABLET AT  BEDTIME   ROBITUSSIN COUGH/CONGESTION PO Take 10 mLs by mouth every 8 (eight) hours as needed (for cough/congestion).   sennosides-docusate sodium 8.6-50 MG tablet Commonly known as:  SENOKOT-S Take 1 tablet by mouth at bedtime. Hold for loose stool   sertraline 25 MG tablet Commonly known as:  ZOLOFT Take 1 tablet (25 mg total) by mouth daily.   UNABLE TO FIND Med Name: Med Pass 2.0 90 mL twice daily   vitamin E 400 UNIT capsule Take 400 Units by mouth daily.       Review of Systems  Constitutional: Negative for activity change, appetite change, chills, fatigue and fever.  HENT: Negative for congestion, rhinorrhea, sinus pain, sinus pressure, sneezing and sore throat.   Eyes: Negative.   Respiratory: Negative for cough, chest tightness, shortness of breath and wheezing.   Cardiovascular: Negative for chest pain, palpitations and leg swelling.  Gastrointestinal: Negative for abdominal distention, abdominal pain, constipation, diarrhea, nausea and vomiting.  Endocrine: Negative.   Genitourinary: Negative for dysuria, flank pain, frequency and urgency.  Musculoskeletal: Positive for gait problem.  Skin: Negative.   Neurological: Negative for dizziness, seizures, syncope, light-headedness and headaches.  Hematological: Does not bruise/bleed easily.  Psychiatric/Behavioral: Negative for agitation, confusion, hallucinations and sleep disturbance. The patient is not nervous/anxious.     Vitals:   09/01/16 1340  BP: 112/65  Pulse: 72  Resp: 16  Temp: 97.3 F (36.3 C)  SpO2: 95%  Weight: 90 lb 6.4 oz (41 kg)  Height: 5\' 4"  (1.626 m)   Body mass index is 15.52 kg/m. Physical Exam  Constitutional: She is oriented to person, place, and time. No distress.  Thin frail elderly vibrant during visit  HENT:  Head: Normocephalic.  Mouth/Throat: Oropharynx is clear and moist. No oropharyngeal exudate.  Eyes: Conjunctivae and EOM are normal. Pupils are equal, round, and reactive  to light. Right eye exhibits no discharge. Left eye exhibits no discharge. No scleral icterus.  Neck: Normal range of motion. No JVD present. No thyromegaly present.  Cardiovascular: Normal rate, regular rhythm, normal heart sounds and intact distal pulses.  Exam reveals no gallop and no friction rub.   No murmur heard. Pulmonary/Chest: Effort normal and breath sounds normal. No respiratory distress. She has no wheezes. She has no rales.  Abdominal: Soft. Bowel sounds are normal. She exhibits no distension. There is no tenderness. There is no rebound  and no guarding.  Genitourinary:  Genitourinary Comments: Continent   Musculoskeletal: She exhibits no edema, tenderness or deformity.  Moves x 4 extremities. unsteady gait   Lymphadenopathy:    She has no cervical adenopathy.  Neurological: She is oriented to person, place, and time.  Skin: Skin is warm and dry. No rash noted. No erythema. No pallor.  Psychiatric: She has a normal mood and affect.    Labs reviewed: Basic Metabolic Panel:  Recent Labs  16/10/96 2343  07/18/16 0333 07/21/16 0519 07/27/16 08/11/16 1335 08/17/16  NA  --   < > 137 140 141 135 140  K  --   < > 3.9 4.1 3.7 4.3 4.5  CL  --   < > 104 99*  --  99*  --   CO2  --   < > 28 35*  --  27  --   GLUCOSE  --   < > 103* 121*  --  104*  --   BUN  --   < > 20 21* 11 11 13   CREATININE  --   < > 0.73 0.64 0.8 1.11* 1.1  CALCIUM  --   < > 8.1* 8.3*  --  8.9  --   MG 1.9  --   --   --   --   --   --   < > = values in this interval not displayed. Liver Function Tests:  Recent Labs  05/22/16 1453 07/15/16 1627 08/11/16 1335  AST 32 48* 40  ALT 16 21 20   ALKPHOS 95 89 78  BILITOT 0.8 1.0 1.2  PROT 7.1 7.0 6.0*  ALBUMIN 3.8 4.1 3.1*   CBC:  Recent Labs  07/15/16 1627  07/17/16 0328 07/21/16 0519 07/27/16 08/11/16 1335 08/17/16  WBC 4.3  < > 8.7 6.2 6.6 7.7 4.6  NEUTROABS 3.2  --   --   --   --  5.2  --   HGB 13.0  < > 12.3 12.0 13.6 14.1 14.7  HCT 38.9  <  > 37.8 37.3 42 43.2 45  MCV 91.5  < > 96.2 95.2  --  93.3  --   PLT 80*  < > 69* 82* 168 142* 118*  < > = values in this interval not displayed. Cardiac Enzymes:  Recent Labs  08/12/16 0052 08/12/16 0813 08/12/16 1126  TROPONINI <0.03 <0.03 0.03*    Recent Labs  07/16/16 1325  GLUCAP 123*   Procedures and Imaging Studies : Dg Chest 2 View  Result Date: 08/11/2016 CLINICAL DATA:  Chest pain today.  History of hypertension. EXAM: CHEST  2 VIEW COMPARISON:  07/07/2016 FINDINGS: Changes from CABG surgery are stable. Cardiac silhouette is top-normal in size. No mediastinal or hilar masses. No evidence of adenopathy. There are prominent bronchovascular markings similar to the prior exam. No evidence of pneumonia or pulmonary edema. Small left pleural effusion similar to the prior study. No right pleural effusion. No pneumothorax. Skeletal structures are demineralized but grossly intact. IMPRESSION: No acute cardiopulmonary disease. Electronically Signed   By: Amie Portland M.D.   On: 08/11/2016 14:56   Assessment/Plan:   1. Unsteady gait  Has worked well with PT/ OT. Will discharge home PT/OT to continue with ROM, Exercise, Gait stability and muscle strengthening. She does not require any DME states has own FWW and oxygen concentrator at home. Fall and safety precautions.  2. Essential hypertension B/p stable. Continue on bisoprolol, diltiazem and Furosemide. Recheck BMP in 1-2 weeks  with PCP.    3. Chronic respiratory failure with hypoxia  Status post hospital admission from 07/15/2016-07/21/2016 with acute on chronic respiratory failure and sepsis with bibasilar streptococcus pneumonia and influenza. Breathing is stable.currently off her oxygen.continue on Duoneb as needed. She has own oxygen concentrator at home.continue to monitor. Recheck CBC/diff in 1-2 weeks with PCP.   4. Hypothyroidism Recent TSH level 0.733 ( 08/12/2016). Continue on levothyroxine 88 mcg Tablet. Monitor TSH level.    5. Mixed hyperlipidemia Continue on heart healthy diet. Continue atorvastatin. Check lipid panel periodically.   6. Depression with anxiety Stable.Sertraline 25 mg Tablet daily.on clonazepam as needed.Continue to monitor for mood changes.   7. Slow transit constipation Current regimen effective.  8. Chronic diastolic CHF (congestive heart failure) Stable.Exam findings negative for wheezing, rales, shortness of breath or edema. Continue on  bisoprolol, diltiazem and Furosemide.on potassium supplement.continue on fluid restriction 1.5 Liters per day. Monitor daily weight.   9. Chronic atrial fibrillation HR regular. Continue on  Bisoprolol and diltiazem.   Patient is being discharged with the following home health services:    -PT/OT for ROM, exercise, gait stability and muscle strengthening  Patient is being discharged with the following durable medical equipment:    - She does not require any DME states has own FWW and oxygen concentrator at home.   Patient has been advised to f/u with their PCP in 1-2 weeks to for a transitions of care visit.Social services at their facility was responsible for arranging this appointment.  Pt was provided with adequate prescriptions of noncontrolled medications to reach the scheduled appointment.For controlled substances, a limited supply was provided as appropriate for the individual patient. If the pt normally receives these medications from a pain clinic or has a contract with another physician, these medications should be received from that clinic or physician only).    Future labs/tests needed:  CBC, BMP in 1-2 weeks PCP

## 2016-09-08 ENCOUNTER — Telehealth: Payer: Self-pay

## 2016-09-08 NOTE — Telephone Encounter (Signed)
V/M left wanting to get meds restarted since pts discharge [facility]. Pt has f/u appt from [facility] on 09/22/16. Pt has plenty of meds until 09/22/16. If [name] has any questions prior to appt she will cb.nothing further needed at this time.

## 2016-09-10 DIAGNOSIS — R2681 Unsteadiness on feet: Secondary | ICD-10-CM | POA: Diagnosis not present

## 2016-09-10 DIAGNOSIS — I110 Hypertensive heart disease with heart failure: Secondary | ICD-10-CM | POA: Diagnosis not present

## 2016-09-10 DIAGNOSIS — J9611 Chronic respiratory failure with hypoxia: Secondary | ICD-10-CM | POA: Diagnosis not present

## 2016-09-10 DIAGNOSIS — I482 Chronic atrial fibrillation: Secondary | ICD-10-CM | POA: Diagnosis not present

## 2016-09-10 DIAGNOSIS — I2510 Atherosclerotic heart disease of native coronary artery without angina pectoris: Secondary | ICD-10-CM | POA: Diagnosis not present

## 2016-09-10 DIAGNOSIS — F418 Other specified anxiety disorders: Secondary | ICD-10-CM | POA: Diagnosis not present

## 2016-09-10 DIAGNOSIS — Z7982 Long term (current) use of aspirin: Secondary | ICD-10-CM | POA: Diagnosis not present

## 2016-09-10 DIAGNOSIS — J449 Chronic obstructive pulmonary disease, unspecified: Secondary | ICD-10-CM | POA: Diagnosis not present

## 2016-09-10 DIAGNOSIS — Z951 Presence of aortocoronary bypass graft: Secondary | ICD-10-CM | POA: Diagnosis not present

## 2016-09-10 DIAGNOSIS — I5032 Chronic diastolic (congestive) heart failure: Secondary | ICD-10-CM | POA: Diagnosis not present

## 2016-09-14 DIAGNOSIS — I482 Chronic atrial fibrillation: Secondary | ICD-10-CM | POA: Diagnosis not present

## 2016-09-14 DIAGNOSIS — I2510 Atherosclerotic heart disease of native coronary artery without angina pectoris: Secondary | ICD-10-CM | POA: Diagnosis not present

## 2016-09-14 DIAGNOSIS — R2681 Unsteadiness on feet: Secondary | ICD-10-CM | POA: Diagnosis not present

## 2016-09-14 DIAGNOSIS — I110 Hypertensive heart disease with heart failure: Secondary | ICD-10-CM | POA: Diagnosis not present

## 2016-09-14 DIAGNOSIS — I5032 Chronic diastolic (congestive) heart failure: Secondary | ICD-10-CM | POA: Diagnosis not present

## 2016-09-14 DIAGNOSIS — J449 Chronic obstructive pulmonary disease, unspecified: Secondary | ICD-10-CM | POA: Diagnosis not present

## 2016-09-15 ENCOUNTER — Telehealth: Payer: Self-pay

## 2016-09-15 ENCOUNTER — Telehealth: Payer: Self-pay | Admitting: Internal Medicine

## 2016-09-15 DIAGNOSIS — I110 Hypertensive heart disease with heart failure: Secondary | ICD-10-CM | POA: Diagnosis not present

## 2016-09-15 DIAGNOSIS — I482 Chronic atrial fibrillation: Secondary | ICD-10-CM | POA: Diagnosis not present

## 2016-09-15 DIAGNOSIS — I2510 Atherosclerotic heart disease of native coronary artery without angina pectoris: Secondary | ICD-10-CM | POA: Diagnosis not present

## 2016-09-15 DIAGNOSIS — I5032 Chronic diastolic (congestive) heart failure: Secondary | ICD-10-CM | POA: Diagnosis not present

## 2016-09-15 DIAGNOSIS — R2681 Unsteadiness on feet: Secondary | ICD-10-CM | POA: Diagnosis not present

## 2016-09-15 DIAGNOSIS — J449 Chronic obstructive pulmonary disease, unspecified: Secondary | ICD-10-CM | POA: Diagnosis not present

## 2016-09-15 NOTE — Telephone Encounter (Signed)
Geraline PT with [facility] left v/m [facility];pt has been d/c from [facility] to home; pt having some hypotension; Geraline request cb with when to hold Zebeta so can give guidance to the family.

## 2016-09-15 NOTE — Telephone Encounter (Signed)
Diane OT with [facility] left v/m [facility] for 4 weeks.

## 2016-09-16 ENCOUNTER — Other Ambulatory Visit: Payer: Self-pay | Admitting: Internal Medicine

## 2016-09-16 DIAGNOSIS — I5032 Chronic diastolic (congestive) heart failure: Secondary | ICD-10-CM | POA: Diagnosis not present

## 2016-09-16 DIAGNOSIS — R2681 Unsteadiness on feet: Secondary | ICD-10-CM | POA: Diagnosis not present

## 2016-09-16 DIAGNOSIS — I2510 Atherosclerotic heart disease of native coronary artery without angina pectoris: Secondary | ICD-10-CM | POA: Diagnosis not present

## 2016-09-16 DIAGNOSIS — I110 Hypertensive heart disease with heart failure: Secondary | ICD-10-CM | POA: Diagnosis not present

## 2016-09-16 DIAGNOSIS — I482 Chronic atrial fibrillation: Secondary | ICD-10-CM | POA: Diagnosis not present

## 2016-09-16 DIAGNOSIS — J449 Chronic obstructive pulmonary disease, unspecified: Secondary | ICD-10-CM | POA: Diagnosis not present

## 2016-09-16 NOTE — Telephone Encounter (Signed)
Ok for OT

## 2016-09-16 NOTE — Telephone Encounter (Signed)
Ok for home health. I need to see her for a hospital followup before I can determine meds that need to be held.

## 2016-09-16 NOTE — Telephone Encounter (Signed)
noted 

## 2016-09-16 NOTE — Telephone Encounter (Signed)
I spoke to daughter [name] and she reports she already bisoprolol last night due to low BP readings  lmovm for Geraline with verbal order

## 2016-09-17 DIAGNOSIS — R2681 Unsteadiness on feet: Secondary | ICD-10-CM | POA: Diagnosis not present

## 2016-09-17 DIAGNOSIS — I2510 Atherosclerotic heart disease of native coronary artery without angina pectoris: Secondary | ICD-10-CM | POA: Diagnosis not present

## 2016-09-17 DIAGNOSIS — I110 Hypertensive heart disease with heart failure: Secondary | ICD-10-CM | POA: Diagnosis not present

## 2016-09-17 DIAGNOSIS — I5032 Chronic diastolic (congestive) heart failure: Secondary | ICD-10-CM | POA: Diagnosis not present

## 2016-09-17 DIAGNOSIS — J449 Chronic obstructive pulmonary disease, unspecified: Secondary | ICD-10-CM | POA: Diagnosis not present

## 2016-09-17 DIAGNOSIS — I482 Chronic atrial fibrillation: Secondary | ICD-10-CM | POA: Diagnosis not present

## 2016-09-18 ENCOUNTER — Other Ambulatory Visit: Payer: Self-pay

## 2016-09-18 ENCOUNTER — Telehealth: Payer: Self-pay

## 2016-09-18 DIAGNOSIS — I5032 Chronic diastolic (congestive) heart failure: Secondary | ICD-10-CM | POA: Diagnosis not present

## 2016-09-18 DIAGNOSIS — J449 Chronic obstructive pulmonary disease, unspecified: Secondary | ICD-10-CM | POA: Diagnosis not present

## 2016-09-18 DIAGNOSIS — I110 Hypertensive heart disease with heart failure: Secondary | ICD-10-CM | POA: Diagnosis not present

## 2016-09-18 DIAGNOSIS — R2681 Unsteadiness on feet: Secondary | ICD-10-CM | POA: Diagnosis not present

## 2016-09-18 DIAGNOSIS — I2510 Atherosclerotic heart disease of native coronary artery without angina pectoris: Secondary | ICD-10-CM | POA: Diagnosis not present

## 2016-09-18 DIAGNOSIS — I482 Chronic atrial fibrillation: Secondary | ICD-10-CM | POA: Diagnosis not present

## 2016-09-18 NOTE — Telephone Encounter (Signed)
Diane OT with [facility] left v/m [facility].

## 2016-09-18 NOTE — Telephone Encounter (Signed)
I do not see where you have filled this before... Please advise if okay to refill

## 2016-09-18 NOTE — Telephone Encounter (Signed)
We will discuss at her visit.

## 2016-09-18 NOTE — Telephone Encounter (Signed)
Will not fill until she makes a hospital followup. This was started in the hospital.

## 2016-09-18 NOTE — Telephone Encounter (Signed)
Ok for OT

## 2016-09-18 NOTE — Telephone Encounter (Signed)
She already has appt scheduled for next week, Tues

## 2016-09-18 NOTE — Telephone Encounter (Signed)
Verbal order given  

## 2016-09-21 DIAGNOSIS — I110 Hypertensive heart disease with heart failure: Secondary | ICD-10-CM | POA: Diagnosis not present

## 2016-09-21 DIAGNOSIS — I482 Chronic atrial fibrillation: Secondary | ICD-10-CM | POA: Diagnosis not present

## 2016-09-21 DIAGNOSIS — I2510 Atherosclerotic heart disease of native coronary artery without angina pectoris: Secondary | ICD-10-CM | POA: Diagnosis not present

## 2016-09-21 DIAGNOSIS — R2681 Unsteadiness on feet: Secondary | ICD-10-CM | POA: Diagnosis not present

## 2016-09-21 DIAGNOSIS — I5032 Chronic diastolic (congestive) heart failure: Secondary | ICD-10-CM | POA: Diagnosis not present

## 2016-09-21 DIAGNOSIS — J449 Chronic obstructive pulmonary disease, unspecified: Secondary | ICD-10-CM | POA: Diagnosis not present

## 2016-09-22 ENCOUNTER — Encounter: Payer: Self-pay | Admitting: Internal Medicine

## 2016-09-22 ENCOUNTER — Ambulatory Visit (INDEPENDENT_AMBULATORY_CARE_PROVIDER_SITE_OTHER): Payer: Medicare Other | Admitting: Internal Medicine

## 2016-09-22 VITALS — BP 102/68 | HR 110 | Temp 97.3°F | Wt 91.0 lb

## 2016-09-22 DIAGNOSIS — R531 Weakness: Secondary | ICD-10-CM | POA: Diagnosis not present

## 2016-09-22 DIAGNOSIS — I2510 Atherosclerotic heart disease of native coronary artery without angina pectoris: Secondary | ICD-10-CM | POA: Diagnosis not present

## 2016-09-22 DIAGNOSIS — J111 Influenza due to unidentified influenza virus with other respiratory manifestations: Secondary | ICD-10-CM | POA: Diagnosis not present

## 2016-09-22 DIAGNOSIS — I951 Orthostatic hypotension: Secondary | ICD-10-CM | POA: Diagnosis not present

## 2016-09-22 DIAGNOSIS — R2681 Unsteadiness on feet: Secondary | ICD-10-CM | POA: Diagnosis not present

## 2016-09-22 DIAGNOSIS — J189 Pneumonia, unspecified organism: Secondary | ICD-10-CM | POA: Diagnosis not present

## 2016-09-22 DIAGNOSIS — I110 Hypertensive heart disease with heart failure: Secondary | ICD-10-CM | POA: Diagnosis not present

## 2016-09-22 DIAGNOSIS — F419 Anxiety disorder, unspecified: Secondary | ICD-10-CM | POA: Diagnosis not present

## 2016-09-22 DIAGNOSIS — R079 Chest pain, unspecified: Secondary | ICD-10-CM

## 2016-09-22 DIAGNOSIS — I5032 Chronic diastolic (congestive) heart failure: Secondary | ICD-10-CM | POA: Diagnosis not present

## 2016-09-22 DIAGNOSIS — I482 Chronic atrial fibrillation: Secondary | ICD-10-CM | POA: Diagnosis not present

## 2016-09-22 DIAGNOSIS — J449 Chronic obstructive pulmonary disease, unspecified: Secondary | ICD-10-CM | POA: Diagnosis not present

## 2016-09-22 MED ORDER — SERTRALINE HCL 25 MG PO TABS: 50.0000 mg | ORAL_TABLET | Freq: Every day | ORAL | 2 refills | Status: DC

## 2016-09-22 NOTE — Progress Notes (Signed)
Subjective:    Patient ID: Jasmine [name], female    DOB: [birth year], 83 y.o.   MRN: [id number]  HPI  Pt presents to the clinic today for rehab followup. She was admitted 3/28-4/3 with right sided strep pneumonia, and influenza. She was treated with IV antibiotics, and eventually transitioned to oral Levaquin. She was also treated with Tamiflu. She was started on oxygen at night, at discharge. Upon discharge, she went to [facility]. She went back to the ER 4/25, with c/o chest pain. ECG showed Afib otherwise unremarkable. Tropinins were negative. Chest xray was clear. She was admitted for observation but discharged the next day with advice to follow up with Dr. Allyson Sabal in 1 week. She was given a RX for Nitrostat but he daughter reports she has not used it. She went back to [facility] for a few more days of rehab, then was discharged back home with her daughter, who is accompanying her today..  Her daughter reports her blood pressure is low. She has dizziness with standing. Her BP's by home health are 105/60, 80/55. Her daughter reports she has not taken her Bisoprolol in over a week. She continues to take Diltiazem and Lasix.  CAD: She continues on ASA and Lipitor.  Anxiety and Depression: Managed on Seroquel, Remeron. Her daughter reports she has not had any Clonazepam while she was in rehab. She has noticed that she is ferry jittery and anxious at times.   Diastolic CHF: Stable on Lasix, Potassium.  Afib: Managed on Diltiazem and ASA  Constipation: Bowels moving normally with Senna.  Hypothyroid: Managed on Synthroid.  Review of Systems      Past Medical History:  Diagnosis Date  . Anxiety   . Atrial fibrillation with RVR (HCC)   . Barrett's esophagus   . CAD (coronary artery disease) of artery bypass graft 1995  . Fracture of right shoulder 02/2008  . GERD (gastroesophageal reflux disease)   . Hemorrhoids, external   . Hiatal hernia   . History of colon polyps   .  Hyperlipidemia   . Hypertension   . Hypothyroidism   . Insomnia due to medical condition   . Left wrist fracture 09/2007  . Low back pain   . Syncope     Current Outpatient Prescriptions  Medication Sig Dispense Refill  . acetaminophen (TYLENOL) 500 MG tablet Take 1 tablet (500 mg total) by mouth every 6 (six) hours as needed for mild pain. For pain 30 tablet 0  . aspirin 81 MG chewable tablet Chew 81 mg by mouth daily.    Marland Kitchen atorvastatin (LIPITOR) 80 MG tablet TAKE 1 TABLET DAILY 90 tablet 1  . bisoprolol (ZEBETA) 10 MG tablet TAKE 1 TABLET DAILY 90 tablet 0  . Calcium Carbonate-Vitamin D (CALCIUM 600-D) 600-400 MG-UNIT per tablet Take 1 tablet by mouth daily.     . clonazePAM (KLONOPIN) 0.5 MG tablet Take 1 tablet (0.5 mg total) by mouth 2 (two) times daily as needed for anxiety. 10 tablet 0  . cyanocobalamin 500 MCG tablet Take 500 mcg by mouth daily.      Marland Kitchen Dextromethorphan-Guaifenesin (ROBITUSSIN COUGH/CONGESTION PO) Take 10 mLs by mouth every 8 (eight) hours as needed (for cough/congestion).     Marland Kitchen diltiazem (TIAZAC) 120 MG 24 hr capsule Take 1 capsule (120 mg total) by mouth daily. 90 capsule 3  . furosemide (LASIX) 20 MG tablet TAKE 1 TABLET DAILY (MUST SCHEDULE ANNUAL EXAM) 90 tablet 0  . ipratropium-albuterol (DUONEB) 0.5-2.5 (3) MG/3ML  SOLN Take 3 mLs by nebulization every 6 (six) hours as needed. 360 mL 0  . KLOR-CON M20 20 MEQ tablet TAKE 1 TABLET DAILY 90 tablet 1  . levothyroxine (SYNTHROID, LEVOTHROID) 88 MCG tablet TAKE 1 TABLET DAILY 90 tablet 3  . mirtazapine (REMERON) 45 MG tablet TAKE 1 TABLET AT BEDTIME 90 tablet 1  . Multiple Vitamin (MULTIVITAMIN) capsule Take 1 capsule by mouth daily.      . nitroGLYCERIN (NITROSTAT) 0.4 MG SL tablet Place 1 tablet (0.4 mg total) under the tongue every 5 (five) minutes x 3 doses as needed for chest pain. 25 tablet 12  . omeprazole (PRILOSEC) 20 MG capsule TAKE 1 CAPSULE DAILY 90 capsule 3  . OXYGEN Inhale 2 L into the lungs at  bedtime. Try to wean off-O2 sats must be kept at or above 93%    . QUEtiapine (SEROQUEL) 25 MG tablet TAKE 1 TABLET AT BEDTIME 90 tablet 1  . sennosides-docusate sodium (SENOKOT-S) 8.6-50 MG tablet Take 1 tablet by mouth at bedtime. Hold for loose stool    . sertraline (ZOLOFT) 25 MG tablet Take 1 tablet (25 mg total) by mouth daily.    Marland Kitchen. UNABLE TO FIND Med Name: Med Pass 2.0 90 mL twice daily    . vitamin E 400 UNIT capsule Take 400 Units by mouth daily.       No current facility-administered medications for this visit.     Allergies  Allergen Reactions  . Codeine     Reaction unknown to patient  . Meperidine Hcl     Reaction unknown to patient  . Oxycodone-Acetaminophen     Reaction unknown to patient    Family History  Problem Relation Age of Onset  . Heart disease Mother   . Heart disease Father   . Diabetes Son     Social History   Social History  . Marital status: Widowed    Spouse name: N/A  . Number of children: N/A  . Years of education: N/A   Occupational History  . Not on file.   Social History Main Topics  . Smoking status: Former Smoker    Quit date: 04/20/1968  . Smokeless tobacco: Never Used     Comment: QUIT IN 1970  . Alcohol use No     Comment: STOPPED IN 1970  . Drug use: No  . Sexual activity: Not Currently   Other Topics Concern  . Not on file   Social History Narrative  . No narrative on file     Constitutional:P Pt reports fatigue and weight loss. Denies fever, malaise, headache.  Respiratory: Pt reports dyspnea on exertion. Denies difficulty breathing, cough or sputum production.   Cardiovascular: Denies chest pain, chest tightness, palpitations or swelling in the hands or feet.  Gastrointestinal: Pt reports intermittent constipation. Denies abdominal pain, bloating, diarrhea or blood in the stool.  GU: Denies urgency, frequency, pain with urination, burning sensation, blood in urine, odor or discharge. Musculoskeletal: Pt reports  weakness. Denies decrease in range of motion, muscle pain or joint pain and swelling.  Neurological: Pt reports dizziness, difficulty with memory and balance. Denies difficulty with speech or problems with coordination.  Psych: Pt reports anxiety. Denies depression, SI/HI.  No other specific complaints in a complete review of systems (except as listed in HPI above).  Objective:   Physical Exam   BP 102/68   Pulse (!) 110   Temp 97.3 F (36.3 C) (Oral)   Wt 91 lb (41.3 kg)  SpO2 97%   BMI 15.62 kg/m  Wt Readings from Last 3 Encounters:  09/22/16 91 lb (41.3 kg)  09/01/16 90 lb 6.4 oz (41 kg)  08/13/16 96 lb 3.2 oz (43.6 kg)    General: Appears her stated age, chronically ill appearing, in NAD. Neck:  Neck supple, trachea midline. No masses, lumps present.  Cardiovascular: Tachycardic with irregular rhythm.  Pulmonary/Chest: Normal effort and positive vesicular breath sounds. No respiratory distress. No wheezes, rales or ronchi noted.  Abdomen: Soft and nontender. Normal bowel sounds.  Musculoskeletal: She is weak today. Using a walker for assistance. We had her use one of the office wheelchairs to leave the building.   Neurological: Alert and oriented. Decreased short term memory. Psychiatric: Mildly anxious appearing today.  BMET    Component Value Date/Time   NA 140 08/17/2016   K 4.5 08/17/2016   CL 99 (L) 08/11/2016 1335   CO2 27 08/11/2016 1335   GLUCOSE 104 (H) 08/11/2016 1335   BUN 13 08/17/2016   CREATININE 1.1 08/17/2016   CREATININE 1.11 (H) 08/11/2016 1335   CREATININE 1.31 (H) 04/19/2015 1624   CALCIUM 8.9 08/11/2016 1335   GFRNONAA 42 (L) 08/11/2016 1335   GFRAA 49 (L) 08/11/2016 1335    Lipid Panel     Component Value Date/Time   CHOL 120 11/20/2015 1630   TRIG 53.0 11/20/2015 1630   HDL 58.90 11/20/2015 1630   CHOLHDL 2 11/20/2015 1630   VLDL 10.6 11/20/2015 1630   LDLCALC 51 11/20/2015 1630    CBC    Component Value Date/Time   WBC 4.6  08/17/2016   WBC 7.7 08/11/2016 1335   RBC 4.63 08/11/2016 1335   HGB 14.7 08/17/2016   HCT 45 08/17/2016   PLT 118 (A) 08/17/2016   MCV 93.3 08/11/2016 1335   MCH 30.5 08/11/2016 1335   MCHC 32.6 08/11/2016 1335   RDW 14.6 08/11/2016 1335   LYMPHSABS 1.8 08/11/2016 1335   MONOABS 0.7 08/11/2016 1335   EOSABS 0.1 08/11/2016 1335   BASOSABS 0.0 08/11/2016 1335    Hgb A1C Lab Results  Component Value Date   HGBA1C 6.0 (H) 01/23/2015           Assessment & Plan:   [name] Followup for Weakness, CAP, Influenza, Chest Pain:  Hospital notes, labs and imagings reviewed [employer] notes reviewed She will continue to rest at home Continue to work with PT  Orthostatic Hypotension:  This is her biggest issues right now I will reach out to Dr. Allyson Sabal for advice on management of this Shawna Orleans will call you with the plan later in the week  Anxiety:  She is off Clonazepam Increase Zoloft to 50 mg daily, eRx sent to pharmacy  RTC in 2 months for follow up of chronic conditions Emori Kamau, NP

## 2016-09-23 ENCOUNTER — Telehealth: Payer: Self-pay

## 2016-09-23 DIAGNOSIS — I482 Chronic atrial fibrillation: Secondary | ICD-10-CM | POA: Diagnosis not present

## 2016-09-23 DIAGNOSIS — R2681 Unsteadiness on feet: Secondary | ICD-10-CM | POA: Diagnosis not present

## 2016-09-23 DIAGNOSIS — I5032 Chronic diastolic (congestive) heart failure: Secondary | ICD-10-CM | POA: Diagnosis not present

## 2016-09-23 DIAGNOSIS — I2510 Atherosclerotic heart disease of native coronary artery without angina pectoris: Secondary | ICD-10-CM | POA: Diagnosis not present

## 2016-09-23 DIAGNOSIS — J449 Chronic obstructive pulmonary disease, unspecified: Secondary | ICD-10-CM | POA: Diagnosis not present

## 2016-09-23 DIAGNOSIS — I110 Hypertensive heart disease with heart failure: Secondary | ICD-10-CM | POA: Diagnosis not present

## 2016-09-23 NOTE — Telephone Encounter (Signed)
Jasmine [name] is aware as instructed and expressed understanding.... Jasmine [name] also wants to know if it is okay for pt to take Zoloft at night vs in AM... Please advise

## 2016-09-23 NOTE — Telephone Encounter (Signed)
Yes, fine to take Zoloft in pm

## 2016-09-23 NOTE — Patient Instructions (Signed)
Orthostatic Hypotension Orthostatic hypotension is a sudden drop in blood pressure that happens when you quickly change positions, such as when you get up from a seated or lying position. Blood pressure is a measurement of how strongly, or weakly, your blood is pressing against the walls of your arteries. Arteries are blood vessels that carry blood from your heart throughout your body. When blood pressure is too low, you may not get enough blood to your brain or to the rest of your organs. This can cause weakness, light-headedness, rapid heartbeat, and fainting. This can last for just a few seconds or for up to a few minutes. Orthostatic hypotension is usually not a serious problem. However, if it happens frequently or gets worse, it may be a sign of something more serious. What are the causes? This condition may be caused by:  Sudden changes in posture, such as standing up quickly after you have been sitting or lying down.  Blood loss.  Loss of body fluids (dehydration).  Heart problems.  Hormone (endocrine) problems.  Pregnancy.  Severe infection.  Lack of certain nutrients.  Severe allergic reactions (anaphylaxis).  Certain medicines, such as blood pressure medicine or medicines that make the body lose excess fluids (diuretics). Sometimes, this condition can be caused by not taking medicine as directed, such as taking too much of a certain medicine.  What increases the risk? Certain factors can make you more likely to develop orthostatic hypotension, including:  Age. Risk increases as you get older.  Conditions that affect the heart or the central nervous system.  Taking certain medicines, such as blood pressure medicine or diuretics.  Being pregnant.  What are the signs or symptoms? Symptoms of this condition may include:  Weakness.  Light-headedness.  Dizziness.  Blurred vision.  Fatigue.  Rapid heartbeat.  Fainting, in severe cases.  How is this  diagnosed? This condition is diagnosed based on:  Your medical history.  Your symptoms.  Your blood pressure measurement. Your health care provider will check your blood pressure when you are: ? Lying down. ? Sitting. ? Standing.  A blood pressure reading is recorded as two numbers, such as "120 over 80" (or 120/80). The first ("top") number is called the systolic pressure. It is a measure of the pressure in your arteries as your heart beats. The second ("bottom") number is called the diastolic pressure. It is a measure of the pressure in your arteries when your heart relaxes between beats. Blood pressure is measured in a unit called mm Hg. Healthy blood pressure for adults is 120/80. If your blood pressure is below 90/60, you may be diagnosed with hypotension. Other information or tests that may be used to diagnose orthostatic hypotension include:  Your other vital signs, such as your heart rate and temperature.  Blood tests.  Tilt table test. For this test, you will be safely secured to a table that moves you from a lying position to an upright position. Your heart rhythm and blood pressure will be monitored during the test.  How is this treated? Treatment for this condition may include:  Changing your diet. This may involve eating more salt (sodium) or drinking more water.  Taking medicines to raise your blood pressure.  Changing the dosage of certain medicines you are taking that might be lowering your blood pressure.  Wearing compression stockings. These stockings help to prevent blood clots and reduce swelling in your legs.  In some cases, you may need to go to the hospital for:    Fluid replacement. This means you will receive fluids through an IV tube.  Blood replacement. This means you will receive donated blood through an IV tube (transfusion).  Treating an infection or heart problems, if this applies.  Monitoring. You may need to be monitored while medicines that you  are taking wear off.  Follow these instructions at home: Eating and drinking   Drink enough fluid to keep your urine clear or pale yellow.  Eat a healthy diet and follow instructions from your health care provider about eating or drinking restrictions. A healthy diet includes: ? Fresh fruits and vegetables. ? Whole grains. ? Lean meats. ? Low-fat dairy products.  Eat extra salt only as directed. Do not add extra salt to your diet unless your health care provider told you to do that.  Eat frequent, small meals.  Avoid standing up suddenly after eating. Medicines  Take over-the-counter and prescription medicines only as told by your health care provider. ? Follow instructions from your health care provider about changing the dosage of your current medicines, if this applies. ? Do not stop or adjust any of your medicines on your own. General instructions  Wear compression stockings as told by your health care provider.  Get up slowly from lying down or sitting positions. This gives your blood pressure a chance to adjust.  Avoid hot showers and excessive heat as directed by your health care provider.  Return to your normal activities as told by your health care provider. Ask your health care provider what activities are safe for you.  Do not use any products that contain nicotine or tobacco, such as cigarettes and e-cigarettes. If you need help quitting, ask your health care provider.  Keep all follow-up visits as told by your health care provider. This is important. Contact a health care provider if:  You vomit.  You have diarrhea.  You have a fever for more than 2-3 days.  You feel more thirsty than usual.  You feel weak and tired. Get help right away if:  You have chest pain.  You have a fast or irregular heartbeat.  You develop numbness in any part of your body.  You cannot move your arms or your legs.  You have trouble speaking.  You become sweaty or feel  lightheaded.  You faint.  You feel short of breath.  You have trouble staying awake.  You feel confused. This information is not intended to replace advice given to you by your health care provider. Make sure you discuss any questions you have with your health care provider. Document Released: 03/27/2002 Document Revised: 12/24/2015 Document Reviewed: 09/27/2015 Elsevier Interactive Patient Education  2018 [employer]  

## 2016-09-23 NOTE — Telephone Encounter (Signed)
-----   Message from Lorre Munroeegina W Baity, NP sent at 09/23/2016  3:45 PM EDT ----- Regarding: FW: hypotension Call daughter, I reached out to Dr. Allyson SabalBerry for advice about her blood pressure and all he said was he wants them to make an appt with him. Please let daughter know. ----- Message ----- From: Runell GessBerry, Jonathan J, MD Sent: 09/22/2016   4:49 PM To: Lorre Munroeegina W Baity, NP, Evans Lanceaylor W Stover Subject: RE: hypotension                                Rene KocherRegina, we would be happy to see her back to reevaluate ----- Message ----- From: Lorre MunroeBaity, Regina W, NP Sent: 09/22/2016   2:50 PM To: Runell GessJonathan J Berry, MD Subject: hypotension                                    Dr. Allyson SabalBerry,  This pt recently released from hospital for chest pain. Test did not indicate that it was a heart issue. She [place] which she hasn't taken but has been having issues with orthostasis since discharge. Hx CHF and AFib. Current RX Bisoprolol, Diltiazem and Lasix. Daughter stopped the Bisoprolol. HR today 110 irregular. Blood pressures have been as low 74/55, 106/58. Pt is dizzy at times. I would like your thoughts about management of her Afib, HTN. ? Stop Diltiazem or start Fludrocortisone. I do think she needs to be on a low dose beta blocker. Thanks for your help.

## 2016-09-24 DIAGNOSIS — R2681 Unsteadiness on feet: Secondary | ICD-10-CM | POA: Diagnosis not present

## 2016-09-24 DIAGNOSIS — J449 Chronic obstructive pulmonary disease, unspecified: Secondary | ICD-10-CM | POA: Diagnosis not present

## 2016-09-24 DIAGNOSIS — I110 Hypertensive heart disease with heart failure: Secondary | ICD-10-CM | POA: Diagnosis not present

## 2016-09-24 DIAGNOSIS — I482 Chronic atrial fibrillation: Secondary | ICD-10-CM | POA: Diagnosis not present

## 2016-09-24 DIAGNOSIS — I5032 Chronic diastolic (congestive) heart failure: Secondary | ICD-10-CM | POA: Diagnosis not present

## 2016-09-24 DIAGNOSIS — I2510 Atherosclerotic heart disease of native coronary artery without angina pectoris: Secondary | ICD-10-CM | POA: Diagnosis not present

## 2016-09-24 NOTE — Telephone Encounter (Signed)
Pt's daughter is aware as instructed

## 2016-09-28 ENCOUNTER — Other Ambulatory Visit: Payer: Self-pay

## 2016-09-28 MED ORDER — SERTRALINE HCL 25 MG PO TABS: 50.0000 mg | ORAL_TABLET | Freq: Every day | ORAL | 0 refills | Status: DC

## 2016-09-28 NOTE — Telephone Encounter (Signed)
Jasmine [name] (DPR signed) request # 20 of sertraline [facility] while waiting on mail order pharmacy delivery. Pt see 09/22/16. Refill done and Jasmine [name] voiced understanding.

## 2016-09-29 ENCOUNTER — Telehealth: Payer: Self-pay | Admitting: *Deleted

## 2016-09-29 DIAGNOSIS — J449 Chronic obstructive pulmonary disease, unspecified: Secondary | ICD-10-CM | POA: Diagnosis not present

## 2016-09-29 DIAGNOSIS — R2681 Unsteadiness on feet: Secondary | ICD-10-CM | POA: Diagnosis not present

## 2016-09-29 DIAGNOSIS — I2510 Atherosclerotic heart disease of native coronary artery without angina pectoris: Secondary | ICD-10-CM | POA: Diagnosis not present

## 2016-09-29 DIAGNOSIS — I5032 Chronic diastolic (congestive) heart failure: Secondary | ICD-10-CM | POA: Diagnosis not present

## 2016-09-29 DIAGNOSIS — I110 Hypertensive heart disease with heart failure: Secondary | ICD-10-CM | POA: Diagnosis not present

## 2016-09-29 DIAGNOSIS — I482 Chronic atrial fibrillation: Secondary | ICD-10-CM | POA: Diagnosis not present

## 2016-09-29 NOTE — Telephone Encounter (Signed)
[facility] who states she saw pt today, and her BP was 108/52 and it dropped to 78/50 when she requested the pt stand. Pt has also been c/o dizziness. Both are ongoing issues but she wanted to send as FYI to Dr Dayton MartesAron

## 2016-09-29 NOTE — Telephone Encounter (Signed)
I am aware. I discussed this with her cardiologist and he advised for Jasmine [name] to make a follow up appt with him.

## 2016-09-30 ENCOUNTER — Encounter: Payer: Self-pay | Admitting: Cardiovascular Disease

## 2016-09-30 ENCOUNTER — Ambulatory Visit (INDEPENDENT_AMBULATORY_CARE_PROVIDER_SITE_OTHER): Payer: Medicare Other | Admitting: Cardiovascular Disease

## 2016-09-30 DIAGNOSIS — I482 Chronic atrial fibrillation, unspecified: Secondary | ICD-10-CM

## 2016-09-30 DIAGNOSIS — I10 Essential (primary) hypertension: Secondary | ICD-10-CM

## 2016-09-30 DIAGNOSIS — E7800 Pure hypercholesterolemia, unspecified: Secondary | ICD-10-CM

## 2016-09-30 DIAGNOSIS — R2681 Unsteadiness on feet: Secondary | ICD-10-CM | POA: Diagnosis not present

## 2016-09-30 DIAGNOSIS — I110 Hypertensive heart disease with heart failure: Secondary | ICD-10-CM | POA: Diagnosis not present

## 2016-09-30 DIAGNOSIS — I2510 Atherosclerotic heart disease of native coronary artery without angina pectoris: Secondary | ICD-10-CM

## 2016-09-30 DIAGNOSIS — J449 Chronic obstructive pulmonary disease, unspecified: Secondary | ICD-10-CM | POA: Diagnosis not present

## 2016-09-30 DIAGNOSIS — I5032 Chronic diastolic (congestive) heart failure: Secondary | ICD-10-CM | POA: Diagnosis not present

## 2016-09-30 NOTE — Telephone Encounter (Signed)
Spoke to Diane and she is aware

## 2016-09-30 NOTE — Patient Instructions (Signed)
We request that you follow-up in: 6 months with an extender and in 12 months with Dr Berry  You will receive a reminder letter in the mail two months in advance. If you don't receive a letter, please call our office to schedule the follow-up appointment.   

## 2016-09-30 NOTE — Assessment & Plan Note (Signed)
History of CAD status post bypass grafting in 1997. I catheterized her 12/09/03 revealing patent grafts and normal LV function. Last Myoview performed 04/03/11 was unremarkable. She does get occasional chest pain and was recently admitted to the hospital for rule out myocardial infarction 08/11/16. She's been asymptomatic since.

## 2016-09-30 NOTE — Assessment & Plan Note (Signed)
History of chronic atrial fibrillation on calcium channel blocker not on oral hydration because of fall risk.

## 2016-09-30 NOTE — Progress Notes (Signed)
09/30/2016 Griffin Dakinmma A Pineau   02/21/1923  [id number]  Primary Physician Sampson SiBaity, Salvadore Oxfordegina W, NP Primary Cardiologist: Runell GessJonathan J Cono Gebhard MD Roseanne RenoFACP, FACC, FAHA, FSCAI  HPI:  The patient is an 83 year old, thin and frail-appearing, widowed Caucasian female, mother of 3, grandmother to 4712 grandchildren who is accompanied by one of her daughters today. I saw her in the office 01/29/16.Marland Kitchen. She has a history of CAD and PVOD. She had coronary artery bypass grafting in 1997 as well as carotid endarterectomy at that time. I catheterized her December 09, 2003, revealing patent grafts and normal LV function. Her other problems include hypertension and hyperlipidemia. She developed A-fib with RVR and we have been adjusting her medications. I put her on Xarelto for stroke prophylaxis and she spontaneously converted to sinus rhythm. Her last stress test performed April 03, 2011, was unremarkable. She is otherwise asymptomatic except for dizziness.. The Xarelto was discontinued because of fall risk. Since I saw her in the office October 2017 she has done well until recently when she was admitted on 08/11/16 with chest pain. She was kept overnight and discharged home the next day by Dr. Antoine PocheHochrein . Her enzymes were negative and there is no other evidence of acute ischemic changes.   Current Outpatient Prescriptions  Medication Sig Dispense Refill  . acetaminophen (TYLENOL) 500 MG tablet Take 1 tablet (500 mg total) by mouth every 6 (six) hours as needed for mild pain. For pain 30 tablet 0  . aspirin 81 MG chewable tablet Chew 81 mg by mouth daily.    Marland Kitchen. atorvastatin (LIPITOR) 80 MG tablet TAKE 1 TABLET DAILY 90 tablet 1  . bisoprolol (ZEBETA) 10 MG tablet TAKE 1 TABLET DAILY 90 tablet 0  . Calcium Carbonate-Vitamin D (CALCIUM 600-D) 600-400 MG-UNIT per tablet Take 1 tablet by mouth daily.     . cyanocobalamin 500 MCG tablet Take 500 mcg by mouth daily.      Marland Kitchen. Dextromethorphan-Guaifenesin (ROBITUSSIN COUGH/CONGESTION PO)  Take 10 mLs by mouth every 8 (eight) hours as needed (for cough/congestion).     Marland Kitchen. diltiazem (TIAZAC) 120 MG 24 hr capsule Take 1 capsule (120 mg total) by mouth daily. 90 capsule 3  . furosemide (LASIX) 20 MG tablet TAKE 1 TABLET DAILY (MUST SCHEDULE ANNUAL EXAM) 90 tablet 0  . ipratropium-albuterol (DUONEB) 0.5-2.5 (3) MG/3ML SOLN Take 3 mLs by nebulization every 6 (six) hours as needed. 360 mL 0  . KLOR-CON M20 20 MEQ tablet TAKE 1 TABLET DAILY 90 tablet 1  . levothyroxine (SYNTHROID, LEVOTHROID) 88 MCG tablet TAKE 1 TABLET DAILY 90 tablet 3  . mirtazapine (REMERON) 45 MG tablet TAKE 1 TABLET AT BEDTIME 90 tablet 1  . Multiple Vitamin (MULTIVITAMIN) capsule Take 1 capsule by mouth daily.      . nitroGLYCERIN (NITROSTAT) 0.4 MG SL tablet Place 1 tablet (0.4 mg total) under the tongue every 5 (five) minutes x 3 doses as needed for chest pain. 25 tablet 12  . omeprazole (PRILOSEC) 20 MG capsule TAKE 1 CAPSULE DAILY 90 capsule 3  . OXYGEN Inhale 2 L into the lungs at bedtime. Try to wean off-O2 sats must be kept at or above 93%    . QUEtiapine (SEROQUEL) 25 MG tablet TAKE 1 TABLET AT BEDTIME 90 tablet 1  . sennosides-docusate sodium (SENOKOT-S) 8.6-50 MG tablet Take 1 tablet by mouth at bedtime. Hold for loose stool    . sertraline (ZOLOFT) 25 MG tablet Take 2 tablets (50 mg total) by mouth daily. 20  tablet 0  . UNABLE TO FIND Med Name: Med Pass 2.0 90 mL twice daily    . vitamin E 400 UNIT capsule Take 400 Units by mouth daily.       No current facility-administered medications for this visit.     Allergies  Allergen Reactions  . Codeine     Reaction unknown to patient  . Meperidine Hcl     Reaction unknown to patient  . Oxycodone-Acetaminophen     Reaction unknown to patient    Social History   Social History  . Marital status: Widowed    Spouse name: N/A  . Number of children: N/A  . Years of education: N/A   Occupational History  . Not on file.   Social History Main Topics   . Smoking status: Former Smoker    Quit date: 04/20/1968  . Smokeless tobacco: Never Used     Comment: QUIT IN 1970  . Alcohol use No     Comment: STOPPED IN 1970  . Drug use: No  . Sexual activity: Not Currently   Other Topics Concern  . Not on file   Social History Narrative  . No narrative on file     Review of Systems: General: negative for chills, fever, night sweats or weight changes.  Cardiovascular: negative for chest pain, dyspnea on exertion, edema, orthopnea, palpitations, paroxysmal nocturnal dyspnea or shortness of breath Dermatological: negative for rash Respiratory: negative for cough or wheezing Urologic: negative for hematuria Abdominal: negative for nausea, vomiting, diarrhea, bright red blood per rectum, melena, or hematemesis Neurologic: negative for visual changes, syncope, or dizziness All other systems reviewed and are otherwise negative except as noted above.    Blood pressure 108/60, pulse (!) 106, height 5\' 4"  (1.626 m), weight 90 lb (40.8 kg).  General appearance: alert and no distress Neck: no adenopathy, no carotid bruit, no JVD, supple, symmetrical, trachea midline and thyroid not enlarged, symmetric, no tenderness/mass/nodules Lungs: clear to auscultation bilaterally Heart: irregularly irregular rhythm  EKG atrial fibrillation with a ventricular response of 106 and septal Q waves. I personally reviewed this EKG.  ASSESSMENT AND PLAN:   HLD (hyperlipidemia) History of hyperlipidemia on statin therapy with recent lipid profile performed 11/20/15 revealed total cholesterol 120, LDL 51 and HDL of 58.  CAD - CABG '97, cath OK 2005, Myoview low risk 12/12 History of CAD status post bypass grafting in 1997. I catheterized her 12/09/03 revealing patent grafts and normal LV function. Last Myoview performed 04/03/11 was unremarkable. She does get occasional chest pain and was recently admitted to the hospital for rule out myocardial infarction 08/11/16.  She's been asymptomatic since.  HTN (hypertension) History of essential hypertension blood pressure measured 108/60. She was on Zebeta which has been held because of relative hypotension as well as diltiazem. Continue current meds at current dosing  Chronic atrial fibrillation (HCC) History of chronic atrial fibrillation on calcium channel blocker not on oral hydration because of fall risk.      Runell Gess MD FACP,FACC,FAHA, [facility] 09/30/2016 4:23 PM

## 2016-09-30 NOTE — Assessment & Plan Note (Signed)
History of hyperlipidemia on statin therapy with recent lipid profile performed 11/20/15 revealed total cholesterol 120, LDL 51 and HDL of 58.

## 2016-09-30 NOTE — Assessment & Plan Note (Signed)
History of essential hypertension blood pressure measured 108/60. She was on Zebeta which has been held because of relative hypotension as well as diltiazem. Continue current meds at current dosing

## 2016-10-01 DIAGNOSIS — I110 Hypertensive heart disease with heart failure: Secondary | ICD-10-CM | POA: Diagnosis not present

## 2016-10-01 DIAGNOSIS — I482 Chronic atrial fibrillation: Secondary | ICD-10-CM | POA: Diagnosis not present

## 2016-10-01 DIAGNOSIS — J449 Chronic obstructive pulmonary disease, unspecified: Secondary | ICD-10-CM | POA: Diagnosis not present

## 2016-10-01 DIAGNOSIS — I5032 Chronic diastolic (congestive) heart failure: Secondary | ICD-10-CM | POA: Diagnosis not present

## 2016-10-01 DIAGNOSIS — R2681 Unsteadiness on feet: Secondary | ICD-10-CM | POA: Diagnosis not present

## 2016-10-01 DIAGNOSIS — I2510 Atherosclerotic heart disease of native coronary artery without angina pectoris: Secondary | ICD-10-CM | POA: Diagnosis not present

## 2016-10-02 DIAGNOSIS — I110 Hypertensive heart disease with heart failure: Secondary | ICD-10-CM | POA: Diagnosis not present

## 2016-10-02 DIAGNOSIS — J449 Chronic obstructive pulmonary disease, unspecified: Secondary | ICD-10-CM | POA: Diagnosis not present

## 2016-10-02 DIAGNOSIS — I5032 Chronic diastolic (congestive) heart failure: Secondary | ICD-10-CM | POA: Diagnosis not present

## 2016-10-02 DIAGNOSIS — I2510 Atherosclerotic heart disease of native coronary artery without angina pectoris: Secondary | ICD-10-CM | POA: Diagnosis not present

## 2016-10-02 DIAGNOSIS — I482 Chronic atrial fibrillation: Secondary | ICD-10-CM | POA: Diagnosis not present

## 2016-10-02 DIAGNOSIS — R2681 Unsteadiness on feet: Secondary | ICD-10-CM | POA: Diagnosis not present

## 2016-10-02 NOTE — Addendum Note (Signed)
Addended by: Chana BodeGREEN, Charvez Voorhies L on: 10/02/2016 10:23 AM   Modules accepted: Orders

## 2016-10-06 DIAGNOSIS — I110 Hypertensive heart disease with heart failure: Secondary | ICD-10-CM | POA: Diagnosis not present

## 2016-10-06 DIAGNOSIS — I2510 Atherosclerotic heart disease of native coronary artery without angina pectoris: Secondary | ICD-10-CM | POA: Diagnosis not present

## 2016-10-06 DIAGNOSIS — I5032 Chronic diastolic (congestive) heart failure: Secondary | ICD-10-CM | POA: Diagnosis not present

## 2016-10-06 DIAGNOSIS — J449 Chronic obstructive pulmonary disease, unspecified: Secondary | ICD-10-CM | POA: Diagnosis not present

## 2016-10-06 DIAGNOSIS — I482 Chronic atrial fibrillation: Secondary | ICD-10-CM | POA: Diagnosis not present

## 2016-10-06 DIAGNOSIS — R2681 Unsteadiness on feet: Secondary | ICD-10-CM | POA: Diagnosis not present

## 2016-10-08 ENCOUNTER — Other Ambulatory Visit: Payer: Self-pay

## 2016-10-08 DIAGNOSIS — I2510 Atherosclerotic heart disease of native coronary artery without angina pectoris: Secondary | ICD-10-CM | POA: Diagnosis not present

## 2016-10-08 DIAGNOSIS — R2681 Unsteadiness on feet: Secondary | ICD-10-CM | POA: Diagnosis not present

## 2016-10-08 DIAGNOSIS — I110 Hypertensive heart disease with heart failure: Secondary | ICD-10-CM | POA: Diagnosis not present

## 2016-10-08 DIAGNOSIS — I5032 Chronic diastolic (congestive) heart failure: Secondary | ICD-10-CM | POA: Diagnosis not present

## 2016-10-08 DIAGNOSIS — J449 Chronic obstructive pulmonary disease, unspecified: Secondary | ICD-10-CM | POA: Diagnosis not present

## 2016-10-08 DIAGNOSIS — I482 Chronic atrial fibrillation: Secondary | ICD-10-CM | POA: Diagnosis not present

## 2016-10-08 MED ORDER — SERTRALINE HCL 25 MG PO TABS: 50.0000 mg | ORAL_TABLET | Freq: Every day | ORAL | 1 refills | Status: DC

## 2016-10-09 IMAGING — CR DG CHEST 1V PORT
1 series · 1 of 1 positions shown · non-contrast
Comparison: 03/26/2011.

CLINICAL DATA: Shortness of breath.

EXAM:
PORTABLE CHEST 1 VIEW

[AP]
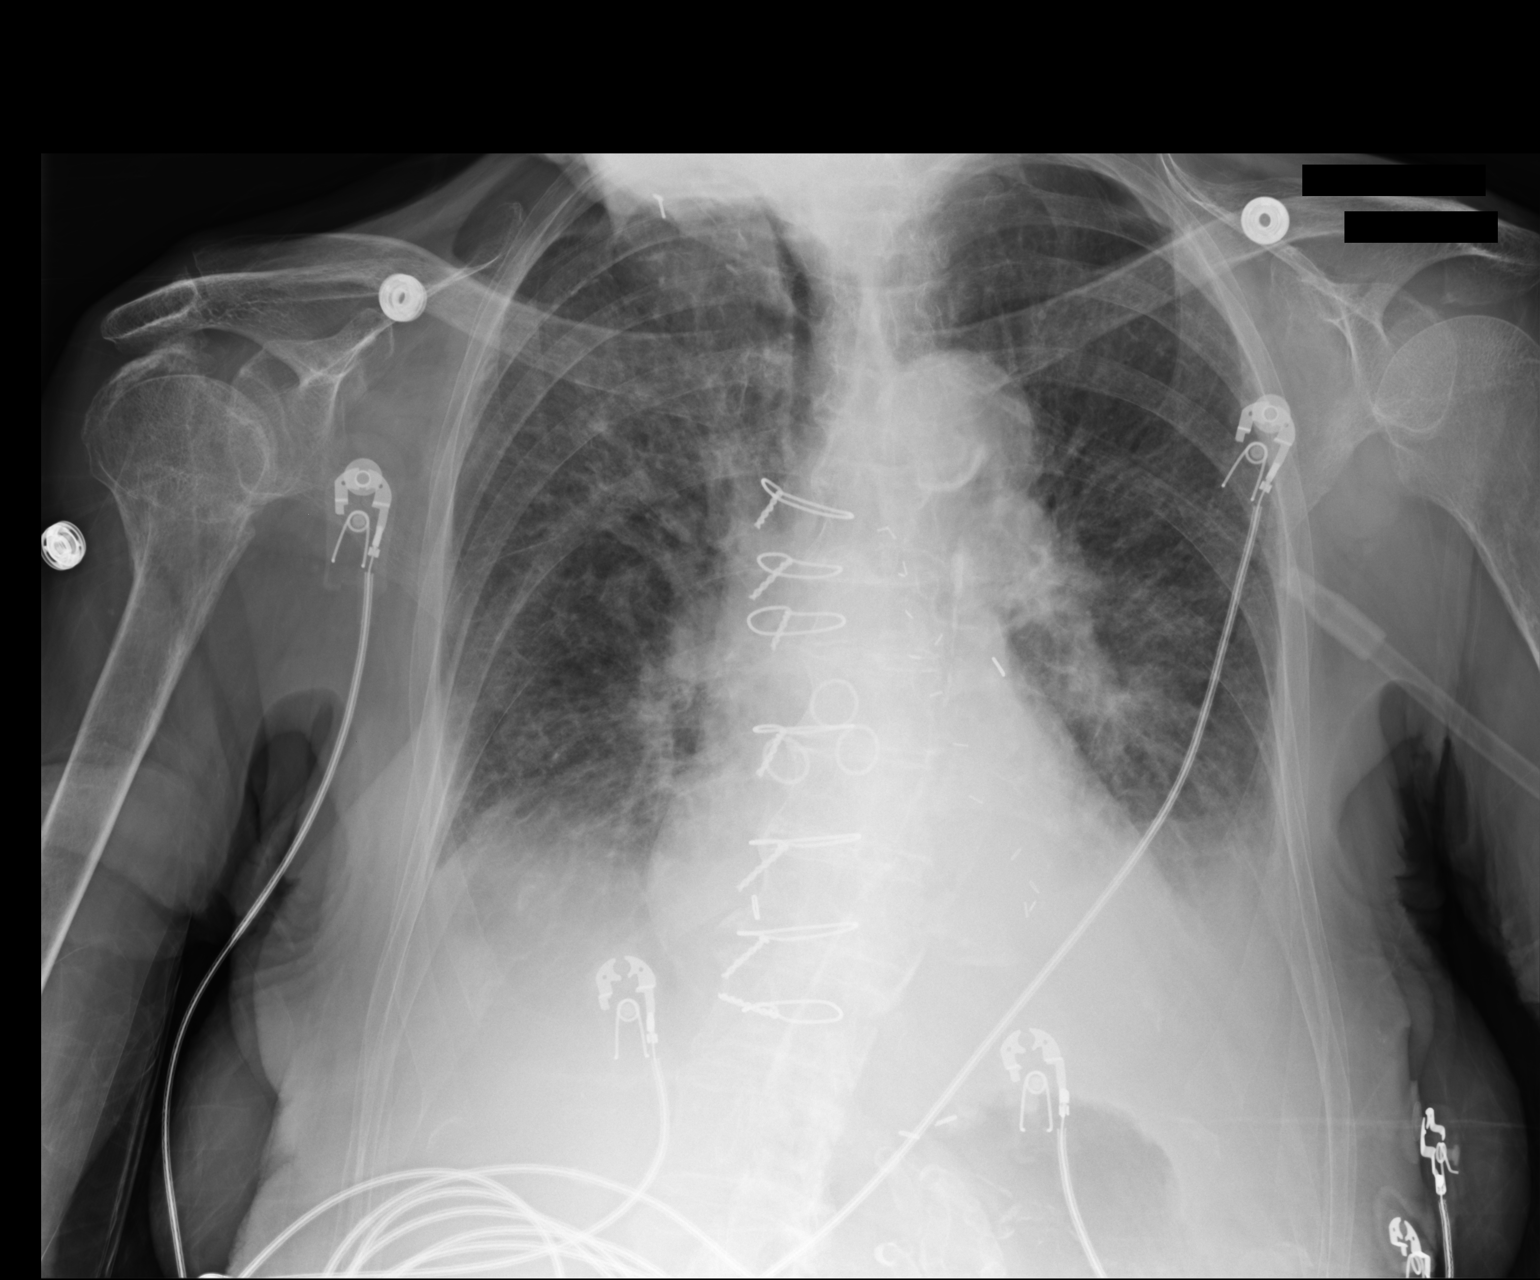

[1 of 1 positions shown; findings below may reference images not displayed]

FINDINGS: Mildly enlarged cardiac silhouette with a mild increase in size.
Stable post CABG changes. Increased prominence of the pulmonary
vasculature and interstitial markings. Interval small bilateral
pleural effusions. Diffuse osteopenia. Old, healed right humeral
head and neck fracture.
IMPRESSION: Interval changes of acute congestive heart failure.

## 2016-10-09 IMAGING — CT CT HEAD W/O CM
4 of 6 series · 13 of 47 positions shown, 14 images · non-contrast
Comparison: CT scan of February 25, 2014.

CLINICAL DATA: Scalp hematoma after falling. No reported loss of
consciousness.

EXAM:
CT HEAD WITHOUT CONTRAST
CT CERVICAL SPINE WITHOUT CONTRAST
TECHNIQUE: Multidetector CT imaging of the head and cervical spine was
performed following the standard protocol without intravenous
contrast. Multiplanar CT image reconstructions of the cervical spine
were also generated.

[Series 3: head without · axial · non-contrast · 0.43mm/px · z∈[+802,+877]mm · 3 of 31 slices shown, 4 images]
[im 8/31  brain]
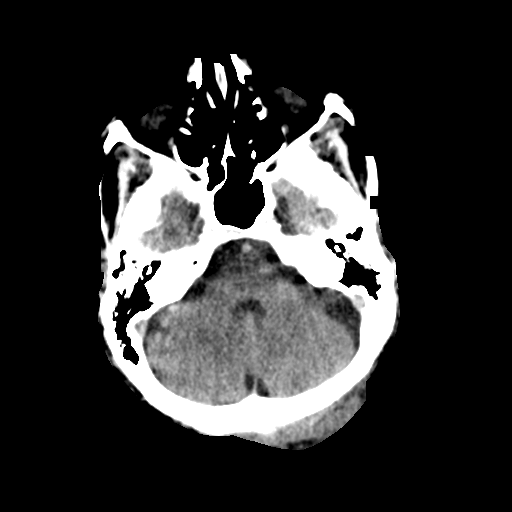
[im 8/31  bone]
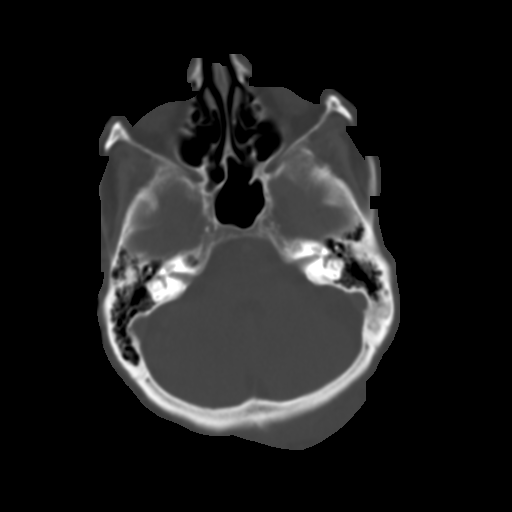
[im 16/31  brain]
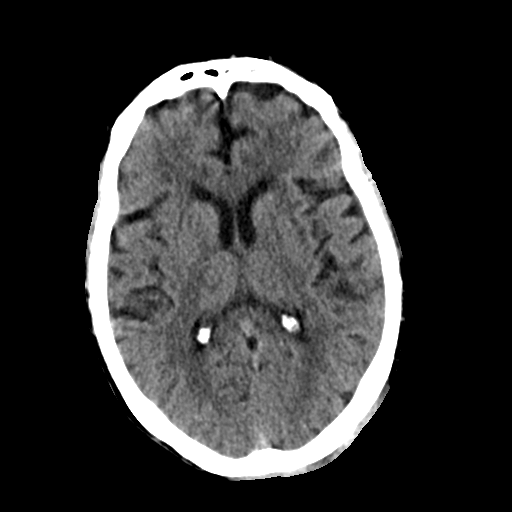
[im 23/31  brain]
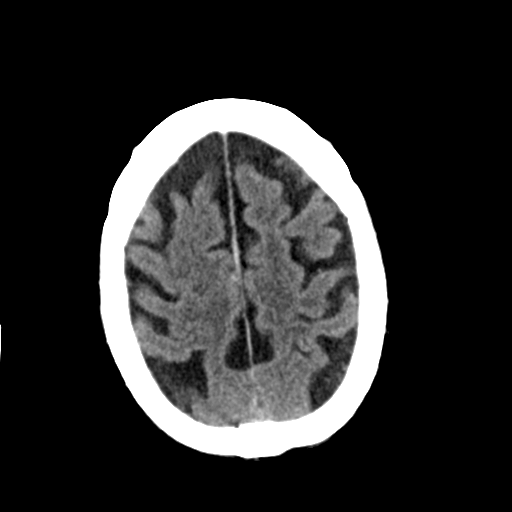

[Series 4: head bone · axial · 0.43mm/px · z∈[+779,+835]mm · 4 of 77 slices shown]
[im 7/77  bone]
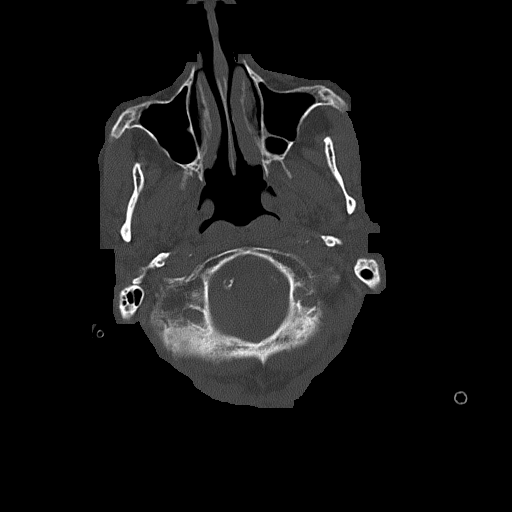
[im 14/77  bone]
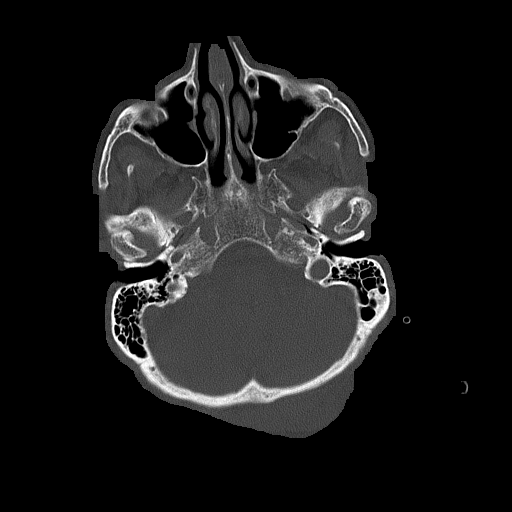
[im 28/77  bone]
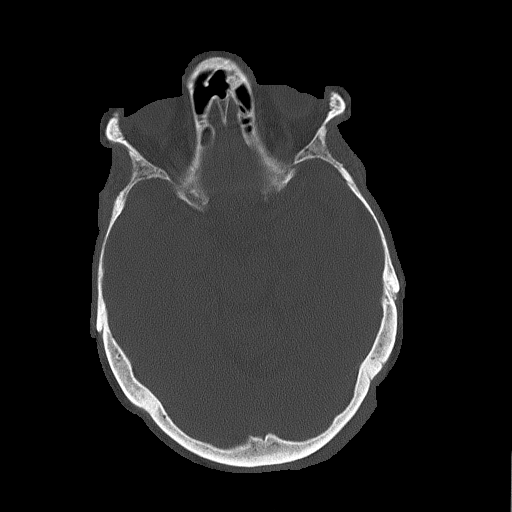
[im 35/77  bone]
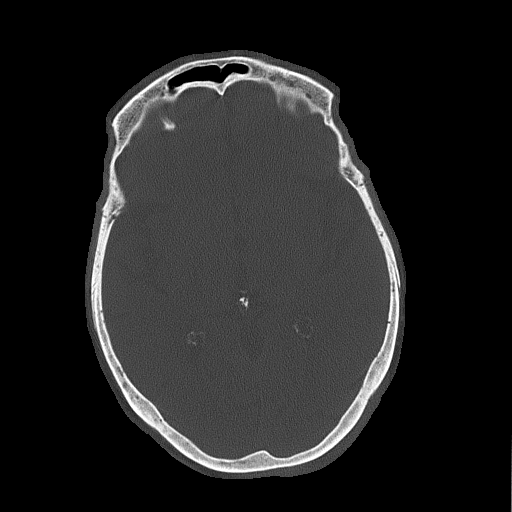

[Series 7: c_spine 2.0 sag bone · sagittal · 0.35mm/px · 3 of 57 slices shown]
[im 19/57  brain]
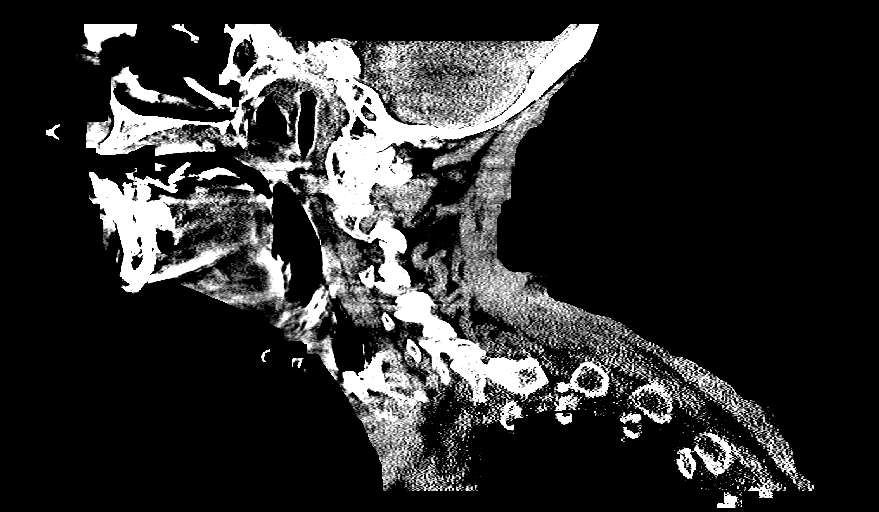
[im 29/57  brain]
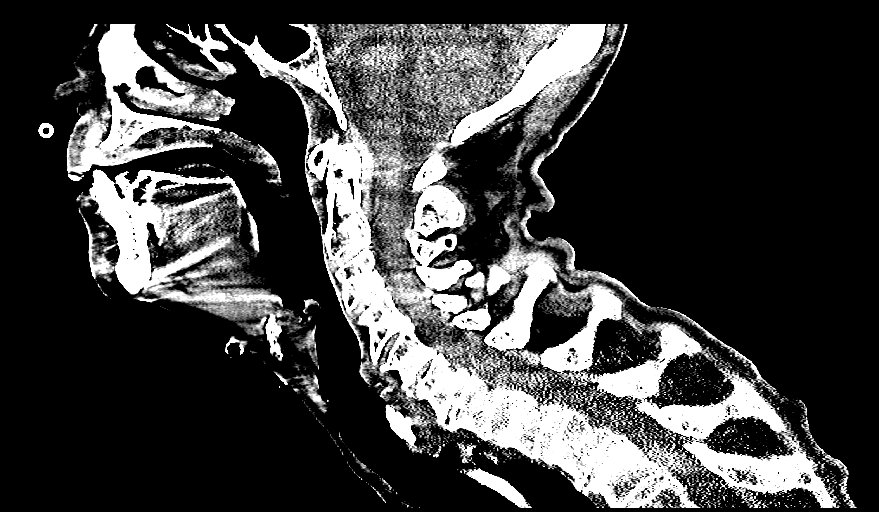
[im 38/57  brain]
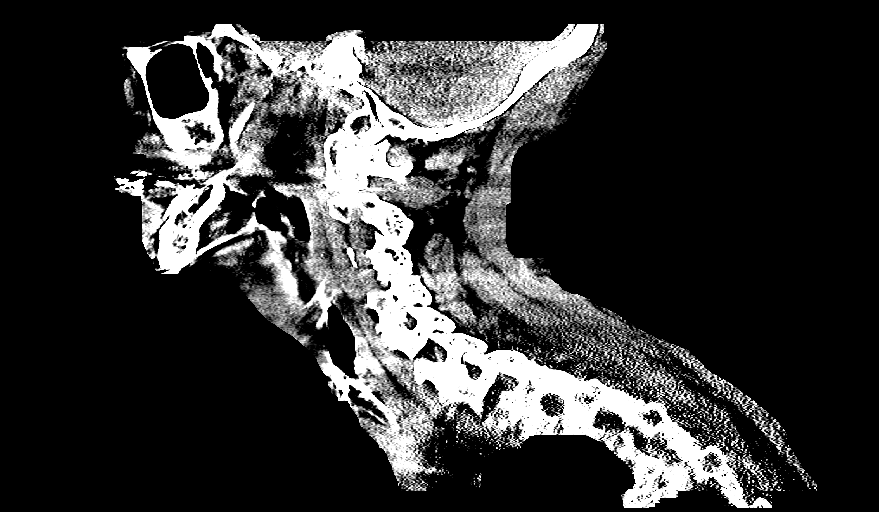

[Series 8: c_spine 2.0 cor bone · coronal · 0.24mm/px · 3 of 60 slices shown]
[im 12/60  brain]
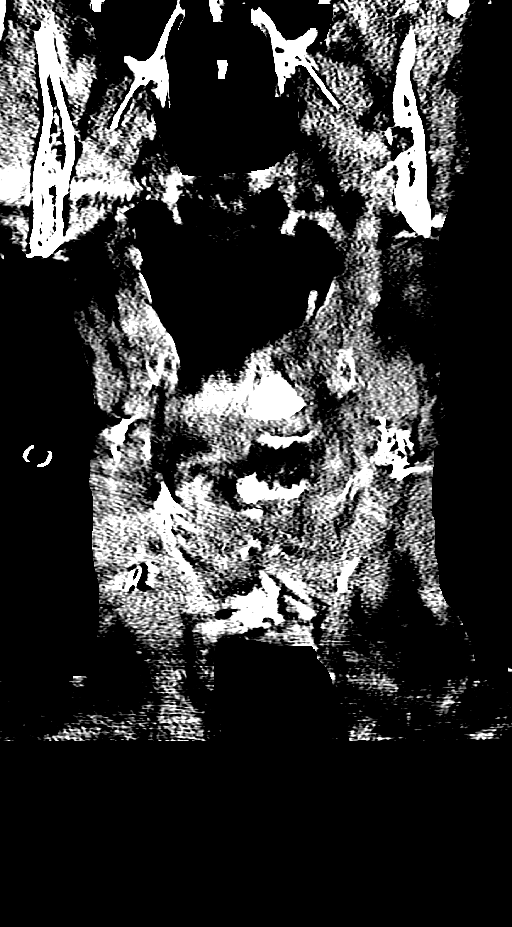
[im 24/60  brain]
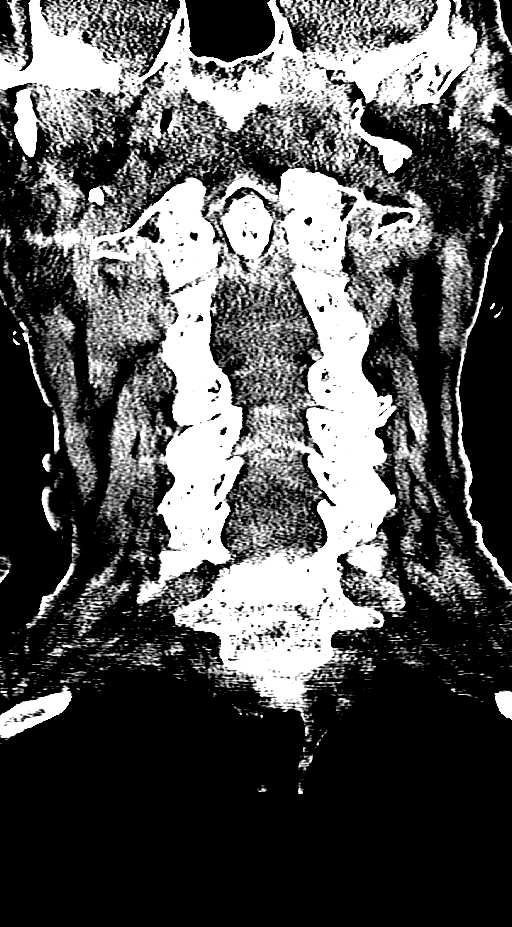
[im 36/60  brain]
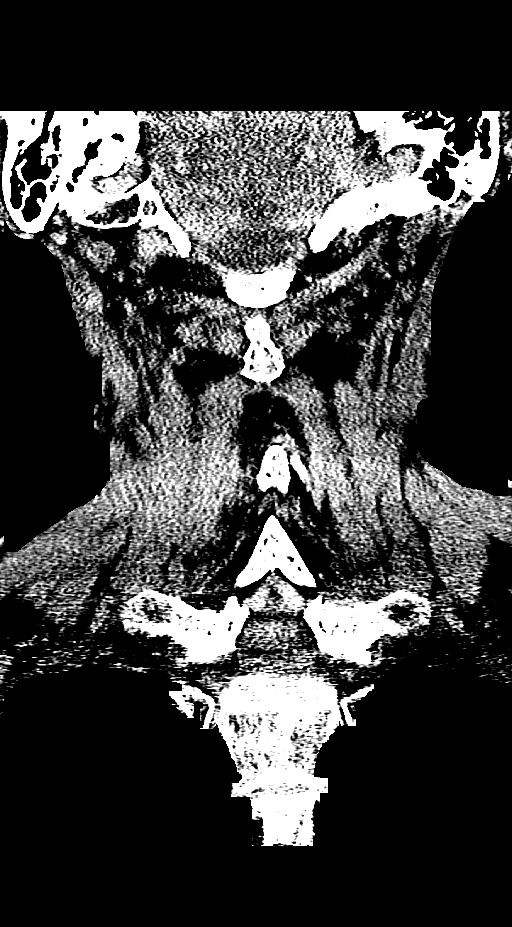

[13 of 47 positions shown; findings below may reference images not displayed]

FINDINGS: CT HEAD FINDINGS

Bony calvarium appears intact. Mild diffuse cortical atrophy is
noted. Mild chronic ischemic white matter disease is noted. Large
left occipital scalp hematoma is noted. No mass effect or midline
shift is noted. Ventricular size is within normal limits. There is
no evidence of mass lesion or acute infarction. 4 mm hyperechoic
focus is noted in the right parafalcine area of right parietal
cortex concerning for small contusion.

CT CERVICAL SPINE FINDINGS

No fracture or spondylolisthesis is noted. Mild degenerative disc
disease is noted at C4-5, C5-6 and C6-7 with anterior osteophyte
formation. Visualized lung apices appear normal.
IMPRESSION: Multilevel degenerative disc disease is noted in the cervical spine.
No fracture or significant spondylolisthesis is noted.

Mild diffuse cortical atrophy. Mild chronic ischemic white matter
disease. Large left occipital scalp hematoma. Small hyperdense focus
noted in right parafalcine area of right parietal cortex concerning
for small contusion. Critical Value/emergent results were called by
telephone at the time of interpretation on 01/22/2015 at [DATE] to
Dr. TANGAWIYA ALFILALI , who verbally acknowledged these results.

## 2016-10-11 IMAGING — CR DG CHEST 1V PORT
1 series · 1 of 1 positions shown · non-contrast
Comparison: January 22, 2015

CLINICAL DATA: Hypoxia

EXAM:
PORTABLE CHEST 1 VIEW

[AP]
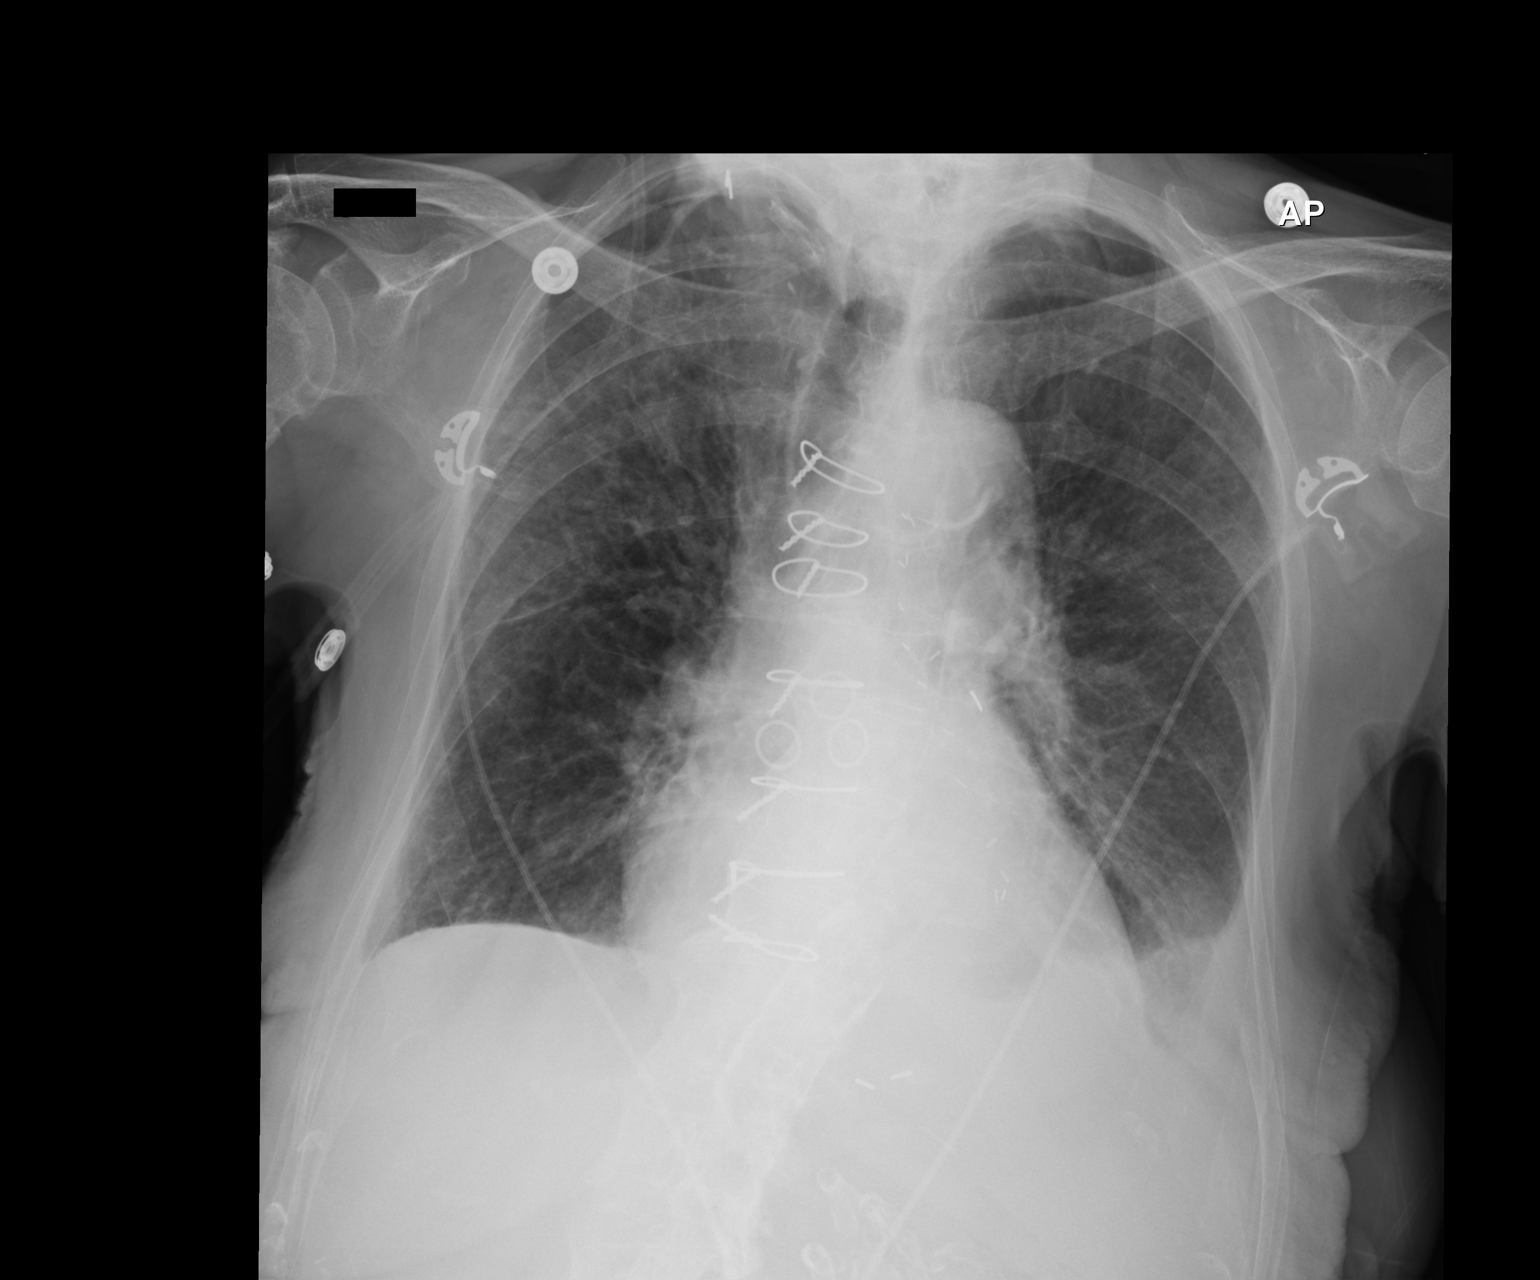

[1 of 1 positions shown; findings below may reference images not displayed]

FINDINGS: The heart size and mediastinal contours are stable. Patient is
status post prior median sternotomy and CABG. The heart size mildly
enlarged. There is left pleural effusion with consolidation of left
lung base. Mild interstitial edema is identified bilaterally. The
visualized skeletal structures are stable.
IMPRESSION: Mild interstitial edema.

Small left pleural effusion with consolidation left lung base
probably due to underlying atelectasis.

## 2016-11-16 ENCOUNTER — Other Ambulatory Visit: Payer: Self-pay

## 2016-11-16 ENCOUNTER — Emergency Department (HOSPITAL_COMMUNITY): Payer: Medicare Other

## 2016-11-16 ENCOUNTER — Other Ambulatory Visit (HOSPITAL_COMMUNITY): Payer: Self-pay

## 2016-11-16 ENCOUNTER — Inpatient Hospital Stay (HOSPITAL_COMMUNITY)
Admission: EM | Admit: 2016-11-16 | Discharge: 2016-11-19 | DRG: 157 | Disposition: A | Discharge status: Skilled Nursing Facility | Payer: Medicare Other | Attending: Internal Medicine | Admitting: Internal Medicine

## 2016-11-16 ENCOUNTER — Encounter (HOSPITAL_COMMUNITY): Payer: Self-pay

## 2016-11-16 ENCOUNTER — Telehealth: Payer: Self-pay | Admitting: Internal Medicine

## 2016-11-16 DIAGNOSIS — R531 Weakness: Secondary | ICD-10-CM

## 2016-11-16 DIAGNOSIS — I482 Chronic atrial fibrillation, unspecified: Secondary | ICD-10-CM | POA: Diagnosis present

## 2016-11-16 DIAGNOSIS — R1310 Dysphagia, unspecified: Secondary | ICD-10-CM | POA: Diagnosis not present

## 2016-11-16 DIAGNOSIS — J9610 Chronic respiratory failure, unspecified whether with hypoxia or hypercapnia: Secondary | ICD-10-CM | POA: Diagnosis not present

## 2016-11-16 DIAGNOSIS — B370 Candidal stomatitis: Secondary | ICD-10-CM | POA: Diagnosis not present

## 2016-11-16 DIAGNOSIS — K2270 Barrett's esophagus without dysplasia: Secondary | ICD-10-CM | POA: Diagnosis present

## 2016-11-16 DIAGNOSIS — Z87891 Personal history of nicotine dependence: Secondary | ICD-10-CM

## 2016-11-16 DIAGNOSIS — E43 Unspecified severe protein-calorie malnutrition: Secondary | ICD-10-CM | POA: Diagnosis present

## 2016-11-16 DIAGNOSIS — R7989 Other specified abnormal findings of blood chemistry: Secondary | ICD-10-CM | POA: Diagnosis not present

## 2016-11-16 DIAGNOSIS — R634 Abnormal weight loss: Secondary | ICD-10-CM | POA: Diagnosis not present

## 2016-11-16 DIAGNOSIS — I248 Other forms of acute ischemic heart disease: Secondary | ICD-10-CM | POA: Diagnosis present

## 2016-11-16 DIAGNOSIS — R1314 Dysphagia, pharyngoesophageal phase: Secondary | ICD-10-CM | POA: Diagnosis not present

## 2016-11-16 DIAGNOSIS — I2510 Atherosclerotic heart disease of native coronary artery without angina pectoris: Secondary | ICD-10-CM | POA: Diagnosis not present

## 2016-11-16 DIAGNOSIS — M6281 Muscle weakness (generalized): Secondary | ICD-10-CM | POA: Diagnosis not present

## 2016-11-16 DIAGNOSIS — E039 Hypothyroidism, unspecified: Secondary | ICD-10-CM | POA: Diagnosis present

## 2016-11-16 DIAGNOSIS — E860 Dehydration: Secondary | ICD-10-CM | POA: Diagnosis not present

## 2016-11-16 DIAGNOSIS — Z66 Do not resuscitate: Secondary | ICD-10-CM | POA: Diagnosis present

## 2016-11-16 DIAGNOSIS — D696 Thrombocytopenia, unspecified: Secondary | ICD-10-CM | POA: Diagnosis present

## 2016-11-16 DIAGNOSIS — E785 Hyperlipidemia, unspecified: Secondary | ICD-10-CM | POA: Diagnosis present

## 2016-11-16 DIAGNOSIS — Z888 Allergy status to other drugs, medicaments and biological substances status: Secondary | ICD-10-CM

## 2016-11-16 DIAGNOSIS — Z86010 Personal history of colonic polyps: Secondary | ICD-10-CM

## 2016-11-16 DIAGNOSIS — K219 Gastro-esophageal reflux disease without esophagitis: Secondary | ICD-10-CM | POA: Diagnosis present

## 2016-11-16 DIAGNOSIS — I779 Disorder of arteries and arterioles, unspecified: Secondary | ICD-10-CM | POA: Diagnosis not present

## 2016-11-16 DIAGNOSIS — R1312 Dysphagia, oropharyngeal phase: Secondary | ICD-10-CM | POA: Diagnosis present

## 2016-11-16 DIAGNOSIS — R404 Transient alteration of awareness: Secondary | ICD-10-CM | POA: Diagnosis not present

## 2016-11-16 DIAGNOSIS — K22719 Barrett's esophagus with dysplasia, unspecified: Secondary | ICD-10-CM | POA: Diagnosis not present

## 2016-11-16 DIAGNOSIS — R4702 Dysphasia: Secondary | ICD-10-CM | POA: Diagnosis not present

## 2016-11-16 DIAGNOSIS — Z8249 Family history of ischemic heart disease and other diseases of the circulatory system: Secondary | ICD-10-CM | POA: Diagnosis not present

## 2016-11-16 DIAGNOSIS — I10 Essential (primary) hypertension: Secondary | ICD-10-CM | POA: Diagnosis not present

## 2016-11-16 DIAGNOSIS — R2689 Other abnormalities of gait and mobility: Secondary | ICD-10-CM | POA: Diagnosis not present

## 2016-11-16 DIAGNOSIS — E7800 Pure hypercholesterolemia, unspecified: Secondary | ICD-10-CM | POA: Diagnosis not present

## 2016-11-16 DIAGNOSIS — R509 Fever, unspecified: Secondary | ICD-10-CM | POA: Diagnosis not present

## 2016-11-16 DIAGNOSIS — I110 Hypertensive heart disease with heart failure: Secondary | ICD-10-CM | POA: Diagnosis present

## 2016-11-16 DIAGNOSIS — R7401 Elevation of levels of liver transaminase levels: Secondary | ICD-10-CM

## 2016-11-16 DIAGNOSIS — R748 Abnormal levels of other serum enzymes: Secondary | ICD-10-CM

## 2016-11-16 DIAGNOSIS — K449 Diaphragmatic hernia without obstruction or gangrene: Secondary | ICD-10-CM | POA: Diagnosis present

## 2016-11-16 DIAGNOSIS — R740 Nonspecific elevation of levels of transaminase and lactic acid dehydrogenase [LDH]: Secondary | ICD-10-CM

## 2016-11-16 DIAGNOSIS — I5032 Chronic diastolic (congestive) heart failure: Secondary | ICD-10-CM | POA: Diagnosis not present

## 2016-11-16 DIAGNOSIS — Z681 Body mass index (BMI) 19 or less, adult: Secondary | ICD-10-CM | POA: Diagnosis not present

## 2016-11-16 DIAGNOSIS — J9611 Chronic respiratory failure with hypoxia: Secondary | ICD-10-CM | POA: Diagnosis present

## 2016-11-16 DIAGNOSIS — Z5189 Encounter for other specified aftercare: Secondary | ICD-10-CM | POA: Diagnosis not present

## 2016-11-16 DIAGNOSIS — R4182 Altered mental status, unspecified: Secondary | ICD-10-CM | POA: Diagnosis not present

## 2016-11-16 DIAGNOSIS — B999 Unspecified infectious disease: Secondary | ICD-10-CM | POA: Diagnosis not present

## 2016-11-16 DIAGNOSIS — I25810 Atherosclerosis of coronary artery bypass graft(s) without angina pectoris: Secondary | ICD-10-CM | POA: Diagnosis present

## 2016-11-16 DIAGNOSIS — E876 Hypokalemia: Secondary | ICD-10-CM | POA: Diagnosis not present

## 2016-11-16 DIAGNOSIS — R945 Abnormal results of liver function studies: Secondary | ICD-10-CM | POA: Diagnosis not present

## 2016-11-16 DIAGNOSIS — K5900 Constipation, unspecified: Secondary | ICD-10-CM | POA: Diagnosis not present

## 2016-11-16 DIAGNOSIS — J449 Chronic obstructive pulmonary disease, unspecified: Secondary | ICD-10-CM | POA: Diagnosis present

## 2016-11-16 DIAGNOSIS — I739 Peripheral vascular disease, unspecified: Secondary | ICD-10-CM

## 2016-11-16 DIAGNOSIS — Z9181 History of falling: Secondary | ICD-10-CM

## 2016-11-16 DIAGNOSIS — Z885 Allergy status to narcotic agent status: Secondary | ICD-10-CM

## 2016-11-16 DIAGNOSIS — I360 Nonrheumatic tricuspid (valve) stenosis: Secondary | ICD-10-CM | POA: Diagnosis not present

## 2016-11-16 DIAGNOSIS — R278 Other lack of coordination: Secondary | ICD-10-CM | POA: Diagnosis not present

## 2016-11-16 DIAGNOSIS — N179 Acute kidney failure, unspecified: Secondary | ICD-10-CM | POA: Diagnosis not present

## 2016-11-16 DIAGNOSIS — Z7982 Long term (current) use of aspirin: Secondary | ICD-10-CM

## 2016-11-16 LAB — URINALYSIS, ROUTINE W REFLEX MICROSCOPIC
Bacteria, UA: NONE SEEN
Bilirubin Urine: NEGATIVE
GLUCOSE, UA: NEGATIVE mg/dL
Ketones, ur: NEGATIVE mg/dL
Leukocytes, UA: NEGATIVE
Nitrite: NEGATIVE
PH: 5 (ref 5.0–8.0)
Protein, ur: NEGATIVE mg/dL
SPECIFIC GRAVITY, URINE: 1.014 (ref 1.005–1.030)
SQUAMOUS EPITHELIAL / LPF: NONE SEEN

## 2016-11-16 LAB — I-STAT VENOUS BLOOD GAS, ED
Acid-Base Excess: 9 mmol/L — ABNORMAL HIGH (ref 0.0–2.0)
Bicarbonate: 33.4 mmol/L — ABNORMAL HIGH (ref 20.0–28.0)
O2 SAT: 57 %
PCO2 VEN: 44.3 mmHg (ref 44.0–60.0)
TCO2: 35 mmol/L (ref 0–100)
pH, Ven: 7.485 — ABNORMAL HIGH (ref 7.250–7.430)
pO2, Ven: 28 mmHg — CL (ref 32.0–45.0)

## 2016-11-16 LAB — CBC WITH DIFFERENTIAL/PLATELET
BASOS PCT: 0 %
Basophils Absolute: 0 10*3/uL (ref 0.0–0.1)
EOS ABS: 0 10*3/uL (ref 0.0–0.7)
Eosinophils Relative: 0 %
HEMATOCRIT: 42.8 % (ref 36.0–46.0)
Hemoglobin: 13.7 g/dL (ref 12.0–15.0)
LYMPHS ABS: 0.5 10*3/uL — AB (ref 0.7–4.0)
Lymphocytes Relative: 7 %
MCH: 30.1 pg (ref 26.0–34.0)
MCHC: 32 g/dL (ref 30.0–36.0)
MCV: 94.1 fL (ref 78.0–100.0)
MONO ABS: 0.6 10*3/uL (ref 0.1–1.0)
MONOS PCT: 8 %
NEUTROS ABS: 6.8 10*3/uL (ref 1.7–7.7)
Neutrophils Relative %: 85 %
Platelets: 148 10*3/uL — ABNORMAL LOW (ref 150–400)
RBC: 4.55 MIL/uL (ref 3.87–5.11)
RDW: 14.2 % (ref 11.5–15.5)
WBC: 8 10*3/uL (ref 4.0–10.5)

## 2016-11-16 LAB — COMPREHENSIVE METABOLIC PANEL
ALBUMIN: 3 g/dL — AB (ref 3.5–5.0)
ALT: 106 U/L — ABNORMAL HIGH (ref 14–54)
ANION GAP: 11 (ref 5–15)
AST: 194 U/L — ABNORMAL HIGH (ref 15–41)
Alkaline Phosphatase: 89 U/L (ref 38–126)
BILIRUBIN TOTAL: 1.1 mg/dL (ref 0.3–1.2)
BUN: 20 mg/dL (ref 6–20)
CALCIUM: 8.8 mg/dL — AB (ref 8.9–10.3)
CO2: 28 mmol/L (ref 22–32)
Chloride: 100 mmol/L — ABNORMAL LOW (ref 101–111)
Creatinine, Ser: 1.13 mg/dL — ABNORMAL HIGH (ref 0.44–1.00)
GFR calc non Af Amer: 41 mL/min — ABNORMAL LOW (ref >60)
GFR, EST AFRICAN AMERICAN: 47 mL/min — AB (ref >60)
GLUCOSE: 121 mg/dL — AB (ref 65–99)
POTASSIUM: 4.7 mmol/L (ref 3.5–5.1)
Sodium: 139 mmol/L (ref 135–145)
TOTAL PROTEIN: 6.4 g/dL — AB (ref 6.5–8.1)

## 2016-11-16 LAB — TROPONIN I
TROPONIN I: 0.05 ng/mL — AB (ref <0.03)
Troponin I: 0.06 ng/mL (ref <0.03)

## 2016-11-16 LAB — LACTIC ACID, PLASMA: LACTIC ACID, VENOUS: 2 mmol/L — AB (ref 0.5–1.9)

## 2016-11-16 LAB — CK: Total CK: 912 U/L — ABNORMAL HIGH (ref 38–234)

## 2016-11-16 LAB — MAGNESIUM: MAGNESIUM: 2.1 mg/dL (ref 1.7–2.4)

## 2016-11-16 MED ORDER — DILTIAZEM HCL 25 MG/5ML IV SOLN
5.0000 mg | Freq: Once | INTRAVENOUS | Status: AC
  Administered 2016-11-16: 5 mg via INTRAVENOUS
  Filled 2016-11-16: qty 5

## 2016-11-16 MED ORDER — SODIUM CHLORIDE 0.9 % IV BOLUS (SEPSIS)
1000.0000 mL | Freq: Once | INTRAVENOUS | Status: AC
  Administered 2016-11-16: 1000 mL via INTRAVENOUS

## 2016-11-16 NOTE — ED Triage Notes (Addendum)
Pt arrived today via EMS: Per EMS: c/o generalized weakness x2 weeks, per daughter, who is the pt's primary care taker. EMS states pt answers questions appropriately but does get somewhat confused/disoriented, which is baseline per family. Caretaker told EMS pt has not been eating/drinking well for at least the last 2 wks. Stands w/ assistance, per EMS. Caretaker/daughter told EMS that she need assistance with home care/possible social work consult. EMS. EMS VS:  P 102, BP 90/72, CBG 130, 02 96% RA.

## 2016-11-16 NOTE — ED Notes (Signed)
Family at bedside & MD Mesner at bedside currently

## 2016-11-16 NOTE — ED Provider Notes (Signed)
[facility]-EMERGENCY DEPT Provider Note   CSN: [id number] Arrival date & time: 11/16/16  1433     History   Chief Complaint Chief Complaint  Patient presents with  . Weakness    HPI Jasmine [name] is a 83 y.o. female.  HPI  83 year old female with a history of atrial fibrillation, Barrett's esophagus, coronary artery disease, hypertension, hyperlipidemia presents to the emergency department today with generalized weakness. Family states that she left rehabilitation a few weeks ago and since that time she had a slow decline secondary to poor oral intake. She states that she feels like the food is getting stuck in her throat and SHE GAGS it BACK UP OR IT'S PAINFUL AND SHE DOESN'T WANT TO EAT BECAUSE OF THAT. SHE'S HAD OVERALL GLOBAL WEAKNESS BUT NO SPECIFIC SYMPTOMS BESIDES DYSPNEA. Has had some weight loss and fatigue. No other associated or modifying symptoms.   Past Medical History:  Diagnosis Date  . Anxiety   . Atrial fibrillation with RVR (HCC)   . Barrett's esophagus   . CAD (coronary artery disease) of artery bypass graft 1995  . Fracture of right shoulder 02/2008  . GERD (gastroesophageal reflux disease)   . Hemorrhoids, external   . Hiatal hernia   . History of colon polyps   . Hyperlipidemia   . Hypertension   . Hypothyroidism   . Insomnia due to medical condition   . Left wrist fracture 09/2007  . Low back pain   . Syncope     Patient Active Problem List   Diagnosis Date Noted  . Chronic respiratory failure with hypoxia (HCC) 08/13/2016  . Depression with anxiety 08/13/2016  . DVT (deep venous thrombosis) (HCC) 07/15/2016  . COPD with acute exacerbation (HCC) 07/15/2016  . Constipation 11/20/2015  . Thoracic back pain 11/20/2015  . Thrombocytopenia (HCC) 01/23/2015  . Chronic diastolic CHF (congestive heart failure) (HCC) 01/22/2015  . Carotid artery disease (HCC) 03/22/2014  . CAD - CABG '97, cath OK 2005, Myoview low risk 12/12 01/06/2013  . HTN  (hypertension) 01/06/2013  . Chronic atrial fibrillation (HCC) 01/06/2013  . BARRETT'S ESOPHAGUS 06/13/2008  . INSOMNIA, CHRONIC 01/17/2007  . Hypothyroidism 01/12/2007  . HLD (hyperlipidemia) 01/12/2007  . Anxiety state 01/12/2007    Past Surgical History:  Procedure Laterality Date  . CARDIAC CATHETERIZATION  02/25/1996   No intervention-recommend CABG. Left main-80% concentric distal stenosis, LAD-99% ostial stenosis, first diag-80% segmental proximal stenosis, Circumflex-80% ostial stenosis, RCA-90% ostial stenosis.  Marland Kitchen CARDIAC CATHETERIZATION  05/21/2003   No intervention-widely patent grafts.  . CAROTID DOPPLER Bilateral 05/15/2010   Right Bulb/CEA-demonstrated trace irregular nonhemodynamically significant plaque 0-49%. Left Bulb/Proximal ICA-demonstrated irregular nonhemodynamically signifcant plaque 0-49%.  . CAROTID ENDARTERECTOMY    . CATARACT EXTRACTION    . COLONOSCOPY W/ POLYPECTOMY  06/2005  . CORONARY ARTERY BYPASS GRAFT  1997  . LEXISCAN MYOVIEW  04/03/2011   No scintigraphic evidence of inducible myocardial ischemia. No lexiscan EKG changes. Non-diagnostic for ischemia.   . TRANSTHORACIC ECHOCARDIOGRAM  01/08/2011   EF 50-55%, LA moderately dilated, moderate tricuspid regurg, mild pulmonary hypertension.    OB History    No data available       Home Medications    Prior to Admission medications   Medication Sig Start Date End Date Taking? Authorizing Provider  acetaminophen (TYLENOL) 500 MG tablet Take 1 tablet (500 mg total) by mouth every 6 (six) hours as needed for mild pain. For pain 04/10/15   Regalado, Belkys A, MD  aspirin 81 MG chewable  tablet Chew 81 mg by mouth daily.    [provider]  atorvastatin (LIPITOR) 80 MG tablet TAKE 1 TABLET DAILY 07/15/16   Lorre MunroeBaity, Regina W, NP  bisoprolol (ZEBETA) 10 MG tablet TAKE 1 TABLET DAILY 09/16/16   Lorre MunroeBaity, Regina W, NP  Calcium Carbonate-Vitamin D (CALCIUM 600-D) 600-400 MG-UNIT per tablet Take 1 tablet by  mouth daily.     [provider]  cyanocobalamin 500 MCG tablet Take 500 mcg by mouth daily.      [provider]  Dextromethorphan-Guaifenesin (ROBITUSSIN COUGH/CONGESTION PO) Take 10 mLs by mouth every 8 (eight) hours as needed (for cough/congestion).     [provider]  diltiazem (TIAZAC) 120 MG 24 hr capsule Take 1 capsule (120 mg total) by mouth daily. 03/30/16   Runell GessBerry, Jonathan J, MD  furosemide (LASIX) 20 MG tablet TAKE 1 TABLET DAILY (MUST SCHEDULE ANNUAL EXAM) 09/16/16   Lorre MunroeBaity, Regina W, NP  ipratropium-albuterol (DUONEB) 0.5-2.5 (3) MG/3ML SOLN Take 3 mLs by nebulization every 6 (six) hours as needed. 04/10/15   Regalado, Prentiss BellsBelkys A, MD  KLOR-CON M20 20 MEQ tablet TAKE 1 TABLET DAILY 09/16/16   Lorre MunroeBaity, Regina W, NP  levothyroxine (SYNTHROID, LEVOTHROID) 88 MCG tablet TAKE 1 TABLET DAILY 06/15/16   Lorre MunroeBaity, Regina W, NP  mirtazapine (REMERON) 45 MG tablet TAKE 1 TABLET AT BEDTIME 06/29/16   Lorre MunroeBaity, Regina W, NP  Multiple Vitamin (MULTIVITAMIN) capsule Take 1 capsule by mouth daily.      [provider]  nitroGLYCERIN (NITROSTAT) 0.4 MG SL tablet Place 1 tablet (0.4 mg total) under the tongue every 5 (five) minutes x 3 doses as needed for chest pain. 08/12/16   Berton BonHammond, Janine, NP  omeprazole (PRILOSEC) 20 MG capsule TAKE 1 CAPSULE DAILY 12/10/15   Lorre MunroeBaity, Regina W, NP  OXYGEN Inhale 2 L into the lungs at bedtime. Try to wean off-O2 sats must be kept at or above 93%    [provider]  QUEtiapine (SEROQUEL) 25 MG tablet TAKE 1 TABLET AT BEDTIME 07/08/16   Lorre MunroeBaity, Regina W, NP  sennosides-docusate sodium (SENOKOT-S) 8.6-50 MG tablet Take 1 tablet by mouth at bedtime. Hold for loose stool    [provider]  sertraline (ZOLOFT) 25 MG tablet Take 2 tablets (50 mg total) by mouth daily. 10/08/16   Lorre MunroeBaity, Regina W, NP  UNABLE TO FIND Med Name: Med Pass 2.0 90 mL twice daily    [provider]  vitamin E 400 UNIT capsule Take 400 Units by mouth  daily.      [provider]    Family History Family History  Problem Relation Age of Onset  . Heart disease Mother   . Heart disease Father   . Diabetes Son     Social History Social History  Substance Use Topics  . Smoking status: Former Smoker    Quit date: 04/20/1968  . Smokeless tobacco: Never Used     Comment: QUIT IN 1970  . Alcohol use No     Comment: STOPPED IN 1970     Allergies   Codeine; Meperidine hcl; and Oxycodone-acetaminophen   Review of Systems Review of Systems  All other systems reviewed and are negative.    Physical Exam Updated Vital Signs BP 125/86   Pulse (!) 106   Temp 98.9 F (37.2 C) (Rectal)   Resp 20   SpO2 95%   Physical Exam  Constitutional: She is oriented to person, place, and time. She appears cachectic.  HENT:  Head:  Normocephalic and atraumatic.  Mouth/Throat: Mucous membranes are dry.  Eyes: Conjunctivae and EOM are normal.  Neck: Normal range of motion.  Cardiovascular: Normal heart sounds.  An irregularly irregular rhythm present. Tachycardia present.   Pulmonary/Chest: Breath sounds normal. No stridor. Tachypnea noted. No respiratory distress.  Abdominal: Soft. She exhibits no distension.  Musculoskeletal: She exhibits no edema, tenderness or deformity.  Neurological: She is alert and oriented to person, place, and time. No cranial nerve deficit. She exhibits normal muscle tone. Coordination normal.  Skin: Skin is warm and dry.  Nursing note and vitals reviewed.    ED Treatments / Results  Labs (all labs ordered are listed, but only abnormal results are displayed) Labs Reviewed  CBC WITH DIFFERENTIAL/PLATELET  COMPREHENSIVE METABOLIC PANEL  TROPONIN I  LACTIC ACID, PLASMA  LACTIC ACID, PLASMA  MAGNESIUM  URINALYSIS, ROUTINE W REFLEX MICROSCOPIC  BLOOD GAS, VENOUS    EKG  EKG Interpretation None       Radiology No results found.  Procedures Procedures (including critical care  time)  Medications Ordered in ED Medications  sodium chloride 0.9 % bolus 1,000 mL (not administered)     Initial Impression / Assessment and Plan / ED Course  I have reviewed the triage vital signs and the nursing notes.  Pertinent labs & imaging results that were available during my care of the patient were reviewed by me and considered in my medical decision making (see chart for details).     Patient is obviously dehydrated which is likely related to weakness. I wonder if her parents esophagus is gotten worse and may need an EGD to evaluate this. Plan for a lab workup and discussion with GI and medicine.  Workup consistent with likely dehydration. Discussed with [facility] who will consult and likely EGD in AM. Requested clear fluids and NPO after midnight otherwise.   Final Clinical Impressions(s) / ED Diagnoses   Final diagnoses:  None    New Prescriptions New Prescriptions   No medications on file     Meyer Arora, Barbara CowerJason, MD 11/17/16 [phone]

## 2016-11-16 NOTE — ED Notes (Signed)
Since patient had passed the swallow screen initially, pt was given orange juice. Leafy RoKelly Boland, RN noticed patient began to cough and had trouble swallowing the orange juice. She auscultated lung sounds and noted no change, but this RN will fail patient on her swallow screen for SLP bedside evaluation.

## 2016-11-16 NOTE — Telephone Encounter (Signed)
Pt in ED.  

## 2016-11-16 NOTE — ED Notes (Signed)
Pt transported to xray 

## 2016-11-16 NOTE — Telephone Encounter (Signed)
[facility] - Client TELEPHONE ADVICE RECORD [facility]  Patient Name: Jasmine [name]  DOB: Nov 01, 1922    Initial Comment Caller states mother not hardly eating, can barely get herself off the toilet   Nurse Assessment  Nurse: Odis LusterBowers, RN, Bjorn Loserhonda Date/Time (Eastern Time): 11/16/2016 1:22:27 PM  Confirm and document reason for call. If symptomatic, describe symptoms. ---Caller states mother not hardly eating, can barely get herself off the toilet. Not drinking very much either, is urinating.  Does the patient have any new or worsening symptoms? ---Yes  Will a triage be completed? ---Yes  Related visit to physician within the last 2 weeks? ---No  Does the PT have any chronic conditions? (i.e. diabetes, asthma, etc.) ---Yes  List chronic conditions. ---heart med (CHF)  Is this a behavioral health or substance abuse call? ---No     Guidelines    Guideline Title Affirmed Question Affirmed Notes  Breathing Difficulty [1] MODERATE difficulty breathing (e.g., speaks in phrases, SOB even at rest, pulse 100-120) AND [2] NEW-onset or WORSE than normal    Final Disposition User   Go to ED Now Odis LusterBowers, RN, Rhonda    Referrals  [facility]   Disagree/Comply: Comply

## 2016-11-16 NOTE — H&P (Signed)
Jasmine [name]:[id number] DOB: [birth year] DOA: 11/16/2016     PCP: Lorre Munroe, NP   Outpatient Specialists: Cardiology Allyson Sabal  Patient coming from:  home Lives   With family   Chief Complaint: Lightheadedness and generalized weakness  HPI: Jasmine [name] is a 83 y.o. female with medical history significant of COPD, Anxiety Depression, diastolic CHF, Afib, HTN, hypothyroidism, Barretts esophagus, CAD    Presented with 2 week history of worsening lightheadedness gyrus weakness secondary to decreased by mouth intake. Patient had been having prolonged history of pain with swallowing she decreased her by mouth intake didn't provider manage her pain. Progressively became weaker and now only able to stand with assistance as she does not have baseline daughter called primary care provider who recommended patient to be taken to emergency department EMS was called. Heart rate while to arrival blood pressure 190/72 CBG 130  Patient has mild confusion at baseline  Patient reports that she feeling that food is getting stuck in her throat and she has to gag to get it back up. He has known history of Barrett's esophagitis and followed by GI. Reports she has been taking her medications. The trouble swallowing was gradual in onset for the past few months starting with large pieces of food but by now she can only do fluids but takes a long time to get them down. She has been drinking Boost. .   Regarding pertinent Chronic problems: CAD hx of CABG in 1997, as well as carotid endarterectomy, plus cardiac catheterization 2005 with normal LV function and patent grafts History of atrial fibrillation on Xarelto  for stroke prophylaxis patient later on converted to sinus rhythm and 2 coagulation was discontinued due to fall risk Per Cardiology last stress test was in   December 2012 was unremarkable   IN ER:  Temp (24hrs), Avg:98.5 F (36.9 C), Min:97.8 F (36.6 C), Max:98.9 F (37.2 C)      on  arrival  ED Triage Vitals  Enc Vitals Group     BP 11/16/16 1433 127/65     Pulse Rate 11/16/16 1433 (!) 114     Resp 11/16/16 1433 20     Temp 11/16/16 1433 98.7 F (37.1 C)     Temp Source 11/16/16 1433 Oral     SpO2 11/16/16 1433 97 %     Weight --      Height --      Head Circumference --      Peak Flow --      Pain Score 11/16/16 1502 0     Pain Loc --      Pain Edu? --      Excl. in GC? --   RR 25 HR 109 BP 120/73  VBG 7.485/44.3 WBC 8.0 hemoglobin 13.7 platelets 148 Sodium 1:30 9K4.7 creatinine 1.13 albumin 3.0 Troponin 0.05  Magnesium 2.1  CT head nonacute  Chest x-ray chronic lung changes but nonacute  Following Medications were ordered in ER: Medications  sodium chloride 0.9 % bolus 1,000 mL (0 mLs Intravenous Stopped 11/16/16 1638)  sodium chloride 0.9 % bolus 1,000 mL (1,000 mLs Intravenous New Bag/Given 11/16/16 1816)  diltiazem (CARDIZEM) injection 5 mg (5 mg Intravenous Given 11/16/16 1814)     ER provider discussed case with: [facility] who recommends admission to medicine O seen in the morning keep clear fluids for tonight and nothing by mouth post midnight for possible EGD in a.m.  Hospitalist was called for admission for dehydration secondary  to decreased by mouth intake secondary to odynophagia  Review of Systems:    Pertinent positives include: fatigue, weight loss  Pain with swallowing Difficulty swallowing, chest pain after eating  Constitutional:  No weight loss, night sweats, Fevers, chills, HEENT:  No headaches, Tooth/dental problems, Sore throat,  No sneezing, itching, ear ache, nasal congestion, post nasal drip,  Cardio-vascular:  No , Orthopnea, PND, anasarca, dizziness, palpitations.no Bilateral lower extremity swelling  GI:  No heartburn, indigestion, abdominal pain, nausea, vomiting, diarrhea, change in bowel habits, loss of appetite, melena, blood in stool, hematemesis Resp:  no shortness of breath at rest. No dyspnea on exertion, No  excess mucus, no productive cough, No non-productive cough, No coughing up of blood.No change in color of mucus.No wheezing. Skin:  no rash or lesions. No jaundice GU:  no dysuria, change in color of urine, no urgency or frequency. No straining to urinate.  No flank pain.  Musculoskeletal:  No joint pain or no joint swelling. No decreased range of motion. No back pain.  Psych:  No change in mood or affect. No depression or anxiety. No memory loss.  Neuro: no localizing neurological complaints, no tingling, no weakness, no double vision, no gait abnormality, no slurred speech, no confusion  As per HPI otherwise 10 point review of systems negative.   Past Medical History: Past Medical History:  Diagnosis Date  . Anxiety   . Atrial fibrillation with RVR (HCC)   . Barrett's esophagus   . CAD (coronary artery disease) of artery bypass graft 1995  . Fracture of right shoulder 02/2008  . GERD (gastroesophageal reflux disease)   . Hemorrhoids, external   . Hiatal hernia   . History of colon polyps   . Hyperlipidemia   . Hypertension   . Hypothyroidism   . Insomnia due to medical condition   . Left wrist fracture 09/2007  . Low back pain   . Syncope    Past Surgical History:  Procedure Laterality Date  . CARDIAC CATHETERIZATION  02/25/1996   No intervention-recommend CABG. Left main-80% concentric distal stenosis, LAD-99% ostial stenosis, first diag-80% segmental proximal stenosis, Circumflex-80% ostial stenosis, RCA-90% ostial stenosis.  Marland Kitchen CARDIAC CATHETERIZATION  05/21/2003   No intervention-widely patent grafts.  . CAROTID DOPPLER Bilateral 05/15/2010   Right Bulb/CEA-demonstrated trace irregular nonhemodynamically significant plaque 0-49%. Left Bulb/Proximal ICA-demonstrated irregular nonhemodynamically signifcant plaque 0-49%.  . CAROTID ENDARTERECTOMY    . CATARACT EXTRACTION    . COLONOSCOPY W/ POLYPECTOMY  06/2005  . CORONARY ARTERY BYPASS GRAFT  1997  . LEXISCAN MYOVIEW   04/03/2011   No scintigraphic evidence of inducible myocardial ischemia. No lexiscan EKG changes. Non-diagnostic for ischemia.   . TRANSTHORACIC ECHOCARDIOGRAM  01/08/2011   EF 50-55%, LA moderately dilated, moderate tricuspid regurg, mild pulmonary hypertension.     Social History:  Ambulatory  walker     reports that she quit smoking about 48 years ago. She has never used smokeless tobacco. She reports that she does not drink alcohol or use drugs.  Allergies:   Allergies  Allergen Reactions  . Codeine     Reaction unknown to patient  . Meperidine Hcl     Reaction unknown to patient  . Oxycodone-Acetaminophen     Reaction unknown to patient       Family History:   Family History  Problem Relation Age of Onset  . Heart disease Mother   . Heart disease Father   . Diabetes Son     Medications: Prior to Admission  medications   Medication Sig Start Date End Date Taking? Authorizing Provider  acetaminophen (TYLENOL) 500 MG tablet Take 1 tablet (500 mg total) by mouth every 6 (six) hours as needed for mild pain. For pain 04/10/15   Regalado, Belkys A, MD  aspirin 81 MG chewable tablet Chew 81 mg by mouth daily.    [provider]  atorvastatin (LIPITOR) 80 MG tablet TAKE 1 TABLET DAILY 07/15/16   Lorre MunroeBaity, Regina W, NP  bisoprolol (ZEBETA) 10 MG tablet TAKE 1 TABLET DAILY 09/16/16   Lorre MunroeBaity, Regina W, NP  Calcium Carbonate-Vitamin D (CALCIUM 600-D) 600-400 MG-UNIT per tablet Take 1 tablet by mouth daily.     [provider]  cyanocobalamin 500 MCG tablet Take 500 mcg by mouth daily.      [provider]  Dextromethorphan-Guaifenesin (ROBITUSSIN COUGH/CONGESTION PO) Take 10 mLs by mouth every 8 (eight) hours as needed (for cough/congestion).     [provider]  diltiazem (TIAZAC) 120 MG 24 hr capsule Take 1 capsule (120 mg total) by mouth daily. 03/30/16   Runell GessBerry, Jonathan J, MD  furosemide (LASIX) 20 MG tablet TAKE 1 TABLET DAILY (MUST SCHEDULE  ANNUAL EXAM) 09/16/16   Lorre MunroeBaity, Regina W, NP  ipratropium-albuterol (DUONEB) 0.5-2.5 (3) MG/3ML SOLN Take 3 mLs by nebulization every 6 (six) hours as needed. 04/10/15   Regalado, Prentiss BellsBelkys A, MD  KLOR-CON M20 20 MEQ tablet TAKE 1 TABLET DAILY 09/16/16   Lorre MunroeBaity, Regina W, NP  levothyroxine (SYNTHROID, LEVOTHROID) 88 MCG tablet TAKE 1 TABLET DAILY 06/15/16   Lorre MunroeBaity, Regina W, NP  mirtazapine (REMERON) 45 MG tablet TAKE 1 TABLET AT BEDTIME 06/29/16   Lorre MunroeBaity, Regina W, NP  Multiple Vitamin (MULTIVITAMIN) capsule Take 1 capsule by mouth daily.      [provider]  nitroGLYCERIN (NITROSTAT) 0.4 MG SL tablet Place 1 tablet (0.4 mg total) under the tongue every 5 (five) minutes x 3 doses as needed for chest pain. 08/12/16   Berton BonHammond, Janine, NP  omeprazole (PRILOSEC) 20 MG capsule TAKE 1 CAPSULE DAILY 12/10/15   Lorre MunroeBaity, Regina W, NP  OXYGEN Inhale 2 L into the lungs at bedtime. Try to wean off-O2 sats must be kept at or above 93%    [provider]  QUEtiapine (SEROQUEL) 25 MG tablet TAKE 1 TABLET AT BEDTIME 07/08/16   Lorre MunroeBaity, Regina W, NP  sennosides-docusate sodium (SENOKOT-S) 8.6-50 MG tablet Take 1 tablet by mouth at bedtime. Hold for loose stool    [provider]  sertraline (ZOLOFT) 25 MG tablet Take 2 tablets (50 mg total) by mouth daily. 10/08/16   Lorre MunroeBaity, Regina W, NP  UNABLE TO FIND Med Name: Med Pass 2.0 90 mL twice daily    [provider]  vitamin E 400 UNIT capsule Take 400 Units by mouth daily.      [provider]    Physical Exam: Patient Vitals for the past 24 hrs:  BP Temp Temp src Pulse Resp SpO2  11/16/16 1845 135/84 - - 100 (!) 25 97 %  11/16/16 1809 - 97.8 F (36.6 C) - - - -  11/16/16 1800 (!) 124/95 - - - (!) 25 100 %  11/16/16 1745 135/81 - - (!) 119 (!) 24 97 %  11/16/16 1730 124/68 - - - - 96 %  11/16/16 1630 126/73 - - (!) 115 (!) 30 98 %  11/16/16 1615 124/80 - - (!) 108 (!) 24 100 %  11/16/16 1545 126/88 - - - - 95 %  11/16/16 1538 -  98.9 F (37.2 C) Rectal - - -  11/16/16 1515 125/86 - - (!) 106 - 95 %  11/16/16 1500 110/70 - - (!) 116 - 97 %  11/16/16 1433 127/65 98.7 F (37.1 C) Oral (!) 114 20 97 %    1. General:  in No Acute distress cachectic appearing 2. Psychological: Alert and  Oriented 3. Head/ENT:     Dry Mucous Membranes                          Head Non traumatic, neck supple                           Poor Dentition 4. SKIN:   decreased Skin turgor,  Skin clean Dry and intact no rash 5. Heart: Regular rate and rhythm no Murmur, Rub or gallop 6. Lungs:   no wheezes or crackles   7. Abdomen: Soft, non-tender, Non distended 8. Lower extremities: no clubbing, cyanosis, or edema 9. Neurologically Grossly intact, moving all 4 extremities equally  10. MSK: Normal range of motion   body mass index is unknown because there is no height or weight on file.  Labs on Admission:   Labs on Admission: I have personally reviewed following labs and imaging studies  CBC:  Recent Labs Lab 11/16/16 1350  WBC 8.0  NEUTROABS 6.8  HGB 13.7  HCT 42.8  MCV 94.1  PLT 148*   Basic Metabolic Panel:  Recent Labs Lab 11/16/16 1350  NA 139  K 4.7  CL 100*  CO2 28  GLUCOSE 121*  BUN 20  CREATININE 1.13*  CALCIUM 8.8*  MG 2.1   GFR: CrCl cannot be calculated (Unknown ideal weight.). Liver Function Tests:  Recent Labs Lab 11/16/16 1350  AST 194*  ALT 106*  ALKPHOS 89  BILITOT 1.1  PROT 6.4*  ALBUMIN 3.0*   No results for input(s): LIPASE, AMYLASE in the last 168 hours. No results for input(s): AMMONIA in the last 168 hours. Coagulation Profile: No results for input(s): INR, PROTIME in the last 168 hours. Cardiac Enzymes:  Recent Labs Lab 11/16/16 1350  TROPONINI 0.05*   BNP (last 3 results) No results for input(s): PROBNP in the last 8760 hours. HbA1C: No results for input(s): HGBA1C in the last 72 hours. CBG: No results for input(s): GLUCAP in the last 168 hours. Lipid Profile: No  results for input(s): CHOL, HDL, LDLCALC, TRIG, CHOLHDL, LDLDIRECT in the last 72 hours. Thyroid Function Tests: No results for input(s): TSH, T4TOTAL, FREET4, T3FREE, THYROIDAB in the last 72 hours. Anemia Panel: No results for input(s): VITAMINB12, FOLATE, FERRITIN, TIBC, IRON, RETICCTPCT in the last 72 hours. Urine analysis:    Component Value Date/Time   COLORURINE YELLOW 11/16/2016 1730   APPEARANCEUR CLEAR 11/16/2016 1730   LABSPEC 1.014 11/16/2016 1730   PHURINE 5.0 11/16/2016 1730   GLUCOSEU NEGATIVE 11/16/2016 1730   HGBUR SMALL (A) 11/16/2016 1730   HGBUR 1+ 05/06/2010 1520   BILIRUBINUR NEGATIVE 11/16/2016 1730   KETONESUR NEGATIVE 11/16/2016 1730   PROTEINUR NEGATIVE 11/16/2016 1730   UROBILINOGEN 1.0 01/22/2015 1721   NITRITE NEGATIVE 11/16/2016 1730   LEUKOCYTESUR NEGATIVE 11/16/2016 1730   Sepsis Labs: @LABRCNTIP (procalcitonin:4,lacticidven:4) )No results found for this or any previous visit (from the past 240 hour(s)).    UA no evidence of UTI     Lab Results  Component Value Date   HGBA1C 6.0 (H)  01/23/2015    CrCl cannot be calculated (Unknown ideal weight.).  BNP (last 3 results) No results for input(s): PROBNP in the last 8760 hours.   ECG REPORT  Independently reviewed Rate: 116   Rhythm: a.fib w RVR ST&T Change: No acute ischemic changes   QTC 500  There were no vitals filed for this visit.   Cultures:    Component Value Date/Time   SDES URINE, CLEAN CATCH 07/15/2016 2125   SPECREQUEST NONE 07/15/2016 2125   CULT MULTIPLE SPECIES PRESENT, SUGGEST RECOLLECTION (A) 07/15/2016 2125   REPTSTATUS 07/17/2016 FINAL 07/15/2016 2125     Radiological Exams on Admission: Dg Chest 2 View  Result Date: 11/16/2016 CLINICAL DATA:  Generalized weakness for 2 weeks. EXAM: CHEST  2 VIEW COMPARISON:  08/11/2016 FINDINGS: Chronic coarse lung markings. Few patchy densities at the lung bases may be chronic. Heart and mediastinum are within normal limits.  Thoracic aorta is heavily calcified. Prior median sternotomy and evidence of prior CABG procedure. No large areas of airspace disease or consolidation. No large pleural effusions. IMPRESSION: Chronic lung changes without acute findings. Aortic atherosclerosis. Electronically Signed   By: Richarda Overlie M.D.   On: 11/16/2016 16:33   Ct Head Wo Contrast  Result Date: 11/16/2016 CLINICAL DATA:  Generalized weakness.  Unable to stand. EXAM: CT HEAD WITHOUT CONTRAST TECHNIQUE: Contiguous axial images were obtained from the base of the skull through the vertex without intravenous contrast. COMPARISON:  CT head without contrast 07/16/2016 FINDINGS: Brain: Moderate atrophy and white matter disease is stable. No acute infarct, hemorrhage, or mass lesion is present. Remote is infarcts of the thalami are again noted. The basal ganglia are otherwise intact. Insular ribbon is normal bilaterally. The ventricles are proportionate to the degree of atrophy. No significant extra-axial fluid collection is present. The brainstem and cerebellum are otherwise within normal limits. Vascular: Atherosclerotic calcifications are present within the cavernous internal carotid artery's bilaterally. Calcifications are noted at the dural margin of the vertebral arteries. There is no hyperdense vessel. Skull: The calvarium is intact. No focal lytic or blastic lesions are present. Sinuses/Orbits: The paranasal sinuses and mastoid air cells are clear. Bilateral lens replacements are present. The globes and orbits are within normal limits bilaterally. IMPRESSION: 1. Stable moderate atrophy and white matter disease. This likely reflects the sequela of chronic microvascular ischemia. 2. Remote lacunar infarcts involving the thalami bilaterally. 3. No acute intracranial abnormality. 4. Atherosclerosis. Electronically Signed   By: Marin Roberts M.D.   On: 11/16/2016 16:41    Chart has been reviewed    Assessment/Plan  83 y.o. female with  medical history significant of COPD, Anxiety Depression, diastolic CHF, Afib, HTN, hypothyroidism, Barretts esophagus, CAD Admitted for dehydration secondary to decreased by mouth intake secondary to odynophagia    Present on Admission: Odynophagia - As per Gi keep NPO post midnight, EGD in AM . CAD - CABG '97, cath OK 2005, Myoview low risk 12/12 - continue home medications if able to tolerate, stable . Carotid artery disease (HCC) - chronic stable . Chronic atrial fibrillation (HCC) -  .Chronic Atrial fibrillation (HCC)        - CHA2DS2 vas score 6 :  Not on anticoagulation secondary to Risk of Falls,         -  Rate control:  Currently controlled with   Diltiazem will continue    . Chronic diastolic CHF (congestive heart failure) (HCC) - - currently appears to be slightly on the dry side, hold home diuretics  for tonight and restart when appears euvolemic, carefuly follow fluid status and Cr  . Barrett's esophagus - Protonix,  . Chronic respiratory failure with hypoxia (HCC) -states no longer uses oxygen sats 5% on RA . HTN (hypertension) - hold for tonight  . Dehydration - rehydrate Malnutriton   - will order nutritional consult check prealbumin Elevated troponin - no chest pain likely demand ischemia related, will obtain echo to eval for wall motion abnormalities Other plan as per orders.  DVT prophylaxis:  SCD    Code Status:    DNR/DNI as per patient    Family Communication:   Family not at  Bedside   Disposition Plan:     To home once workup is complete and patient is stable                         Would benefit from PT/OT eval prior to DC   ordered                            Consults called: [facility]  Admission status:   inpatient     Level of care     tele      I have spent a total of 56 min on this admission  Constantin Hillery 11/16/2016, 10:39 PM    [facility]  Pager [phone]   after 2 AM please page floor coverage PA If 7AM-7PM, please contact the  day team taking care of the patient  [email or web address]  Password [email or web address]

## 2016-11-17 ENCOUNTER — Inpatient Hospital Stay (HOSPITAL_COMMUNITY): Payer: Medicare Other

## 2016-11-17 LAB — PREALBUMIN: Prealbumin: 8.6 mg/dL — ABNORMAL LOW (ref 18–38)

## 2016-11-17 LAB — CBC
HCT: 41.3 % (ref 36.0–46.0)
Hemoglobin: 13.6 g/dL (ref 12.0–15.0)
MCH: 30.8 pg (ref 26.0–34.0)
MCHC: 32.9 g/dL (ref 30.0–36.0)
MCV: 93.4 fL (ref 78.0–100.0)
PLATELETS: 134 10*3/uL — AB (ref 150–400)
RBC: 4.42 MIL/uL (ref 3.87–5.11)
RDW: 13.7 % (ref 11.5–15.5)
WBC: 7.1 10*3/uL (ref 4.0–10.5)

## 2016-11-17 LAB — COMPREHENSIVE METABOLIC PANEL
ALK PHOS: 82 U/L (ref 38–126)
ALT: 102 U/L — AB (ref 14–54)
AST: 184 U/L — AB (ref 15–41)
Albumin: 2.6 g/dL — ABNORMAL LOW (ref 3.5–5.0)
Anion gap: 11 (ref 5–15)
BUN: 15 mg/dL (ref 6–20)
CALCIUM: 8.1 mg/dL — AB (ref 8.9–10.3)
CHLORIDE: 103 mmol/L (ref 101–111)
CO2: 27 mmol/L (ref 22–32)
CREATININE: 0.92 mg/dL (ref 0.44–1.00)
GFR, EST NON AFRICAN AMERICAN: 52 mL/min — AB (ref >60)
Glucose, Bld: 85 mg/dL (ref 65–99)
Potassium: 3.7 mmol/L (ref 3.5–5.1)
Sodium: 141 mmol/L (ref 135–145)
Total Bilirubin: 1 mg/dL (ref 0.3–1.2)
Total Protein: 5.8 g/dL — ABNORMAL LOW (ref 6.5–8.1)

## 2016-11-17 LAB — MAGNESIUM: Magnesium: 1.9 mg/dL (ref 1.7–2.4)

## 2016-11-17 LAB — PHOSPHORUS: Phosphorus: 3.2 mg/dL (ref 2.5–4.6)

## 2016-11-17 LAB — TROPONIN I
TROPONIN I: 0.06 ng/mL — AB (ref <0.03)
Troponin I: 0.06 ng/mL (ref <0.03)

## 2016-11-17 LAB — LACTIC ACID, PLASMA: LACTIC ACID, VENOUS: 0.8 mmol/L (ref 0.5–1.9)

## 2016-11-17 LAB — TSH: TSH: 1.311 u[IU]/mL (ref 0.350–4.500)

## 2016-11-17 MED ORDER — PANTOPRAZOLE SODIUM 40 MG IV SOLR
40.0000 mg | INTRAVENOUS | Status: DC
  Administered 2016-11-17 – 2016-11-18 (×2): 40 mg via INTRAVENOUS
  Filled 2016-11-17 (×2): qty 40

## 2016-11-17 MED ORDER — DILTIAZEM HCL 25 MG/5ML IV SOLN
10.0000 mg | Freq: Once | INTRAVENOUS | Status: AC
  Administered 2016-11-17: 10 mg via INTRAVENOUS
  Filled 2016-11-17: qty 5

## 2016-11-17 MED ORDER — FOLIC ACID 1 MG PO TABS
1.0000 mg | ORAL_TABLET | Freq: Every day | ORAL | Status: DC
  Administered 2016-11-17 – 2016-11-19 (×3): 1 mg via ORAL
  Filled 2016-11-17 (×3): qty 1

## 2016-11-17 MED ORDER — ATORVASTATIN CALCIUM 80 MG PO TABS
80.0000 mg | ORAL_TABLET | Freq: Every day | ORAL | Status: DC
  Administered 2016-11-17 – 2016-11-18 (×2): 80 mg via ORAL
  Filled 2016-11-17 (×2): qty 1

## 2016-11-17 MED ORDER — ONDANSETRON HCL 4 MG/2ML IJ SOLN: 4.0000 mg | Freq: Four times a day (QID) | INTRAMUSCULAR | Status: DC | PRN

## 2016-11-17 MED ORDER — DILTIAZEM HCL ER COATED BEADS 120 MG PO CP24
120.0000 mg | ORAL_CAPSULE | Freq: Every day | ORAL | Status: DC
  Administered 2016-11-17 – 2016-11-19 (×3): 120 mg via ORAL
  Filled 2016-11-17 (×3): qty 1

## 2016-11-17 MED ORDER — QUETIAPINE FUMARATE 25 MG PO TABS
25.0000 mg | ORAL_TABLET | Freq: Every day | ORAL | Status: DC
  Administered 2016-11-17 – 2016-11-18 (×3): 25 mg via ORAL
  Filled 2016-11-17 (×3): qty 1

## 2016-11-17 MED ORDER — BISACODYL 10 MG RE SUPP: 10.0000 mg | Freq: Every day | RECTAL | Status: DC | PRN

## 2016-11-17 MED ORDER — ONDANSETRON HCL 4 MG PO TABS: 4.0000 mg | ORAL_TABLET | Freq: Four times a day (QID) | ORAL | Status: DC | PRN

## 2016-11-17 MED ORDER — SODIUM CHLORIDE 0.9 % IV SOLN
INTRAVENOUS | Status: AC
  Administered 2016-11-17: 01:00:00 via INTRAVENOUS

## 2016-11-17 MED ORDER — VITAMIN B-1 100 MG PO TABS
100.0000 mg | ORAL_TABLET | Freq: Every day | ORAL | Status: DC
  Administered 2016-11-17 – 2016-11-19 (×3): 100 mg via ORAL
  Filled 2016-11-17 (×3): qty 1

## 2016-11-17 MED ORDER — BOOST PO LIQD
237.0000 mL | Freq: Two times a day (BID) | ORAL | Status: DC
  Filled 2016-11-17 (×4): qty 237

## 2016-11-17 MED ORDER — ACETAMINOPHEN 325 MG PO TABS: 650.0000 mg | ORAL_TABLET | Freq: Four times a day (QID) | ORAL | Status: DC | PRN

## 2016-11-17 MED ORDER — METOPROLOL TARTRATE 5 MG/5ML IV SOLN
5.0000 mg | Freq: Once | INTRAVENOUS | Status: DC
Start: 2016-11-17 — End: 2016-11-17

## 2016-11-17 MED ORDER — METOPROLOL TARTRATE 25 MG PO TABS
25.0000 mg | ORAL_TABLET | Freq: Two times a day (BID) | ORAL | Status: DC
  Administered 2016-11-17 – 2016-11-19 (×4): 25 mg via ORAL
  Filled 2016-11-17 (×4): qty 1

## 2016-11-17 MED ORDER — LEVOTHYROXINE SODIUM 88 MCG PO TABS
88.0000 ug | ORAL_TABLET | Freq: Every day | ORAL | Status: DC
  Administered 2016-11-17 – 2016-11-19 (×3): 88 ug via ORAL
  Filled 2016-11-17 (×3): qty 1

## 2016-11-17 MED ORDER — BOOST PO LIQD
237.0000 mL | Freq: Three times a day (TID) | ORAL | Status: DC
  Administered 2016-11-18 – 2016-11-19 (×2): 237 mL via ORAL
  Filled 2016-11-17 (×10): qty 237

## 2016-11-17 MED ORDER — MIRTAZAPINE 45 MG PO TABS
45.0000 mg | ORAL_TABLET | Freq: Every day | ORAL | Status: DC
  Administered 2016-11-17 – 2016-11-18 (×3): 45 mg via ORAL
  Filled 2016-11-17: qty 3
  Filled 2016-11-17: qty 1
  Filled 2016-11-17: qty 3
  Filled 2016-11-17 (×3): qty 1

## 2016-11-17 MED ORDER — SERTRALINE HCL 50 MG PO TABS
50.0000 mg | ORAL_TABLET | Freq: Every day | ORAL | Status: DC
  Administered 2016-11-17 – 2016-11-19 (×3): 50 mg via ORAL
  Filled 2016-11-17 (×3): qty 1

## 2016-11-17 MED ORDER — SENNA 8.6 MG PO TABS
1.0000 | ORAL_TABLET | Freq: Two times a day (BID) | ORAL | Status: DC
  Administered 2016-11-18 – 2016-11-19 (×2): 8.6 mg via ORAL
  Filled 2016-11-17 (×5): qty 1

## 2016-11-17 MED ORDER — ACETAMINOPHEN 650 MG RE SUPP
650.0000 mg | Freq: Four times a day (QID) | RECTAL | Status: DC | PRN
Start: 2016-11-17 — End: 2016-11-19

## 2016-11-17 MED ORDER — METOPROLOL TARTRATE 5 MG/5ML IV SOLN
2.5000 mg | Freq: Once | INTRAVENOUS | Status: AC
  Administered 2016-11-17: 2.5 mg via INTRAVENOUS
  Filled 2016-11-17: qty 5

## 2016-11-17 MED ORDER — POLYETHYLENE GLYCOL 3350 17 G PO PACK: 17.0000 g | PACK | Freq: Every day | ORAL | Status: DC | PRN

## 2016-11-17 NOTE — Progress Notes (Signed)
Initial Nutrition Assessment  DOCUMENTATION CODES:   Underweight, Severe malnutrition in context of chronic illness  INTERVENTION:   -Once diet is advanced, add Boost Plus TID  NUTRITION DIAGNOSIS:   Malnutrition (Severe) related to chronic illness (Barretts esophagus) as evidenced by severe depletion of muscle mass, severe depletion of body fat, energy intake < 75% for > or equal to 3 months.  GOAL:   Patient will meet greater than or equal to 90% of their needs  MONITOR:   PO intake, Supplement acceptance, Diet advancement, Labs, Weight trends, Skin, I & O's  REASON FOR ASSESSMENT:   Consult  (malnutrition)  ASSESSMENT:   83 y.o. female with medical history significant of COPD, Anxiety Depression, diastolic CHF, Afib, HTN, hypothyroidism, Barretts esophagus, CAD Admitted for dehydration secondary to decreased by mouth intake secondary to odynophagia   Spoke with pt and daughter at bedside, who report pt has experienced a general decline in health over the past 6 months, as a result of an extended hospitalization for pneumonia, SNF stay, and difficulty swallowing.   Per pt daughter, pt experienced swallowing difficulty over the past 3 months, since discharge from SNF. Pt eats very little at baseline (a few bites and sips at each meal), which has gotten progressively worse over this time period. She consumes chocolate Boost supplements 1-2 times daily PTA- per daughter, pt will not consume any other supplement. She endorses ongoing weight loss. Noted a 6.2% wt loss over the past 3 months, which while not significant for time frame, is concerning given poor oral intake.   Pt currently NPO for upcoming EGD. SLP also evaluated pt and is considering MBSS. Pt voices understanding for NPO order, but also expressed desire to eat and consume chocolate Boost supplements.   Nutrition-Focused physical exam completed. Findings are severe fat depletion, severe muscle depletion, and no edema.    Discussed potential for diet advancement and importance of good meal and supplement intake to promote healing. Strongly advised pt to continue supplements at home.   Medications include folvite, remeron, and vitamin B-1.   Labs reviewed.  Diet Order:  Diet NPO time specified  Skin:  Reviewed, no issues  Last BM:  11/17/16  Height:   Ht Readings from Last 1 Encounters:  11/17/16 5\' 3"  (1.6 m)    Weight:   Wt Readings from Last 1 Encounters:  11/17/16 90 lb (40.8 kg)    Ideal Body Weight:  52.3 kg  BMI:  Body mass index is 15.94 kg/m.  Estimated Nutritional Needs:   Kcal:  1000-1200  Protein:  45-60 grams  Fluid:  > 1 L  EDUCATION NEEDS:   Education needs addressed  Anielle Headrick A. Mayford KnifeWilliams, RD, LDN, CDE Pager: [phone] After hours Pager: [phone]

## 2016-11-17 NOTE — Evaluation (Signed)
Clinical/Bedside Swallow Evaluation Patient Details  Name: Jasmine [name] MRN: [id number] Date of Birth: Sep 30, 1922  Today's Date: 11/17/2016 Time: SLP Start Time (ACUTE ONLY): 0940 SLP Stop Time (ACUTE ONLY): 1000 SLP Time Calculation (min) (ACUTE ONLY): 20 min  Past Medical History:  Past Medical History:  Diagnosis Date  . Anxiety   . Atrial fibrillation with RVR (HCC)   . Barrett's esophagus   . CAD (coronary artery disease) of artery bypass graft 1995  . Fracture of right shoulder 02/2008  . GERD (gastroesophageal reflux disease)   . Hemorrhoids, external   . Hiatal hernia   . History of colon polyps   . Hyperlipidemia   . Hypertension   . Hypothyroidism   . Insomnia due to medical condition   . Left wrist fracture 09/2007  . Low back pain   . Syncope    Past Surgical History:  Past Surgical History:  Procedure Laterality Date  . CARDIAC CATHETERIZATION  02/25/1996   No intervention-recommend CABG. Left main-80% concentric distal stenosis, LAD-99% ostial stenosis, first diag-80% segmental proximal stenosis, Circumflex-80% ostial stenosis, RCA-90% ostial stenosis.  Marland Kitchen. CARDIAC CATHETERIZATION  05/21/2003   No intervention-widely patent grafts.  . CAROTID DOPPLER Bilateral 05/15/2010   Right Bulb/CEA-demonstrated trace irregular nonhemodynamically significant plaque 0-49%. Left Bulb/Proximal ICA-demonstrated irregular nonhemodynamically signifcant plaque 0-49%.  . CAROTID ENDARTERECTOMY    . CATARACT EXTRACTION    . COLONOSCOPY W/ POLYPECTOMY  06/2005  . CORONARY ARTERY BYPASS GRAFT  1997  . LEXISCAN MYOVIEW  04/03/2011   No scintigraphic evidence of inducible myocardial ischemia. No lexiscan EKG changes. Non-diagnostic for ischemia.   . TRANSTHORACIC ECHOCARDIOGRAM  01/08/2011   EF 50-55%, LA moderately dilated, moderate tricuspid regurg, mild pulmonary hypertension.   HPI:  Pt is a 83 y.o. female with PMH of COPD, Anxiety Depression, diastolic CHF, Afib, HTN, hypothyroidism,  Barretts esophagus, CAD. Presented with 2 week history of worsening lightheadedness gyrus weakness secondary to decreased by mouth intake. Patient had been having prolonged history of pain with swallowing she decreased her by mouth intake didn't provider manage her pain. Progressively became weaker and now only able to stand with assistance as she does not have baseline daughter called primary care provider who recommended patient to be taken to emergency department EMS was called. Mild confusion at baseline. Reports food getting stuck in throat and has to gag to get it back up. Reports trouble swallowing has been gradual the past few months starting with alrge pieces of food; currently can only swallow fluids but still takes a long time to get them down. CXR negative for acute findings. EGD 2010 revealed barrett's esophagus in distal esophagus 33-36 cm in length, hiatal hernia. Bedside swallow eval ordered.   Assessment / Plan / Recommendation Clinical Impression  Pt with intermittent coughing/ throat clearing with sips of thin liquid and when taking pills, occasional globus sensation, multiple swallows across consistencies. Barrett's esophagus identified in 2010; this may be the main contributing factor to the pt's dysphagia but cannot rule out pharyngeal phase difficulties from bedside swallow evaluation. Recommend proceeding with MBS to objectively evaluate. Pt is currently NPO for EGD later today; will plan on MBS next date. After EGD, recommend for pt to return to clear liquid diet with small meds whole in liquid, larger meds crushed in puree.   SLP Visit Diagnosis: Dysphagia, unspecified (R13.10)    Aspiration Risk  Moderate aspiration risk    Diet Recommendation Thin liquid   Liquid Administration via: Cup;Straw Medication Administration: Other (Comment) (  pt does fine with small pills; crush in puree for large) Supervision: Intermittent supervision to cue for compensatory  strategies Compensations: Slow rate;Small sips/bites Postural Changes: Seated upright at 90 degrees;Remain upright for at least 30 minutes after po intake    Other  Recommendations Recommended Consults: Consider esophageal assessment Oral Care Recommendations: Oral care BID Other Recommendations: Clarify dietary restrictions   Follow up Recommendations Other (comment) (TBD)      Frequency and Duration  (TBD)          Prognosis Prognosis for Safe Diet Advancement: Fair Barriers to Reach Goals:  (esophageal issues)      Swallow Study   General HPI: Pt is a 83 y.o. female with PMH of COPD, Anxiety Depression, diastolic CHF, Afib, HTN, hypothyroidism, Barretts esophagus, CAD. Presented with 2 week history of worsening lightheadedness gyrus weakness secondary to decreased by mouth intake. Patient had been having prolonged history of pain with swallowing she decreased her by mouth intake didn't provider manage her pain. Progressively became weaker and now only able to stand with assistance as she does not have baseline daughter called primary care provider who recommended patient to be taken to emergency department EMS was called. Mild confusion at baseline. Reports food getting stuck in throat and has to gag to get it back up. Reports trouble swallowing has been gradual the past few months starting with alrge pieces of food; currently can only swallow fluids but still takes a long time to get them down. CXR negative for acute findings. EGD 2010 revealed barrett's esophagus in distal esophagus 33-36 cm in length, hiatal hernia. Bedside swallow eval ordered. Type of Study: Bedside Swallow Evaluation Previous Swallow Assessment: none in chart Diet Prior to this Study: Thin liquids Temperature Spikes Noted: No Respiratory Status: Room air History of Recent Intubation: No Behavior/Cognition: Alert;Cooperative;Pleasant mood Oral Cavity Assessment: Within Functional Limits Oral Care Completed by  SLP: No Oral Cavity - Dentition: Adequate natural dentition Vision: Functional for self-feeding Self-Feeding Abilities: Able to feed self Patient Positioning: Upright in chair Baseline Vocal Quality: Normal Volitional Cough: Strong    Oral/Motor/Sensory Function Overall Oral Motor/Sensory Function: Within functional limits   Ice Chips Ice chips: Not tested   Thin Liquid Thin Liquid: Impaired Presentation: Straw Pharyngeal  Phase Impairments: Multiple swallows;Throat Clearing - Immediate;Cough - Immediate    Nectar Thick Nectar Thick Liquid: Not tested   Honey Thick Honey Thick Liquid: Not tested   Puree Puree: Impaired Pharyngeal Phase Impairments: Multiple swallows   Solid   GO   Solid: Impaired Pharyngeal Phase Impairments: Multiple swallows        Ilithyia Titzer Cecille AverK Deunta Beneke, MA, CCC-SLP 11/17/2016,10:08 AM [phone]

## 2016-11-17 NOTE — Progress Notes (Signed)
PROGRESS NOTE    Jasmine Dakinmma A Kirst  MVH:[id number] DOB: 02/06/1923 DOA: 11/16/2016 PCP: Lorre MunroeBaity, Regina W, NP   Brief Narrative: Jasmine [name] is a 83 y.o. female with a history of COPD, anxiety, depression, diastolic heart failure, atrial fibrillation, hypertension, hypothyroidism, Barrett's esophagus, CAD. She presented with lightheadedness and generalized weakness.   Assessment & Plan:   Active Problems:   Barrett's esophagus   CAD - CABG '97, cath OK 2005, Myoview low risk 12/12   HTN (hypertension)   Chronic atrial fibrillation (HCC)   Carotid artery disease (HCC)   Chronic diastolic CHF (congestive heart failure) (HCC)   Chronic respiratory failure with hypoxia (HCC)   Dysphagia   Odynophagia   Dehydration   Pain with swallowing   Odynophagia Dysphagia GI consulted. Unknown etiology. Patient stating a feeling of food being stuck. No regurgitation per patient. -GI recommendations: ?EGD -SLP eval  Chronic atrial fibrillation Rapid ventricular response overnight -continue cardizem -metoprolol IV prn  Chronic diastolic heart failure Initially dry. No overload. -daily weight -in/out  Essential hypertension Controlled. -continue diltiazem  History of chronic respiratory failure Resolved. Patient on room air.  CAD History of CABG. Mose recent Myoview low risk -continue atorvastatin  Barrett's esophagus -continue Protonix  Malnutrition -dietary recommendations  Elevated troponin Flat trend. No chest pain. -echo pending  Weakness Patient is frail and elderly. -Pt eval: SNF   DVT prophylaxis: SCDs Code Status: DNR/DNI Family Communication: None at bedside Disposition Plan: Discharge pending GI workup   Consultants:   Gastroenterology  Procedures:   None  Antimicrobials:  None    Subjective: No chest pain or palpitations. No dyspnea. Dysphagia.  Objective: Vitals:   11/17/16 0200 11/17/16 0237 11/17/16 0455 11/17/16 0933  BP: 126/69  106/74 125/67 119/86  Pulse: (!) 108 (!) 123 (!) 124   Resp: (!) 28 17 16    Temp:  97.9 F (36.6 C) 98.1 F (36.7 C)   TempSrc:  Oral Oral   SpO2: 95% 95% 95%   Weight:  40.8 kg (90 lb)    Height:  5\' 3"  (1.6 m)      Intake/Output Summary (Last 24 hours) at 11/17/16 1323 Last data filed at 11/17/16 0636  Gross per 24 hour  Intake             2515 ml  Output                0 ml  Net             2515 ml   Filed Weights   11/17/16 0237  Weight: 40.8 kg (90 lb)    Examination:  General exam: Appears calm and comfortable, thin. Respiratory system: Clear to auscultation. Respiratory effort normal. Cardiovascular system: S1 & S2 heard, rapid rate, irregular rhythm. No murmurs, rubs, gallops or clicks. Gastrointestinal system: Abdomen is nondistended, soft and nontender. No organomegaly or masses felt. Normal bowel sounds heard. Central nervous system: Alert and oriented. Extremities: No edema. No calf tenderness Skin: No cyanosis. No rashes Psychiatry: Judgement and insight appear normal. Mood & affect appropriate.     Data Reviewed: I have personally reviewed following labs and imaging studies  CBC:  Recent Labs Lab 11/16/16 1350 11/17/16 0336  WBC 8.0 7.1  NEUTROABS 6.8  --   HGB 13.7 13.6  HCT 42.8 41.3  MCV 94.1 93.4  PLT 148* 134*   Basic Metabolic Panel:  Recent Labs Lab 11/16/16 1350 11/17/16 0336 11/17/16 0632  NA 139 141  --  K 4.7 3.7  --   CL 100* 103  --   CO2 28 27  --   GLUCOSE 121* 85  --   BUN 20 15  --   CREATININE 1.13* 0.92  --   CALCIUM 8.8* 8.1*  --   MG 2.1  --  1.9  PHOS  --   --  3.2   GFR: Estimated Creatinine Clearance: 25.1 mL/min (by C-G formula based on SCr of 0.92 mg/dL). Liver Function Tests:  Recent Labs Lab 11/16/16 1350 11/17/16 0336  AST 194* 184*  ALT 106* 102*  ALKPHOS 89 82  BILITOT 1.1 1.0  PROT 6.4* 5.8*  ALBUMIN 3.0* 2.6*   No results for input(s): LIPASE, AMYLASE in the last 168 hours. No results  for input(s): AMMONIA in the last 168 hours. Coagulation Profile: No results for input(s): INR, PROTIME in the last 168 hours. Cardiac Enzymes:  Recent Labs Lab 11/16/16 1350 11/16/16 1939 11/17/16 0336 11/17/16 0632  CKTOTAL  --  912*  --   --   TROPONINI 0.05* 0.06* 0.06* 0.06*   BNP (last 3 results) No results for input(s): PROBNP in the last 8760 hours. HbA1C: No results for input(s): HGBA1C in the last 72 hours. CBG: No results for input(s): GLUCAP in the last 168 hours. Lipid Profile: No results for input(s): CHOL, HDL, LDLCALC, TRIG, CHOLHDL, LDLDIRECT in the last 72 hours. Thyroid Function Tests:  Recent Labs  11/17/16 0336  TSH 1.311   Anemia Panel: No results for input(s): VITAMINB12, FOLATE, FERRITIN, TIBC, IRON, RETICCTPCT in the last 72 hours. Sepsis Labs:  Recent Labs Lab 11/16/16 1350 11/17/16 0336  LATICACIDVEN 2.0* 0.8    No results found for this or any previous visit (from the past 240 hour(s)).       Radiology Studies: Dg Chest 2 View  Result Date: 11/16/2016 CLINICAL DATA:  Generalized weakness for 2 weeks. EXAM: CHEST  2 VIEW COMPARISON:  08/11/2016 FINDINGS: Chronic coarse lung markings. Few patchy densities at the lung bases may be chronic. Heart and mediastinum are within normal limits. Thoracic aorta is heavily calcified. Prior median sternotomy and evidence of prior CABG procedure. No large areas of airspace disease or consolidation. No large pleural effusions. IMPRESSION: Chronic lung changes without acute findings. Aortic atherosclerosis. Electronically Signed   By: Richarda Overlie M.D.   On: 11/16/2016 16:33   Ct Head Wo Contrast  Result Date: 11/16/2016 CLINICAL DATA:  Generalized weakness.  Unable to stand. EXAM: CT HEAD WITHOUT CONTRAST TECHNIQUE: Contiguous axial images were obtained from the base of the skull through the vertex without intravenous contrast. COMPARISON:  CT head without contrast 07/16/2016 FINDINGS: Brain: Moderate  atrophy and white matter disease is stable. No acute infarct, hemorrhage, or mass lesion is present. Remote is infarcts of the thalami are again noted. The basal ganglia are otherwise intact. Insular ribbon is normal bilaterally. The ventricles are proportionate to the degree of atrophy. No significant extra-axial fluid collection is present. The brainstem and cerebellum are otherwise within normal limits. Vascular: Atherosclerotic calcifications are present within the cavernous internal carotid artery's bilaterally. Calcifications are noted at the dural margin of the vertebral arteries. There is no hyperdense vessel. Skull: The calvarium is intact. No focal lytic or blastic lesions are present. Sinuses/Orbits: The paranasal sinuses and mastoid air cells are clear. Bilateral lens replacements are present. The globes and orbits are within normal limits bilaterally. IMPRESSION: 1. Stable moderate atrophy and white matter disease. This likely reflects the sequela of chronic  microvascular ischemia. 2. Remote lacunar infarcts involving the thalami bilaterally. 3. No acute intracranial abnormality. 4. Atherosclerosis. Electronically Signed   By: Marin Robertshristopher  Mattern M.D.   On: 11/16/2016 16:41        Scheduled Meds: . atorvastatin  80 mg Oral Daily  . diltiazem  120 mg Oral Daily  . folic acid  1 mg Oral Daily  . lactose free nutrition  237 mL Oral BID BM  . levothyroxine  88 mcg Oral QAC breakfast  . mirtazapine  45 mg Oral QHS  . pantoprazole (PROTONIX) IV  40 mg Intravenous Q24H  . QUEtiapine  25 mg Oral QHS  . senna  1 tablet Oral BID  . sertraline  50 mg Oral Daily  . thiamine  100 mg Oral Daily   Continuous Infusions:   LOS: 1 day     Jacquelin Hawkingalph Trinty Marken, MD [facility] 11/17/2016, 1:23 PM Pager: [phone]  If 7PM-7AM, please contact night-coverage [email or web address] Password [email or web address] 11/17/2016, 1:23 PM

## 2016-11-17 NOTE — Evaluation (Signed)
Physical Therapy Evaluation Patient Details Name: Jasmine [name] MRN: [id number] DOB: [birth year] Today's Date: 11/17/2016   History of Present Illness  Pt admitted with weakness and difficulty/painful swallowing with poor intake. PMH: COPD, anxiety, depression, CHF, afib, CAD with CABG, hypothyroidism, barretts esophagus, mild memory deficit.  Clinical Impression  Patient presents with problems listed below.  Will benefit from acute PT to maximize functional mobility prior to discharge.  Patient very weak with poor activity tolerance and mobility.  Recommend SNF at d/c for continued therapy prior to returning home with daughter.    Follow Up Recommendations SNF;Supervision/Assistance - 24 hour    Equipment Recommendations  None recommended by PT    Recommendations for Other Services       Precautions / Restrictions Precautions Precautions: Fall Restrictions Weight Bearing Restrictions: No      Mobility  Bed Mobility Overal bed mobility: Needs Assistance Bed Mobility: Supine to Sit;Sit to Supine     Supine to sit: Min assist Sit to supine: Min assist   General bed mobility comments: Min assist for LEs over EOB and to raise trunk, pt using rail.  Patient with fair sitting balance. Assist to bring LE's onto bed to return to supine.  Transfers                 General transfer comment: NT - patient declined  Ambulation/Gait                Stairs            Wheelchair Mobility    Modified Rankin (Stroke Patients Only)       Balance Overall balance assessment: Needs assistance   Sitting balance-Leahy Scale: Fair Sitting balance - Comments: Patient with significant kyphosis in sitting with forward head.  Patient unable to raise head - causes back pain.  Able to perform LE movements without loss of balance.  Patient became lightheaded and requested to return to supine.  Low activity tolerance.                                      Pertinent Vitals/Pain Pain Assessment: No/denies pain    Home Living Family/patient expects to be discharged to:: Private residence Living Arrangements: Children (Daughter per patient is disabled) Available Help at Discharge: Family;Available 24 hours/day Type of Home: House Home Access: Stairs to enter Entrance Stairs-Rails: Doctor, general practiceight;Left Entrance Stairs-Number of Steps: 3 Home Layout: One level Home Equipment: Walker - 2 wheels;Cane - single point (Use a chair in the shower)      Prior Function Level of Independence: Independent with assistive device(s);Needs assistance   Gait / Transfers Assistance Needed: uses RW,  ADL's / Homemaking Assistance Needed: pt sponge bathes with set up, toilets and self feeds, family helps with IADL        Hand Dominance   Dominant Hand: Right    Extremity/Trunk Assessment   Upper Extremity Assessment Upper Extremity Assessment: Defer to OT evaluation    Lower Extremity Assessment Lower Extremity Assessment: Generalized weakness (Hip flex 3-/5, knee ext 3/5, DF 3-/5)    Cervical / Trunk Assessment Cervical / Trunk Assessment: Kyphotic  Communication   Communication: No difficulties  Cognition Arousal/Alertness: Awake/alert Behavior During Therapy: Anxious Overall Cognitive Status: History of cognitive impairments - at baseline  General Comments: hx of mild memory deficits      General Comments      Exercises     Assessment/Plan    PT Assessment Patient needs continued PT services  PT Problem List Decreased strength;Decreased activity tolerance;Decreased balance;Decreased mobility;Decreased knowledge of use of DME;Cardiopulmonary status limiting activity       PT Treatment Interventions Gait training;Functional mobility training;Therapeutic activities;Therapeutic exercise;Balance training;Patient/family education    PT Goals (Current goals can be found in the Care Plan  section)  Acute Rehab PT Goals Patient Stated Goal: to get stronger PT Goal Formulation: With patient Time For Goal Achievement: 11/24/16 Potential to Achieve Goals: Fair    Frequency Min 3X/week   Barriers to discharge Decreased caregiver support Daughter unable to provide physical assist for patient due to disabilities.    Co-evaluation               AM-PAC PT "6 Clicks" Daily Activity  Outcome Measure Difficulty turning over in bed (including adjusting bedclothes, sheets and blankets)?: None Difficulty moving from lying on back to sitting on the side of the bed? : Total Difficulty sitting down on and standing up from a chair with arms (e.g., wheelchair, bedside commode, etc,.)?: Total Help needed moving to and from a bed to chair (including a wheelchair)?: A Little Help needed walking in hospital room?: A Lot Help needed climbing 3-5 steps with a railing? : A Lot 6 Click Score: 13    End of Session   Activity Tolerance: Patient limited by fatigue Patient left: in bed;with call bell/phone within reach;with bed alarm set;with family/visitor present Nurse Communication: Mobility status (Daughter wants to speak with RN) PT Visit Diagnosis: Unsteadiness on feet (R26.81);Difficulty in walking, not elsewhere classified (R26.2);Muscle weakness (generalized) (M62.81);Adult, failure to thrive (R62.7)    Time: 1200-1228 PT Time Calculation (min) (ACUTE ONLY): 28 min   Charges:   PT Evaluation $PT Eval Moderate Complexity: 1 Mod PT Treatments $Therapeutic Activity: 8-22 mins   PT G Codes:        Durenda HurtSusan H. Renaldo Fiddleravis, PT, [facility] CenterMBA Acute Rehab Services Pager [phone]   Vena AustriaSusan H Geraldyn Shain 11/17/2016, 1:44 PM

## 2016-11-17 NOTE — Progress Notes (Signed)
NURSING PROGRESS NOTE  Neal Dymma A FarlowMRN: [id number] Admission Data: 11/17/16 at 0240 Attending Provider: Therisa Doyneoutova, Anastassia, MD PCP: Lorre MunroeBaity, Regina W, NP Code status: DNR  Allergies:  Allergies  Allergen Reactions  . Codeine     Reaction unknown to patient  . Meperidine Hcl     Reaction unknown to patient  . Oxycodone-Acetaminophen     Reaction unknown to patient   Past Medical History:  Past Medical History:  Diagnosis Date  . Anxiety   . Atrial fibrillation with RVR (HCC)   . Barrett's esophagus   . CAD (coronary artery disease) of artery bypass graft 1995  . Fracture of right shoulder 02/2008  . GERD (gastroesophageal reflux disease)   . Hemorrhoids, external   . Hiatal hernia   . History of colon polyps   . Hyperlipidemia   . Hypertension   . Hypothyroidism   . Insomnia due to medical condition   . Left wrist fracture 09/2007  . Low back pain   . Syncope    Past Surgical History:  Past Surgical History:  Procedure Laterality Date  . CARDIAC CATHETERIZATION  02/25/1996   No intervention-recommend CABG. Left main-80% concentric distal stenosis, LAD-99% ostial stenosis, first diag-80% segmental proximal stenosis, Circumflex-80% ostial stenosis, RCA-90% ostial stenosis.  Marland Kitchen. CARDIAC CATHETERIZATION  05/21/2003   No intervention-widely patent grafts.  . CAROTID DOPPLER Bilateral 05/15/2010   Right Bulb/CEA-demonstrated trace irregular nonhemodynamically significant plaque 0-49%. Left Bulb/Proximal ICA-demonstrated irregular nonhemodynamically signifcant plaque 0-49%.  . CAROTID ENDARTERECTOMY    . CATARACT EXTRACTION    . COLONOSCOPY W/ POLYPECTOMY  06/2005  . CORONARY ARTERY BYPASS GRAFT  1997  . LEXISCAN MYOVIEW  04/03/2011   No scintigraphic evidence of inducible myocardial ischemia. No lexiscan EKG changes. Non-diagnostic for ischemia.   . TRANSTHORACIC ECHOCARDIOGRAM  01/08/2011   EF 50-55%, LA moderately dilated, moderate tricuspid regurg, mild pulmonary  hypertension.   Griffin Dakinmma A Hollern is a 83 y.o. female patient, arrived to floor in room [id number] via stretcher, transferred from ED. Patient alert and oriented X 3-4. No acute distress noted. Denies pain.   Vital signs: Oral temperature 97.9 F (36.6 C), Blood pressure 106/74, Pulse 123, RR 17, SpO2 95 % on room air. Height 5'3", weight 90 lbs (40.8 kg).   Cardiac monitoring: Telemetry box 5W # 31 in place. Second verified by Colan NeptuneMichael Overby,RN  IV access: Right AC-infusing Normal Saline at 16100mL/hr; condition patent and no redness.  Skin: intact, no pressure ulcer noted in sacral area. Abrasions noted on right side of face and left upper side of chest. Redness noted on heels and sacrum, but all blanchable. Second verified by Colan NeptuneMichael Overby,RN.  Patient's ID armband verified with patient and in place. Information packet given to patient. Fall risk assessed, SR up X2, patient able to verbalize understanding of risks associated with falls and to call nurse or staff to assist before getting out of bed. Patient oriented to room and equipment. Call bell within reach.

## 2016-11-17 NOTE — Evaluation (Signed)
Occupational Therapy Evaluation Patient Details Name: Jasmine [name] MRN: [id number] DOB: 10/01/1922 Today's Date: 11/17/2016    History of Present Illness Pt admitted with weakness and difficulty/painful swallowing with poor intake. PMH: COPD, anxiety, depression, CHF, afib, CAD with CABG, hypothyroidism, barretts esophagus, mild memory deficit.   Clinical Impression   Pt sponge bathed, dressed and performed grooming with set up to minimal assist. She ambulated with a RW. Pt reports being home from rehab in SNF x 1 month prior to this admission. Pt presents with generalized weakness, impaired standing balance and decreased activity tolerance. She requires min assist for mobility and set up to max assist for ADL. Pt reports her daughter cannot provide physical assist for mobility. Recommending SNF, but pt may progress to supervision level for home discharge.     Follow Up Recommendations  SNF;Supervision/Assistance - 24 hour (may progress to be able to return home)    Equipment Recommendations  None recommended by OT    Recommendations for Other Services       Precautions / Restrictions Precautions Precautions: Fall Restrictions Weight Bearing Restrictions: No      Mobility Bed Mobility Overal bed mobility: Needs Assistance Bed Mobility: Supine to Sit     Supine to sit: Min assist     General bed mobility comments: min assist for LEs over EOB and to raise trunk, pt using rail  Transfers Overall transfer level: Needs assistance Equipment used: 1 person hand held assist Transfers: Sit to/from [employer]Stand;Stand Pivot Transfers Sit to Stand: Min assist Stand pivot transfers: Min assist       General transfer comment: assist to rise and steady from bed and BSC    Balance Overall balance assessment: Needs assistance   Sitting balance-Leahy Scale: Fair       Standing balance-Leahy Scale: Poor Standing balance comment: requires external assist                            ADL either performed or assessed with clinical judgement   ADL Overall ADL's : Needs assistance/impaired Eating/Feeding: Independent;Sitting   Grooming: Wash/dry hands;Wash/dry face;Sitting;Set up   Upper Body Bathing: Minimal assistance;Sitting   Lower Body Bathing: Moderate assistance;Sit to/from stand   Upper Body Dressing : Minimal assistance;Sitting   Lower Body Dressing: Moderate assistance;Sit to/from stand   Toilet Transfer: Minimal assistance;Stand-pivot;BSC   Toileting- Clothing Manipulation and Hygiene: Maximal assistance;Sit to/from stand               Vision Baseline Vision/History: Wears glasses Wears Glasses: At all times Patient Visual Report: No change from baseline       Perception     Praxis      Pertinent Vitals/Pain Pain Assessment: No/denies pain     Hand Dominance Right   Extremity/Trunk Assessment Upper Extremity Assessment Upper Extremity Assessment: Generalized weakness   Lower Extremity Assessment Lower Extremity Assessment: Defer to PT evaluation   Cervical / Trunk Assessment Cervical / Trunk Assessment: Kyphotic   Communication Communication Communication: No difficulties   Cognition Arousal/Alertness: Awake/alert Behavior During Therapy: Anxious Overall Cognitive Status: History of cognitive impairments - at baseline                                 General Comments: hx of mild memory deficits   General Comments       Exercises     Shoulder Instructions  Home Living Family/patient expects to be discharged to:: Private residence Living Arrangements: Children Available Help at Discharge: Family;Available 24 hours/day Type of Home: House Home Access: Stairs to enter Enter[employer] CorporationEntrance Stairs-Number of Steps: 3 Entrance Stairs-Rails: Right;Left Home Layout: One level               Home Equipment: Walker - 2 wheels          Prior Functioning/Environment Level of Independence:  Independent with assistive device(s);Needs assistance  Gait / Transfers Assistance Needed: uses RW, ADL's / Homemaking Assistance Needed: pt sponge bathes with set up, toilets and self feeds, family helps with IADL            OT Problem List: Decreased strength;Decreased activity tolerance;Impaired balance (sitting and/or standing);Decreased cognition;Decreased knowledge of use of DME or AE      OT Treatment/Interventions: Self-care/ADL training;DME and/or AE instruction;Therapeutic activities;Patient/family education;Balance training    OT Goals(Current goals can be found in the care plan section) Acute Rehab OT Goals Patient Stated Goal: to get stronger OT Goal Formulation: With patient Time For Goal Achievement: 12/01/16 Potential to Achieve Goals: Good ADL Goals Pt Will Perform Grooming: with supervision;standing Pt Will Perform Upper Body Dressing: with set-up;sitting Pt Will Perform Lower Body Dressing: with supervision;sit to/from stand Pt Will Transfer to Toilet: with supervision;ambulating;regular height toilet Pt Will Perform Toileting - Clothing Manipulation and hygiene: with supervision;sit to/from stand  OT Frequency: Min 2X/week   Barriers to D/C: Decreased caregiver support (reports daughter cannot physically assist with mobility)          Co-evaluation              AM-PAC PT "6 Clicks" Daily Activity     Outcome Measure Help from another person eating meals?: None Help from another person taking care of personal grooming?: A Little Help from another person toileting, which includes using toliet, bedpan, or urinal?: A Lot Help from another person bathing (including washing, rinsing, drying)?: A Lot Help from another person to put on and taking off regular upper body clothing?: A Little Help from another person to put on and taking off regular lower body clothing?: A Lot 6 Click Score: 16   End of Session Equipment Utilized During Treatment: Gait  belt Nurse Communication: Mobility status  Activity Tolerance: Patient limited by fatigue Patient left: in chair;with call bell/phone within reach;with chair alarm set (ST in room)  OT Visit Diagnosis: Unsteadiness on feet (R26.81);Muscle weakness (generalized) (M62.81);Other symptoms and signs involving cognitive function                Time: 0922-0940 OT Time Calculation (min): 18 min Charges:  OT General Charges $OT Visit: 1 Procedure OT Evaluation $OT Eval Moderate Complexity: 1 Procedure G-Codes:     Evern BioMayberry, Brook[place]inn Longbottom Lynn 11/17/2016, 11:06 AM  [phone]

## 2016-11-17 NOTE — Progress Notes (Signed)
Received report from [facility] Ltd[facility]ario,RN in ED.

## 2016-11-18 ENCOUNTER — Inpatient Hospital Stay (HOSPITAL_COMMUNITY): Payer: Medicare Other

## 2016-11-18 DIAGNOSIS — E43 Unspecified severe protein-calorie malnutrition: Secondary | ICD-10-CM

## 2016-11-18 DIAGNOSIS — K2270 Barrett's esophagus without dysplasia: Secondary | ICD-10-CM

## 2016-11-18 DIAGNOSIS — J9611 Chronic respiratory failure with hypoxia: Secondary | ICD-10-CM

## 2016-11-18 DIAGNOSIS — R945 Abnormal results of liver function studies: Secondary | ICD-10-CM

## 2016-11-18 DIAGNOSIS — E7800 Pure hypercholesterolemia, unspecified: Secondary | ICD-10-CM

## 2016-11-18 DIAGNOSIS — R7989 Other specified abnormal findings of blood chemistry: Secondary | ICD-10-CM

## 2016-11-18 DIAGNOSIS — R740 Nonspecific elevation of levels of transaminase and lactic acid dehydrogenase [LDH]: Secondary | ICD-10-CM

## 2016-11-18 DIAGNOSIS — R634 Abnormal weight loss: Secondary | ICD-10-CM

## 2016-11-18 DIAGNOSIS — E039 Hypothyroidism, unspecified: Secondary | ICD-10-CM

## 2016-11-18 DIAGNOSIS — I5032 Chronic diastolic (congestive) heart failure: Secondary | ICD-10-CM

## 2016-11-18 DIAGNOSIS — E860 Dehydration: Secondary | ICD-10-CM

## 2016-11-18 DIAGNOSIS — R1310 Dysphagia, unspecified: Secondary | ICD-10-CM

## 2016-11-18 DIAGNOSIS — I2510 Atherosclerotic heart disease of native coronary artery without angina pectoris: Secondary | ICD-10-CM

## 2016-11-18 DIAGNOSIS — D696 Thrombocytopenia, unspecified: Secondary | ICD-10-CM

## 2016-11-18 DIAGNOSIS — R531 Weakness: Secondary | ICD-10-CM

## 2016-11-18 DIAGNOSIS — I482 Chronic atrial fibrillation: Secondary | ICD-10-CM

## 2016-11-18 LAB — CBC WITH DIFFERENTIAL/PLATELET
BASOS ABS: 0 10*3/uL (ref 0.0–0.1)
BASOS PCT: 0 %
Eosinophils Absolute: 0.1 10*3/uL (ref 0.0–0.7)
Eosinophils Relative: 2 %
HCT: 43.5 % (ref 36.0–46.0)
HEMOGLOBIN: 14.1 g/dL (ref 12.0–15.0)
Lymphocytes Relative: 8 %
Lymphs Abs: 0.7 10*3/uL (ref 0.7–4.0)
MCH: 30.1 pg (ref 26.0–34.0)
MCHC: 32.4 g/dL (ref 30.0–36.0)
MCV: 92.8 fL (ref 78.0–100.0)
Monocytes Absolute: 0.5 10*3/uL (ref 0.1–1.0)
Monocytes Relative: 5 %
NEUTROS PCT: 85 %
Neutro Abs: 7.1 10*3/uL (ref 1.7–7.7)
Platelets: 145 10*3/uL — ABNORMAL LOW (ref 150–400)
RBC: 4.69 MIL/uL (ref 3.87–5.11)
RDW: 13.8 % (ref 11.5–15.5)
WBC: 8.4 10*3/uL (ref 4.0–10.5)

## 2016-11-18 LAB — HEPATITIS PANEL, ACUTE
HEP A IGM: NEGATIVE
Hep B C IgM: NEGATIVE
Hepatitis B Surface Ag: NEGATIVE

## 2016-11-18 LAB — COMPREHENSIVE METABOLIC PANEL
ALBUMIN: 2.5 g/dL — AB (ref 3.5–5.0)
ALK PHOS: 74 U/L (ref 38–126)
ALT: 110 U/L — AB (ref 14–54)
AST: 211 U/L — ABNORMAL HIGH (ref 15–41)
Anion gap: 11 (ref 5–15)
BUN: 16 mg/dL (ref 6–20)
CALCIUM: 8.3 mg/dL — AB (ref 8.9–10.3)
CO2: 20 mmol/L — AB (ref 22–32)
CREATININE: 0.81 mg/dL (ref 0.44–1.00)
Chloride: 105 mmol/L (ref 101–111)
GFR calc Af Amer: >60 mL/min (ref >60)
GFR calc non Af Amer: >60 mL/min (ref >60)
GLUCOSE: 69 mg/dL (ref 65–99)
Potassium: 3.9 mmol/L (ref 3.5–5.1)
SODIUM: 136 mmol/L (ref 135–145)
Total Bilirubin: 1.4 mg/dL — ABNORMAL HIGH (ref 0.3–1.2)
Total Protein: 5.8 g/dL — ABNORMAL LOW (ref 6.5–8.1)

## 2016-11-18 LAB — PHOSPHORUS: Phosphorus: 3.2 mg/dL (ref 2.5–4.6)

## 2016-11-18 LAB — MAGNESIUM: Magnesium: 1.9 mg/dL (ref 1.7–2.4)

## 2016-11-18 MED ORDER — ORAL CARE MOUTH RINSE
15.0000 mL | Freq: Two times a day (BID) | OROMUCOSAL | Status: DC
  Administered 2016-11-19: 15 mL via OROMUCOSAL

## 2016-11-18 MED ORDER — CHLORHEXIDINE GLUCONATE 0.12 % MT SOLN
15.0000 mL | Freq: Two times a day (BID) | OROMUCOSAL | Status: DC
  Administered 2016-11-18 – 2016-11-19 (×3): 15 mL via OROMUCOSAL
  Filled 2016-11-18 (×3): qty 15

## 2016-11-18 MED ORDER — FLUCONAZOLE 100 MG PO TABS
200.0000 mg | ORAL_TABLET | Freq: Once | ORAL | Status: AC
  Administered 2016-11-18: 200 mg via ORAL
  Filled 2016-11-18: qty 2

## 2016-11-18 MED ORDER — FLUCONAZOLE 100 MG PO TABS
100.0000 mg | ORAL_TABLET | Freq: Every day | ORAL | Status: DC
  Administered 2016-11-19: 100 mg via ORAL
  Filled 2016-11-18: qty 1

## 2016-11-18 MED ORDER — RESOURCE THICKENUP CLEAR PO POWD
ORAL | Status: DC | PRN
  Filled 2016-11-18: qty 125

## 2016-11-18 NOTE — Clinical Social Work Note (Signed)
Clinical Social Work Assessment  Patient Details  Name: Jasmine [name] MRN: [id number] Date of Birth: [birth year]  Date of referral:  11/18/16               Reason for consult:  Facility Placement                Permission sought to share information with:  Facility Medical sales representativeContact Representative, Family Supports Permission granted to share information::  No  Name::     Public affairs consultantDebbie  Agency::  SNFs  Relationship::  Daughter  Contact Information:  [phone]  Housing/Transportation Living arrangements for the past 2 months:  Single Family Home Source of Information:  Adult Children Patient Interpreter Needed:  None Criminal Activity/Legal Involvement Pertinent to Current Situation/Hospitalization:  No - Comment as needed Significant Relationships:  Adult Children Lives with:  Adult Children Do you feel safe going back to the place where you live?  No Need for family participation in patient care:  Yes (Comment)  Care giving concerns:  CSW received consult for possible SNF placement at time of discharge. CSW spoke with patient's daughter, [name], regarding PT recommendation of SNF placement at time of discharge. Patient's daughter reported that she is currently unable to care for patient at their home given patient's current physical needs and fall risk. Patient's daughter expressed understanding of PT recommendation and is agreeable to SNF placement at time of discharge. CSW to continue to follow and assist with discharge planning needs.   Social Worker assessment / plan:  CSW spoke with patient's [facility].  Employment status:  Retired Health and safety inspectornsurance information:  Medicare PT Recommendations:  Skilled Nursing Facility Information / Referral to community resources:  Skilled Nursing Facility  Patient/Family's Response to care:  Patient's daughter recognizes need for rehab before returning home and [place] Patient  [facility] patient [facility] earlier this year.  Patient/Family's Understanding of and Emotional Response to Diagnosis, Current Treatment, and Prognosis:  Patient/family is realistic regarding therapy needs and expressed being hopeful for SNF placement. Patient expressed understanding of CSW role and discharge process as well as patient's medical condition. No questions/concerns about plan or treatment.    Emotional Assessment Appearance:  Appears stated age Attitude/Demeanor/Rapport:  Unable to Assess Affect (typically observed):  Unable to Assess Orientation:  Oriented to Self Alcohol / Substance use:  Not Applicable Psych involvement (Current and /or in the community):  No (Comment)  Discharge Needs  Concerns to be addressed:  Care Coordination Readmission within the last 30 days:  No Current discharge risk:  None Barriers to Discharge:  Continued Medical Work up   [employer] Ashanti Littles, LCSWA 11/18/2016, 9:19 AM

## 2016-11-18 NOTE — Progress Notes (Signed)
PROGRESS NOTE    GRADY LUCCI  ZOX:[id number] DOB: 17-May-1922 DOA: 11/16/2016 PCP: Lorre Munroe, NP  Brief Narrative:  TANAIRY PAYEUR is a 83 y.o. female with a history of COPD, anxiety, depression, diastolic heart failure, atrial fibrillation, hypertension, hypothyroidism, Barrett's esophagus, CAD. She presented with lightheadedness and generalized weakness. Was found to have Dysphagia and Gastroenterology was consulted and recommending MBS.   Assessment & Plan:   Active Problems:   Hypothyroidism   HLD (hyperlipidemia)   Barrett's esophagus   CAD - CABG '97, cath OK 2005, Myoview low risk 12/12   HTN (hypertension)   Chronic atrial fibrillation (HCC)   Carotid artery disease (HCC)   Chronic diastolic CHF (congestive heart failure) (HCC)   Thrombocytopenia (HCC)   Chronic respiratory failure with hypoxia (HCC)   Dysphagia   Odynophagia   Dehydration   Pain with swallowing   Protein-calorie malnutrition, severe   Abnormal LFTs  Odynophagia/Dysphagia -GI consulted. Unknown etiology. Patient stating a feeling of food being stuck. No regurgitation per patient. -GI recommendations: Modified Barium Swallow and Empirically treat for Oral Candidiasis  -Fluconazole Started by GI and given 200 mg po today and 100 mg po Starting  -SLP evaluated and recommending Dysphagia 1 Diet after MBS -Will defer to Gastroenterology for possible EGD  Chronic Atrial fibrillation -C/w Telemetry  -Had Rapid ventricular response overnight -Continue Cardizem 120 mg po Daily and Metoprolol 25 mg po BID -Add Metoprolol IV prn  Chronic Diastolic Heart Failure -Initially dry. Not in Volume Overload. -Continue to Monitor Strict I's and O's and Daily Weights -C/w Metoprolol 25 mg po BID  Essential Hypertension -Controlled. -Continue Diltiazem 120 mg po Daily and Metoprolol 25 mg po Daily  History of Chronic Respiratory Failure -Resolved. Patient on room air. -Continue with Pulse Oximetry and  Supplemental O2 as necesssary   CAD -History of CABG. Mose recent Myoview low risk -Held Atorvastatin given Starting Fluconazole   Hx of Barrett's Esophagus/GERD -Continue Protonix IV 40 mg q24h  Malnutrition -Appreciated Dietary Recommendations -C/w Lactose Free Nutrition 237 mL po TIDwm -C/w Resource Thickenup Clear po for Dysphagia 1 Diet  Elevated Troponin -Likely 2/2 to demand ischemia from Atrial Fibrillation -Flat trend at 0.06. No chest pain. -Echo ordered and pending  Generalized Weakness -Patient is frail and elderly. -Pt evaluated and recommending SNF -[facility] [facility] [employer] for Skilled Nursing Placement  Chronic Thrombocytopenia -Patient's Platelet Count went from 134 -> 145 -Repeat CBC in AM  Hyperlipidemia -Hold Atorvastatin 80 mg po qHS  Abnormal LFT's -Unclear Etiology -AST went from 194 -> 184 -> 211 -ALT went from 106 -> 102 -> 110 -Hepatitis Panel was Negative -Checked RUQ Ultrasound and showed no focal lesion identified. Liver was within normal limits in parenchymal echogenicity.  -Continue to Monitor and Repeat CMP in AM   Hypothyroidism -C/w Levothyroxine 88 mcg po Daily   DVT prophylaxis: SCDs Code Status: DO NOT RESUSCITATE Family Communication: No family present at bedside Disposition Plan: SNF when medically stable  Consultants:   [facility]   Procedures: MBS   Antimicrobials:  Anti-infectives    Start     Dose/Rate Route Frequency Ordered Stop   11/19/16 1000  fluconazole (DIFLUCAN) tablet 100 mg     100 mg Oral Daily 11/18/16 1106     11/18/16 1115  fluconazole (DIFLUCAN) tablet 200 mg     200 mg Oral  Once 11/18/16 1106 11/18/16 1245     Subjective: Seen and examined at bedside and had complaints of swallowing  food. No Nausea or vomiting. Denied CP. Was mildly SOB. No other complaints or concerns at this time.   Objective: Vitals:   11/17/16 2138 11/18/16 0604 11/18/16 0902 11/18/16 1433  BP: 135/70  138/70 (!) 152/96 (!) 113/58  Pulse: (!) 119 (!) 105 (!) 133 86  Resp: 16 17  16   Temp: 98.9 F (37.2 C) 98.3 F (36.8 C)  97.8 F (36.6 C)  TempSrc: Oral Axillary  Oral  SpO2: 94% 94%  97%  Weight:      Height:        Intake/Output Summary (Last 24 hours) at 11/18/16 1750 Last data filed at 11/18/16 1545  Gross per 24 hour  Intake                0 ml  Output              500 ml  Net             -500 ml   Filed Weights   11/17/16 0237  Weight: 40.8 kg (90 lb)   Examination: Physical Exam:  Constitutional: Thin, Cachetic Caucasian female in NAD and appears calm and comfortable Eyes: Lids and conjunctivae normal, sclerae anicteric  ENMT: External Ears, Nose appear normal. Grossly normal hearing. Mucous membranes are moist. Had some mild white exudates on tongue.  Neck: Appears normal, supple, no cervical masses, normal ROM, no appreciable thyromegaly, no JVD Respiratory: Diminished to auscultation bilaterally, no wheezing, rales, rhonchi or crackles. Normal respiratory effort and patient is not tachypenic. No accessory muscle use.  Cardiovascular: Irregularly Irregular, no appreciable murmurs / rubs / gallops. S1 and S2 auscultated. No extremity edema. 2+ pedal pulses. No carotid bruits.  Abdomen: Soft, non-tender, non-distended. No masses palpated. No appreciable hepatosplenomegaly. Bowel sounds positive.  GU: Deferred. Musculoskeletal: No clubbing / cyanosis of digits/nails. No joint deformity upper and lower extremities.  Skin: No rashes, lesions, ulcers on limited skin evaluation. No induration; Warm and dry.  Neurologic: CN 2-12 grossly intact with no focal deficits. Sensation intact in all 4 Extremities. Romberg sign cerebellar reflexes not assessed.  Psychiatric: Normal judgment and insight. Alert and oriented x 3. Normal mood and appropriate affect.   Data Reviewed: I have personally reviewed following labs and imaging studies  CBC:  Recent Labs Lab 11/16/16 1350  11/17/16 0336 11/18/16 0945  WBC 8.0 7.1 8.4  NEUTROABS 6.8  --  7.1  HGB 13.7 13.6 14.1  HCT 42.8 41.3 43.5  MCV 94.1 93.4 92.8  PLT 148* 134* 145*   Basic Metabolic Panel:  Recent Labs Lab 11/16/16 1350 11/17/16 0336 11/17/16 0632 11/18/16 0945  NA 139 141  --  136  K 4.7 3.7  --  3.9  CL 100* 103  --  105  CO2 28 27  --  20*  GLUCOSE 121* 85  --  69  BUN 20 15  --  16  CREATININE 1.13* 0.92  --  0.81  CALCIUM 8.8* 8.1*  --  8.3*  MG 2.1  --  1.9 1.9  PHOS  --   --  3.2 3.2   GFR: Estimated Creatinine Clearance: 28.5 mL/min (by C-G formula based on SCr of 0.81 mg/dL). Liver Function Tests:  Recent Labs Lab 11/16/16 1350 11/17/16 0336 11/18/16 0945  AST 194* 184* 211*  ALT 106* 102* 110*  ALKPHOS 89 82 74  BILITOT 1.1 1.0 1.4*  PROT 6.4* 5.8* 5.8*  ALBUMIN 3.0* 2.6* 2.5*   No results for input(s): LIPASE, AMYLASE  in the last 168 hours. No results for input(s): AMMONIA in the last 168 hours. Coagulation Profile: No results for input(s): INR, PROTIME in the last 168 hours. Cardiac Enzymes:  Recent Labs Lab 11/16/16 1350 11/16/16 1939 11/17/16 0336 11/17/16 0632  CKTOTAL  --  912*  --   --   TROPONINI 0.05* 0.06* 0.06* 0.06*   BNP (last 3 results) No results for input(s): PROBNP in the last 8760 hours. HbA1C: No results for input(s): HGBA1C in the last 72 hours. CBG: No results for input(s): GLUCAP in the last 168 hours. Lipid Profile: No results for input(s): CHOL, HDL, LDLCALC, TRIG, CHOLHDL, LDLDIRECT in the last 72 hours. Thyroid Function Tests:  Recent Labs  11/17/16 0336  TSH 1.311   Anemia Panel: No results for input(s): VITAMINB12, FOLATE, FERRITIN, TIBC, IRON, RETICCTPCT in the last 72 hours. Sepsis Labs:  Recent Labs Lab 11/16/16 1350 11/17/16 0336  LATICACIDVEN 2.0* 0.8    No results found for this or any previous visit (from the past 240 hour(s)).   Radiology Studies: Dg Esophagus  Result Date: 11/18/2016 CLINICAL  DATA:  Dysphasia.  History of Barrett's esophagus. EXAM: ESOPHOGRAM/BARIUM SWALLOW TECHNIQUE: Single contrast examination was performed using  thick barium. FLUOROSCOPY TIME:  Fluoroscopy Time:  1 minutes, 30 seconds. Number of Acquired Spot Images: 0 COMPARISON:  None. FINDINGS: Normal esophageal course and contour. There is reduced frequency of the normal esophageal peristalsis with increased tertiary contractions. There is no obstruction to the forward flow of contrast through the esophagus into the stomach. No esophageal stricture, ulceration, or other significant abnormality. IMPRESSION: 1. Reduced frequency of normal esophageal peristalsis and increased tertiary contractions, which may reflect presbyesophagus. 2. No evidence of esophageal stricture. Electronically Signed   By: Obie Dredge M.D.   On: 11/18/2016 14:32   Dg Swallowing Func-speech Pathology  Result Date: 11/18/2016 Objective Swallowing Evaluation: Type of Study: MBS-Modified Barium Swallow Study Patient Details Name: Jasmine [name] MRN: [id number] Date of Birth: April 12, 1923 Today's Date: 11/18/2016 Time: SLP Start Time (ACUTE ONLY): 1305-SLP Stop Time (ACUTE ONLY): 1330 SLP Time Calculation (min) (ACUTE ONLY): 25 min Past Medical History: Past Medical History: Diagnosis Date . Anxiety  . Atrial fibrillation with RVR (HCC)  . Barrett's esophagus  . CAD (coronary artery disease) of artery bypass graft 1995 . Fracture of right shoulder 02/2008 . GERD (gastroesophageal reflux disease)  . Hemorrhoids, external  . Hiatal hernia  . History of colon polyps  . Hyperlipidemia  . Hypertension  . Hypothyroidism  . Insomnia due to medical condition  . Left wrist fracture 09/2007 . Low back pain  . Syncope  Past Surgical History: Past Surgical History: Procedure Laterality Date . CARDIAC CATHETERIZATION  02/25/1996  No intervention-recommend CABG. Left main-80% concentric distal stenosis, LAD-99% ostial stenosis, first diag-80% segmental proximal stenosis,  Circumflex-80% ostial stenosis, RCA-90% ostial stenosis. Marland Kitchen CARDIAC CATHETERIZATION  05/21/2003  No intervention-widely patent grafts. . CAROTID DOPPLER Bilateral 05/15/2010  Right Bulb/CEA-demonstrated trace irregular nonhemodynamically significant plaque 0-49%. Left Bulb/Proximal ICA-demonstrated irregular nonhemodynamically signifcant plaque 0-49%. . CAROTID ENDARTERECTOMY   . CATARACT EXTRACTION   . COLONOSCOPY W/ POLYPECTOMY  06/2005 . CORONARY ARTERY BYPASS GRAFT  1997 . LEXISCAN MYOVIEW  04/03/2011  No scintigraphic evidence of inducible myocardial ischemia. No lexiscan EKG changes. Non-diagnostic for ischemia.  . TRANSTHORACIC ECHOCARDIOGRAM  01/08/2011  EF 50-55%, LA moderately dilated, moderate tricuspid regurg, mild pulmonary hypertension. HPI: Pt is a 83 y.o. female with PMH of COPD, Anxiety Depression, diastolic CHF, Afib, HTN, hypothyroidism, Barretts  esophagus, CAD. Presented with 2 week history of worsening lightheadedness gyrus weakness secondary to decreased by mouth intake. Patient had been having prolonged history of pain with swallowing she decreased her by mouth intake didn't provider manage her pain. Progressively became weaker and now only able to stand with assistance as she does not have baseline daughter called primary care provider who recommended patient to be taken to emergency department EMS was called. Mild confusion at baseline. Reports food getting stuck in throat and has to gag to get it back up. Reports trouble swallowing has been gradual the past few months starting with alrge pieces of food; currently can only swallow fluids but still takes a long time to get them down. CXR negative for acute findings. EGD 2010 revealed barrett's esophagus in distal esophagus 33-36 cm in length, hiatal hernia. Bedside swallow eval ordered. No Data Recorded Assessment / Plan / Recommendation CHL IP CLINICAL IMPRESSIONS 11/18/2016 Clinical Impression Pt with a moderate sensorimotor pharyngeal dysphagia  characterized by a delay in swallow initiation to the level of the pyriform sinuses with reduced base of tongue retraction/ reduced epiglottic inversion resulting in silent aspiration of thin liquids, penetration x1 of nectar-thick liquids. Cued pt to cough but cough was weak and pt was unable to clear all of aspirated material. Pt also with moderate residuals in the vallecula and UES; noted to have penetration of residuals post-swallow on 2 occasions. Chin tuck was effective on 4 out of 5 trials with nectar-thick liquids by cup in preventing penetration/ aspiration and clearing some vallecula residuals. Recommend initiating dysphagia 1/ puree diet with nectar-thick liquids by cup only (small sips at a time), all meds crushed in puree, utilize chin tuck strategy, multiple swallows, intermittent cough to clear possible penetrated/ aspirated material. Have pt sit upright 30 minutes after meal, provide supervision to cue strategies. Pt still remains at risk of aspirating these consistencies or aspirating post-swallow residuals. Will continue to follow for diet tolerance/ review of compensatory strategies.  SLP Visit Diagnosis Dysphagia, pharyngoesophageal phase (R13.14) Attention and concentration deficit following -- Frontal lobe and executive function deficit following -- Impact on safety and function Moderate aspiration risk;Severe aspiration risk   CHL IP TREATMENT RECOMMENDATION 11/18/2016 Treatment Recommendations Therapy as outlined in treatment plan below   Prognosis 11/18/2016 Prognosis for Safe Diet Advancement Good Barriers to Reach Goals -- Barriers/Prognosis Comment -- CHL IP DIET RECOMMENDATION 11/18/2016 SLP Diet Recommendations Dysphagia 1 (Puree) solids;Nectar thick liquid Liquid Administration via Cup;No straw Medication Administration Crushed with puree Compensations Slow rate;Small sips/bites;Multiple dry swallows after each bite/sip;Clear throat intermittently;Chin tuck Postural Changes Remain  semi-upright after after feeds/meals (Comment);Seated upright at 90 degrees   CHL IP OTHER RECOMMENDATIONS 11/18/2016 Recommended Consults -- Oral Care Recommendations Oral care BID Other Recommendations Order thickener from pharmacy;Clarify dietary restrictions   CHL IP FOLLOW UP RECOMMENDATIONS 11/18/2016 Follow up [facility]ations [facility] D[facility] 11/18/2016 Speech Therapy Frequency (ACUTE ONLY) min 3x week Treatment Duration 2 weeks      CHL IP ORAL PHASE 11/18/2016 Oral Phase WFL Oral - Pudding Teaspoon -- Oral - Pudding Cup -- Oral - Honey Teaspoon -- Oral - Honey Cup -- Oral - Nectar Teaspoon -- Oral - Nectar Cup -- Oral - Nectar Straw -- Oral - Thin Teaspoon -- Oral - Thin Cup -- Oral - Thin Straw -- Oral - Puree -- Oral - Mech Soft -- Oral - Regular -- Oral - Multi-Consistency -- Oral - Pill -- Oral Phase - Comment --  CHL IP PHARYNGEAL PHASE 11/18/2016 Pharyngeal Phase Impaired Pharyngeal- Pudding Teaspoon -- Pharyngeal -- Pharyngeal- Pudding Cup -- Pharyngeal -- Pharyngeal- Honey Teaspoon -- Pharyngeal -- Pharyngeal- Honey Cup -- Pharyngeal -- Pharyngeal- Nectar Teaspoon -- Pharyngeal -- Pharyngeal- Nectar Cup Delayed swallow initiation-pyriform sinuses;Reduced epiglottic inversion;Reduced tongue base retraction;Penetration/Aspiration during swallow;Pharyngeal residue - valleculae;Compensatory strategies attempted (with notebox) Pharyngeal Material enters airway, remains ABOVE vocal cords and not ejected out Pharyngeal- Nectar Straw -- Pharyngeal -- Pharyngeal- Thin Teaspoon -- Pharyngeal -- Pharyngeal- Thin Cup Delayed swallow initiation-pyriform sinuses;Reduced epiglottic inversion;Reduced airway/laryngeal closure;Reduced tongue base retraction;Penetration/Aspiration during swallow;Penetration/Apiration after swallow;Pharyngeal residue - valleculae Pharyngeal Material enters airway, passes BELOW cords without attempt by patient to eject out (silent aspiration) Pharyngeal-  Thin Straw Delayed swallow initiation-pyriform sinuses;Reduced epiglottic inversion;Reduced airway/laryngeal closure;Reduced tongue base retraction;Penetration/Aspiration during swallow;Penetration/Apiration after swallow;Pharyngeal residue - valleculae Pharyngeal Material enters airway, passes BELOW cords without attempt by patient to eject out (silent aspiration) Pharyngeal- Puree Delayed swallow initiation-vallecula;Reduced epiglottic inversion;Reduced tongue base retraction;Pharyngeal residue - valleculae;Pharyngeal residue - cp segment Pharyngeal -- Pharyngeal- Mechanical Soft Delayed swallow initiation-vallecula;Reduced epiglottic inversion;Reduced tongue base retraction;Pharyngeal residue - valleculae;Pharyngeal residue - cp segment Pharyngeal -- Pharyngeal- Regular -- Pharyngeal -- Pharyngeal- Multi-consistency -- Pharyngeal -- Pharyngeal- Pill -- Pharyngeal -- Pharyngeal Comment --  CHL IP CERVICAL ESOPHAGEAL PHASE 11/18/2016 Cervical Esophageal Phase Impaired Pudding Teaspoon -- Pudding Cup -- Honey Teaspoon -- Honey Cup -- Nectar Teaspoon -- Nectar Cup Esophageal backflow into cervical esophagus Nectar Straw -- Thin Teaspoon -- Thin Cup Esophageal backflow into cervical esophagus Thin Straw Esophageal backflow into cervical esophagus Puree -- Mechanical Soft -- Regular -- Multi-consistency -- Pill -- Cervical Esophageal Comment -- No flowsheet data found. Amy Cecille Aver, MA, CCC-SLP 11/18/2016, 2:22 PM [phone]             US Abdomen Limited Ruq  Result Date: 11/17/2016 CLINICAL DATA:  Elevated liver function tests. EXAM: ULTRASOUND ABDOMEN LIMITED RIGHT UPPER QUADRANT COMPARISON:  None. FINDINGS: Gallbladder: No gallstones or wall thickening visualized. No sonographic Murphy sign noted by sonographer. Common bile duct: Diameter: 1 mm Liver: No focal lesion identified. Within normal limits in parenchymal echogenicity. IMPRESSION: Within normal limits. Electronically Signed   By: Jolaine Click M.D.   On:  11/17/2016 20:15   Scheduled Meds: . chlorhexidine  15 mL Mouth Rinse BID  . diltiazem  120 mg Oral Daily  . [START ON 11/19/2016] fluconazole  100 mg Oral Daily  . folic acid  1 mg Oral Daily  . lactose free nutrition  237 mL Oral TID WC  . levothyroxine  88 mcg Oral QAC breakfast  . mouth rinse  15 mL Mouth Rinse q12n4p  . metoprolol tartrate  25 mg Oral BID  . mirtazapine  45 mg Oral QHS  . pantoprazole (PROTONIX) IV  40 mg Intravenous Q24H  . QUEtiapine  25 mg Oral QHS  . senna  1 tablet Oral BID  . sertraline  50 mg Oral Daily  . thiamine  100 mg Oral Daily   Continuous Infusions:   LOS: 2 days   Merlene Laughter, DO [facility] Pager [phone]  If 7PM-7AM, please contact night-coverage [email or web address] Password [email or web address] 11/18/2016, 5:50 PM

## 2016-11-18 NOTE — Plan of Care (Signed)
Problem: Nutrition: Goal: Adequate nutrition will be maintained Outcome: Not Progressing Pt NPO   

## 2016-11-18 NOTE — Consult Note (Addendum)
Attending physician's note   I have taken a history, examined the patient and reviewed the chart. I agree with the Advanced Practitioner's note, impression and recommendations. I reviewed barium esophagram, no evidence of esophageal stricture or mass lesion. Decreased esophageal motility. She has significant oropharyngeal dysphagia.  Continue empiric fluconazole for 7 days, for possible Candidiasis given complaint of odynophagia Diet as per recommendation of modified barium swallow. Given no definitive esophageal stricture or gross mucosal abnormality on barium esophagram, EGD will be low yield and has increased risk associated with sedation and procedure. We'll hold off EGD. We'll sign off, please call with any questions  K Scherry Ran, MD [phone] Mon-Fri 8a-5p [phone] after 5p, weekends, holidays  Referring Provider: [facility] Primary Care Physician:  Jasmine Munroe, NP Primary Gastroenterologist:   Jasmine Head, MD  Reason for Consultation: dysphagia  ASSESSMENT AND PLAN:    1. 83 yo female with complaints of solid food dysphagia. Symptoms intermittent. She has ~28 pounds over last several months but doesn't sound like it is secondary to swallowing problems. DDx: presbyesophagus, ? Candida esophagitis (scant white residue on tongue), esophageal stricture, neoplasm? She has a hx of Barrett's. -will start with a barium swallow with tablet today. Depending on results she may need EGD tomorrow.   -Recommends nutritional supplements. Prealbumin 8.6 -empirical fluconazole  2. Hx of Barrett's esophagus.  Last EGD with biopsies in 2010 revealed only reflux esophagitis, no Barrett's.  3. SOB with minimal exertion and after conversation. No chest pain. No acute CXR finding. Elevated Troponin, ? Afib with RVR.  EKG 2 days ago >> rapid AFIB with RVR  4. Thrombocytopenia, chronic and stable.   5. COPD, stable.   6. Chronic afib, RVR overnight. On Cardizem and prn IV  metoprolol  HPI: Jasmine [name] is a 83 y.o. female admitted on 7/30 with weakness. She has a h/o Barrett's esophagus. [name] describes difficulty swallowing. Solids go down slowly but occasionally solids feel lodged in esophagus and take much longer to go down. No vomiting or regurgitation. No significant discomfort with swallowing. She cannot really recall how long swallowing problems have been present. She reports significant weight loss over the last year. Based on Epic she has lost from 118 pounds to 90 pounds since March of this year. No abdominal pain. She admits to constipation but feels that it is secondary to decreased intake.    Past Medical History:  Diagnosis Date  . Anxiety   . Atrial fibrillation with RVR (HCC)   . Barrett's esophagus   . CAD (coronary artery disease) of artery bypass graft 1995  . Fracture of right shoulder 02/2008  . GERD (gastroesophageal reflux disease)   . Hemorrhoids, external   . Hiatal hernia   . History of colon polyps   . Hyperlipidemia   . Hypertension   . Hypothyroidism   . Insomnia due to medical condition   . Left wrist fracture 09/2007  . Low back pain   . Syncope     Past Surgical History:  Procedure Laterality Date  . CARDIAC CATHETERIZATION  02/25/1996   No intervention-recommend CABG. Left main-80% concentric distal stenosis, LAD-99% ostial stenosis, first diag-80% segmental proximal stenosis, Circumflex-80% ostial stenosis, RCA-90% ostial stenosis.  Marland Kitchen CARDIAC CATHETERIZATION  05/21/2003   No intervention-widely patent grafts.  . CAROTID DOPPLER Bilateral 05/15/2010   Right Bulb/CEA-demonstrated trace irregular nonhemodynamically significant plaque 0-49%. Left Bulb/Proximal ICA-demonstrated irregular nonhemodynamically signifcant plaque 0-49%.  . CAROTID ENDARTERECTOMY    . CATARACT EXTRACTION    .  COLONOSCOPY W/ POLYPECTOMY  06/2005  . CORONARY ARTERY BYPASS GRAFT  1997  . LEXISCAN MYOVIEW  04/03/2011   No scintigraphic evidence  of inducible myocardial ischemia. No lexiscan EKG changes. Non-diagnostic for ischemia.   . TRANSTHORACIC ECHOCARDIOGRAM  01/08/2011   EF 50-55%, LA moderately dilated, moderate tricuspid regurg, mild pulmonary hypertension.    Prior to Admission medications   Medication Sig Start Date End Date Taking? Authorizing Provider  acetaminophen (TYLENOL) 500 MG tablet Take 1 tablet (500 mg total) by mouth every 6 (six) hours as needed for mild pain. For pain 04/10/15  Yes Regalado, Belkys A, MD  aspirin 81 MG chewable tablet Chew 81 mg by mouth daily.   Yes [provider]  atorvastatin (LIPITOR) 80 MG tablet TAKE 1 TABLET DAILY Patient taking differently: Take 80 mg by mouth once a day 07/15/16  Yes Baity, Salvadore Oxfordegina W, NP  cyanocobalamin 500 MCG tablet Take 500 mcg by mouth daily.     Yes [provider]  diltiazem (TIAZAC) 120 MG 24 hr capsule Take 1 capsule (120 mg total) by mouth daily. 03/30/16  Yes Runell GessBerry, Jonathan J, MD  furosemide (LASIX) 20 MG tablet TAKE 1 TABLET DAILY (MUST SCHEDULE ANNUAL EXAM) Patient taking differently: Take 20 mg by mouth in the morning 09/16/16  Yes Baity, Salvadore Oxfordegina W, NP  KLOR-CON M20 20 MEQ tablet TAKE 1 TABLET DAILY Patient taking differently: Take 20 mEq once a day 09/16/16  Yes Baity, Salvadore Oxfordegina W, NP  lactose free nutrition (BOOST) LIQD Take 237 mLs by mouth 2 (two) times daily between meals.   Yes [provider]  levothyroxine (SYNTHROID, LEVOTHROID) 88 MCG tablet TAKE 1 TABLET DAILY Patient taking differently: Take 88 mcg by mouth in the morning 06/15/16  Yes Baity, Salvadore Oxfordegina W, NP  mirtazapine (REMERON) 45 MG tablet TAKE 1 TABLET AT BEDTIME 06/29/16  Yes Baity, Salvadore Oxfordegina W, NP  nitroGLYCERIN (NITROSTAT) 0.4 MG SL tablet Place 1 tablet (0.4 mg total) under the tongue every 5 (five) minutes x 3 doses as needed for chest pain. 08/12/16  Yes Berton BonHammond, Janine, NP  omeprazole (PRILOSEC) 20 MG capsule TAKE 1 CAPSULE DAILY Patient taking differently: Take 20 mg by  mouth in the morning before breakfast 12/10/15  Yes Baity, Salvadore Oxfordegina W, NP  QUEtiapine (SEROQUEL) 25 MG tablet TAKE 1 TABLET AT BEDTIME Patient taking differently: Take 25 mg by mouth at bedtime 07/08/16  Yes Baity, Salvadore Oxfordegina W, NP  sennosides-docusate sodium (SENOKOT-S) 8.6-50 MG tablet Take 1 tablet by mouth daily as needed for constipation. Hold for loose stool    Yes [provider]  sertraline (ZOLOFT) 25 MG tablet Take 2 tablets (50 mg total) by mouth daily. 10/08/16  Yes Jasmine MunroeBaity, Regina W, NP  vitamin E 400 UNIT capsule Take 400 Units by mouth daily.     Yes [provider]  bisoprolol (ZEBETA) 10 MG tablet TAKE 1 TABLET DAILY Patient not taking: Reported on 11/16/2016 09/16/16   Jasmine MunroeBaity, Regina W, NP  ipratropium-albuterol (DUONEB) 0.5-2.5 (3) MG/3ML SOLN Take 3 mLs by nebulization every 6 (six) hours as needed. Patient not taking: Reported on 11/16/2016 04/10/15   Alba Coryegalado, Belkys A, MD    Current Facility-Administered Medications  Medication Dose Route Frequency Provider Last Rate Last Dose  . acetaminophen (TYLENOL) tablet 650 mg  650 mg Oral Q6H PRN Doutova, Anastassia, MD       Or  . acetaminophen (TYLENOL) suppository 650 mg  650 mg Rectal Q6H PRN Therisa Doyneoutova, Anastassia, MD      .  atorvastatin (LIPITOR) tablet 80 mg  80 mg Oral Daily Doutova, Jonny Ruiz, MD   80 mg at 11/18/16 0902  . bisacodyl (DULCOLAX) suppository 10 mg  10 mg Rectal Daily PRN Therisa Doyne, MD      . diltiazem (CARDIZEM CD) 24 hr capsule 120 mg  120 mg Oral Daily Doutova, Anastassia, MD   120 mg at 11/18/16 0902  . folic acid (FOLVITE) tablet 1 mg  1 mg Oral Daily Doutova, Anastassia, MD   1 mg at 11/18/16 0903  . lactose free nutrition (Boost) liquid 237 mL  237 mL Oral TID WC Narda Bonds, MD      . levothyroxine (SYNTHROID, LEVOTHROID) tablet 88 mcg  88 mcg Oral QAC breakfast Therisa Doyne, MD   88 mcg at 11/18/16 0903  . metoprolol tartrate (LOPRESSOR) tablet 25 mg  25 mg Oral BID Narda Bonds, MD   25 mg at 11/18/16 0902  . mirtazapine (REMERON) tablet 45 mg  45 mg Oral QHS Therisa Doyne, MD   45 mg at 11/17/16 2140  . ondansetron (ZOFRAN) tablet 4 mg  4 mg Oral Q6H PRN Doutova, Anastassia, MD       Or  . ondansetron (ZOFRAN) injection 4 mg  4 mg Intravenous Q6H PRN Doutova, Anastassia, MD      . pantoprazole (PROTONIX) injection 40 mg  40 mg Intravenous Q24H Narda Bonds, MD   40 mg at 11/18/16 0902  . polyethylene glycol (MIRALAX / GLYCOLAX) packet 17 g  17 g Oral Daily PRN Doutova, Anastassia, MD      . QUEtiapine (SEROQUEL) tablet 25 mg  25 mg Oral QHS Therisa Doyne, MD   25 mg at 11/17/16 2139  . senna (SENOKOT) tablet 8.6 mg  1 tablet Oral BID Therisa Doyne, MD   8.6 mg at 11/18/16 0902  . sertraline (ZOLOFT) tablet 50 mg  50 mg Oral Daily Doutova, Anastassia, MD   50 mg at 11/18/16 0902  . thiamine (VITAMIN B-1) tablet 100 mg  100 mg Oral Daily Doutova, Anastassia, MD   100 mg at 11/18/16 0903    Allergies as of 11/16/2016 - Review Complete 11/16/2016  Allergen Reaction Noted  . Codeine  01/12/2007  . Meperidine hcl  01/12/2007  . Oxycodone-acetaminophen  01/12/2007    Family History  Problem Relation Age of Onset  . Heart disease Mother   . Heart disease Father   . Diabetes Son     Social History   Social History  . Marital status: Widowed    Spouse name: N/A  . Number of children: N/A  . Years of education: N/A   Occupational History  . Not on file.   Social History Main Topics  . Smoking status: Former Smoker    Quit date: 04/20/1968  . Smokeless tobacco: Never Used     Comment: QUIT IN 1970  . Alcohol use No     Comment: STOPPED IN 1970  . Drug use: No  . Sexual activity: Not Currently   Other Topics Concern  . Not on file   Social History Narrative  . No narrative on file    Review of Systems: All systems reviewed and negative except where noted in HPI.  Physical Exam: Vital signs in last 24 hours: Temp:   [98.3 F (36.8 C)-98.9 F (37.2 C)] 98.3 F (36.8 C) (08/01 0604) Pulse Rate:  [105-133] 133 (08/01 0902) Resp:  [16-20] 17 (08/01 0604) BP: (112-152)/(69-96) 152/96 (08/01 0902) SpO2:  [94 %-96 %]  94 % (08/01 0604) Last BM Date: 11/17/16 General:   Alert,thin white female in NAD Psych:  Pleasant, cooperative. Normal mood and affect. Eyes:  Pupils equal, sclera clear, no icterus.   Conjunctiva pink. Ears:  Normal auditory acuity. Nose:  No deformity, discharge,  or lesions. Oral : scant amount of white film on tongue Neck:  Supple; no masses Lungs:  Clear throughout to auscultation.   No wheezes, crackles, or rhonchi.  Heart:  Irregular rate and rhythm, no edema Abdomen:  Soft, non-distended, nontender, BS active, no palp mass    Rectal:  Deferred  Msk:  Symmetrical without gross deformities. . Pulses:  Normal pulses noted. Neurologic:  Alert and  oriented x4;  grossly normal neurologically. Skin:  Intact without significant lesions or rashes..   Intake/Output from previous day: 07/31 0701 - 08/01 0700 In: 240 [P.O.:240] Out: -  Intake/Output this shift: Total I/O In: -  Out: 200 [Urine:200]  Lab Results:  Recent Labs  11/16/16 1350 11/17/16 0336  WBC 8.0 7.1  HGB 13.7 13.6  HCT 42.8 41.3  PLT 148* 134*   BMET  Recent Labs  11/16/16 1350 11/17/16 0336  NA 139 141  K 4.7 3.7  CL 100* 103  CO2 28 27  GLUCOSE 121* 85  BUN 20 15  CREATININE 1.13* 0.92  CALCIUM 8.8* 8.1*   LFT  Recent Labs  11/17/16 0336  PROT 5.8*  ALBUMIN 2.6*  AST 184*  ALT 102*  ALKPHOS 82  BILITOT 1.0   PT/INR No results for input(s): LABPROT, INR in the last 72 hours. Hepatitis Panel  Recent Labs  11/17/16 1027  HEPBSAG Negative  HCVAB <0.1  HEPAIGM Negative  HEPBIGM Negative      Studies/Results: Dg Chest 2 View  Result Date: 11/16/2016 CLINICAL DATA:  Generalized weakness for 2 weeks. EXAM: CHEST  2 VIEW COMPARISON:  08/11/2016 FINDINGS: Chronic coarse lung  markings. Few patchy densities at the lung bases may be chronic. Heart and mediastinum are within normal limits. Thoracic aorta is heavily calcified. Prior median sternotomy and evidence of prior CABG procedure. No large areas of airspace disease or consolidation. No large pleural effusions. IMPRESSION: Chronic lung changes without acute findings. Aortic atherosclerosis. Electronically Signed   By: Richarda Overlie M.D.   On: 11/16/2016 16:33   Ct [name] Wo Contrast  Result Date: 11/16/2016 CLINICAL DATA:  Generalized weakness.  Unable to stand. EXAM: CT [name] WITHOUT CONTRAST TECHNIQUE: Contiguous axial images were obtained from the base of the skull through the vertex without intravenous contrast. COMPARISON:  CT [name] without contrast 07/16/2016 FINDINGS: Brain: Moderate atrophy and white matter disease is stable. No acute infarct, hemorrhage, or mass lesion is present. Remote is infarcts of the thalami are again noted. The basal ganglia are otherwise intact. Insular ribbon is normal bilaterally. The ventricles are proportionate to the degree of atrophy. No significant extra-axial fluid collection is present. The brainstem and cerebellum are otherwise within normal limits. Vascular: Atherosclerotic calcifications are present within the cavernous internal carotid artery's bilaterally. Calcifications are noted at the dural margin of the vertebral arteries. There is no hyperdense vessel. Skull: The calvarium is intact. No focal lytic or blastic lesions are present. Sinuses/Orbits: The paranasal sinuses and mastoid air cells are clear. Bilateral lens replacements are present. The globes and orbits are within normal limits bilaterally. IMPRESSION: 1. Stable moderate atrophy and white matter disease. This likely reflects the sequela of chronic microvascular ischemia. 2. Remote lacunar infarcts involving the thalami bilaterally. 3. No  acute intracranial abnormality. 4. Atherosclerosis. Electronically Signed   By: Marin Robertshristopher   Mattern M.D.   On: 11/16/2016 16:41   [place] Abdomen Limited Ruq  Result Date: 11/17/2016 CLINICAL DATA:  Elevated liver function tests. EXAM: ULTRASOUND ABDOMEN LIMITED RIGHT UPPER QUADRANT COMPARISON:  None. FINDINGS: Gallbladder: No gallstones or wall thickening visualized. No sonographic Murphy sign noted by sonographer. Common bile duct: Diameter: 1 mm Liver: No focal lesion identified. Within normal limits in parenchymal echogenicity. IMPRESSION: Within normal limits. Electronically Signed   By: Jolaine ClickArthur  Hoss M.D.   On: 11/17/2016 20:15    Willette ClusterPaula Guenther, NP-C @  11/18/2016, 9:39 AM  Pager number [phone]

## 2016-11-18 NOTE — Progress Notes (Signed)
Modified Barium Swallow Progress Note  Patient Details  Name: Jasmine [name] MRN: [id number] Date of Birth: 1923-02-24  Today's Date: 11/18/2016  Modified Barium Swallow completed.  Full report located under Chart Review in the Imaging Section.  Brief recommendations include the following:  Clinical Impression  Pt with a moderate sensorimotor pharyngeal dysphagia characterized by a delay in swallow initiation to the level of the pyriform sinuses with reduced base of tongue retraction/ reduced epiglottic inversion resulting in silent aspiration of thin liquids, penetration x1 of nectar-thick liquids. Cued pt to cough but cough was weak and pt was unable to clear all of aspirated material. Pt also with moderate residuals in the vallecula and pt noted to have penetration of residuals post-swallow on 2 occasions. Chin tuck was effective on 4 out of 5 trials with nectar-thick liquids by cup in preventing penetration/ aspiration and clearing some vallecula residuals. Recommend initiating dysphagia 1/ puree diet with nectar-thick liquids by cup only, utilizing chin tuck strategy, multiple swallows, intermittent cough to clear possible penetrated/ aspirated material. Pt remains at risk of aspirating these consistencies or aspirating post-swallow residuals. Will continue to follow for diet tolerance/ review of compensatory strategies.    Swallow Evaluation Recommendations       SLP Diet Recommendations: Dysphagia 1 (Puree) solids;Nectar thick liquid   Liquid Administration via: Cup;No straw   Medication Administration: Crushed with puree   Supervision: Patient able to self feed;Full supervision/cueing for compensatory strategies   Compensations: Slow rate;Small sips/bites;Multiple dry swallows after each bite/sip;Clear throat intermittently;Chin tuck   Postural Changes: Remain semi-upright after after feeds/meals (Comment);Seated upright at 90 degrees   Oral Care Recommendations: Oral care BID   Other Recommendations: Order thickener from pharmacy;Clarify dietary restrictions    Amy Cecille AverK Oleksiak, MA, CCC-SLP 11/18/2016,2:27 PM  [phone]

## 2016-11-18 NOTE — NC FL2 (Signed)
[place] MEDICAID FL2 LEVEL OF CARE SCREENING TOOL     IDENTIFICATION  Patient Name: Jasmine [name] Birthdate: September 21, 1922 Sex: female Admission Date (Current Location): 11/16/2016  [facility] [place]   Facility and Address:  [facility], [address], [place], [place] [address]      Provider Number: [id number]  Attending Physician Name and Address:  Merlene LaughterSheikh, Omair Latif, DO  Relative Name and Phone Number:  [name], daughter, [phone]    Current Level of Care: Hospital Recommended Level of Care: Skilled Nursing Facility Prior Approval Number:    Date Approved/Denied:   PASRR Number: [id number] [id number]  Discharge Plan: SNF    Current Diagnoses: Patient Active Problem List   Diagnosis Date Noted  . Protein-calorie malnutrition, severe 11/18/2016  . Dysphagia 11/16/2016  . Odynophagia 11/16/2016  . Dehydration 11/16/2016  . Pain with swallowing 11/16/2016  . Chronic respiratory failure with hypoxia (HCC) 08/13/2016  . Depression with anxiety 08/13/2016  . DVT (deep venous thrombosis) (HCC) 07/15/2016  . COPD with acute exacerbation (HCC) 07/15/2016  . Constipation 11/20/2015  . Thoracic back pain 11/20/2015  . Thrombocytopenia (HCC) 01/23/2015  . Chronic diastolic CHF (congestive heart failure) (HCC) 01/22/2015  . Carotid artery disease (HCC) 03/22/2014  . CAD - CABG '97, cath OK 2005, Myoview low risk 12/12 01/06/2013  . HTN (hypertension) 01/06/2013  . Chronic atrial fibrillation (HCC) 01/06/2013  . Barrett's esophagus 06/13/2008  . INSOMNIA, CHRONIC 01/17/2007  . Hypothyroidism 01/12/2007  . HLD (hyperlipidemia) 01/12/2007  . Anxiety state 01/12/2007    Orientation RESPIRATION BLADDER Height & Weight     Self  Normal Continent Weight: 40.8 kg (90 lb) Height:  5\' 3"  (160 cm)  BEHAVIORAL SYMPTOMS/MOOD NEUROLOGICAL BOWEL NUTRITION STATUS      Continent Diet (Please see DC Summary)  AMBULATORY STATUS COMMUNICATION OF  NEEDS Skin   Limited Assist Verbally Normal                       Personal Care Assistance Level of Assistance  Bathing, Feeding, Dressing Bathing Assistance: Limited assistance Feeding assistance: Independent Dressing Assistance: Limited assistance     Functional Limitations Info             SPECIAL CARE FACTORS FREQUENCY  PT (By licensed PT), OT (By licensed OT)     PT Frequency: 5x/week OT Frequency: 3x/week            Contractures      Additional Factors Info  Code Status, Allergies, Psychotropic Code Status Info: DNR Allergies Info: Codeine, Meperidine Hcl, Oxycodone-acetaminophen Psychotropic Info: Seroquel; Zoloft         Current Medications (11/18/2016):  This is the current hospital active medication list Current Facility-Administered Medications  Medication Dose Route Frequency Provider Last Rate Last Dose  . acetaminophen (TYLENOL) tablet 650 mg  650 mg Oral Q6H PRN Therisa Doyneoutova, Anastassia, MD       Or  . acetaminophen (TYLENOL) suppository 650 mg  650 mg Rectal Q6H PRN Doutova, Anastassia, MD      . atorvastatin (LIPITOR) tablet 80 mg  80 mg Oral Daily Doutova, Anastassia, MD   80 mg at 11/18/16 0902  . bisacodyl (DULCOLAX) suppository 10 mg  10 mg Rectal Daily PRN Therisa Doyneoutova, Anastassia, MD      . diltiazem (CARDIZEM CD) 24 hr capsule 120 mg  120 mg Oral Daily Doutova, Anastassia, MD   120 mg at 11/18/16 0902  . folic acid (FOLVITE) tablet  1 mg  1 mg Oral Daily Doutova, Anastassia, MD   1 mg at 11/18/16 0903  . lactose free nutrition (Boost) liquid 237 mL  237 mL Oral TID WC Narda BondsNettey, Ralph A, MD      . levothyroxine (SYNTHROID, LEVOTHROID) tablet 88 mcg  88 mcg Oral QAC breakfast Therisa Doyneoutova, Anastassia, MD   88 mcg at 11/18/16 0903  . metoprolol tartrate (LOPRESSOR) tablet 25 mg  25 mg Oral BID Narda BondsNettey, Ralph A, MD   25 mg at 11/18/16 0902  . mirtazapine (REMERON) tablet 45 mg  45 mg Oral QHS Therisa Doyneoutova, Anastassia, MD   45 mg at 11/17/16 2140  . ondansetron  (ZOFRAN) tablet 4 mg  4 mg Oral Q6H PRN Doutova, Anastassia, MD       Or  . ondansetron (ZOFRAN) injection 4 mg  4 mg Intravenous Q6H PRN Doutova, Anastassia, MD      . pantoprazole (PROTONIX) injection 40 mg  40 mg Intravenous Q24H Narda BondsNettey, Ralph A, MD   40 mg at 11/18/16 0902  . polyethylene glycol (MIRALAX / GLYCOLAX) packet 17 g  17 g Oral Daily PRN Doutova, Anastassia, MD      . QUEtiapine (SEROQUEL) tablet 25 mg  25 mg Oral QHS Therisa Doyneoutova, Anastassia, MD   25 mg at 11/17/16 2139  . senna (SENOKOT) tablet 8.6 mg  1 tablet Oral BID Therisa Doyneoutova, Anastassia, MD   8.6 mg at 11/18/16 0902  . sertraline (ZOLOFT) tablet 50 mg  50 mg Oral Daily Doutova, Anastassia, MD   50 mg at 11/18/16 0902  . thiamine (VITAMIN B-1) tablet 100 mg  100 mg Oral Daily Therisa Doyneoutova, Anastassia, MD   100 mg at 11/18/16 16100903     Discharge Medications: Please see discharge summary for a list of discharge medications.  Relevant Imaging Results:  Relevant Lab Results:   Additional Information SS#: [id number]  Mearl Latinadia S Naydene Kamrowski, LCSWA

## 2016-11-19 ENCOUNTER — Encounter (HOSPITAL_COMMUNITY)
Admission: EM | Disposition: A | Discharge status: Skilled Nursing Facility | Payer: Self-pay | Source: Home / Self Care | Attending: Internal Medicine

## 2016-11-19 ENCOUNTER — Ambulatory Visit: Payer: Medicare Other | Admitting: Internal Medicine

## 2016-11-19 ENCOUNTER — Inpatient Hospital Stay (HOSPITAL_COMMUNITY): Payer: Medicare Other

## 2016-11-19 DIAGNOSIS — I360 Nonrheumatic tricuspid (valve) stenosis: Secondary | ICD-10-CM

## 2016-11-19 DIAGNOSIS — L89151 Pressure ulcer of sacral region, stage 1: Secondary | ICD-10-CM | POA: Diagnosis not present

## 2016-11-19 DIAGNOSIS — I509 Heart failure, unspecified: Secondary | ICD-10-CM | POA: Diagnosis not present

## 2016-11-19 DIAGNOSIS — E876 Hypokalemia: Secondary | ICD-10-CM

## 2016-11-19 DIAGNOSIS — I4891 Unspecified atrial fibrillation: Secondary | ICD-10-CM | POA: Diagnosis not present

## 2016-11-19 DIAGNOSIS — I482 Chronic atrial fibrillation: Secondary | ICD-10-CM | POA: Diagnosis not present

## 2016-11-19 DIAGNOSIS — R531 Weakness: Secondary | ICD-10-CM | POA: Diagnosis not present

## 2016-11-19 DIAGNOSIS — E039 Hypothyroidism, unspecified: Secondary | ICD-10-CM | POA: Diagnosis not present

## 2016-11-19 DIAGNOSIS — Z5189 Encounter for other specified aftercare: Secondary | ICD-10-CM | POA: Diagnosis not present

## 2016-11-19 DIAGNOSIS — R4182 Altered mental status, unspecified: Secondary | ICD-10-CM | POA: Diagnosis not present

## 2016-11-19 DIAGNOSIS — Z79899 Other long term (current) drug therapy: Secondary | ICD-10-CM | POA: Diagnosis not present

## 2016-11-19 DIAGNOSIS — I10 Essential (primary) hypertension: Secondary | ICD-10-CM | POA: Diagnosis not present

## 2016-11-19 DIAGNOSIS — J9610 Chronic respiratory failure, unspecified whether with hypoxia or hypercapnia: Secondary | ICD-10-CM | POA: Diagnosis not present

## 2016-11-19 DIAGNOSIS — I5032 Chronic diastolic (congestive) heart failure: Secondary | ICD-10-CM | POA: Diagnosis not present

## 2016-11-19 DIAGNOSIS — N179 Acute kidney failure, unspecified: Secondary | ICD-10-CM

## 2016-11-19 DIAGNOSIS — R1310 Dysphagia, unspecified: Secondary | ICD-10-CM | POA: Diagnosis not present

## 2016-11-19 DIAGNOSIS — K22719 Barrett's esophagus with dysplasia, unspecified: Secondary | ICD-10-CM

## 2016-11-19 DIAGNOSIS — R509 Fever, unspecified: Secondary | ICD-10-CM | POA: Diagnosis not present

## 2016-11-19 DIAGNOSIS — B999 Unspecified infectious disease: Secondary | ICD-10-CM | POA: Diagnosis not present

## 2016-11-19 DIAGNOSIS — R945 Abnormal results of liver function studies: Secondary | ICD-10-CM | POA: Diagnosis not present

## 2016-11-19 DIAGNOSIS — R2689 Other abnormalities of gait and mobility: Secondary | ICD-10-CM | POA: Diagnosis not present

## 2016-11-19 DIAGNOSIS — R740 Nonspecific elevation of levels of transaminase and lactic acid dehydrogenase [LDH]: Secondary | ICD-10-CM | POA: Diagnosis not present

## 2016-11-19 DIAGNOSIS — E7800 Pure hypercholesterolemia, unspecified: Secondary | ICD-10-CM | POA: Diagnosis not present

## 2016-11-19 DIAGNOSIS — J449 Chronic obstructive pulmonary disease, unspecified: Secondary | ICD-10-CM | POA: Diagnosis not present

## 2016-11-19 DIAGNOSIS — R1314 Dysphagia, pharyngoesophageal phase: Secondary | ICD-10-CM | POA: Diagnosis not present

## 2016-11-19 DIAGNOSIS — E860 Dehydration: Secondary | ICD-10-CM | POA: Diagnosis not present

## 2016-11-19 DIAGNOSIS — I2510 Atherosclerotic heart disease of native coronary artery without angina pectoris: Secondary | ICD-10-CM | POA: Diagnosis not present

## 2016-11-19 DIAGNOSIS — R278 Other lack of coordination: Secondary | ICD-10-CM | POA: Diagnosis not present

## 2016-11-19 DIAGNOSIS — M6281 Muscle weakness (generalized): Secondary | ICD-10-CM | POA: Diagnosis not present

## 2016-11-19 LAB — CBC WITH DIFFERENTIAL/PLATELET
BASOS ABS: 0 10*3/uL (ref 0.0–0.1)
BASOS PCT: 0 %
Eosinophils Absolute: 0.1 10*3/uL (ref 0.0–0.7)
Eosinophils Relative: 1 %
HCT: 44.2 % (ref 36.0–46.0)
HEMOGLOBIN: 14.7 g/dL (ref 12.0–15.0)
LYMPHS PCT: 10 %
Lymphs Abs: 0.9 10*3/uL (ref 0.7–4.0)
MCH: 30.8 pg (ref 26.0–34.0)
MCHC: 33.3 g/dL (ref 30.0–36.0)
MCV: 92.7 fL (ref 78.0–100.0)
MONO ABS: 0.6 10*3/uL (ref 0.1–1.0)
Monocytes Relative: 7 %
NEUTROS ABS: 7.3 10*3/uL (ref 1.7–7.7)
NEUTROS PCT: 82 %
PLATELETS: 168 10*3/uL (ref 150–400)
RBC: 4.77 MIL/uL (ref 3.87–5.11)
RDW: 13.9 % (ref 11.5–15.5)
WBC: 8.9 10*3/uL (ref 4.0–10.5)

## 2016-11-19 LAB — COMPREHENSIVE METABOLIC PANEL
ALT: 133 U/L — AB (ref 14–54)
AST: 250 U/L — AB (ref 15–41)
Albumin: 2.6 g/dL — ABNORMAL LOW (ref 3.5–5.0)
Alkaline Phosphatase: 85 U/L (ref 38–126)
Anion gap: 8 (ref 5–15)
BUN: 21 mg/dL — AB (ref 6–20)
CHLORIDE: 104 mmol/L (ref 101–111)
CO2: 27 mmol/L (ref 22–32)
Calcium: 8.3 mg/dL — ABNORMAL LOW (ref 8.9–10.3)
Creatinine, Ser: 1.1 mg/dL — ABNORMAL HIGH (ref 0.44–1.00)
GFR calc Af Amer: 49 mL/min — ABNORMAL LOW (ref >60)
GFR, EST NON AFRICAN AMERICAN: 42 mL/min — AB (ref >60)
Glucose, Bld: 85 mg/dL (ref 65–99)
Potassium: 3.3 mmol/L — ABNORMAL LOW (ref 3.5–5.1)
SODIUM: 139 mmol/L (ref 135–145)
Total Bilirubin: 0.9 mg/dL (ref 0.3–1.2)
Total Protein: 5.5 g/dL — ABNORMAL LOW (ref 6.5–8.1)

## 2016-11-19 LAB — ECHOCARDIOGRAM COMPLETE
HEIGHTINCHES: 63 in
Weight: 1440 oz

## 2016-11-19 LAB — PHOSPHORUS: PHOSPHORUS: 3.2 mg/dL (ref 2.5–4.6)

## 2016-11-19 LAB — MAGNESIUM: MAGNESIUM: 2 mg/dL (ref 1.7–2.4)

## 2016-11-19 SURGERY — ESOPHAGOGASTRODUODENOSCOPY (EGD) WITH PROPOFOL
Anesthesia: Monitor Anesthesia Care

## 2016-11-19 MED ORDER — ATORVASTATIN CALCIUM 80 MG PO TABS: ORAL_TABLET | ORAL | 1 refills | Status: DC

## 2016-11-19 MED ORDER — PANTOPRAZOLE SODIUM 40 MG PO TBEC: 40.0000 mg | DELAYED_RELEASE_TABLET | Freq: Every day | ORAL | Status: DC

## 2016-11-19 MED ORDER — POLYETHYLENE GLYCOL 3350 17 G PO PACK: 17.0000 g | PACK | Freq: Every day | ORAL | 0 refills | Status: AC | PRN

## 2016-11-19 MED ORDER — ONDANSETRON HCL 4 MG PO TABS: 4.0000 mg | ORAL_TABLET | Freq: Four times a day (QID) | ORAL | 0 refills | Status: AC | PRN

## 2016-11-19 MED ORDER — FLUCONAZOLE 100 MG PO TABS: 100.0000 mg | ORAL_TABLET | Freq: Every day | ORAL | 0 refills | Status: AC

## 2016-11-19 MED ORDER — CHLORHEXIDINE GLUCONATE 0.12 % MT SOLN
15.0000 mL | Freq: Two times a day (BID) | OROMUCOSAL | 0 refills | Status: AC
Start: 2016-11-19 — End: ?

## 2016-11-19 MED ORDER — PANTOPRAZOLE SODIUM 40 MG PO PACK
40.0000 mg | PACK | Freq: Every day | ORAL | Status: DC
  Administered 2016-11-19: 40 mg via ORAL
  Filled 2016-11-19: qty 20

## 2016-11-19 MED ORDER — POTASSIUM CHLORIDE IN NACL 40-0.9 MEQ/L-% IV SOLN
INTRAVENOUS | Status: DC
  Administered 2016-11-19: 50 mL/h via INTRAVENOUS
  Filled 2016-11-19: qty 1000

## 2016-11-19 MED ORDER — RESOURCE THICKENUP CLEAR PO POWD: 1.0000 | ORAL | 0 refills | Status: AC | PRN

## 2016-11-19 MED ORDER — FOLIC ACID 1 MG PO TABS: 1.0000 mg | ORAL_TABLET | Freq: Every day | ORAL | 0 refills | Status: DC

## 2016-11-19 MED ORDER — METOPROLOL TARTRATE 25 MG PO TABS: 25.0000 mg | ORAL_TABLET | Freq: Two times a day (BID) | ORAL | 0 refills | Status: DC

## 2016-11-19 MED ORDER — THIAMINE HCL 100 MG PO TABS: 100.0000 mg | ORAL_TABLET | Freq: Every day | ORAL | 0 refills | Status: AC

## 2016-11-19 MED ORDER — ORAL CARE MOUTH RINSE: 15.0000 mL | Freq: Two times a day (BID) | OROMUCOSAL | 0 refills | Status: AC

## 2016-11-19 MED ORDER — POTASSIUM CHLORIDE 20 MEQ/15ML (10%) PO SOLN
40.0000 meq | Freq: Once | ORAL | Status: AC
  Administered 2016-11-19: 40 meq via ORAL
  Filled 2016-11-19: qty 30

## 2016-11-19 NOTE — Clinical Social Work Placement (Signed)
   CLINICAL SOCIAL WORK PLACEMENT  NOTE  Date:  11/19/2016  Patient Details  Name: Jasmine [name] MRN: [id number] Date of Birth: 24-Aug-1922  Clinical Social Work is seeking post-discharge placement for this patient at the Skilled  Nursing Facility level of care (*CSW will initial, date and re-position this form in  chart as items are completed):  Yes   Patient/family provided with [place] Clinical Social Work Department's list of facilities offering this level of care within the geographic area requested by the patient (or if unable, by the patient's family).  Yes   Patient/family informed of their freedom to choose among providers that offer the needed level of care, that participate in Medicare, Medicaid or managed care program needed by the patient, have an available bed and are willing to accept the patient.  Yes   Patient/family informed of [facility]'s ownership interest in [facility] and [facility], as well as of the fact that they are under no obligation to receive care at these facilities.  PASRR submitted to EDS on       PASRR number received on       Existing PASRR number confirmed on 11/18/16     FL2 transmitted to all facilities in geographic area requested by pt/family on 11/18/16     FL2 transmitted to all facilities within larger geographic area on       Patient informed that his/her managed care company has contracts with or will negotiate with certain facilities, including the following:        Yes   Patient/family informed of bed offers received.  [facility]     Physician recommends and patient chooses bed at      Patient to be [facility] on 11/19/16.  Patient to be transferred to facility by [facility]     Patient family notified on 11/19/16 of transfer.  Name of family member notified:  [name]     PHYSICIAN Please sign DNR, Please prepare prescriptions     Additional Comment:     _______________________________________________ Maree KrabbeBridget A Owens Hara, LCSW 11/19/2016, 3:06 PM

## 2016-11-19 NOTE — Progress Notes (Signed)
  Speech Language Pathology Treatment: Dysphagia  Patient Details Name: Griffin Dakinmma A Cocuzza MRN: [id number] DOB: 1922-10-14 Today's Date: 11/19/2016 Time: [id number] SLP Time Calculation (min) (ACUTE ONLY): 20 min  Assessment / Plan / Recommendation Clinical Impression  Pt seen for diet tolerance check after MBS previous date. Pt consumed dysphagia 1/ nectar thick liquids by cup with intermittent verbal cues provided as reminders for strategies (chin tuck, multiple swallows, intermittent throat clear). Pt cleared throat x1 (not used as strategy) following sip of nectar-thick liquid and stated "I think I took too much in that time". Pt reported feeling comfortable with this diet recommendation so far. Recommend continuing current diet and full supervision to cue for compensatory strategies as pt is not yet independent in using them. Will continue to follow for diet tolerance/ consider advancement.  HPI HPI: Pt is a 83 y.o. female with PMH of COPD, Anxiety Depression, diastolic CHF, Afib, HTN, hypothyroidism, Barretts esophagus, CAD. Presented with 2 week history of worsening lightheadedness gyrus weakness secondary to decreased by mouth intake. Patient had been having prolonged history of pain with swallowing she decreased her by mouth intake didn't provider manage her pain. Progressively became weaker and now only able to stand with assistance as she does not have baseline daughter called primary care provider who recommended patient to be taken to emergency department EMS was called. Mild confusion at baseline. Reports food getting stuck in throat and has to gag to get it back up. Reports trouble swallowing has been gradual the past few months starting with alrge pieces of food; currently can only swallow fluids but still takes a long time to get them down. CXR negative for acute findings. EGD 2010 revealed barrett's esophagus in distal esophagus 33-36 cm in length, hiatal hernia. Bedside swallow eval ordered.       SLP Plan  Continue with current plan of care       Recommendations  Diet recommendations: Dysphagia 1 (puree);Nectar-thick liquid Liquids provided via: Cup;No straw Medication Administration: Crushed with puree Supervision: Patient able to self feed;Full supervision/cueing for compensatory strategies Compensations: Slow rate;Small sips/bites;Multiple dry swallows after each bite/sip;Clear throat intermittently;Chin tuck Postural Changes and/or Swallow Maneuvers: Seated upright 90 degrees;Upright 30-60 min after meal                Oral Care Recommendations: Oral care BID Follow up Recommendations: Skilled Nursing facility SLP Visit Diagnosis: Dysphagia, pharyngoesophageal phase (R13.14) Plan: Continue with current plan of care       GO                Metro KungAmy K Jamonta Goerner, MA, CCC-SLP 11/19/2016, 9:07 AM [phone]

## 2016-11-19 NOTE — Progress Notes (Signed)
Physical Therapy Treatment Patient Details Name: Jasmine [name] MRN: [id number] DOB: 01/28/1923 Today's Date: 11/19/2016    History of Present Illness Pt admitted with weakness and difficulty/painful swallowing with poor intake. PMH: COPD, anxiety, depression, CHF, afib, CAD with CABG, hypothyroidism, barretts esophagus, mild memory deficit.    PT Comments    Patient making very slow progress.  Requires encouragement to participate.   Follow Up Recommendations  SNF;Supervision/Assistance - 24 hour     Equipment Recommendations  None recommended by PT    Recommendations for Other Services       Precautions / Restrictions Precautions Precautions: Fall Restrictions Weight Bearing Restrictions: No    Mobility  Bed Mobility Overal bed mobility: Needs Assistance             General bed mobility comments: Patient declined to sit EOB.  Required max assist to reposition in bed.  Transfers                    Ambulation/Gait                 Stairs            Wheelchair Mobility    Modified Rankin (Stroke Patients Only)       Balance                                            Cognition Arousal/Alertness: Awake/alert Behavior During Therapy: WFL for tasks assessed/performed Overall Cognitive Status: History of cognitive impairments - at baseline                                 General Comments: hx of mild memory deficits      Exercises General Exercises - Lower Extremity Ankle Circles/Pumps: AROM;Both;10 reps;Supine Quad Sets: AROM;Both;5 reps;Supine Gluteal Sets: AROM;Both;5 reps;Supine Heel Slides: AROM;Both;5 reps;Supine Hip ABduction/ADduction: AROM;Both;5 reps;Supine    General Comments        Pertinent Vitals/Pain Pain Assessment: No/denies pain    Home Living                      Prior Function            PT Goals (current goals can now be found in the care plan section)  Progress towards PT goals: Progressing toward goals    Frequency    Min 3X/week      PT Plan Current plan remains appropriate    Co-evaluation              AM-PAC PT "6 Clicks" Daily Activity  Outcome Measure  Difficulty turning over in bed (including adjusting bedclothes, sheets and blankets)?: None Difficulty moving from lying on back to sitting on the side of the bed? : Total Difficulty sitting down on and standing up from a chair with arms (e.g., wheelchair, bedside commode, etc,.)?: Total Help needed moving to and from a bed to chair (including a wheelchair)?: A Lot Help needed walking in hospital room?: A Lot Help needed climbing 3-5 steps with a railing? : A Lot 6 Click Score: 12    End of Session   Activity Tolerance: Patient limited by fatigue Patient left: in bed;with call bell/phone within reach Nurse Communication: Other (comment) (IV beeping) PT Visit Diagnosis: Unsteadiness on feet (R26.81);Difficulty in walking, not elsewhere  classified (R26.2);Muscle weakness (generalized) (M62.81);Adult, failure to thrive (R62.7)     Time: [id number] PT Time Calculation (min) (ACUTE ONLY): 26 min  Charges:  $Therapeutic Exercise: 23-37 mins                    G Codes:       Durenda HurtSusan H. Renaldo Fiddleravis, PT, [facility] Acute Rehab Services Pager [phone]    Vena AustriaSusan H Robert Sunga 11/19/2016, 1:18 PM

## 2016-11-19 NOTE — Progress Notes (Signed)
SLP Cancellation Note  Patient Details Name: Griffin Dakinmma A Canale MRN: [id number] DOB: 1923/03/17   Cancelled treatment:       Reason Eval/Treat Not Completed: Medical issues which prohibited therapy. Pt is NPO for EGD. Will f/u next date.  Metro KungAmy K [place] Kuang, MA, CCC-SLP 11/19/2016, 8:30 AM [phone]

## 2016-11-19 NOTE — Discharge Summary (Addendum)
Physician Discharge Summary  Jasmine [name] ZOX:[id number] DOB: 1922/11/03 DOA: 11/16/2016  PCP: Lorre Munroe, NP  Admit date: 11/16/2016 Discharge date: 11/19/2016  Admitted From: Home Disposition:  SNF  Recommendations for Outpatient Follow-up:  1. Follow up with PCP within 1-2 weeks 2. Please obtain CMP/CBC, Mag, Phos in AM and within 1 week 3. Follow up with Cardiology as an outpatient.  4. Hold Statin while patient is on Fluconazole and repeat CMP to evaluate LFT's prior to Restarting   Home Health: No Equipment/Devices: None recommended by PT    Discharge Condition: Stable CODE STATUS: DO NOT RESUSCITATE Diet recommendation: Dysphagia 1 Diet with Nectar Thick Liquids  Brief/Interim Summary: Jasmine Harju Farlowis a 83 y.o.female with a history of COPD, anxiety, depression, diastolic heart failure, atrial fibrillation, hypertension, hypothyroidism, Barrett's esophagus, CAD. She presented with lightheadedness and generalized weakness. Was found to have Dysphagia/Odynaphagia and Gastroenterology was consulted and recommending MBS. She was placed on a Dysphagia 1 diet with Nectar Thick Liquids after SLP evaluated. No EGD to be done per Gastro and recommending treating for oral candidasis. Patient improved however she developed a mild AKI and Hypokalemia so she was repleted with K+ supplementation and given gentle IVF Rehydration. PT/OT evaluated and recommended SNF. Patient is stable to D/C to SNF and will need to continue to hold her Statin while she is taking Fluconazole and has elevated LFTs.   Discharge Diagnoses:  Active Problems:   Hypothyroidism   HLD (hyperlipidemia)   Barrett's esophagus   CAD - CABG '97, cath OK 2005, Myoview low risk 12/12   HTN (hypertension)   Chronic atrial fibrillation (HCC)   Carotid artery disease (HCC)   Chronic diastolic CHF (congestive heart failure) (HCC)   Thrombocytopenia (HCC)   Chronic respiratory failure with hypoxia (HCC)   Dysphagia    Odynophagia   Dehydration   Pain with swallowing   Protein-calorie malnutrition, severe   Abnormal LFTs  Odynophagia/Dysphagia -GI consulted. Unknown etiology. Patient stating a feeling of food being stuck. No regurgitation per patient. -GI recommendations: Modified Barium Swallow and Empirically treat for Oral Candidiasis  -Fluconazole Started by GI and given 200 mg po today and 100 mg po for 7 days.  -SLP evaluated and recommending Dysphagia 1 Diet with Nectar Thick Liquids after MBS -Gastroenterology signed off as they reviewed Barium Esophagram and showed no evidence of esophageal stricture or mass lesion. Decreased esophageal motility. She has significant oropharyngeal dysphagia. Given no definitive esophageal stricture or gross mucosal abnormality on barium esophagram, EGD will be low yield and has increased risk associated with sedation and procedure and GI held off on EGD.  Chronic Atrial Fibrillation -Was onTelemetry  -Continue Cardizem 120 mg po Daily and Metoprolol 25 mg po BID -Not on Anticoagulation due to Fall Risk  Chronic Diastolic Heart Failure -Initially dry. Not in Volume Overload but gave gentle IVF Rehydration today because of AKI -Continue to Monitor Strict I's and O's and Daily Weights at SNF -C/w Metoprolol 25 mg po BID -Follow up with Cardiology as an outpatient   Essential Hypertension -Controlled. -Continue Diltiazem 120 mg po Daily and Metoprolol 25 mg po Daily  History of Chronic Respiratory Failure -Resolved. Patient on room air. -Continue with Pulse Oximetry and Supplemental O2 as necesssary   CAD -History of CABG. Mose recent Myoview low risk -Held Atorvastatin given Starting Fluconazole   Hx of Barrett's Esophagus/GERD -Continue Home Omeprazole  Severe Protein Calorie Malnutrition -Appreciated Dietary Recommendations -C/w Lactose Free Nutrition 237 mL po TIDwm -C/w  Resource Thickenup Clear po for Dysphagia 1 Diet with Nectar Thick  Liquids  Elevated Troponin -Likely 2/2 to demand ischemia from Atrial Fibrillation -Flat trend at 0.06. No chest pain. -Echo done and showed The estimated ejection fraction was in the range of 50% to 55%. Mild hypokinesis of the anteroseptal and inferoseptal myocardium  Generalized Weakness -Patient is frail and elderly. -Pt evaluated and recommending SNF -Social Work [facility]ulted for Skilled Nursing Placement  Chronic Thrombocytopenia, improved -Patient's Platelet Count went from 134 -> 145 -> 168 -Repeat CBC in AM  Hyperlipidemia -Hold Atorvastatin 80 mg po qHS due to elevated LFTs and while patient is on Fluconazole   Abnormal LFT's -Unclear Etiology -AST went from 194 -> 184 -> 211 -> 250 -ALT went from 106 -> 102 -> 110 -> 133 -Hepatitis Panel was Negative -Checked RUQ Ultrasound and showed no focal lesion identified. Liver was within normal limits in parenchymal echogenicity.  -Continue to Monitor and Repeat CMP in AM at SNF -Hold Atorvastatin while LFT's are elevated and while patient ss on  Empiric Fluconazole   Hypothyroidism -C/w Levothyroxine 88 mcg po Daily  Mild AKI -Patient's BUN/Cr went from 16/0.81 -> 21/1.10 -Given Gentle IVF Rehydration with NS -Repeat CMP in AM at SNF to monitor Renal Function   Hypokalemia -Patient's K+ was 3.3 -Replete Prior to D/C -[facility]  Discharge Instructions  Discharge Instructions    Call MD for:  difficulty breathing, headache or visual disturbances    Complete by:  As directed    Call MD for:  extreme fatigue    Complete by:  As directed    Call MD for:  persistant dizziness or light-headedness    Complete by:  As directed    Call MD for:  persistant nausea and vomiting    Complete by:  As directed    Call MD for:  redness, tenderness, or signs of infection (pain, swelling, redness, odor or green/yellow discharge around incision site)    Complete by:  As directed    Call MD for:  severe  uncontrolled pain    Complete by:  As directed    Call MD for:  temperature >100.4    Complete by:  As directed    Diet - low sodium heart healthy    Complete by:  As directed    Discharge instructions    Complete by:  As directed    Follow up with PCP within 1 week and take all medications as prescribed. If symptoms change or worsen please return to the ED for Evaluation.   Increase activity slowly    Complete by:  As directed      Allergies as of 11/19/2016      Reactions   Codeine    Reaction unknown to patient   Meperidine Hcl    Reaction unknown to patient   Oxycodone-acetaminophen    Reaction unknown to patient      Medication List    STOP taking these medications   bisoprolol 10 MG tablet Commonly known as:  ZEBETA     TAKE these medications   acetaminophen 500 MG tablet Commonly known as:  TYLENOL Take 1 tablet (500 mg total) by mouth every 6 (six) hours as needed for mild pain. For pain   aspirin 81 MG chewable tablet Chew 81 mg by mouth daily.   atorvastatin 80 MG tablet Commonly known as:  LIPITOR Take 80 mg by mouth once a day Start taking on:  11/26/2016  What changed:  See the new instructions.  These instructions start on 11/26/2016. If you are unsure what to do until then, ask your doctor or other care provider.   chlorhexidine 0.12 % solution Commonly known as:  PERIDEX 15 mLs by Mouth Rinse route 2 (two) times daily.   cyanocobalamin 500 MCG tablet Take 500 mcg by mouth daily.   diltiazem 120 MG 24 hr capsule Commonly known as:  TIAZAC Take 1 capsule (120 mg total) by mouth daily.   fluconazole 100 MG tablet Commonly known as:  DIFLUCAN Take 1 tablet (100 mg total) by mouth daily.   folic acid 1 MG tablet Commonly known as:  FOLVITE Take 1 tablet (1 mg total) by mouth daily.   furosemide 20 MG tablet Commonly known as:  LASIX TAKE 1 TABLET DAILY (MUST SCHEDULE ANNUAL EXAM) What changed:  See the new instructions.    ipratropium-albuterol 0.5-2.5 (3) MG/3ML Soln Commonly known as:  DUONEB Take 3 mLs by nebulization every 6 (six) hours as needed.   KLOR-CON M20 20 MEQ tablet Generic drug:  potassium chloride SA TAKE 1 TABLET DAILY What changed:  See the new instructions.   lactose free nutrition Liqd Take 237 mLs by mouth 2 (two) times daily between meals.   levothyroxine 88 MCG tablet Commonly known as:  SYNTHROID, LEVOTHROID TAKE 1 TABLET DAILY What changed:  See the new instructions.   metoprolol tartrate 25 MG tablet Commonly known as:  LOPRESSOR Take 1 tablet (25 mg total) by mouth 2 (two) times daily.   mirtazapine 45 MG tablet Commonly known as:  REMERON TAKE 1 TABLET AT BEDTIME   mouth rinse Liqd solution 15 mLs by Mouth Rinse route 2 times daily at 12 noon and 4 pm.   nitroGLYCERIN 0.4 MG SL tablet Commonly known as:  NITROSTAT Place 1 tablet (0.4 mg total) under the tongue every 5 (five) minutes x 3 doses as needed for chest pain.   omeprazole 20 MG capsule Commonly known as:  PRILOSEC TAKE 1 CAPSULE DAILY What changed:  See the new instructions.   ondansetron 4 MG tablet Commonly known as:  ZOFRAN Take 1 tablet (4 mg total) by mouth every 6 (six) hours as needed for nausea.   polyethylene glycol packet Commonly known as:  MIRALAX / GLYCOLAX Take 17 g by mouth daily as needed for mild constipation.   QUEtiapine 25 MG tablet Commonly known as:  SEROQUEL TAKE 1 TABLET AT BEDTIME What changed:  See the new instructions.   RESOURCE THICKENUP CLEAR Powd Take 1 Container by mouth as needed.   sennosides-docusate sodium 8.6-50 MG tablet Commonly known as:  SENOKOT-S Take 1 tablet by mouth daily as needed for constipation. Hold for loose stool   sertraline 25 MG tablet Commonly known as:  ZOLOFT Take 2 tablets (50 mg total) by mouth daily.   thiamine 100 MG tablet Take 1 tablet (100 mg total) by mouth daily.   vitamin E 400 UNIT capsule Take 400 Units by mouth  daily.       Contact information for follow-up providers    Lorre Munroe, NP. Call.   Specialty:  Internal Medicine Why:  Call to Schedule an appointment after D/C from SNF Contact information: [address] [place] [address] [phone]            Contact information for after-discharge care    Destination    [facility] Follow up.   Specialty:  Skilled Nursing Facility Contact information:  [address] [place] [address] [phone]                 Allergies  Allergen Reactions  . Codeine     Reaction unknown to patient  . Meperidine Hcl     Reaction unknown to patient  . Oxycodone-Acetaminophen     Reaction unknown to patient   Consultations: -Gastroenterology -SLP -PT/OT  Procedures/Studies: Dg Chest 2 View  Result Date: 11/16/2016 CLINICAL DATA:  Generalized weakness for 2 weeks. EXAM: CHEST  2 VIEW COMPARISON:  08/11/2016 FINDINGS: Chronic coarse lung markings. Few patchy densities at the lung bases may be chronic. Heart and mediastinum are within normal limits. Thoracic aorta is heavily calcified. Prior median sternotomy and evidence of prior CABG procedure. No large areas of airspace disease or consolidation. No large pleural effusions. IMPRESSION: Chronic lung changes without acute findings. Aortic atherosclerosis. Electronically Signed   By: Richarda Overlie M.D.   On: 11/16/2016 16:33   Ct Head Wo Contrast  Result Date: 11/16/2016 CLINICAL DATA:  Generalized weakness.  Unable to stand. EXAM: CT HEAD WITHOUT CONTRAST TECHNIQUE: Contiguous axial images were obtained from the base of the skull through the vertex without intravenous contrast. COMPARISON:  CT head without contrast 07/16/2016 FINDINGS: Brain: Moderate atrophy and white matter disease is stable. No acute infarct, hemorrhage, or mass lesion is present. Remote is infarcts of the thalami are again noted. The basal ganglia are otherwise intact.  Insular ribbon is normal bilaterally. The ventricles are proportionate to the degree of atrophy. No significant extra-axial fluid collection is present. The brainstem and cerebellum are otherwise within normal limits. Vascular: Atherosclerotic calcifications are present within the cavernous internal carotid artery's bilaterally. Calcifications are noted at the dural margin of the vertebral arteries. There is no hyperdense vessel. Skull: The calvarium is intact. No focal lytic or blastic lesions are present. Sinuses/Orbits: The paranasal sinuses and mastoid air cells are clear. Bilateral lens replacements are present. The globes and orbits are within normal limits bilaterally. IMPRESSION: 1. Stable moderate atrophy and white matter disease. This likely reflects the sequela of chronic microvascular ischemia. 2. Remote lacunar infarcts involving the thalami bilaterally. 3. No acute intracranial abnormality. 4. Atherosclerosis. Electronically Signed   By: Marin Roberts M.D.   On: 11/16/2016 16:41   Dg Esophagus  Result Date: 11/18/2016 CLINICAL DATA:  Dysphasia.  History of Barrett's esophagus. EXAM: ESOPHOGRAM/BARIUM SWALLOW TECHNIQUE: Single contrast examination was performed using  thick barium. FLUOROSCOPY TIME:  Fluoroscopy Time:  1 minutes, 30 seconds. Number of Acquired Spot Images: 0 COMPARISON:  None. FINDINGS: Normal esophageal course and contour. There is reduced frequency of the normal esophageal peristalsis with increased tertiary contractions. There is no obstruction to the forward flow of contrast through the esophagus into the stomach. No esophageal stricture, ulceration, or other significant abnormality. IMPRESSION: 1. Reduced frequency of normal esophageal peristalsis and increased tertiary contractions, which may reflect presbyesophagus. 2. No evidence of esophageal stricture. Electronically Signed   By: Obie Dredge M.D.   On: 11/18/2016 14:32   Dg Swallowing Func-speech  Pathology  Result Date: 11/18/2016 Objective Swallowing Evaluation: Type of Study: MBS-Modified Barium Swallow Study Patient Details Name: Jasmine [name] MRN: [id number] Date of Birth: July 12, 1922 Today's Date: 11/18/2016 Time: SLP Start Time (ACUTE ONLY): 1305-SLP Stop Time (ACUTE ONLY): 1330 SLP Time Calculation (min) (ACUTE ONLY): 25 min Past Medical History: Past Medical History: Diagnosis Date . Anxiety  . Atrial fibrillation with RVR (HCC)  . Barrett's esophagus  . CAD (  coronary artery disease) of artery bypass graft 1995 . Fracture of right shoulder 02/2008 . GERD (gastroesophageal reflux disease)  . Hemorrhoids, external  . Hiatal hernia  . History of colon polyps  . Hyperlipidemia  . Hypertension  . Hypothyroidism  . Insomnia due to medical condition  . Left wrist fracture 09/2007 . Low back pain  . Syncope  Past Surgical History: Past Surgical History: Procedure Laterality Date . CARDIAC CATHETERIZATION  02/25/1996  No intervention-recommend CABG. Left main-80% concentric distal stenosis, LAD-99% ostial stenosis, first diag-80% segmental proximal stenosis, Circumflex-80% ostial stenosis, RCA-90% ostial stenosis. Marland Kitchen CARDIAC CATHETERIZATION  05/21/2003  No intervention-widely patent grafts. . CAROTID DOPPLER Bilateral 05/15/2010  Right Bulb/CEA-demonstrated trace irregular nonhemodynamically significant plaque 0-49%. Left Bulb/Proximal ICA-demonstrated irregular nonhemodynamically signifcant plaque 0-49%. . CAROTID ENDARTERECTOMY   . CATARACT EXTRACTION   . COLONOSCOPY W/ POLYPECTOMY  06/2005 . CORONARY ARTERY BYPASS GRAFT  1997 . LEXISCAN MYOVIEW  04/03/2011  No scintigraphic evidence of inducible myocardial ischemia. No lexiscan EKG changes. Non-diagnostic for ischemia.  . TRANSTHORACIC ECHOCARDIOGRAM  01/08/2011  EF 50-55%, LA moderately dilated, moderate tricuspid regurg, mild pulmonary hypertension. HPI: Pt is a 83 y.o. female with PMH of COPD, Anxiety Depression, diastolic CHF, Afib, HTN, hypothyroidism, Barretts  esophagus, CAD. Presented with 2 week history of worsening lightheadedness gyrus weakness secondary to decreased by mouth intake. Patient had been having prolonged history of pain with swallowing she decreased her by mouth intake didn't provider manage her pain. Progressively became weaker and now only able to stand with assistance as she does not have baseline daughter called primary care provider who recommended patient to be taken to emergency department EMS was called. Mild confusion at baseline. Reports food getting stuck in throat and has to gag to get it back up. Reports trouble swallowing has been gradual the past few months starting with alrge pieces of food; currently can only swallow fluids but still takes a long time to get them down. CXR negative for acute findings. EGD 2010 revealed barrett's esophagus in distal esophagus 33-36 cm in length, hiatal hernia. Bedside swallow eval ordered. No Data Recorded Assessment / Plan / Recommendation CHL IP CLINICAL IMPRESSIONS 11/18/2016 Clinical Impression Pt with a moderate sensorimotor pharyngeal dysphagia characterized by a delay in swallow initiation to the level of the pyriform sinuses with reduced base of tongue retraction/ reduced epiglottic inversion resulting in silent aspiration of thin liquids, penetration x1 of nectar-thick liquids. Cued pt to cough but cough was weak and pt was unable to clear all of aspirated material. Pt also with moderate residuals in the vallecula and UES; noted to have penetration of residuals post-swallow on 2 occasions. Chin tuck was effective on 4 out of 5 trials with nectar-thick liquids by cup in preventing penetration/ aspiration and clearing some vallecula residuals. Recommend initiating dysphagia 1/ puree diet with nectar-thick liquids by cup only (small sips at a time), all meds crushed in puree, utilize chin tuck strategy, multiple swallows, intermittent cough to clear possible penetrated/ aspirated material. Have pt sit  upright 30 minutes after meal, provide supervision to cue strategies. Pt still remains at risk of aspirating these consistencies or aspirating post-swallow residuals. Will continue to follow for diet tolerance/ review of compensatory strategies.  SLP Visit Diagnosis Dysphagia, pharyngoesophageal phase (R13.14) Attention and concentration deficit following -- Frontal lobe and executive function deficit following -- Impact on safety and function Moderate aspiration risk;Severe aspiration risk   CHL IP TREATMENT RECOMMENDATION 11/18/2016 Treatment Recommendations Therapy as outlined in treatment plan below  Prognosis 11/18/2016 Prognosis for Safe Diet Advancement Good Barriers to Reach Goals -- Barriers/Prognosis Comment -- CHL IP DIET RECOMMENDATION 11/18/2016 SLP Diet Recommendations Dysphagia 1 (Puree) solids;Nectar thick liquid Liquid Administration via Cup;No straw Medication Administration Crushed with puree Compensations Slow rate;Small sips/bites;Multiple dry swallows after each bite/sip;Clear throat intermittently;Chin tuck Postural Changes Remain semi-upright after after feeds/meals (Comment);Seated upright at 90 degrees   CHL IP OTHER RECOMMENDATIONS 11/18/2016 Recommended Consults -- Oral Care Recommendations Oral care BID Other Recommendations Order thickener from pharmacy;Clarify dietary restrictions   CHL IP FOLLOW UP RECOMMENDATIONS 11/18/2016 Follow up Recommendations [facility] D[facility] 11/18/2016 Speech Therapy Frequency (ACUTE ONLY) min 3x week Treatment Duration 2 weeks      CHL IP ORAL PHASE 11/18/2016 Oral Phase WFL Oral - Pudding Teaspoon -- Oral - Pudding Cup -- Oral - Honey Teaspoon -- Oral - Honey Cup -- Oral - Nectar Teaspoon -- Oral - Nectar Cup -- Oral - Nectar Straw -- Oral - Thin Teaspoon -- Oral - Thin Cup -- Oral - Thin Straw -- Oral - Puree -- Oral - Mech Soft -- Oral - Regular -- Oral - Multi-Consistency -- Oral - Pill -- Oral Phase - Comment --  CHL IP  PHARYNGEAL PHASE 11/18/2016 Pharyngeal Phase Impaired Pharyngeal- Pudding Teaspoon -- Pharyngeal -- Pharyngeal- Pudding Cup -- Pharyngeal -- Pharyngeal- Honey Teaspoon -- Pharyngeal -- Pharyngeal- Honey Cup -- Pharyngeal -- Pharyngeal- Nectar Teaspoon -- Pharyngeal -- Pharyngeal- Nectar Cup Delayed swallow initiation-pyriform sinuses;Reduced epiglottic inversion;Reduced tongue base retraction;Penetration/Aspiration during swallow;Pharyngeal residue - valleculae;Compensatory strategies attempted (with notebox) Pharyngeal Material enters airway, remains ABOVE vocal cords and not ejected out Pharyngeal- Nectar Straw -- Pharyngeal -- Pharyngeal- Thin Teaspoon -- Pharyngeal -- Pharyngeal- Thin Cup Delayed swallow initiation-pyriform sinuses;Reduced epiglottic inversion;Reduced airway/laryngeal closure;Reduced tongue base retraction;Penetration/Aspiration during swallow;Penetration/Apiration after swallow;Pharyngeal residue - valleculae Pharyngeal Material enters airway, passes BELOW cords without attempt by patient to eject out (silent aspiration) Pharyngeal- Thin Straw Delayed swallow initiation-pyriform sinuses;Reduced epiglottic inversion;Reduced airway/laryngeal closure;Reduced tongue base retraction;Penetration/Aspiration during swallow;Penetration/Apiration after swallow;Pharyngeal residue - valleculae Pharyngeal Material enters airway, passes BELOW cords without attempt by patient to eject out (silent aspiration) Pharyngeal- Puree Delayed swallow initiation-vallecula;Reduced epiglottic inversion;Reduced tongue base retraction;Pharyngeal residue - valleculae;Pharyngeal residue - cp segment Pharyngeal -- Pharyngeal- Mechanical Soft Delayed swallow initiation-vallecula;Reduced epiglottic inversion;Reduced tongue base retraction;Pharyngeal residue - valleculae;Pharyngeal residue - cp segment Pharyngeal -- Pharyngeal- Regular -- Pharyngeal -- Pharyngeal- Multi-consistency -- Pharyngeal -- Pharyngeal- Pill -- Pharyngeal  -- Pharyngeal Comment --  CHL IP CERVICAL ESOPHAGEAL PHASE 11/18/2016 Cervical Esophageal Phase Impaired Pudding Teaspoon -- Pudding Cup -- Honey Teaspoon -- Honey Cup -- Nectar Teaspoon -- Nectar Cup Esophageal backflow into cervical esophagus Nectar Straw -- Thin Teaspoon -- Thin Cup Esophageal backflow into cervical esophagus Thin Straw Esophageal backflow into cervical esophagus Puree -- Mechanical Soft -- Regular -- Multi-consistency -- Pill -- Cervical Esophageal Comment -- No flowsheet data found. Amy Cecille AverK Oleksiak, MA, CCC-SLP 11/18/2016, 2:22 PM [phone]             [place] Abdomen Limited Ruq  Result Date: 11/17/2016 CLINICAL DATA:  Elevated liver function tests. EXAM: ULTRASOUND ABDOMEN LIMITED RIGHT UPPER QUADRANT COMPARISON:  None. FINDINGS: Gallbladder: No gallstones or wall thickening visualized. No sonographic Murphy sign noted by sonographer. Common bile duct: Diameter: 1 mm Liver: No focal lesion identified. Within normal limits in parenchymal echogenicity. IMPRESSION: Within normal limits. Electronically Signed   By: Jolaine ClickArthur  Hoss M.D.   On: 11/17/2016 20:15    ECHOCARDIOGRAM -  Study Conclusions  - Left ventricle: The cavity size was normal. Systolic function was   normal. The estimated ejection fraction was in the range of 50%   to 55%. Mild hypokinesis of the anteroseptal and inferoseptal   myocardium. - Aortic valve: Transvalvular velocity was within the normal range.   There was no stenosis. There was no regurgitation. - Mitral valve: Transvalvular velocity was within the normal range.   There was no evidence for stenosis. There was mild regurgitation   directed posteriorly. - Left atrium: The appendage was moderately dilated. - Right ventricle: The cavity size was normal. Wall thickness was   normal. Systolic function was normal. - Right atrium: The atrium was severely dilated. - Tricuspid valve: There was moderate regurgitation. - Pulmonary arteries: Systolic pressure was moderately  increased.   PA peak pressure: 58 mm Hg (S).  Subjective: Seen and examined at beside and was feeling better. Stated she had no trouble swallowing today. No CP or SOB. No Nausea or vomiting but states she is still extremely weak.  Discharge Exam: Vitals:   11/18/16 2119 11/19/16 0557  BP: 101/64 118/74  Pulse: 87 92  Resp: 17 17  Temp: 97.8 F (36.6 C) 98.7 F (37.1 C)   Vitals:   11/18/16 0902 11/18/16 1433 11/18/16 2119 11/19/16 0557  BP: (!) 152/96 (!) 113/58 101/64 118/74  Pulse: (!) 133 86 87 92  Resp:  16 17 17   Temp:  97.8 F (36.6 C) 97.8 F (36.6 C) 98.7 F (37.1 C)  TempSrc:  Oral Oral Oral  SpO2:  97% 94% 94%  Weight:      Height:       General: Pt is a thin cachectic elderly female who is alert, awake, not in acute distress Cardiovascular: Irregularly Irregular , S1/S2 +, no rubs, no gallops Respiratory: Diminished bilaterally, no wheezing, no rhonchi Abdominal: Soft, NT, ND, bowel sounds + Extremities: no edema, no cyanosis  The results of significant diagnostics from this hospitalization (including imaging, microbiology, ancillary and laboratory) are listed below for reference.    Microbiology: No results found for this or any previous visit (from the past 240 hour(s)).   Labs: BNP (last 3 results) No results for input(s): BNP in the last 8760 hours. Basic Metabolic Panel:  Recent Labs Lab 11/16/16 1350 11/17/16 0336 11/17/16 0632 11/18/16 0945 11/19/16 0430  NA 139 141  --  136 139  K 4.7 3.7  --  3.9 3.3*  CL 100* 103  --  105 104  CO2 28 27  --  20* 27  GLUCOSE 121* 85  --  69 85  BUN 20 15  --  16 21*  CREATININE 1.13* 0.92  --  0.81 1.10*  CALCIUM 8.8* 8.1*  --  8.3* 8.3*  MG 2.1  --  1.9 1.9 2.0  PHOS  --   --  3.2 3.2 3.2   Liver Function Tests:  Recent Labs Lab 11/16/16 1350 11/17/16 0336 11/18/16 0945 11/19/16 0430  AST 194* 184* 211* 250*  ALT 106* 102* 110* 133*  ALKPHOS 89 82 74 85  BILITOT 1.1 1.0 1.4* 0.9  PROT  6.4* 5.8* 5.8* 5.5*  ALBUMIN 3.0* 2.6* 2.5* 2.6*   No results for input(s): LIPASE, AMYLASE in the last 168 hours. No results for input(s): AMMONIA in the last 168 hours. CBC:  Recent Labs Lab 11/16/16 1350 11/17/16 0336 11/18/16 0945 11/19/16 0430  WBC 8.0 7.1 8.4 8.9  NEUTROABS 6.8  --  7.1 7.3  HGB 13.7 13.6 14.1 14.7  HCT 42.8 41.3 43.5 44.2  MCV 94.1 93.4 92.8 92.7  PLT 148* 134* 145* 168   Cardiac Enzymes:  Recent Labs Lab 11/16/16 1350 11/16/16 1939 11/17/16 0336 11/17/16 0632  CKTOTAL  --  912*  --   --   TROPONINI 0.05* 0.06* 0.06* 0.06*   BNP: Invalid input(s): POCBNP CBG: No results for input(s): GLUCAP in the last 168 hours. D-Dimer No results for input(s): DDIMER in the last 72 hours. Hgb A1c No results for input(s): HGBA1C in the last 72 hours. Lipid Profile No results for input(s): CHOL, HDL, LDLCALC, TRIG, CHOLHDL, LDLDIRECT in the last 72 hours. Thyroid function studies  Recent Labs  11/17/16 0336  TSH 1.311   Anemia work up No results for input(s): VITAMINB12, FOLATE, FERRITIN, TIBC, IRON, RETICCTPCT in the last 72 hours. Urinalysis    Component Value Date/Time   COLORURINE YELLOW 11/16/2016 1730   APPEARANCEUR CLEAR 11/16/2016 1730   LABSPEC 1.014 11/16/2016 1730   PHURINE 5.0 11/16/2016 1730   GLUCOSEU NEGATIVE 11/16/2016 1730   HGBUR SMALL (A) 11/16/2016 1730   HGBUR 1+ 05/06/2010 1520   BILIRUBINUR NEGATIVE 11/16/2016 1730   KETONESUR NEGATIVE 11/16/2016 1730   PROTEINUR NEGATIVE 11/16/2016 1730   UROBILINOGEN 1.0 01/22/2015 1721   NITRITE NEGATIVE 11/16/2016 1730   LEUKOCYTESUR NEGATIVE 11/16/2016 1730   Sepsis Labs Invalid input(s): PROCALCITONIN,  WBC,  LACTICIDVEN Microbiology No results found for this or any previous visit (from the past 240 hour(s)).  Time coordinating discharge: 35 minutes  SIGNED:  Merlene Laughter, DO [facility] 11/19/2016, 2:38 PM Pager [phone]  If 7PM-7AM, please contact  night-coverage [email or web address] Password [email or web address]

## 2016-11-19 NOTE — Progress Notes (Signed)
Occupational Therapy Treatment Patient Details Name: Jasmine [name] MRN: [id number] DOB: 09-26-1922 Today's Date: 11/19/2016    History of present illness Pt admitted with weakness and difficulty/painful swallowing with poor intake. PMH: COPD, anxiety, depression, CHF, afib, CAD with CABG, hypothyroidism, barretts esophagus, mild memory deficit.   OT comments  Focus of session on toileting and seated grooming. Pt needing encouragement to remain up in chair. Refusing lunch meal.  Follow Up Recommendations  SNF;Supervision/Assistance - 24 hour    Equipment Recommendations  None recommended by OT    Recommendations for Other Services      Precautions / Restrictions Precautions Precautions: Fall Restrictions Weight Bearing Restrictions: No       Mobility Bed Mobility Overal bed mobility: Needs Assistance Bed Mobility: Supine to Sit     Supine to sit: Min assist     General bed mobility comments: assisted for LEs and trunk minimally, encouragement to put forth best effort  Transfers Overall transfer level: Needs assistance Equipment used: 1 person hand held assist Transfers: Sit to/from Stand;Stand Pivot Transfers Sit to Stand: Min assist Stand pivot transfers: Min assist       General transfer comment: [facility] to [facility] and then to recliner    Balance Overall balance assessment: Needs assistance   Sitting balance-Leahy Scale: Fair       Standing balance-Leahy Scale: Poor                             ADL either performed or assessed with clinical judgement   ADL Overall ADL's : Needs assistance/impaired     Grooming: Wash/dry hands;Wash/dry face;Sitting;Set up                   Toilet Transfer: Minimal assistance;Stand-pivot;BSC   Toileting- Clothing Manipulation and Hygiene: Minimal assistance;Sit to/from stand               Vision       Perception     Praxis      Cognition Arousal/Alertness: Awake/alert Behavior  During Therapy: WFL for tasks assessed/performed Overall Cognitive Status: History of cognitive impairments - at baseline                                 General Comments: hx of mild memory deficits        Exercises   Shoulder Instructions       General Comments      Pertinent Vitals/ Pain       Pain Assessment: No/denies pain  Home Living                                          Prior Functioning/Environment              Frequency  Min 2X/week        Progress Toward Goals  OT Goals(current goals can now be found in the care plan section)  Progress towards OT goals: Progressing toward goals  Acute Rehab OT Goals Patient Stated Goal: to get stronger OT Goal Formulation: With patient Time For Goal Achievement: 12/01/16 Potential to Achieve Goals: Good  Plan Discharge plan remains appropriate    Co-evaluation                 AM-PAC PT "6 Clicks"  Daily Activity     Outcome Measure   Help from another person eating meals?: None Help from another person taking care of personal grooming?: A Little Help from another person toileting, which includes using toliet, bedpan, or urinal?: A Little Help from another person bathing (including washing, rinsing, drying)?: A Lot Help from another person to put on and taking off regular upper body clothing?: A Little Help from another person to put on and taking off regular lower body clothing?: A Lot 6 Click Score: 17    End of Session Equipment Utilized During Treatment: Gait belt  OT Visit Diagnosis: Unsteadiness on feet (R26.81);Muscle weakness (generalized) (M62.81);Other symptoms and signs involving cognitive function   Activity Tolerance Patient limited by fatigue   Patient Left in chair;with call bell/phone within reach   Nurse Communication  (aware of IV alarming)        Time: 1328-1401 OT Time Calculation (min): 33 min  Charges: OT General Charges $OT Visit: 1  Procedure OT Treatments $Self Care/Home Management : 23-37 mins     Evern BioMayberry, Else Habermann Lynn 11/19/2016, 2:16 PM  [phone]

## 2016-11-19 NOTE — Progress Notes (Signed)
Pt prepared for d/c to SNF. IV d/c'd. Skin intact except as charted in most recent assessments. Vitals are stable. Report called to receiving facility. Pt to be transported by ambulance service. 

## 2016-11-19 NOTE — Clinical Social Work Note (Addendum)
Clinical Social Worker facilitated patient discharge including contacting patient family and facility to confirm patient discharge plans.  Clinical information faxed to facility and family agreeable with plan.  CSW arranged ambulance transport via [facility] to [place] .  RN to call [phone] Andersonfor report prior to discharge. Patient will go to room 706P.  Clinical Social Worker will sign off for now as social work intervention is no longer needed. Please consult us again if new need arises.  HilhamBridget Casimer [place], [place] [phone]

## 2016-11-20 DIAGNOSIS — I509 Heart failure, unspecified: Secondary | ICD-10-CM | POA: Diagnosis not present

## 2016-11-20 DIAGNOSIS — I4891 Unspecified atrial fibrillation: Secondary | ICD-10-CM | POA: Diagnosis not present

## 2016-11-20 DIAGNOSIS — I2510 Atherosclerotic heart disease of native coronary artery without angina pectoris: Secondary | ICD-10-CM | POA: Diagnosis not present

## 2016-11-20 DIAGNOSIS — J449 Chronic obstructive pulmonary disease, unspecified: Secondary | ICD-10-CM | POA: Diagnosis not present

## 2016-11-23 ENCOUNTER — Telehealth: Payer: Self-pay

## 2016-11-23 NOTE — Telephone Encounter (Signed)
V/M left (no name or contact # left) requesting R Baity NP not refill any meds for pt at this time. Pt is in nursing home and does not want meds refilled unless pt is able to go back home. FYI to Pamala Hurry Baity NP.

## 2016-11-23 NOTE — Telephone Encounter (Signed)
noted 

## 2016-11-26 DIAGNOSIS — J449 Chronic obstructive pulmonary disease, unspecified: Secondary | ICD-10-CM | POA: Diagnosis not present

## 2016-11-26 DIAGNOSIS — I4891 Unspecified atrial fibrillation: Secondary | ICD-10-CM | POA: Diagnosis not present

## 2016-11-26 DIAGNOSIS — I509 Heart failure, unspecified: Secondary | ICD-10-CM | POA: Diagnosis not present

## 2016-11-26 DIAGNOSIS — I2510 Atherosclerotic heart disease of native coronary artery without angina pectoris: Secondary | ICD-10-CM | POA: Diagnosis not present

## 2016-12-04 DIAGNOSIS — J449 Chronic obstructive pulmonary disease, unspecified: Secondary | ICD-10-CM | POA: Diagnosis not present

## 2016-12-04 DIAGNOSIS — I2510 Atherosclerotic heart disease of native coronary artery without angina pectoris: Secondary | ICD-10-CM | POA: Diagnosis not present

## 2016-12-04 DIAGNOSIS — R1310 Dysphagia, unspecified: Secondary | ICD-10-CM | POA: Diagnosis not present

## 2016-12-04 DIAGNOSIS — I4891 Unspecified atrial fibrillation: Secondary | ICD-10-CM | POA: Diagnosis not present

## 2016-12-07 DIAGNOSIS — L89151 Pressure ulcer of sacral region, stage 1: Secondary | ICD-10-CM | POA: Diagnosis not present

## 2016-12-10 DIAGNOSIS — I2510 Atherosclerotic heart disease of native coronary artery without angina pectoris: Secondary | ICD-10-CM | POA: Diagnosis not present

## 2016-12-10 DIAGNOSIS — I4891 Unspecified atrial fibrillation: Secondary | ICD-10-CM | POA: Diagnosis not present

## 2016-12-10 DIAGNOSIS — I10 Essential (primary) hypertension: Secondary | ICD-10-CM | POA: Diagnosis not present

## 2016-12-10 DIAGNOSIS — J449 Chronic obstructive pulmonary disease, unspecified: Secondary | ICD-10-CM | POA: Diagnosis not present

## 2016-12-24 DIAGNOSIS — I2510 Atherosclerotic heart disease of native coronary artery without angina pectoris: Secondary | ICD-10-CM | POA: Diagnosis not present

## 2016-12-24 DIAGNOSIS — I509 Heart failure, unspecified: Secondary | ICD-10-CM | POA: Diagnosis not present

## 2016-12-24 DIAGNOSIS — J449 Chronic obstructive pulmonary disease, unspecified: Secondary | ICD-10-CM | POA: Diagnosis not present

## 2016-12-24 DIAGNOSIS — I4891 Unspecified atrial fibrillation: Secondary | ICD-10-CM | POA: Diagnosis not present

## 2017-01-14 DIAGNOSIS — R1314 Dysphagia, pharyngoesophageal phase: Secondary | ICD-10-CM | POA: Diagnosis not present

## 2017-01-15 DIAGNOSIS — R1314 Dysphagia, pharyngoesophageal phase: Secondary | ICD-10-CM | POA: Diagnosis not present

## 2017-01-18 DIAGNOSIS — J449 Chronic obstructive pulmonary disease, unspecified: Secondary | ICD-10-CM | POA: Diagnosis not present

## 2017-01-18 DIAGNOSIS — R1314 Dysphagia, pharyngoesophageal phase: Secondary | ICD-10-CM | POA: Diagnosis not present

## 2017-01-19 DIAGNOSIS — R1314 Dysphagia, pharyngoesophageal phase: Secondary | ICD-10-CM | POA: Diagnosis not present

## 2017-01-19 DIAGNOSIS — J449 Chronic obstructive pulmonary disease, unspecified: Secondary | ICD-10-CM | POA: Diagnosis not present

## 2017-01-20 ENCOUNTER — Telehealth: Payer: Self-pay

## 2017-01-20 DIAGNOSIS — E43 Unspecified severe protein-calorie malnutrition: Secondary | ICD-10-CM | POA: Diagnosis not present

## 2017-01-20 DIAGNOSIS — I110 Hypertensive heart disease with heart failure: Secondary | ICD-10-CM | POA: Diagnosis not present

## 2017-01-20 DIAGNOSIS — I2510 Atherosclerotic heart disease of native coronary artery without angina pectoris: Secondary | ICD-10-CM | POA: Diagnosis not present

## 2017-01-20 DIAGNOSIS — J449 Chronic obstructive pulmonary disease, unspecified: Secondary | ICD-10-CM | POA: Diagnosis not present

## 2017-01-20 DIAGNOSIS — F329 Major depressive disorder, single episode, unspecified: Secondary | ICD-10-CM | POA: Diagnosis not present

## 2017-01-20 DIAGNOSIS — I482 Chronic atrial fibrillation: Secondary | ICD-10-CM | POA: Diagnosis not present

## 2017-01-20 DIAGNOSIS — F419 Anxiety disorder, unspecified: Secondary | ICD-10-CM | POA: Diagnosis not present

## 2017-01-20 DIAGNOSIS — K2270 Barrett's esophagus without dysplasia: Secondary | ICD-10-CM | POA: Diagnosis not present

## 2017-01-20 DIAGNOSIS — R1310 Dysphagia, unspecified: Secondary | ICD-10-CM | POA: Diagnosis not present

## 2017-01-20 DIAGNOSIS — I5032 Chronic diastolic (congestive) heart failure: Secondary | ICD-10-CM | POA: Diagnosis not present

## 2017-01-20 NOTE — Telephone Encounter (Signed)
I have not even seen these areas. She is coming in for an appt later this week, I can give her orders after I see her.

## 2017-01-20 NOTE — Telephone Encounter (Signed)
Carla RN with [facility] left v/m that [facility] services were started with pt and Albin Felling also request cb about treating and protecting stage I of sacrum and side of rt foot; not open area.

## 2017-01-21 ENCOUNTER — Encounter: Payer: Self-pay | Admitting: Internal Medicine

## 2017-01-21 ENCOUNTER — Ambulatory Visit (INDEPENDENT_AMBULATORY_CARE_PROVIDER_SITE_OTHER): Payer: Medicare Other | Admitting: Internal Medicine

## 2017-01-21 VITALS — BP 106/64 | HR 102 | Temp 98.2°F | Wt 100.0 lb

## 2017-01-21 DIAGNOSIS — R1311 Dysphagia, oral phase: Secondary | ICD-10-CM | POA: Diagnosis not present

## 2017-01-21 DIAGNOSIS — I482 Chronic atrial fibrillation: Secondary | ICD-10-CM | POA: Diagnosis not present

## 2017-01-21 DIAGNOSIS — I2510 Atherosclerotic heart disease of native coronary artery without angina pectoris: Secondary | ICD-10-CM

## 2017-01-21 DIAGNOSIS — I5032 Chronic diastolic (congestive) heart failure: Secondary | ICD-10-CM | POA: Diagnosis not present

## 2017-01-21 DIAGNOSIS — R531 Weakness: Secondary | ICD-10-CM | POA: Diagnosis not present

## 2017-01-21 DIAGNOSIS — E441 Mild protein-calorie malnutrition: Secondary | ICD-10-CM

## 2017-01-21 DIAGNOSIS — I110 Hypertensive heart disease with heart failure: Secondary | ICD-10-CM | POA: Diagnosis not present

## 2017-01-21 DIAGNOSIS — E43 Unspecified severe protein-calorie malnutrition: Secondary | ICD-10-CM | POA: Diagnosis not present

## 2017-01-21 DIAGNOSIS — E860 Dehydration: Secondary | ICD-10-CM | POA: Diagnosis not present

## 2017-01-21 DIAGNOSIS — J449 Chronic obstructive pulmonary disease, unspecified: Secondary | ICD-10-CM | POA: Diagnosis not present

## 2017-01-21 LAB — COMPREHENSIVE METABOLIC PANEL
ALBUMIN: 3.1 g/dL — AB (ref 3.5–5.2)
ALK PHOS: 102 U/L (ref 39–117)
ALT: 16 U/L (ref 0–35)
AST: 26 U/L (ref 0–37)
BILIRUBIN TOTAL: 0.7 mg/dL (ref 0.2–1.2)
BUN: 15 mg/dL (ref 6–23)
CALCIUM: 8.8 mg/dL (ref 8.4–10.5)
CO2: 35 mEq/L — ABNORMAL HIGH (ref 19–32)
CREATININE: 0.81 mg/dL (ref 0.40–1.20)
Chloride: 99 mEq/L (ref 96–112)
GFR: 70.23 mL/min (ref >60.00)
Glucose, Bld: 104 mg/dL — ABNORMAL HIGH (ref 70–99)
Potassium: 3.8 mEq/L (ref 3.5–5.1)
Sodium: 139 mEq/L (ref 135–145)
TOTAL PROTEIN: 6.3 g/dL (ref 6.0–8.3)

## 2017-01-21 LAB — TSH: TSH: 8.97 u[IU]/mL — AB (ref 0.35–4.50)

## 2017-01-21 LAB — MAGNESIUM: Magnesium: 2.1 mg/dL (ref 1.5–2.5)

## 2017-01-21 LAB — CBC
HCT: 41 % (ref 36.0–46.0)
HEMOGLOBIN: 13.4 g/dL (ref 12.0–15.0)
MCHC: 32.6 g/dL (ref 30.0–36.0)
MCV: 96.4 fl (ref 78.0–100.0)
PLATELETS: 175 10*3/uL (ref 150.0–400.0)
RBC: 4.25 Mil/uL (ref 3.87–5.11)
RDW: 17.1 % — ABNORMAL HIGH (ref 11.5–15.5)
WBC: 4.7 10*3/uL (ref 4.0–10.5)

## 2017-01-21 LAB — T4, FREE: Free T4: 0.99 ng/dL (ref 0.60–1.60)

## 2017-01-21 MED ORDER — OMEPRAZOLE 20 MG PO CPDR: DELAYED_RELEASE_CAPSULE | ORAL | 1 refills | Status: AC

## 2017-01-21 MED ORDER — FUROSEMIDE 20 MG PO TABS: ORAL_TABLET | ORAL | 1 refills | Status: AC

## 2017-01-21 MED ORDER — POTASSIUM CHLORIDE 20 MEQ PO PACK: 20.0000 meq | PACK | Freq: Every day | ORAL | 1 refills | Status: AC

## 2017-01-21 MED ORDER — LEVOTHYROXINE SODIUM 88 MCG PO TABS: ORAL_TABLET | ORAL | 1 refills | Status: AC

## 2017-01-21 MED ORDER — METOPROLOL TARTRATE 25 MG PO TABS: 25.0000 mg | ORAL_TABLET | Freq: Two times a day (BID) | ORAL | 1 refills | Status: AC

## 2017-01-21 MED ORDER — FOLIC ACID 1 MG PO TABS: 1.0000 mg | ORAL_TABLET | Freq: Every day | ORAL | 1 refills | Status: AC

## 2017-01-21 MED ORDER — SERTRALINE HCL 25 MG PO TABS: 50.0000 mg | ORAL_TABLET | Freq: Every day | ORAL | 1 refills | Status: AC

## 2017-01-21 MED ORDER — DILTIAZEM HCL ER BEADS 120 MG PO CP24
120.0000 mg | ORAL_CAPSULE | Freq: Every day | ORAL | 1 refills | Status: AC
Start: 2017-01-21 — End: ?

## 2017-01-21 MED ORDER — QUETIAPINE FUMARATE 25 MG PO TABS
ORAL_TABLET | ORAL | 1 refills | Status: AC
Start: 2017-01-21 — End: ?

## 2017-01-21 MED ORDER — ATORVASTATIN CALCIUM 80 MG PO TABS: ORAL_TABLET | ORAL | 1 refills | Status: AC

## 2017-01-21 MED ORDER — MIRTAZAPINE 45 MG PO TABS
45.0000 mg | ORAL_TABLET | Freq: Every day | ORAL | 1 refills | Status: AC
Start: 2017-01-21 — End: ?

## 2017-01-21 NOTE — Telephone Encounter (Signed)
I let Albin Felling know Regina's instructions.

## 2017-01-21 NOTE — Progress Notes (Signed)
Subjective:    Patient ID: Jasmine [name], female    DOB: October 23, 1922, 83 y.o.   MRN: [id number]  HPI  Pt presents to the clinic today for rehab follow up. She went to the ER 7/30 with c/o lightheadedness and weakness. She was found to be dehydrated. She was treated with IVF and Potassium. She was c/o some difficulty swallowing as well. They performed a modificed barium swallow which was essentially normal. She was noted to have thrush and treated with a course of Fluconazole. Her troponin's came back mildly elevated but it was felt this was demand ischemia secondary to Afib. Her liver enzymes were also elevated. They held her statin. Acute hep panel was negative. Abdominal ultrasound was negative. She was sent to [facility] on 8/2 and was discharged 10/2. They discharged her with [facility] OT/PT and a nursing aid 2 days a week to help with bathing. She  Report she is glad to be home. She reports she is sleeping okay. Her appetite is okay. She is not walking anymore, mostly wheelchair bound. She denies pain. Her bowels and bladder are moving normally. His biggest concern is mild difficulty swallowing, especially the Potassium pill. She would like to have it switched to a powder if at all possible.   Review of Systems      Past Medical History:  Diagnosis Date  . Anxiety   . Atrial fibrillation with RVR (HCC)   . Barrett's esophagus   . CAD (coronary artery disease) of artery bypass graft 1995  . Fracture of right shoulder 02/2008  . GERD (gastroesophageal reflux disease)   . Hemorrhoids, external   . Hiatal hernia   . History of colon polyps   . Hyperlipidemia   . Hypertension   . Hypothyroidism   . Insomnia due to medical condition   . Left wrist fracture 09/2007  . Low back pain   . Syncope     Current Outpatient Prescriptions  Medication Sig Dispense Refill  . acetaminophen (TYLENOL) 500 MG tablet Take 1 tablet (500 mg total) by mouth every 6 (six) hours as needed for mild pain.  For pain 30 tablet 0  . aspirin 81 MG chewable tablet Chew 81 mg by mouth daily.    Marland Kitchen atorvastatin (LIPITOR) 80 MG tablet Take 80 mg by mouth once a day 90 tablet 1  . chlorhexidine (PERIDEX) 0.12 % solution 15 mLs by Mouth Rinse route 2 (two) times daily. 120 mL 0  . cyanocobalamin 500 MCG tablet Take 500 mcg by mouth daily.      Marland Kitchen diltiazem (TIAZAC) 120 MG 24 hr capsule Take 1 capsule (120 mg total) by mouth daily. 90 capsule 3  . fluconazole (DIFLUCAN) 100 MG tablet Take 1 tablet (100 mg total) by mouth daily. 6 tablet 0  . folic acid (FOLVITE) 1 MG tablet Take 1 tablet (1 mg total) by mouth daily. 30 tablet 0  . furosemide (LASIX) 20 MG tablet TAKE 1 TABLET DAILY (MUST SCHEDULE ANNUAL EXAM) (Patient taking differently: Take 20 mg by mouth in the morning) 90 tablet 0  . ipratropium-albuterol (DUONEB) 0.5-2.5 (3) MG/3ML SOLN Take 3 mLs by nebulization every 6 (six) hours as needed. (Patient not taking: Reported on 11/16/2016) 360 mL 0  . KLOR-CON M20 20 MEQ tablet TAKE 1 TABLET DAILY (Patient taking differently: Take 20 mEq once a day) 90 tablet 1  . lactose free nutrition (BOOST) LIQD Take 237 mLs by mouth 2 (two) times daily between meals.    Marland Kitchen  levothyroxine (SYNTHROID, LEVOTHROID) 88 MCG tablet TAKE 1 TABLET DAILY (Patient taking differently: Take 88 mcg by mouth in the morning) 90 tablet 3  . Maltodextrin-Xanthan Gum (RESOURCE THICKENUP CLEAR) POWD Take 1 Container by mouth as needed. 1 Can 0  . metoprolol tartrate (LOPRESSOR) 25 MG tablet Take 1 tablet (25 mg total) by mouth 2 (two) times daily. 30 tablet 0  . mirtazapine (REMERON) 45 MG tablet TAKE 1 TABLET AT BEDTIME 90 tablet 1  . mouth rinse LIQD solution 15 mLs by Mouth Rinse route 2 times daily at 12 noon and 4 pm. 946 mL 0  . nitroGLYCERIN (NITROSTAT) 0.4 MG SL tablet Place 1 tablet (0.4 mg total) under the tongue every 5 (five) minutes x 3 doses as needed for chest pain. 25 tablet 12  . omeprazole (PRILOSEC) 20 MG capsule TAKE 1  CAPSULE DAILY (Patient taking differently: Take 20 mg by mouth in the morning before breakfast) 90 capsule 3  . ondansetron (ZOFRAN) 4 MG tablet Take 1 tablet (4 mg total) by mouth every 6 (six) hours as needed for nausea. 20 tablet 0  . polyethylene glycol (MIRALAX / GLYCOLAX) packet Take 17 g by mouth daily as needed for mild constipation. 14 each 0  . QUEtiapine (SEROQUEL) 25 MG tablet TAKE 1 TABLET AT BEDTIME (Patient taking differently: Take 25 mg by mouth at bedtime) 90 tablet 1  . sennosides-docusate sodium (SENOKOT-S) 8.6-50 MG tablet Take 1 tablet by mouth daily as needed for constipation. Hold for loose stool     . sertraline (ZOLOFT) 25 MG tablet Take 2 tablets (50 mg total) by mouth daily. 180 tablet 1  . thiamine 100 MG tablet Take 1 tablet (100 mg total) by mouth daily. 30 tablet 0  . vitamin E 400 UNIT capsule Take 400 Units by mouth daily.       No current facility-administered medications for this visit.     Allergies  Allergen Reactions  . Codeine     Reaction unknown to patient  . Meperidine Hcl     Reaction unknown to patient  . Oxycodone-Acetaminophen     Reaction unknown to patient    Family History  Problem Relation Age of Onset  . Heart disease Mother   . Heart disease Father   . Diabetes Son     Social History   Social History  . Marital status: Widowed    Spouse name: N/A  . Number of children: N/A  . Years of education: N/A   Occupational History  . Not on file.   Social History Main Topics  . Smoking status: Former Smoker    Quit date: 04/20/1968  . Smokeless tobacco: Never Used     Comment: QUIT IN 1970  . Alcohol use No     Comment: STOPPED IN 1970  . Drug use: No  . Sexual activity: Not Currently   Other Topics Concern  . Not on file   Social History Narrative  . No narrative on file     Constitutional: Pt reports fatigue. Denies fever, malaise, headache or abrupt weight changes.  Respiratory: Denies difficulty breathing,  shortness of breath, cough or sputum production.   Cardiovascular: Denies chest pain, chest tightness, palpitations or swelling in the hands or feet.  Gastrointestinal: Denies abdominal pain, bloating, constipation, diarrhea or blood in the stool.  Musculoskeletal: Pt reports weakness. Denies decrease in range of motion, muscle pain or joint pain and swelling.  Neurological: Denies dizziness, difficulty with memory, difficulty with speech or  problems with balance and coordination.  Psych: Pt reports anxiety. Denies depression, SI/HI.  No other specific complaints in a complete review of systems (except as listed in HPI above).  Objective:   Physical Exam   BP 106/64   Pulse (!) 102   Temp 98.2 F (36.8 C) (Oral)   Wt 100 lb (45.4 kg)   SpO2 96%   BMI 17.71 kg/m  Wt Readings from Last 3 Encounters:  01/21/17 100 lb (45.4 kg)  11/17/16 90 lb (40.8 kg)  09/30/16 90 lb (40.8 kg)    General: Appears her stated age, chronically ill appearing, in NAD. Skin: Warm, dry and intact. Bruises noted on left arm. Neck:  Neck supple, trachea midline. No masses, lumps or present.  Cardiovascular: Tachycardic with irregular rhythm.  Pulmonary/Chest: Normal effort and positive vesicular breath sounds. No respiratory distress. No wheezes, rales or ronchi noted.  Abdomen: Soft and nontender. Normal bowel sounds.  Musculoskeletal: In a wheelchair.  Neurological: Alert and oriented.  Psychiatric: Mood and affect normal. Behavior is normal. Judgment and thought content normal.    BMET    Component Value Date/Time   NA 139 11/19/2016 0430   NA 140 08/17/2016   K 3.3 (L) 11/19/2016 0430   CL 104 11/19/2016 0430   CO2 27 11/19/2016 0430   GLUCOSE 85 11/19/2016 0430   BUN 21 (H) 11/19/2016 0430   BUN 13 08/17/2016   CREATININE 1.10 (H) 11/19/2016 0430   CREATININE 1.31 (H) 04/19/2015 1624   CALCIUM 8.3 (L) 11/19/2016 0430   GFRNONAA 42 (L) 11/19/2016 0430   GFRAA 49 (L) 11/19/2016 0430     Lipid Panel     Component Value Date/Time   CHOL 120 11/20/2015 1630   TRIG 53.0 11/20/2015 1630   HDL 58.90 11/20/2015 1630   CHOLHDL 2 11/20/2015 1630   VLDL 10.6 11/20/2015 1630   LDLCALC 51 11/20/2015 1630    CBC    Component Value Date/Time   WBC 8.9 11/19/2016 0430   RBC 4.77 11/19/2016 0430   HGB 14.7 11/19/2016 0430   HCT 44.2 11/19/2016 0430   PLT 168 11/19/2016 0430   MCV 92.7 11/19/2016 0430   MCH 30.8 11/19/2016 0430   MCHC 33.3 11/19/2016 0430   RDW 13.9 11/19/2016 0430   LYMPHSABS 0.9 11/19/2016 0430   MONOABS 0.6 11/19/2016 0430   EOSABS 0.1 11/19/2016 0430   BASOSABS 0.0 11/19/2016 0430    Hgb A1C Lab Results  Component Value Date   HGBA1C 6.0 (H) 01/23/2015           Assessment & Plan:   Hospital/Rehab Follow Up for Weakness, Dehydration, Difficulty Swallowing, Malnutrition:  Hospital notes, labs and imaging reviewed  She will continue to work with home health We will continue current meds at this time other than changing the potassium from a tab to a powder Advised her to consume Boost as a nutritional supplement CBC, CMET, TSH and Free T4 today  RTC in 4 months for your Medicare Wellness Exam Nicki Reaper, NP

## 2017-01-21 NOTE — Patient Instructions (Signed)
Malnutrition Malnutrition is any condition in which nutrition is poor. There are many forms of malnutrition. A common form is having too little of one kind of nutrient (nutritional deficiency). Nutrients include proteins, minerals, carbohydrates, fats, and vitamins. They provide the body with energy and keep the body working normally. Malnutrition ranges from mild to severe. The condition affects the body's defense system (immune system). Because of this, people who are malnourished are more likely to develop health problems and get sick. What are the causes? Causes of malnutrition include:  Eating an unbalanced diet.  Eating too much of certain foods.  Eating too little.  Conditions that decrease the body's ability to use nutrients.  What increases the risk? Risk factors include:  Pregnancy and lactation. Women who are pregnant may become malnourished if they do not increase their nutrient intake. They are also susceptible to folic acid deficiency.  Increasing age. The body's ability to absorb nutrients decreases with age. This can contribute to iron, calcium, and vitamin D deficiencies.  Alcohol or drug dependency. Addiction often leads to a lifestyle in which proper nourishment is ignored. Dependency can also hurt the metabolism and the body's ability to absorb nutrients. Alcoholism is a major cause of thiamine deficiency and can lead to deficiencies of magnesium, zinc, and other vitamins.  Eating disorders, such as anorexia nervosa. People with these disorders may eat too little or too much.  Chewing or swallowing problems. People with these disorders may not eat enough.  Certain diseases, including: ? Long-lasting (chronic) diseases. Chronic diseases tend to affect the absorption of calcium, iron, and vitamins B12, A, D, E, and K. ? Liver disease. Liver disease affects the storage of vitamins A and B12. It also interferes with the metabolism of protein and energy sources. ? Kidney  disease. Kidney disease may cause deficiencies of protein, iron, and vitamin D. ? Cancer or AIDS. These diseases can cause a loss of appetite. ? Cystic fibrosis. This disease can make it difficult for the body to absorb nutrients.  Certain diets, including. ? The vegetarian diet. Vegetarians are at risk for iron deficiency. ? The vegan diet. Vegans are susceptible to vitamin B12, calcium, iron, vitamin D, and zinc deficiencies. ? The fruitarian diet. This diet can be deficient in protein, sodium, and many micronutrients. ? Many commercial "fad" diets, including those that claim to enhance well-being and reduce weight. ? Very low calorie diets.  Low income. People with a low income may have trouble paying for nutritious foods.  What are the signs or symptoms? Signs and symptoms depend on the kind of malnutrition you have. Common symptoms include:  Fatigue.  Weakness.  Dizziness.  Fainting  Weight loss.  Poor immune response.  Lack of menstruation.  Hair loss.  Poor memory.  How is this diagnosed? Malnutrition may be diagnosed by:  A medical history.  A dietary history.  A physical exam. This may include a measurement of your body mass index (BMI).  Blood tests.  How is this treated? Treatments vary depending on the cause of the malnutrition. Common treatments include:  Dietary changes.  Dietary supplements, such as vitamins and minerals.  Treatment of any underlying conditions.  Follow these instructions at home:  Eat a balanced diet.  Take dietary supplements as directed by your health care provider.  Exercise regularly. Exercising can improve appetite.  Keep all follow-up visits as directed by your health care provider. This is important. How is this prevented? Eating a well-balanced diet helps to prevent most forms   of malnutrition. Contact a health care provider if:  You have increased weakness or fatigue.  You faint.  You stop  menstruating.  You have rapid hair loss.  You have unexpected weight loss. This information is not intended to replace advice given to you by your health care provider. Make sure you discuss any questions you have with your health care provider. Document Released: 02/20/2005 Document Revised: 09/12/2015 Document Reviewed: 12/01/2013 Elsevier Interactive Patient Education  2018 Elsevier Inc.  

## 2017-01-22 DIAGNOSIS — E43 Unspecified severe protein-calorie malnutrition: Secondary | ICD-10-CM | POA: Diagnosis not present

## 2017-01-22 DIAGNOSIS — J449 Chronic obstructive pulmonary disease, unspecified: Secondary | ICD-10-CM | POA: Diagnosis not present

## 2017-01-22 DIAGNOSIS — I110 Hypertensive heart disease with heart failure: Secondary | ICD-10-CM | POA: Diagnosis not present

## 2017-01-22 DIAGNOSIS — I482 Chronic atrial fibrillation: Secondary | ICD-10-CM | POA: Diagnosis not present

## 2017-01-22 DIAGNOSIS — I2510 Atherosclerotic heart disease of native coronary artery without angina pectoris: Secondary | ICD-10-CM | POA: Diagnosis not present

## 2017-01-22 DIAGNOSIS — I5032 Chronic diastolic (congestive) heart failure: Secondary | ICD-10-CM | POA: Diagnosis not present

## 2017-01-23 DIAGNOSIS — E43 Unspecified severe protein-calorie malnutrition: Secondary | ICD-10-CM | POA: Diagnosis not present

## 2017-01-23 DIAGNOSIS — I482 Chronic atrial fibrillation: Secondary | ICD-10-CM | POA: Diagnosis not present

## 2017-01-23 DIAGNOSIS — I110 Hypertensive heart disease with heart failure: Secondary | ICD-10-CM | POA: Diagnosis not present

## 2017-01-23 DIAGNOSIS — I5032 Chronic diastolic (congestive) heart failure: Secondary | ICD-10-CM | POA: Diagnosis not present

## 2017-01-23 DIAGNOSIS — J449 Chronic obstructive pulmonary disease, unspecified: Secondary | ICD-10-CM | POA: Diagnosis not present

## 2017-01-23 DIAGNOSIS — I2510 Atherosclerotic heart disease of native coronary artery without angina pectoris: Secondary | ICD-10-CM | POA: Diagnosis not present

## 2017-01-25 DIAGNOSIS — I110 Hypertensive heart disease with heart failure: Secondary | ICD-10-CM | POA: Diagnosis not present

## 2017-01-25 DIAGNOSIS — I2510 Atherosclerotic heart disease of native coronary artery without angina pectoris: Secondary | ICD-10-CM | POA: Diagnosis not present

## 2017-01-25 DIAGNOSIS — E43 Unspecified severe protein-calorie malnutrition: Secondary | ICD-10-CM | POA: Diagnosis not present

## 2017-01-25 DIAGNOSIS — I5032 Chronic diastolic (congestive) heart failure: Secondary | ICD-10-CM | POA: Diagnosis not present

## 2017-01-25 DIAGNOSIS — J449 Chronic obstructive pulmonary disease, unspecified: Secondary | ICD-10-CM | POA: Diagnosis not present

## 2017-01-25 DIAGNOSIS — I482 Chronic atrial fibrillation: Secondary | ICD-10-CM | POA: Diagnosis not present

## 2017-01-26 ENCOUNTER — Telehealth: Payer: Self-pay

## 2017-01-26 DIAGNOSIS — J449 Chronic obstructive pulmonary disease, unspecified: Secondary | ICD-10-CM | POA: Diagnosis not present

## 2017-01-26 DIAGNOSIS — E43 Unspecified severe protein-calorie malnutrition: Secondary | ICD-10-CM | POA: Diagnosis not present

## 2017-01-26 DIAGNOSIS — I2510 Atherosclerotic heart disease of native coronary artery without angina pectoris: Secondary | ICD-10-CM | POA: Diagnosis not present

## 2017-01-26 DIAGNOSIS — I482 Chronic atrial fibrillation: Secondary | ICD-10-CM | POA: Diagnosis not present

## 2017-01-26 DIAGNOSIS — I110 Hypertensive heart disease with heart failure: Secondary | ICD-10-CM | POA: Diagnosis not present

## 2017-01-26 DIAGNOSIS — I5032 Chronic diastolic (congestive) heart failure: Secondary | ICD-10-CM | POA: Diagnosis not present

## 2017-01-26 NOTE — Telephone Encounter (Signed)
Ok for HHPT 

## 2017-01-26 NOTE — Telephone Encounter (Signed)
Vernona Rieger with [facility] left v/m requesting verbal orders for [facility] PT 2 x a week for 6 weeks.

## 2017-01-26 NOTE — Telephone Encounter (Signed)
Spoke to Vernona Rieger and provided home health orders as requested

## 2017-01-27 ENCOUNTER — Telehealth: Payer: Self-pay

## 2017-01-27 DIAGNOSIS — J449 Chronic obstructive pulmonary disease, unspecified: Secondary | ICD-10-CM | POA: Diagnosis not present

## 2017-01-27 DIAGNOSIS — I482 Chronic atrial fibrillation: Secondary | ICD-10-CM | POA: Diagnosis not present

## 2017-01-27 DIAGNOSIS — I5032 Chronic diastolic (congestive) heart failure: Secondary | ICD-10-CM | POA: Diagnosis not present

## 2017-01-27 DIAGNOSIS — I110 Hypertensive heart disease with heart failure: Secondary | ICD-10-CM | POA: Diagnosis not present

## 2017-01-27 DIAGNOSIS — E43 Unspecified severe protein-calorie malnutrition: Secondary | ICD-10-CM | POA: Diagnosis not present

## 2017-01-27 DIAGNOSIS — I2510 Atherosclerotic heart disease of native coronary artery without angina pectoris: Secondary | ICD-10-CM | POA: Diagnosis not present

## 2017-01-27 NOTE — Telephone Encounter (Signed)
Keep area clean with soap and water -watch for s/s of infection (redness/ swelling/pain or fever)  otc cortisone cream twice daily  F/u if no imp

## 2017-01-27 NOTE — Telephone Encounter (Signed)
Starr nurse with [facility] left v/m;pt has area under lt arm appears to be bite marks from something; does not look like shingles or bed bugs; minimal redness at area; no itching but pt states area hurts; no other symptoms. Starr request cb. Pamala Hurry NP is out of office.

## 2017-01-28 DIAGNOSIS — I110 Hypertensive heart disease with heart failure: Secondary | ICD-10-CM | POA: Diagnosis not present

## 2017-01-28 DIAGNOSIS — I5032 Chronic diastolic (congestive) heart failure: Secondary | ICD-10-CM | POA: Diagnosis not present

## 2017-01-28 DIAGNOSIS — J449 Chronic obstructive pulmonary disease, unspecified: Secondary | ICD-10-CM | POA: Diagnosis not present

## 2017-01-28 DIAGNOSIS — I2510 Atherosclerotic heart disease of native coronary artery without angina pectoris: Secondary | ICD-10-CM | POA: Diagnosis not present

## 2017-01-28 DIAGNOSIS — E43 Unspecified severe protein-calorie malnutrition: Secondary | ICD-10-CM | POA: Diagnosis not present

## 2017-01-28 DIAGNOSIS — I482 Chronic atrial fibrillation: Secondary | ICD-10-CM | POA: Diagnosis not present

## 2017-01-29 NOTE — Telephone Encounter (Signed)
Starr notified of Dr. Royden Purl comments and instructions and verbalized understanding

## 2017-02-01 DIAGNOSIS — R1310 Dysphagia, unspecified: Secondary | ICD-10-CM | POA: Diagnosis not present

## 2017-02-01 DIAGNOSIS — F329 Major depressive disorder, single episode, unspecified: Secondary | ICD-10-CM | POA: Diagnosis not present

## 2017-02-01 DIAGNOSIS — K2270 Barrett's esophagus without dysplasia: Secondary | ICD-10-CM | POA: Diagnosis not present

## 2017-02-01 DIAGNOSIS — E43 Unspecified severe protein-calorie malnutrition: Secondary | ICD-10-CM | POA: Diagnosis not present

## 2017-02-01 DIAGNOSIS — J449 Chronic obstructive pulmonary disease, unspecified: Secondary | ICD-10-CM | POA: Diagnosis not present

## 2017-02-01 DIAGNOSIS — I2510 Atherosclerotic heart disease of native coronary artery without angina pectoris: Secondary | ICD-10-CM | POA: Diagnosis not present

## 2017-02-01 DIAGNOSIS — I110 Hypertensive heart disease with heart failure: Secondary | ICD-10-CM | POA: Diagnosis not present

## 2017-02-01 DIAGNOSIS — I482 Chronic atrial fibrillation: Secondary | ICD-10-CM | POA: Diagnosis not present

## 2017-02-01 DIAGNOSIS — I5032 Chronic diastolic (congestive) heart failure: Secondary | ICD-10-CM | POA: Diagnosis not present

## 2017-02-01 DIAGNOSIS — F419 Anxiety disorder, unspecified: Secondary | ICD-10-CM | POA: Diagnosis not present

## 2017-02-02 DIAGNOSIS — I2510 Atherosclerotic heart disease of native coronary artery without angina pectoris: Secondary | ICD-10-CM | POA: Diagnosis not present

## 2017-02-02 DIAGNOSIS — E43 Unspecified severe protein-calorie malnutrition: Secondary | ICD-10-CM | POA: Diagnosis not present

## 2017-02-02 DIAGNOSIS — I482 Chronic atrial fibrillation: Secondary | ICD-10-CM | POA: Diagnosis not present

## 2017-02-02 DIAGNOSIS — I5032 Chronic diastolic (congestive) heart failure: Secondary | ICD-10-CM | POA: Diagnosis not present

## 2017-02-02 DIAGNOSIS — J449 Chronic obstructive pulmonary disease, unspecified: Secondary | ICD-10-CM | POA: Diagnosis not present

## 2017-02-02 DIAGNOSIS — I110 Hypertensive heart disease with heart failure: Secondary | ICD-10-CM | POA: Diagnosis not present

## 2017-02-03 DIAGNOSIS — I5032 Chronic diastolic (congestive) heart failure: Secondary | ICD-10-CM | POA: Diagnosis not present

## 2017-02-03 DIAGNOSIS — I2510 Atherosclerotic heart disease of native coronary artery without angina pectoris: Secondary | ICD-10-CM | POA: Diagnosis not present

## 2017-02-03 DIAGNOSIS — I482 Chronic atrial fibrillation: Secondary | ICD-10-CM | POA: Diagnosis not present

## 2017-02-03 DIAGNOSIS — E43 Unspecified severe protein-calorie malnutrition: Secondary | ICD-10-CM | POA: Diagnosis not present

## 2017-02-03 DIAGNOSIS — I110 Hypertensive heart disease with heart failure: Secondary | ICD-10-CM | POA: Diagnosis not present

## 2017-02-03 DIAGNOSIS — J449 Chronic obstructive pulmonary disease, unspecified: Secondary | ICD-10-CM | POA: Diagnosis not present

## 2017-02-04 DIAGNOSIS — J449 Chronic obstructive pulmonary disease, unspecified: Secondary | ICD-10-CM | POA: Diagnosis not present

## 2017-02-04 DIAGNOSIS — I2510 Atherosclerotic heart disease of native coronary artery without angina pectoris: Secondary | ICD-10-CM | POA: Diagnosis not present

## 2017-02-04 DIAGNOSIS — I5032 Chronic diastolic (congestive) heart failure: Secondary | ICD-10-CM | POA: Diagnosis not present

## 2017-02-04 DIAGNOSIS — I482 Chronic atrial fibrillation: Secondary | ICD-10-CM | POA: Diagnosis not present

## 2017-02-04 DIAGNOSIS — I110 Hypertensive heart disease with heart failure: Secondary | ICD-10-CM | POA: Diagnosis not present

## 2017-02-04 DIAGNOSIS — E43 Unspecified severe protein-calorie malnutrition: Secondary | ICD-10-CM | POA: Diagnosis not present

## 2017-02-05 DIAGNOSIS — I5032 Chronic diastolic (congestive) heart failure: Secondary | ICD-10-CM | POA: Diagnosis not present

## 2017-02-05 DIAGNOSIS — I482 Chronic atrial fibrillation: Secondary | ICD-10-CM | POA: Diagnosis not present

## 2017-02-05 DIAGNOSIS — J449 Chronic obstructive pulmonary disease, unspecified: Secondary | ICD-10-CM | POA: Diagnosis not present

## 2017-02-05 DIAGNOSIS — I110 Hypertensive heart disease with heart failure: Secondary | ICD-10-CM | POA: Diagnosis not present

## 2017-02-05 DIAGNOSIS — E43 Unspecified severe protein-calorie malnutrition: Secondary | ICD-10-CM | POA: Diagnosis not present

## 2017-02-05 DIAGNOSIS — I2510 Atherosclerotic heart disease of native coronary artery without angina pectoris: Secondary | ICD-10-CM | POA: Diagnosis not present

## 2017-02-09 DIAGNOSIS — I5032 Chronic diastolic (congestive) heart failure: Secondary | ICD-10-CM | POA: Diagnosis not present

## 2017-02-09 DIAGNOSIS — J449 Chronic obstructive pulmonary disease, unspecified: Secondary | ICD-10-CM | POA: Diagnosis not present

## 2017-02-09 DIAGNOSIS — I482 Chronic atrial fibrillation: Secondary | ICD-10-CM | POA: Diagnosis not present

## 2017-02-09 DIAGNOSIS — I2510 Atherosclerotic heart disease of native coronary artery without angina pectoris: Secondary | ICD-10-CM | POA: Diagnosis not present

## 2017-02-09 DIAGNOSIS — E43 Unspecified severe protein-calorie malnutrition: Secondary | ICD-10-CM | POA: Diagnosis not present

## 2017-02-09 DIAGNOSIS — I110 Hypertensive heart disease with heart failure: Secondary | ICD-10-CM | POA: Diagnosis not present

## 2017-02-10 DIAGNOSIS — I2510 Atherosclerotic heart disease of native coronary artery without angina pectoris: Secondary | ICD-10-CM | POA: Diagnosis not present

## 2017-02-10 DIAGNOSIS — I482 Chronic atrial fibrillation: Secondary | ICD-10-CM | POA: Diagnosis not present

## 2017-02-10 DIAGNOSIS — I5032 Chronic diastolic (congestive) heart failure: Secondary | ICD-10-CM | POA: Diagnosis not present

## 2017-02-10 DIAGNOSIS — E43 Unspecified severe protein-calorie malnutrition: Secondary | ICD-10-CM | POA: Diagnosis not present

## 2017-02-10 DIAGNOSIS — I110 Hypertensive heart disease with heart failure: Secondary | ICD-10-CM | POA: Diagnosis not present

## 2017-02-10 DIAGNOSIS — J449 Chronic obstructive pulmonary disease, unspecified: Secondary | ICD-10-CM | POA: Diagnosis not present

## 2017-02-11 DIAGNOSIS — I110 Hypertensive heart disease with heart failure: Secondary | ICD-10-CM | POA: Diagnosis not present

## 2017-02-11 DIAGNOSIS — J449 Chronic obstructive pulmonary disease, unspecified: Secondary | ICD-10-CM | POA: Diagnosis not present

## 2017-02-11 DIAGNOSIS — I2510 Atherosclerotic heart disease of native coronary artery without angina pectoris: Secondary | ICD-10-CM | POA: Diagnosis not present

## 2017-02-11 DIAGNOSIS — I482 Chronic atrial fibrillation: Secondary | ICD-10-CM | POA: Diagnosis not present

## 2017-02-11 DIAGNOSIS — I5032 Chronic diastolic (congestive) heart failure: Secondary | ICD-10-CM | POA: Diagnosis not present

## 2017-02-11 DIAGNOSIS — E43 Unspecified severe protein-calorie malnutrition: Secondary | ICD-10-CM | POA: Diagnosis not present

## 2017-02-16 DIAGNOSIS — J449 Chronic obstructive pulmonary disease, unspecified: Secondary | ICD-10-CM | POA: Diagnosis not present

## 2017-02-16 DIAGNOSIS — I110 Hypertensive heart disease with heart failure: Secondary | ICD-10-CM | POA: Diagnosis not present

## 2017-02-16 DIAGNOSIS — E43 Unspecified severe protein-calorie malnutrition: Secondary | ICD-10-CM | POA: Diagnosis not present

## 2017-02-16 DIAGNOSIS — I5032 Chronic diastolic (congestive) heart failure: Secondary | ICD-10-CM | POA: Diagnosis not present

## 2017-02-16 DIAGNOSIS — I2510 Atherosclerotic heart disease of native coronary artery without angina pectoris: Secondary | ICD-10-CM | POA: Diagnosis not present

## 2017-02-16 DIAGNOSIS — I482 Chronic atrial fibrillation: Secondary | ICD-10-CM | POA: Diagnosis not present

## 2017-02-17 DIAGNOSIS — E43 Unspecified severe protein-calorie malnutrition: Secondary | ICD-10-CM | POA: Diagnosis not present

## 2017-02-17 DIAGNOSIS — J449 Chronic obstructive pulmonary disease, unspecified: Secondary | ICD-10-CM | POA: Diagnosis not present

## 2017-02-17 DIAGNOSIS — I5032 Chronic diastolic (congestive) heart failure: Secondary | ICD-10-CM | POA: Diagnosis not present

## 2017-02-17 DIAGNOSIS — I2510 Atherosclerotic heart disease of native coronary artery without angina pectoris: Secondary | ICD-10-CM | POA: Diagnosis not present

## 2017-02-17 DIAGNOSIS — I482 Chronic atrial fibrillation: Secondary | ICD-10-CM | POA: Diagnosis not present

## 2017-02-17 DIAGNOSIS — I110 Hypertensive heart disease with heart failure: Secondary | ICD-10-CM | POA: Diagnosis not present

## 2017-02-18 DIAGNOSIS — J449 Chronic obstructive pulmonary disease, unspecified: Secondary | ICD-10-CM | POA: Diagnosis not present

## 2017-02-18 DIAGNOSIS — I5032 Chronic diastolic (congestive) heart failure: Secondary | ICD-10-CM | POA: Diagnosis not present

## 2017-02-18 DIAGNOSIS — I482 Chronic atrial fibrillation: Secondary | ICD-10-CM | POA: Diagnosis not present

## 2017-02-18 DIAGNOSIS — E43 Unspecified severe protein-calorie malnutrition: Secondary | ICD-10-CM | POA: Diagnosis not present

## 2017-02-18 DIAGNOSIS — I2510 Atherosclerotic heart disease of native coronary artery without angina pectoris: Secondary | ICD-10-CM | POA: Diagnosis not present

## 2017-02-18 DIAGNOSIS — I110 Hypertensive heart disease with heart failure: Secondary | ICD-10-CM | POA: Diagnosis not present

## 2017-02-22 DIAGNOSIS — I110 Hypertensive heart disease with heart failure: Secondary | ICD-10-CM | POA: Diagnosis not present

## 2017-02-22 DIAGNOSIS — I2510 Atherosclerotic heart disease of native coronary artery without angina pectoris: Secondary | ICD-10-CM | POA: Diagnosis not present

## 2017-02-22 DIAGNOSIS — J449 Chronic obstructive pulmonary disease, unspecified: Secondary | ICD-10-CM | POA: Diagnosis not present

## 2017-02-22 DIAGNOSIS — I482 Chronic atrial fibrillation: Secondary | ICD-10-CM | POA: Diagnosis not present

## 2017-02-22 DIAGNOSIS — E43 Unspecified severe protein-calorie malnutrition: Secondary | ICD-10-CM | POA: Diagnosis not present

## 2017-02-22 DIAGNOSIS — I5032 Chronic diastolic (congestive) heart failure: Secondary | ICD-10-CM | POA: Diagnosis not present

## 2017-02-24 DIAGNOSIS — I482 Chronic atrial fibrillation: Secondary | ICD-10-CM | POA: Diagnosis not present

## 2017-02-24 DIAGNOSIS — I2510 Atherosclerotic heart disease of native coronary artery without angina pectoris: Secondary | ICD-10-CM | POA: Diagnosis not present

## 2017-02-24 DIAGNOSIS — E43 Unspecified severe protein-calorie malnutrition: Secondary | ICD-10-CM | POA: Diagnosis not present

## 2017-02-24 DIAGNOSIS — I110 Hypertensive heart disease with heart failure: Secondary | ICD-10-CM | POA: Diagnosis not present

## 2017-02-24 DIAGNOSIS — I5032 Chronic diastolic (congestive) heart failure: Secondary | ICD-10-CM | POA: Diagnosis not present

## 2017-02-24 DIAGNOSIS — J449 Chronic obstructive pulmonary disease, unspecified: Secondary | ICD-10-CM | POA: Diagnosis not present

## 2017-03-01 ENCOUNTER — Telehealth: Payer: Self-pay | Admitting: Internal Medicine

## 2017-03-01 DIAGNOSIS — I482 Chronic atrial fibrillation: Secondary | ICD-10-CM | POA: Diagnosis not present

## 2017-03-01 DIAGNOSIS — I5032 Chronic diastolic (congestive) heart failure: Secondary | ICD-10-CM | POA: Diagnosis not present

## 2017-03-01 DIAGNOSIS — E43 Unspecified severe protein-calorie malnutrition: Secondary | ICD-10-CM | POA: Diagnosis not present

## 2017-03-01 DIAGNOSIS — I2510 Atherosclerotic heart disease of native coronary artery without angina pectoris: Secondary | ICD-10-CM | POA: Diagnosis not present

## 2017-03-01 DIAGNOSIS — J449 Chronic obstructive pulmonary disease, unspecified: Secondary | ICD-10-CM | POA: Diagnosis not present

## 2017-03-01 DIAGNOSIS — I110 Hypertensive heart disease with heart failure: Secondary | ICD-10-CM | POA: Diagnosis not present

## 2017-03-01 NOTE — Telephone Encounter (Signed)
[name] states pt is NOT being followed by hospice... The sore showed up Fri and has not noticed much of a change but does not want it to get worse... Please advise

## 2017-03-01 NOTE — Telephone Encounter (Signed)
Is she being followed by hospice?

## 2017-03-01 NOTE — Telephone Encounter (Signed)
Pt's daughter, Jasmine [name], called regarding medication for an old bedsore and a new bedsore; Daughter states that the nursing home had sent pheracalazinc body shield with the patient; she states that this may not working for the patient but if the MD decides that this is what they want her to use she will need more; Pt's daughter also says that she can not get the patient to the office because she is bedridden; Pt's daughter's phone number is [phone]; [facility]

## 2017-03-02 NOTE — Telephone Encounter (Signed)
Its hard for me to know what to put on it without seeing it. I would advise her to use whatever they gave her at the SNF

## 2017-03-02 NOTE — Telephone Encounter (Signed)
Spoke to pts daughter [name] and advised per Rene Kocheregina

## 2017-03-03 DIAGNOSIS — E43 Unspecified severe protein-calorie malnutrition: Secondary | ICD-10-CM | POA: Diagnosis not present

## 2017-03-03 DIAGNOSIS — I110 Hypertensive heart disease with heart failure: Secondary | ICD-10-CM | POA: Diagnosis not present

## 2017-03-03 DIAGNOSIS — I482 Chronic atrial fibrillation: Secondary | ICD-10-CM | POA: Diagnosis not present

## 2017-03-03 DIAGNOSIS — I5032 Chronic diastolic (congestive) heart failure: Secondary | ICD-10-CM | POA: Diagnosis not present

## 2017-03-03 DIAGNOSIS — J449 Chronic obstructive pulmonary disease, unspecified: Secondary | ICD-10-CM | POA: Diagnosis not present

## 2017-03-03 DIAGNOSIS — I2510 Atherosclerotic heart disease of native coronary artery without angina pectoris: Secondary | ICD-10-CM | POA: Diagnosis not present

## 2017-03-08 DIAGNOSIS — I5032 Chronic diastolic (congestive) heart failure: Secondary | ICD-10-CM | POA: Diagnosis not present

## 2017-03-08 DIAGNOSIS — I110 Hypertensive heart disease with heart failure: Secondary | ICD-10-CM | POA: Diagnosis not present

## 2017-03-08 DIAGNOSIS — I2510 Atherosclerotic heart disease of native coronary artery without angina pectoris: Secondary | ICD-10-CM | POA: Diagnosis not present

## 2017-03-08 DIAGNOSIS — I482 Chronic atrial fibrillation: Secondary | ICD-10-CM | POA: Diagnosis not present

## 2017-03-08 DIAGNOSIS — E43 Unspecified severe protein-calorie malnutrition: Secondary | ICD-10-CM | POA: Diagnosis not present

## 2017-03-08 DIAGNOSIS — J449 Chronic obstructive pulmonary disease, unspecified: Secondary | ICD-10-CM | POA: Diagnosis not present

## 2017-03-09 DIAGNOSIS — I110 Hypertensive heart disease with heart failure: Secondary | ICD-10-CM | POA: Diagnosis not present

## 2017-03-09 DIAGNOSIS — E43 Unspecified severe protein-calorie malnutrition: Secondary | ICD-10-CM | POA: Diagnosis not present

## 2017-03-09 DIAGNOSIS — I482 Chronic atrial fibrillation: Secondary | ICD-10-CM | POA: Diagnosis not present

## 2017-03-09 DIAGNOSIS — J449 Chronic obstructive pulmonary disease, unspecified: Secondary | ICD-10-CM | POA: Diagnosis not present

## 2017-03-09 DIAGNOSIS — I2510 Atherosclerotic heart disease of native coronary artery without angina pectoris: Secondary | ICD-10-CM | POA: Diagnosis not present

## 2017-03-09 DIAGNOSIS — I5032 Chronic diastolic (congestive) heart failure: Secondary | ICD-10-CM | POA: Diagnosis not present

## 2017-03-10 DIAGNOSIS — E785 Hyperlipidemia, unspecified: Secondary | ICD-10-CM | POA: Diagnosis not present

## 2017-03-10 DIAGNOSIS — M6281 Muscle weakness (generalized): Secondary | ICD-10-CM | POA: Diagnosis not present

## 2017-03-10 DIAGNOSIS — F0630 Mood disorder due to known physiological condition, unspecified: Secondary | ICD-10-CM | POA: Diagnosis not present

## 2017-03-10 DIAGNOSIS — Z0001 Encounter for general adult medical examination with abnormal findings: Secondary | ICD-10-CM | POA: Diagnosis not present

## 2017-03-10 DIAGNOSIS — E43 Unspecified severe protein-calorie malnutrition: Secondary | ICD-10-CM | POA: Diagnosis not present

## 2017-03-10 DIAGNOSIS — Z136 Encounter for screening for cardiovascular disorders: Secondary | ICD-10-CM | POA: Diagnosis not present

## 2017-03-10 DIAGNOSIS — K219 Gastro-esophageal reflux disease without esophagitis: Secondary | ICD-10-CM | POA: Diagnosis not present

## 2017-03-10 DIAGNOSIS — F341 Dysthymic disorder: Secondary | ICD-10-CM | POA: Diagnosis not present

## 2017-03-10 DIAGNOSIS — L89302 Pressure ulcer of unspecified buttock, stage 2: Secondary | ICD-10-CM | POA: Diagnosis not present

## 2017-03-10 DIAGNOSIS — E039 Hypothyroidism, unspecified: Secondary | ICD-10-CM | POA: Diagnosis not present

## 2017-03-10 DIAGNOSIS — D539 Nutritional anemia, unspecified: Secondary | ICD-10-CM | POA: Diagnosis not present

## 2017-03-10 DIAGNOSIS — I2510 Atherosclerotic heart disease of native coronary artery without angina pectoris: Secondary | ICD-10-CM | POA: Diagnosis not present

## 2017-03-10 DIAGNOSIS — J449 Chronic obstructive pulmonary disease, unspecified: Secondary | ICD-10-CM | POA: Diagnosis not present

## 2017-03-10 DIAGNOSIS — I5032 Chronic diastolic (congestive) heart failure: Secondary | ICD-10-CM | POA: Diagnosis not present

## 2017-03-10 DIAGNOSIS — F419 Anxiety disorder, unspecified: Secondary | ICD-10-CM | POA: Diagnosis not present

## 2017-03-10 DIAGNOSIS — I482 Chronic atrial fibrillation: Secondary | ICD-10-CM | POA: Diagnosis not present

## 2017-03-10 DIAGNOSIS — I110 Hypertensive heart disease with heart failure: Secondary | ICD-10-CM | POA: Diagnosis not present

## 2017-03-15 DIAGNOSIS — I2510 Atherosclerotic heart disease of native coronary artery without angina pectoris: Secondary | ICD-10-CM | POA: Diagnosis not present

## 2017-03-15 DIAGNOSIS — J449 Chronic obstructive pulmonary disease, unspecified: Secondary | ICD-10-CM | POA: Diagnosis not present

## 2017-03-15 DIAGNOSIS — I482 Chronic atrial fibrillation: Secondary | ICD-10-CM | POA: Diagnosis not present

## 2017-03-15 DIAGNOSIS — E43 Unspecified severe protein-calorie malnutrition: Secondary | ICD-10-CM | POA: Diagnosis not present

## 2017-03-15 DIAGNOSIS — I5032 Chronic diastolic (congestive) heart failure: Secondary | ICD-10-CM | POA: Diagnosis not present

## 2017-03-15 DIAGNOSIS — I110 Hypertensive heart disease with heart failure: Secondary | ICD-10-CM | POA: Diagnosis not present

## 2017-03-16 ENCOUNTER — Telehealth: Payer: Self-pay

## 2017-03-16 NOTE — Telephone Encounter (Signed)
noted 

## 2017-03-16 NOTE — Telephone Encounter (Signed)
Copied from CRM [id number]. Topic: General - Other >> Mar 16, 2017 11:22 AM Arlyss Gandyichardson, Taren N, NT wrote: Reason for CRM: Patients daughter [name] is calling and states they would like Nicki ReaperRegina Baity to know this pt will no longer be seeing her as her PCP as the pt is now home bound and they have a dr coming in home to assist with pts care. Please stop all future med refills per daughter.

## 2017-03-17 DIAGNOSIS — I482 Chronic atrial fibrillation: Secondary | ICD-10-CM | POA: Diagnosis not present

## 2017-03-17 DIAGNOSIS — E43 Unspecified severe protein-calorie malnutrition: Secondary | ICD-10-CM | POA: Diagnosis not present

## 2017-03-17 DIAGNOSIS — J449 Chronic obstructive pulmonary disease, unspecified: Secondary | ICD-10-CM | POA: Diagnosis not present

## 2017-03-17 DIAGNOSIS — I5032 Chronic diastolic (congestive) heart failure: Secondary | ICD-10-CM | POA: Diagnosis not present

## 2017-03-17 DIAGNOSIS — I110 Hypertensive heart disease with heart failure: Secondary | ICD-10-CM | POA: Diagnosis not present

## 2017-03-17 DIAGNOSIS — I2510 Atherosclerotic heart disease of native coronary artery without angina pectoris: Secondary | ICD-10-CM | POA: Diagnosis not present

## 2017-03-21 DIAGNOSIS — I2510 Atherosclerotic heart disease of native coronary artery without angina pectoris: Secondary | ICD-10-CM | POA: Diagnosis not present

## 2017-03-21 DIAGNOSIS — J449 Chronic obstructive pulmonary disease, unspecified: Secondary | ICD-10-CM | POA: Diagnosis not present

## 2017-03-21 DIAGNOSIS — E43 Unspecified severe protein-calorie malnutrition: Secondary | ICD-10-CM | POA: Diagnosis not present

## 2017-03-21 DIAGNOSIS — I110 Hypertensive heart disease with heart failure: Secondary | ICD-10-CM | POA: Diagnosis not present

## 2017-03-21 DIAGNOSIS — K2270 Barrett's esophagus without dysplasia: Secondary | ICD-10-CM | POA: Diagnosis not present

## 2017-03-21 DIAGNOSIS — I5032 Chronic diastolic (congestive) heart failure: Secondary | ICD-10-CM | POA: Diagnosis not present

## 2017-03-21 DIAGNOSIS — L89152 Pressure ulcer of sacral region, stage 2: Secondary | ICD-10-CM | POA: Diagnosis not present

## 2017-03-21 DIAGNOSIS — F329 Major depressive disorder, single episode, unspecified: Secondary | ICD-10-CM | POA: Diagnosis not present

## 2017-03-21 DIAGNOSIS — F419 Anxiety disorder, unspecified: Secondary | ICD-10-CM | POA: Diagnosis not present

## 2017-03-21 DIAGNOSIS — I482 Chronic atrial fibrillation: Secondary | ICD-10-CM | POA: Diagnosis not present

## 2017-03-25 DIAGNOSIS — L89152 Pressure ulcer of sacral region, stage 2: Secondary | ICD-10-CM | POA: Diagnosis not present

## 2017-03-25 DIAGNOSIS — J449 Chronic obstructive pulmonary disease, unspecified: Secondary | ICD-10-CM | POA: Diagnosis not present

## 2017-03-25 DIAGNOSIS — I482 Chronic atrial fibrillation: Secondary | ICD-10-CM | POA: Diagnosis not present

## 2017-03-25 DIAGNOSIS — I5032 Chronic diastolic (congestive) heart failure: Secondary | ICD-10-CM | POA: Diagnosis not present

## 2017-03-25 DIAGNOSIS — I110 Hypertensive heart disease with heart failure: Secondary | ICD-10-CM | POA: Diagnosis not present

## 2017-03-25 DIAGNOSIS — Z79899 Other long term (current) drug therapy: Secondary | ICD-10-CM | POA: Diagnosis not present

## 2017-03-25 DIAGNOSIS — I2510 Atherosclerotic heart disease of native coronary artery without angina pectoris: Secondary | ICD-10-CM | POA: Diagnosis not present

## 2017-04-01 DIAGNOSIS — I110 Hypertensive heart disease with heart failure: Secondary | ICD-10-CM | POA: Diagnosis not present

## 2017-04-01 DIAGNOSIS — J449 Chronic obstructive pulmonary disease, unspecified: Secondary | ICD-10-CM | POA: Diagnosis not present

## 2017-04-01 DIAGNOSIS — I2510 Atherosclerotic heart disease of native coronary artery without angina pectoris: Secondary | ICD-10-CM | POA: Diagnosis not present

## 2017-04-01 DIAGNOSIS — L89152 Pressure ulcer of sacral region, stage 2: Secondary | ICD-10-CM | POA: Diagnosis not present

## 2017-04-01 DIAGNOSIS — I5032 Chronic diastolic (congestive) heart failure: Secondary | ICD-10-CM | POA: Diagnosis not present

## 2017-04-01 DIAGNOSIS — I482 Chronic atrial fibrillation: Secondary | ICD-10-CM | POA: Diagnosis not present

## 2017-04-06 DIAGNOSIS — J449 Chronic obstructive pulmonary disease, unspecified: Secondary | ICD-10-CM | POA: Diagnosis not present

## 2017-04-06 DIAGNOSIS — I110 Hypertensive heart disease with heart failure: Secondary | ICD-10-CM | POA: Diagnosis not present

## 2017-04-06 DIAGNOSIS — I5032 Chronic diastolic (congestive) heart failure: Secondary | ICD-10-CM | POA: Diagnosis not present

## 2017-04-06 DIAGNOSIS — I482 Chronic atrial fibrillation: Secondary | ICD-10-CM | POA: Diagnosis not present

## 2017-04-06 DIAGNOSIS — L89152 Pressure ulcer of sacral region, stage 2: Secondary | ICD-10-CM | POA: Diagnosis not present

## 2017-04-06 DIAGNOSIS — I2510 Atherosclerotic heart disease of native coronary artery without angina pectoris: Secondary | ICD-10-CM | POA: Diagnosis not present

## 2017-04-08 DIAGNOSIS — L8990 Pressure ulcer of unspecified site, unspecified stage: Secondary | ICD-10-CM | POA: Diagnosis not present

## 2017-04-08 DIAGNOSIS — G4700 Insomnia, unspecified: Secondary | ICD-10-CM | POA: Diagnosis not present

## 2017-04-08 DIAGNOSIS — K219 Gastro-esophageal reflux disease without esophagitis: Secondary | ICD-10-CM | POA: Diagnosis not present

## 2017-04-08 DIAGNOSIS — F341 Dysthymic disorder: Secondary | ICD-10-CM | POA: Diagnosis not present

## 2017-04-08 DIAGNOSIS — I482 Chronic atrial fibrillation: Secondary | ICD-10-CM | POA: Diagnosis not present

## 2017-04-15 DIAGNOSIS — I110 Hypertensive heart disease with heart failure: Secondary | ICD-10-CM | POA: Diagnosis not present

## 2017-04-15 DIAGNOSIS — I2510 Atherosclerotic heart disease of native coronary artery without angina pectoris: Secondary | ICD-10-CM | POA: Diagnosis not present

## 2017-04-15 DIAGNOSIS — L89152 Pressure ulcer of sacral region, stage 2: Secondary | ICD-10-CM | POA: Diagnosis not present

## 2017-04-15 DIAGNOSIS — I5032 Chronic diastolic (congestive) heart failure: Secondary | ICD-10-CM | POA: Diagnosis not present

## 2017-04-15 DIAGNOSIS — I482 Chronic atrial fibrillation: Secondary | ICD-10-CM | POA: Diagnosis not present

## 2017-04-15 DIAGNOSIS — J449 Chronic obstructive pulmonary disease, unspecified: Secondary | ICD-10-CM | POA: Diagnosis not present

## 2017-04-21 DIAGNOSIS — I2510 Atherosclerotic heart disease of native coronary artery without angina pectoris: Secondary | ICD-10-CM | POA: Diagnosis not present

## 2017-04-21 DIAGNOSIS — I5032 Chronic diastolic (congestive) heart failure: Secondary | ICD-10-CM | POA: Diagnosis not present

## 2017-04-21 DIAGNOSIS — I110 Hypertensive heart disease with heart failure: Secondary | ICD-10-CM | POA: Diagnosis not present

## 2017-04-21 DIAGNOSIS — L89152 Pressure ulcer of sacral region, stage 2: Secondary | ICD-10-CM | POA: Diagnosis not present

## 2017-04-21 DIAGNOSIS — I482 Chronic atrial fibrillation: Secondary | ICD-10-CM | POA: Diagnosis not present

## 2017-04-21 DIAGNOSIS — J449 Chronic obstructive pulmonary disease, unspecified: Secondary | ICD-10-CM | POA: Diagnosis not present

## 2017-04-23 DIAGNOSIS — I5032 Chronic diastolic (congestive) heart failure: Secondary | ICD-10-CM | POA: Diagnosis not present

## 2017-04-23 DIAGNOSIS — I110 Hypertensive heart disease with heart failure: Secondary | ICD-10-CM | POA: Diagnosis not present

## 2017-04-23 DIAGNOSIS — L89152 Pressure ulcer of sacral region, stage 2: Secondary | ICD-10-CM | POA: Diagnosis not present

## 2017-04-23 DIAGNOSIS — I482 Chronic atrial fibrillation: Secondary | ICD-10-CM | POA: Diagnosis not present

## 2017-04-23 DIAGNOSIS — J449 Chronic obstructive pulmonary disease, unspecified: Secondary | ICD-10-CM | POA: Diagnosis not present

## 2017-04-23 DIAGNOSIS — I2510 Atherosclerotic heart disease of native coronary artery without angina pectoris: Secondary | ICD-10-CM | POA: Diagnosis not present

## 2017-04-26 DIAGNOSIS — I5032 Chronic diastolic (congestive) heart failure: Secondary | ICD-10-CM | POA: Diagnosis not present

## 2017-04-26 DIAGNOSIS — J449 Chronic obstructive pulmonary disease, unspecified: Secondary | ICD-10-CM | POA: Diagnosis not present

## 2017-04-26 DIAGNOSIS — L89152 Pressure ulcer of sacral region, stage 2: Secondary | ICD-10-CM | POA: Diagnosis not present

## 2017-04-26 DIAGNOSIS — I482 Chronic atrial fibrillation: Secondary | ICD-10-CM | POA: Diagnosis not present

## 2017-04-26 DIAGNOSIS — I2510 Atherosclerotic heart disease of native coronary artery without angina pectoris: Secondary | ICD-10-CM | POA: Diagnosis not present

## 2017-04-26 DIAGNOSIS — I110 Hypertensive heart disease with heart failure: Secondary | ICD-10-CM | POA: Diagnosis not present

## 2017-04-29 DIAGNOSIS — J449 Chronic obstructive pulmonary disease, unspecified: Secondary | ICD-10-CM | POA: Diagnosis not present

## 2017-04-29 DIAGNOSIS — I2510 Atherosclerotic heart disease of native coronary artery without angina pectoris: Secondary | ICD-10-CM | POA: Diagnosis not present

## 2017-04-29 DIAGNOSIS — L89152 Pressure ulcer of sacral region, stage 2: Secondary | ICD-10-CM | POA: Diagnosis not present

## 2017-04-29 DIAGNOSIS — I482 Chronic atrial fibrillation: Secondary | ICD-10-CM | POA: Diagnosis not present

## 2017-04-29 DIAGNOSIS — I110 Hypertensive heart disease with heart failure: Secondary | ICD-10-CM | POA: Diagnosis not present

## 2017-04-29 DIAGNOSIS — I5032 Chronic diastolic (congestive) heart failure: Secondary | ICD-10-CM | POA: Diagnosis not present

## 2017-05-03 DIAGNOSIS — J449 Chronic obstructive pulmonary disease, unspecified: Secondary | ICD-10-CM | POA: Diagnosis not present

## 2017-05-03 DIAGNOSIS — I110 Hypertensive heart disease with heart failure: Secondary | ICD-10-CM | POA: Diagnosis not present

## 2017-05-03 DIAGNOSIS — I5032 Chronic diastolic (congestive) heart failure: Secondary | ICD-10-CM | POA: Diagnosis not present

## 2017-05-03 DIAGNOSIS — I2510 Atherosclerotic heart disease of native coronary artery without angina pectoris: Secondary | ICD-10-CM | POA: Diagnosis not present

## 2017-05-03 DIAGNOSIS — L89152 Pressure ulcer of sacral region, stage 2: Secondary | ICD-10-CM | POA: Diagnosis not present

## 2017-05-03 DIAGNOSIS — I482 Chronic atrial fibrillation: Secondary | ICD-10-CM | POA: Diagnosis not present

## 2017-05-06 DIAGNOSIS — F341 Dysthymic disorder: Secondary | ICD-10-CM | POA: Diagnosis not present

## 2017-05-06 DIAGNOSIS — I5032 Chronic diastolic (congestive) heart failure: Secondary | ICD-10-CM | POA: Diagnosis not present

## 2017-05-06 DIAGNOSIS — I482 Chronic atrial fibrillation: Secondary | ICD-10-CM | POA: Diagnosis not present

## 2017-05-06 DIAGNOSIS — L89322 Pressure ulcer of left buttock, stage 2: Secondary | ICD-10-CM | POA: Diagnosis not present

## 2017-05-06 DIAGNOSIS — J449 Chronic obstructive pulmonary disease, unspecified: Secondary | ICD-10-CM | POA: Diagnosis not present

## 2017-05-06 DIAGNOSIS — L89152 Pressure ulcer of sacral region, stage 2: Secondary | ICD-10-CM | POA: Diagnosis not present

## 2017-05-06 DIAGNOSIS — I2510 Atherosclerotic heart disease of native coronary artery without angina pectoris: Secondary | ICD-10-CM | POA: Diagnosis not present

## 2017-05-06 DIAGNOSIS — I110 Hypertensive heart disease with heart failure: Secondary | ICD-10-CM | POA: Diagnosis not present

## 2017-05-06 DIAGNOSIS — R05 Cough: Secondary | ICD-10-CM | POA: Diagnosis not present

## 2017-05-06 DIAGNOSIS — R062 Wheezing: Secondary | ICD-10-CM | POA: Diagnosis not present

## 2017-05-10 DIAGNOSIS — I2510 Atherosclerotic heart disease of native coronary artery without angina pectoris: Secondary | ICD-10-CM | POA: Diagnosis not present

## 2017-05-10 DIAGNOSIS — I5032 Chronic diastolic (congestive) heart failure: Secondary | ICD-10-CM | POA: Diagnosis not present

## 2017-05-10 DIAGNOSIS — L89152 Pressure ulcer of sacral region, stage 2: Secondary | ICD-10-CM | POA: Diagnosis not present

## 2017-05-10 DIAGNOSIS — J449 Chronic obstructive pulmonary disease, unspecified: Secondary | ICD-10-CM | POA: Diagnosis not present

## 2017-05-10 DIAGNOSIS — I482 Chronic atrial fibrillation: Secondary | ICD-10-CM | POA: Diagnosis not present

## 2017-05-10 DIAGNOSIS — I110 Hypertensive heart disease with heart failure: Secondary | ICD-10-CM | POA: Diagnosis not present

## 2017-05-21 DIAGNOSIS — 419620001 Death: Secondary | SNOMED CT | POA: Diagnosis not present

## 2017-05-21 DEATH — deceased

## 2017-07-22 ENCOUNTER — Encounter: Payer: Medicare Other | Admitting: Internal Medicine
